# Patient Record
Sex: Male | Born: 1945
Health system: Southern US, Community
[De-identification: ages and names within clinical notes are randomized; demographics above are authoritative.]

## PROBLEM LIST (undated history)

## (undated) DIAGNOSIS — I10 Essential (primary) hypertension: Secondary | ICD-10-CM

## (undated) DIAGNOSIS — M1990 Unspecified osteoarthritis, unspecified site: Secondary | ICD-10-CM

## (undated) DIAGNOSIS — E119 Type 2 diabetes mellitus without complications: Secondary | ICD-10-CM

## (undated) DIAGNOSIS — Z87442 Personal history of urinary calculi: Secondary | ICD-10-CM

## (undated) DIAGNOSIS — R001 Bradycardia, unspecified: Secondary | ICD-10-CM

## (undated) DIAGNOSIS — C61 Malignant neoplasm of prostate: Secondary | ICD-10-CM

## (undated) HISTORY — PX: COLONOSCOPY: SHX174

## (undated) HISTORY — DX: Malignant neoplasm of prostate: C61

## (undated) HISTORY — DX: Essential (primary) hypertension: I10

## (undated) HISTORY — PX: KIDNEY STONE SURGERY: SHX686

---

## 2006-08-27 ENCOUNTER — Ambulatory Visit: Payer: Self-pay | Admitting: Cardiology

## 2006-08-27 ENCOUNTER — Ambulatory Visit: Payer: Self-pay | Admitting: Internal Medicine

## 2006-08-27 ENCOUNTER — Inpatient Hospital Stay (HOSPITAL_COMMUNITY): Admission: EM | Admit: 2006-08-27 | Discharge: 2006-08-31 | Payer: Self-pay | Admitting: Emergency Medicine

## 2006-08-29 ENCOUNTER — Ambulatory Visit: Payer: Self-pay | Admitting: Vascular Surgery

## 2006-10-03 ENCOUNTER — Ambulatory Visit: Payer: Self-pay | Admitting: Internal Medicine

## 2006-10-07 ENCOUNTER — Ambulatory Visit: Payer: Self-pay | Admitting: Physician Assistant

## 2007-05-14 ENCOUNTER — Ambulatory Visit: Payer: Self-pay | Admitting: Cardiology

## 2007-06-05 ENCOUNTER — Ambulatory Visit: Payer: Self-pay | Admitting: Cardiology

## 2008-11-13 DIAGNOSIS — R55 Syncope and collapse: Secondary | ICD-10-CM

## 2008-11-13 DIAGNOSIS — I10 Essential (primary) hypertension: Secondary | ICD-10-CM | POA: Insufficient documentation

## 2008-11-13 DIAGNOSIS — E785 Hyperlipidemia, unspecified: Secondary | ICD-10-CM | POA: Insufficient documentation

## 2008-11-14 ENCOUNTER — Encounter (INDEPENDENT_AMBULATORY_CARE_PROVIDER_SITE_OTHER): Payer: Self-pay | Admitting: *Deleted

## 2010-08-04 NOTE — Consult Note (Signed)
NAME:  MASSIMILIANO, ROHLEDER NO.:  [id number]   MEDICAL RECORD NO.:  [id number]          PATIENT TYPE:  INP   LOCATION:  4702                         FACILITY:  [facility]   PHYSICIAN:  Duke Salvia, MD, FACCDATE OF BIRTH:  [birth year]   DATE OF CONSULTATION:  08/29/2006  DATE OF DISCHARGE:                                 CONSULTATION   Thank you very much for asking [place] to see Earlene Plater Gabrielson in consultation  because of syncope.   Mr. Cowans is a 65 year old gentleman who when he awakened on Friday  night/Saturday morning rolled over in bed.  At this point, he became  severely vertiginous.  He sat up without resolution of symptoms.  He  awakened his wife.  He laid back down and then, according to his wife,  he passed out.  She described him as being pasty white and extremely  diaphoretic.  He awakened and lost consciousness on 3 subsequent  occasions, during the interlude of one of which EMS was called.  On EMS  arrival, his heart rate was about 35 beats per minute and sinus.  The  blood pressure was recorded as 96/60 and a pulse was recorded at 30,  although I do not have the strips for that.  He was nauseated.  He was  given Phenergan, and his symptoms gradually resolved.   The patient had a similar episode of vertigo about a year and a half  ago, again awakening him from sleep, rolling over with severe symptoms.  He laid his head over the side of the bed for about 10 of 15 minutes and  then his symptoms gradually abated.   He also has a prior history of syncope.  About 6 months ago, he fell  taking a step off of a ladder, tripped on a tire and landed on his back,  hitting this against a chain saw which was actually off, but in the  vicinity of prior kidney surgery.  He walked in the house and became  syncopal.  He was described as extremely pale and diaphoretic, he was  nauseated and actually had a seizure in the context of this episode.   The patient has no other  history of syncope.   He has no problems with exercise tolerance.  He denies prior coronary  artery disease, myocardial infarction.   PAST MEDICAL HISTORY:  Largely negative.  He has a history of gout.   PAST SURGICAL HISTORY:  1. Back surgery.  2. Kidney stone surgery.   MEDICATIONS:  He takes no regular medications.   ALLERGIES:  He is allergic to DILAUDID.  This allergy describes loss of  consciousness with intravenous administration.   SOCIAL HISTORY:  He is retired.  He lives with his wife.  He does not  use cigarettes or alcohol.   FAMILY HISTORY:  Notable for some with a pacemaker.   REVIEW OF SYSTEMS:  Noncontributory.   PHYSICAL EXAMINATION:  GENERAL:  He is an elderly Caucasian male  appearing his stated age of 65.  VITAL SIGNS:  His blood pressure is 140/91.  His pulse was 55.  He is  obese; weight is not, however, recorded.  HEENT:  No icterus, no xanthoma.  NECK:  The neck veins were flat.  The carotids were brisk and full  bilaterally without bruits.  BACK:  Without kyphosis or scoliosis.  LUNGS:  Clear.  HEART:  Heart sounds were regular without murmurs or gallops.  ABDOMEN:  Protuberant and soft.  Femoral pulses were not examined.  Distal pulses were intact with no clubbing, cyanosis or edema.  NEUROLOGICAL:  Exam was grossly normal, although he did have some  lateral gaze nystagmus.  SKIN:  Warm and dry.   ELECTROCARDIOGRAM:  Electrocardiogram dated August 28, 2006 demonstrated  sinus rhythm at 56 with intervals of 0.16, 0.09, 0.43.  The axis was 35  degrees.   The strips from the EMS are as recorded previously.   IMPRESSION:  1. Syncope associated with bradycardia, hypertension, nausea and      diaphoresis, likely neurally mediated.  2. Vertigo preceding #1.  3. Syncopal seizure episode in the fall in the setting of acute pain.  4. Nystagmus.   Mr. Clugston has vertigo followed by syncope.  The story sounds as if it  is positional vertigo with the  antecedent episode a year and a half ago.  I am not familiar with BPV to know whether this relative infrequency is  consistent with that diagnosis, although the history certainly suggests  this.  Further literature review this afternoon has failed to identify  papers which describe syncope as a concomitant occurrence with this type  of vertigo.  This leaves me a little bit uncertain as to the right  diagnosis, and I have asked neurology to come and assist and help [place]  sort out whether this is a peripheral or more central vertiginous  episode and whether that will help [place] understand the association of  these 2 occurrences.   For right now, we will (1) await neurological input, (2) the patient may  need an event recorder.   Thank you for the consultation.      Duke Salvia, MD, [facility]  Electronically Signed     SCK/MEDQ  D:  08/29/2006  T:  08/30/2006  Job:  [id number]   cc:   [facility] Internal Medicine  [facility]
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
## 2010-08-04 NOTE — Consult Note (Signed)
NAME:  Marcus Simpson, PASSAGE NO.:  [id number]   MEDICAL RECORD NO.:  [id number]          PATIENT TYPE:  INP   LOCATION:  4702                         FACILITY:  [facility]   PHYSICIAN:  Pramod P. Pearlean Brownie, MD    DATE OF BIRTH:  [birth year]   DATE OF CONSULTATION:  DATE OF DISCHARGE:                                 CONSULTATION   REASON FOR REFERRAL:  Vertigo and syncope.   HISTORY OF PRESENT ILLNESS:  Marcus Simpson is a 65-year Caucasian male who  had three brief episodes of loss of consciousness in the setting of  severe vertigo last Saturday.  The patient woke up from sleep and he was  lying on the left side and when he turned to the right he noticed severe  vertigo with nausea and he turned his head back to the left and passed  out briefly.  He was able to get up for a few seconds and vertigo was  gone.  He woke up his wife, but as he sat up and again turned his head  to the right, he again been brief loss of consciousness.  This happened  about three times and was related to head turning to the right on all  occasions.  This was associated with nausea.  He even threw up on the  way to the hospital.  His heart rate was found to be low in the 30s.  He  was treated with Phenergan and atropine, and the heart rate improved.  His nausea also improved.  He has remained stable since then and has had  no further episodes of vertigo, nausea or vomiting.  Denies any focal  extremity weakness, gait, balance problems, loss of vision or double  vision.  There is no prior known history of stroke, TIA or seizures.  He, however, does admit to a similar episode of positional vertigo about  the year ago which was not as severe.  He does have decreased hearing  bilaterally, but denies any tinnitus, recent ear infection, trauma or  injury or surgery.   PAST MEDICAL HISTORY:  1. Renal stones.  2. Back injury.   FAMILY HISTORY:  Positive for stroke in the father.  Several family  members have  history of cardiac arrhythmia and pacemaker.   ALLERGIES TO MEDICATIONS:  DILAUDID.   HOME MEDICATIONS:  None.   SOCIAL HISTORY:  He lives at home with his wife.  He used to smoke one  pack per day for 20 years and quit 25 years ago.   REVIEW OF SYSTEMS:  Not significant for recent fever, cough, chest pain,  shortness of breath, diarrhea or other illness.   PHYSICAL EXAMINATION:  GENERAL:  Reveals a pleasant middle-aged  Caucasian male who is at present not in distress.  VITAL SIGNS:  Afebrile, temperature 98, pulse rate 58 per minute,  regular sinus, respiratory 20, blood pressure 140/91.  Distal pulses  well felt.  HEENT:  Head is nontraumatic.  NECK:  Supple without bruit.  ENT :  Unremarkable.  CARDIAC:  No murmur or gallop.  LUNGS:  Clear to auscultation.  ABDOMEN:  Soft, nontender.  NEUROLOGIC:  The patient is awake, alert, oriented x3 with normal speech  and language function is no aphasia, plus or dysarthria.  Pupils equal  reactive.  Eye movements are full range without nystagmus.  Visual  fields adequate.  Face is symmetric.  Palatal movements normal.  Tongue  is midline.  Motor system exam reveals no upper extremity drift.  Deep  tendon reflexes are 1+ in upper extremities and depressed in lower  extremities.  Plantars are downgoing.  No sensory loss.  He has intact  finger-to-nose coordination.  He can walk with a steady gait including  tandem walking.   STUDIES:  On the head shaking test, he tested positive for dizziness,  but no objective nystagmus was seen.  Focal __________ stepping test was  positive with the patient clearly moving off base.  Hallpike maneuver  was not done.  Data reviewed.  CT scan of the head noncontrast study is  unremarkable without active abnormality.  Carotid ultrasound shows no  significant extracranial stenosis.  Lipid profile, hemoglobin A1c are  pending at this time.   IMPRESSION:  In 65 year old gentleman with recurrent episodes of  brief  loss of consciousness in the setting of severe positional vertigo.   DIFFERENTIAL DIAGNOSIS:  Vertebral basilar insufficiency versus severe  vertigo of labyrinth etiology.   PLAN:  I would recommend further evaluation with checking MRI scan of  the brain with MRA of the brain and neck.  Patient is extremely  claustrophobic and so we will sedate him prior to this.  Check  transcranial Doppler studies, fasting lipid profile, hemoglobin A1c and  homocysteine.  Vestibular stabilization exercises by physical therapy.  I will be happy to follow the patient in consult.  Kindly call for  questions.  I had a long discussion with the patient and his wife  regarding above evaluation, treatment and  answered questions.  Thank  you for this referral.           ______________________________  Sunny Schlein. Pearlean Brownie, MD     PPS/MEDQ  D:  08/29/2006  T:  08/30/2006  Job:  [id number]

## 2010-08-04 NOTE — Assessment & Plan Note (Signed)
[facility]                          [facility] OFFICE NOTE   Marcus Simpson, Marcus Simpson                    MRN:          [id number]  DATE:10/07/2006                            DOB:          01-Jan-1946    CARDIOLOGIST:  He will be new to Dr. Andee Lineman.   PRIMARY CARE PHYSICIAN:  Dr. Weyman Pedro.   HISTORY OF PRESENT ILLNESS:  Marcus Simpson is a very pleasant 65 year old  male patient who was seen recently by our team at [facility] in  [place] for syncope.  The patient had an extensive workup, including  evaluation by electrophysiology with Dr. Sherryl Manges as well as Dr.  Delia Heady for neurology.  He had an MRI/MRA of the head and neck.  He  also had carotid Dopplers.  He had an echocardiogram.  The  echocardiogram did reveal normal LV function with an EF of 60-65%.  His  MRI/MRA were both normal.  His carotid Dopplers revealed normal  vertebral flow as well as no ICA stenosis.   His EEG was also normal.   His blood work was normal.  He ruled out for myocardial infarction by  enzymes.  His lipid panel was optimal except for a low HDL of 27.  His  TSH was normal at 0.779.   He was set up for an event monitor as an outpatient.   According to the records I have received, it appears as though it was  hypothesized that Marcus Simpson had a severe episode of vertigo that  prompted him to pass out.  It seems as though this was a neurally  mediated event.  He was placed on Ramipril for high blood pressure,  which is a new diagnosis.  He was also placed on niacin for his low HDL.  He was apparently placed on several home exercises for habituation for  his vertigo.  He returns today for followup.  Upon review of his event  monitor, no significant brady arrhythmias have been noted.  No pauses  have been noted.  He has essentially been in sinus rhythm, sinus  tachycardia, and sinus bradycardia.  He denies any recurrent symptoms of  syncope.  He  denies any near syncope or dizziness.  He denies any chest  pain or shortness of breath.  He denies any palpitations.   MEDICATIONS:  1. Ramipril 2.5 mg daily.  2. Multivitamin daily.  3. Aspirin 81 mg daily.  4. Niacin.  5. Meclizine 25 mg p.r.n.  6. Fish oil.  7. B12.   PHYSICAL EXAMINATION:  He is a well-developed and well-nourished male in  no acute distress.  Blood pressure is 140/85 with a pulse of 57.  Weight 217 pounds.  HEENT:  Normal.  NECK:  Without JVD.  CARDIAC:  Normal S1 and S2.  Regular rate and rhythm.  LUNGS:  Clear to auscultation bilaterally.  ABDOMEN:  Soft and nontender.  EXTREMITIES:  Calves are soft and nontender.  NEUROLOGIC:  He is alert and oriented x3.  Cranial nerves II-XII are  grossly intact.   Electrocardiogram reveals a sinus bradycardia with a heart rate  of 54.  Normal axis.  No acute changes.   IMPRESSION:  1. A recent history of syncope.      a.     Most likely neurally mediated in the setting of severe       positional vertigo.  2. Sinus bradycardia.  3. Good left ventricular function.  4. Hypertension.  5. Family history of coronary artery disease.  6. Ex-smoker.  7. Dyslipidemia with low HDL.   PLAN:  Patient presents to the office today for followup on syncope.  All of his testing thus far has been fairly unremarkable.  It appears  that his most likely diagnosis is severe positional vertigo that  prompted a neurally mediated event.  His event monitor thus far has  shown no brady arrhythmias.  He will continue to finish out his event  monitor.  He does have some risk factors for coronary artery disease.  We will set him up for a routine exercise treadmill test to screen for  ischemic heart disease as well as to assess his chronotropic competence.  He will be seen back in this office in the next 4-6 weeks for routine  followup.  If he has any recurrent episodes of syncope or near syncope,  he is to notify us, and we will see him  back sooner.      Tereso Newcomer, PA-C  Electronically Signed      Learta Codding, MD,FACC  Electronically Signed   SW/MedQ  DD: 10/07/2006  DT: 10/07/2006  Job #: [id number]   cc:   Doreen Beam

## 2010-08-04 NOTE — Assessment & Plan Note (Signed)
[facility]                          [facility] NOTE   Marcus Simpson, Marcus Simpson                    MRN:          [id number]  DATE:06/05/2007                            DOB:          [birth year]    PRIMARY CARDIOLOGIST:  Dr. Lewayne Bunting.   REASON FOR VISIT:  Post hospital follow-up.   The patient returns to the clinic after recent brief hospitalization  here at [facility].  He was referred to [place] in consultation for evaluation  of near syncope.  This was in the setting of severe gastroenteritis,  with associated nausea, vomiting, diarrhea.  Of note, he also presented  with prior history of syncope, but this was felt to be secondary to  severe positional vertigo. Our records indicated that he had had a  negative extensive workup at [facility] in June 2008.   The patient also had known preserved left ventricular function by prior  echocardiography. Our recommendations, therefore, were to manage  conservatively, with admonition to not drive for 30 days, and to  consider further evaluation with an implantable loop recorder, in the  event that he were to have recurrent syncope.   The patient returns to clinic today reporting no recurrent symptoms of  dizziness.  Although he has meclizine to be used p.r.n. for vertigo, he  has not even had to use this.  He also seems to feel that this was all  secondary to his severe gastroenteritis.   The patient has no known history of coronary artery disease and denies  any exertional angina pectoris or dyspnea.  He does have history of  mixed dyslipidemia, with low HDL, and has not had a study done since  July 2008.  At that time, his HDL was 27 with a total cholesterol of  150, triglycerides of 120 and LDL of 99.   Patient's previous ischemic workup was a negative adequate GXT  Cardiolite in 2004.   CURRENT MEDICATIONS:  Aspirin 81 daily, Norvasc 5 daily, , fish oil 1000  daily.   PHYSICAL EXAM:  Blood  pressure 144/83, pulse 69, regular weight 220.  GENERAL:  65 year old male sitting upright in no distress.  HEENT:  Normocephalic, atraumatic.  NECK:  Palpable bilateral pulses, without bruits; no JVD.  LUNGS:  Clear to auscultation all fields.  HEART:  Regular rhythm (S1, S2), no significant murmurs  ABDOMEN:  Soft, nontender.  EXTREMITIES:  Palpable pulses without edema.  NEURO:  No focal deficit.   IMPRESSION:  1. Status post near syncope.      a.     In setting of severe gastroenteritis.  2. History of syncope      a.     Felt secondary to severe positional vertigo      b.     Negative extensive workup at [facility], June 2008.  3. Preserved left ventricular function.  4. Dyslipidemia/low HDL, on fish oil.  5. Hypertension.  6. Elevated liver enzymes.      a.     Most likely secondary to gastroenteritis.   PLAN:  1. Follow-up blood work with a surveillance fasting  lipid profile, as      well as a repeat CMET  for reassessment of elevated liver enzymes.  2. Continue current medication regimen for now.  I did, however,      consider the possibility of increasing Norvasc to 10 mg daily for      better blood pressure control.  For the time-being, however, we      will continue closely monitoring this and reconsider this when the      patient returns for scheduled follow-up in 3 months.      Gene Serpe, PA-C  Electronically Signed      Learta Codding, MD,FACC  Electronically Signed   GS/MedQ  DD: 06/05/2007  DT: 06/05/2007  Job #: [id number]   cc:   Doreen Beam, MD

## 2010-08-04 NOTE — Procedures (Signed)
EEG NUMBER:  [id number]   INDICATION FOR PROCEDURE:  This is an [facility] patient referred for  this EEG due to repeated episodes of extreme dizziness, weakness and  vomiting as well as passing out for about 30 seconds.  This was  described as being the 4th episode of syncope/possible seizure.   TECHNICAL PROTOCOL:  This is a routine study performed with a non-  portable unit; it includes a photic stimulation maneuver.  No  hyperventilation maneuver was performed.  The patient is described as  right-handed, awake and drowsy.   MEDICATIONS:  Lovenox, Protonix, Phenergan, Ambien, Reglan, meclizine  and Ventolin.   TECHNICAL COMPONENT:  A 16-channel EEG recording was performed with 1  channel representing heart rate and rhythm exclusively.  The EEG is  reviewed in different montages.   DESCRIPTION:  A 9-Hz posterior-dominant rhythm emits bilaterally  symmetrically from both posterior hemispheres and is promptly attenuated  with eye opening.  The EKG remains in normal sinus rhythm throughout  this recording.  There is little motion artifact noted.  Photic  stimulation as performed showed flash rates of 15 and 17 Hz did lead to  photic entrainment, again without epileptiform discharges.  Hyperventilation was deferred.  The patient, who was awake at the  beginning of the recording, became progressively drowsy as the recording  went on; this was indicated by central and temporal increased amplitude  and slowing.  The patient seemed to have reached non-REM sleep stage I  for a period of time.  No epileptiform activity was noticed, no focal  slowing was seen and photic stimulation did not lead to epileptiform  discharges.   CONCLUSION:  This was a normal EEG for the patient's conscious state and  age.      Melvyn Novas, M.D.  Electronically Signed     HQ:IONG  D:  08/30/2006 16:39:52  T:  08/31/2006 08:58:38  Job #:  [id number]   cc:   [facility]

## 2011-01-07 LAB — BASIC METABOLIC PANEL
BUN: 12
Calcium: 8.9
Creatinine, Ser: 1.08
GFR calc Af Amer: >60

## 2011-01-07 LAB — CBC
HCT: 38.8 — ABNORMAL LOW
Hemoglobin: 13.5
MCHC: 34.7
MCV: 90.9
Platelets: 232
RBC: 4.27
RDW: 12.7
WBC: 10.7 — ABNORMAL HIGH

## 2011-01-07 LAB — CARDIAC PANEL(CRET KIN+CKTOT+MB+TROPI)
CK, MB: 1.4
CK, MB: 1.8
Relative Index: 1.7
Relative Index: INVALID
Total CK: 109
Total CK: 89
Troponin I: 0.01
Troponin I: 0.02

## 2011-01-07 LAB — DIFFERENTIAL
Basophils Absolute: 0
Basophils Relative: 0
Eosinophils Absolute: 0.2
Eosinophils Relative: 2
Lymphocytes Relative: 14
Lymphs Abs: 1.4
Monocytes Absolute: 0.8 — ABNORMAL HIGH
Monocytes Relative: 8
Neutro Abs: 8.2 — ABNORMAL HIGH
Neutrophils Relative %: 77

## 2011-01-07 LAB — PROTIME-INR
INR: 1
Prothrombin Time: 13.8

## 2011-01-07 LAB — POCT CARDIAC MARKERS
CKMB, poc: 1.1
CKMB, poc: 1.2
CKMB, poc: 1.6
Myoglobin, poc: 73.1
Myoglobin, poc: 88.6
Myoglobin, poc: 94.3
Operator id: 284251
Operator id: 284251
Operator id: 284251
Troponin i, poc: <0.05
Troponin i, poc: <0.05
Troponin i, poc: <0.05

## 2011-01-07 LAB — BASIC METABOLIC PANEL WITH GFR
CO2: 25
Chloride: 107
GFR calc non Af Amer: >60
Glucose, Bld: 128 — ABNORMAL HIGH
Potassium: 3.6
Sodium: 139

## 2011-01-07 LAB — LIPID PANEL
Cholesterol: 150
HDL: 27 — ABNORMAL LOW
LDL Cholesterol: 99
Total CHOL/HDL Ratio: 5.6
Triglycerides: 120
VLDL: 24

## 2011-01-07 LAB — CK TOTAL AND CKMB (NOT AT ARMC)
CK, MB: 2.2
Relative Index: 1.7
Total CK: 127

## 2011-01-07 LAB — HEMOGLOBIN A1C: Hgb A1c MFr Bld: 6.1

## 2011-01-07 LAB — APTT: aPTT: 26

## 2011-01-07 LAB — TSH: TSH: 0.779

## 2011-01-07 LAB — TROPONIN I: Troponin I: 0.01

## 2014-12-10 ENCOUNTER — Ambulatory Visit (INDEPENDENT_AMBULATORY_CARE_PROVIDER_SITE_OTHER): Payer: Medicare Other | Admitting: Urology

## 2014-12-10 DIAGNOSIS — R972 Elevated prostate specific antigen [PSA]: Secondary | ICD-10-CM

## 2014-12-10 DIAGNOSIS — N200 Calculus of kidney: Secondary | ICD-10-CM

## 2014-12-17 ENCOUNTER — Other Ambulatory Visit: Payer: Self-pay | Admitting: Urology

## 2014-12-17 DIAGNOSIS — R972 Elevated prostate specific antigen [PSA]: Secondary | ICD-10-CM

## 2014-12-24 ENCOUNTER — Ambulatory Visit (HOSPITAL_COMMUNITY)
Admission: RE | Admit: 2014-12-24 | Discharge: 2014-12-24 | Disposition: A | Discharge status: Home / Self Care | Payer: Medicare Other | Source: Ambulatory Visit | Attending: Urology | Admitting: Urology

## 2014-12-24 DIAGNOSIS — Z8042 Family history of malignant neoplasm of prostate: Secondary | ICD-10-CM | POA: Diagnosis not present

## 2014-12-24 DIAGNOSIS — C61 Malignant neoplasm of prostate: Secondary | ICD-10-CM | POA: Diagnosis not present

## 2014-12-24 DIAGNOSIS — R972 Elevated prostate specific antigen [PSA]: Secondary | ICD-10-CM

## 2014-12-24 MED ORDER — GENTAMICIN SULFATE 40 MG/ML IJ SOLN
INTRAMUSCULAR | Status: AC
  Administered 2014-12-24: 160 mg via INTRAMUSCULAR
  Filled 2014-12-24: qty 4

## 2014-12-24 MED ORDER — LIDOCAINE HCL (PF) 2 % IJ SOLN
INTRAMUSCULAR | Status: AC
  Administered 2014-12-24: 10 mL
  Filled 2014-12-24: qty 10

## 2014-12-24 MED ORDER — GENTAMICIN SULFATE 40 MG/ML IJ SOLN
160.0000 mg | Freq: Once | INTRAMUSCULAR | Status: AC
  Administered 2014-12-24: 160 mg via INTRAMUSCULAR

## 2014-12-24 MED ORDER — LIDOCAINE HCL (PF) 2 % IJ SOLN
10.0000 mL | Freq: Once | INTRAMUSCULAR | Status: AC
  Administered 2014-12-24: 10 mL

## 2014-12-24 NOTE — Discharge Instructions (Signed)
Transrectal Ultrasound-Guided Biopsy °A transrectal ultrasound-guided biopsy is a procedure to remove samples of tissue from your prostate using ultrasound images to guide the procedure. The procedure is usually done to evaluate the prostate gland of men who have an elevated prostate-specific antigen (PSA). PSA is a blood test to screen for prostate cancer. The biopsy samples are taken to check for prostate cancer.  °LET YOUR HEALTH CARE PROVIDER KNOW ABOUT: °· Any allergies you have. °· All medicines you are taking, including vitamins, herbs, eye drops, creams, and over-the-counter medicines. °· Previous problems you or members of your family have had with the use of anesthetics. °· Any blood disorders you have. °· Previous surgeries you have had. °· Medical conditions you have. °RISKS AND COMPLICATIONS °Generally, this is a safe procedure. However, as with any procedure, problems can occur. Possible problems include: °· Infection of your prostate. °· Bleeding from your rectum or blood in your urine. °· Difficulty urinating. °· Nerve damage (this is usually temporary). °· Damage to surrounding structures such as blood vessels, organs, and muscles, which would require other procedures. °BEFORE THE PROCEDURE °· Do not eat or drink anything after midnight on the night before the procedure or as directed by your health care provider. °· Take medicines only as directed by your health care provider. °· Your health care provider may have you stop taking certain medicines 5-7 days before the procedure. °· You will be given an enema before the procedure. During an enema, a liquid is injected into your rectum to clear out waste. °· You may have lab tests the day of your procedure.   °· Plan to have someone take you home after the procedure. °PROCEDURE  °· You will be given medicine to help you relax (sedative) before the procedure. An IV tube will be inserted into one of your veins and used to give fluids and  medicine. °· You will be given antibiotic medicine to reduce the risk of an infection. °· You will be placed on your side for the procedure. °· A probe with lubricated gel will be placed into your rectum, and images will be taken of your prostate and surrounding structures. °· Numbing medicine will be injected into the prostate before the biopsy samples are taken. °· A biopsy needle will then be inserted and guided to your prostate with the use of the ultrasound images. °· Samples of prostate tissue will be taken, and the needle will then be removed. °· The biopsy samples will be sent to a lab to be analyzed. Results are usually back in 2-3 days. °AFTER THE PROCEDURE °· You will be taken to a recovery area where you will be monitored. °· You may have some discomfort in the rectal area. You will be given pain medicines to control this. °· You may be allowed to go home the same day, or you may need to stay in the hospital overnight. °Document Released: 07/23/2013 Document Reviewed: 10/25/2012 °Exit[employer]® Patient Information ©2015 [employer]. This information is not intended to replace advice given to you by your health care provider. Make sure you discuss any questions you have with your health care provider. ° °

## 2015-02-26 ENCOUNTER — Encounter (INDEPENDENT_AMBULATORY_CARE_PROVIDER_SITE_OTHER): Payer: Self-pay | Admitting: *Deleted

## 2015-03-10 ENCOUNTER — Other Ambulatory Visit (INDEPENDENT_AMBULATORY_CARE_PROVIDER_SITE_OTHER): Payer: Self-pay | Admitting: Internal Medicine

## 2015-03-10 ENCOUNTER — Encounter (INDEPENDENT_AMBULATORY_CARE_PROVIDER_SITE_OTHER): Payer: Self-pay

## 2015-03-10 ENCOUNTER — Ambulatory Visit (INDEPENDENT_AMBULATORY_CARE_PROVIDER_SITE_OTHER): Payer: Medicare Other | Admitting: Internal Medicine

## 2015-03-10 ENCOUNTER — Encounter (INDEPENDENT_AMBULATORY_CARE_PROVIDER_SITE_OTHER): Payer: Self-pay | Admitting: Internal Medicine

## 2015-03-10 ENCOUNTER — Encounter (INDEPENDENT_AMBULATORY_CARE_PROVIDER_SITE_OTHER): Payer: Self-pay | Admitting: *Deleted

## 2015-03-10 VITALS — BP 112/60 | HR 64 | Temp 98.7°F | Ht 71.0 in | Wt 206.1 lb

## 2015-03-10 DIAGNOSIS — C61 Malignant neoplasm of prostate: Secondary | ICD-10-CM | POA: Insufficient documentation

## 2015-03-10 DIAGNOSIS — R197 Diarrhea, unspecified: Secondary | ICD-10-CM | POA: Diagnosis not present

## 2015-03-10 NOTE — Patient Instructions (Signed)
Imodium one in am and one in the pm. May take up to three times a day. May need a sigmoidoscopy if stool studies are negative.

## 2015-03-10 NOTE — Progress Notes (Signed)
   Subjective:    Patient ID: Marcus Simpson, male    DOB: Oct 10, 1945, 69 y.o.   MRN: [id number]  HPIReferred to our office by Dr. Quintin Alto for chronic diarrhea. He  Apparently has had diarrhea x 4 months. He is having 7-8 stools a day. He says as soon as he eats, he has to go to the [place]. There has been no rectal bleeding. Stools are always loose. Up until 4 months ago, his stools were normal. He has lost about 15 pounds over a 4 month period. Recently had a colonoscopy with Dr. Ladona Horns (general surgery). Appetite has been okay. There is no abdominal pain.  No rectal pain.  He tells me stools studies were negative. (Stool studies received and are negative 11/26/2014)  02/06/2015 Dr. Ladona Horns. Chronic diarrhea: colonoscopy: Two polyps were found in the rectum. Two polyps were found in the rt colon.  Colon biopsy 120 cm: localized adenomatous change in colonic epithelium. Colon 130 cm: tubular adenoma. Colon rectum biopsy: hyperplastic polyp.   New diagnosis of prostate cancer in October. He has not had radiation or hormonal therapy  01/17/2015 WBC 7.8, H and H 13.7 and 40.8, MCV 91, glucose 113, BUn 10, Creatinine 1.01, total  bili 1.4, LP 94, AST 22, ALT 15.  11/24/2014 CT abdomen/pelvis with CM: abdominal pain and diarrhea: Stable stranding and small lymph nodes at the base of the small bowel mesenteries. Similar to prior study. No focal small bowel lesion or obstruction. Multiple bilateral rena calculi are again noted without obstruction. Stable appearance of bilateral renal cysts. Advanced degenerative changes in the lumbar spine are stable.    Review of Systems Past Medical History  Diagnosis Date  . Hypertension   . Prostate cancer [facility])     Past Surgical History  Procedure Laterality Date  . Kidney stone surgery      Allergies  Allergen Reactions  . Dilaudid [Hydromorphone Hcl] Anaphylaxis    No current outpatient prescriptions on file prior to visit.   No current  facility-administered medications on file prior to visit.   Current Outpatient Prescriptions  Medication Sig Dispense Refill  . amLODipine (NORVASC) 5 MG tablet Take 5 mg by mouth daily.    Marland Kitchen aspirin 81 MG tablet Take 81 mg by mouth daily.    . Multiple Vitamin (MULTIVITAMIN) tablet Take 1 tablet by mouth daily.    . vitamin B-12 (CYANOCOBALAMIN) 1000 MCG tablet Take 500 mcg by mouth daily.     No current facility-administered medications for this visit.        Objective:   Physical Exam Blood pressure 112/60, pulse 64, temperature 98.7 F (37.1 C), height 5\' 11"  (1.803 m), weight 206 lb 1.6 oz (93.486 kg). Alert and oriented. Skin warm and dry. Oral mucosa is moist.   . Sclera anicteric, conjunctivae is pink. Thyroid not enlarged. No cervical lymphadenopathy. Lungs clear. Heart regular rate and rhythm.  Abdomen is soft. Bowel sounds are positive. No hepatomegaly. No abdominal masses felt. No tenderness.  No edema to lower extremities.        Assessment & Plan:  Chronic diarrhea.  Am going to get stool studies from Dr. Quintin Alto. ( I discussed with Dr. Laural Golden).  Imodium twice a day. May take up to three a day. Sigmoidoscopy.  Stool studies were negative.

## 2015-03-31 DIAGNOSIS — J019 Acute sinusitis, unspecified: Secondary | ICD-10-CM | POA: Diagnosis not present

## 2015-04-01 ENCOUNTER — Other Ambulatory Visit (INDEPENDENT_AMBULATORY_CARE_PROVIDER_SITE_OTHER): Payer: Self-pay | Admitting: Internal Medicine

## 2015-04-01 DIAGNOSIS — R197 Diarrhea, unspecified: Secondary | ICD-10-CM

## 2015-04-02 ENCOUNTER — Encounter (HOSPITAL_COMMUNITY): Payer: Self-pay | Admitting: *Deleted

## 2015-04-02 ENCOUNTER — Encounter (HOSPITAL_COMMUNITY)
Admission: RE | Disposition: A | Discharge status: Home Health | Payer: Self-pay | Source: Ambulatory Visit | Attending: Internal Medicine

## 2015-04-02 ENCOUNTER — Ambulatory Visit (HOSPITAL_COMMUNITY)
Admission: RE | Admit: 2015-04-02 | Discharge: 2015-04-02 | Disposition: A | Discharge status: Home Health | Payer: Medicare Other | Source: Ambulatory Visit | Attending: Internal Medicine | Admitting: Internal Medicine

## 2015-04-02 DIAGNOSIS — Z86010 Personal history of colonic polyps: Secondary | ICD-10-CM | POA: Insufficient documentation

## 2015-04-02 DIAGNOSIS — Z79899 Other long term (current) drug therapy: Secondary | ICD-10-CM | POA: Diagnosis not present

## 2015-04-02 DIAGNOSIS — Z7982 Long term (current) use of aspirin: Secondary | ICD-10-CM | POA: Insufficient documentation

## 2015-04-02 DIAGNOSIS — I10 Essential (primary) hypertension: Secondary | ICD-10-CM | POA: Insufficient documentation

## 2015-04-02 DIAGNOSIS — R197 Diarrhea, unspecified: Secondary | ICD-10-CM | POA: Diagnosis not present

## 2015-04-02 DIAGNOSIS — K644 Residual hemorrhoidal skin tags: Secondary | ICD-10-CM | POA: Diagnosis not present

## 2015-04-02 DIAGNOSIS — K6389 Other specified diseases of intestine: Secondary | ICD-10-CM | POA: Diagnosis not present

## 2015-04-02 DIAGNOSIS — K649 Unspecified hemorrhoids: Secondary | ICD-10-CM | POA: Diagnosis not present

## 2015-04-02 DIAGNOSIS — C61 Malignant neoplasm of prostate: Secondary | ICD-10-CM | POA: Insufficient documentation

## 2015-04-02 HISTORY — PX: FLEXIBLE SIGMOIDOSCOPY: SHX5431

## 2015-04-02 SURGERY — SIGMOIDOSCOPY, FLEXIBLE
Anesthesia: Moderate Sedation

## 2015-04-02 MED ORDER — MEPERIDINE HCL 25 MG/ML IJ SOLN
INTRAMUSCULAR | Status: DC | PRN
  Administered 2015-04-02 (×2): 25 mg via INTRAVENOUS

## 2015-04-02 MED ORDER — STERILE WATER FOR IRRIGATION IR SOLN
Status: DC | PRN
  Administered 2015-04-02: 09:00:00

## 2015-04-02 MED ORDER — MIDAZOLAM HCL 5 MG/5ML IJ SOLN
INTRAMUSCULAR | Status: DC | PRN
  Administered 2015-04-02 (×2): 2 mg via INTRAVENOUS

## 2015-04-02 MED ORDER — MIDAZOLAM HCL 5 MG/5ML IJ SOLN
INTRAMUSCULAR | Status: AC
  Filled 2015-04-02: qty 10

## 2015-04-02 MED ORDER — MEPERIDINE HCL 50 MG/ML IJ SOLN
INTRAMUSCULAR | Status: AC
  Filled 2015-04-02: qty 1

## 2015-04-02 MED ORDER — LOPERAMIDE HCL 2 MG PO CAPS: 2.0000 mg | ORAL_CAPSULE | Freq: Two times a day (BID) | ORAL | Status: DC

## 2015-04-02 MED ORDER — SODIUM CHLORIDE 0.9 % IV SOLN
INTRAVENOUS | Status: DC
  Administered 2015-04-02: 1000 mL via INTRAVENOUS

## 2015-04-02 NOTE — H&P (Signed)
Marcus Simpson is an 70 y.o. male.   Chief Complaint: Patient is here for flexible sigmoidoscopy. HPI: Patient is 70 year old Caucasian male who presents with 5 month history of diarrhea. He is having as many as 7 to 8 stools per day. All of his stools are loose. They're moderate in volume. He denies cramping melena or rectal bleeding. He has had multiple accidents. He is also having nocturnal bowel movements. He has lost 25 pounds since diarrhea started. [facility] and these were [facility]. He had colonoscopy on 02/06/2015 by Dr. Ladona Horns and 2 small polyps removed. One polyp was tubular adenoma in the rectal polyp was hyperplastic. There is no history of recent travel abroad or antibiotic use. He was recently diagnosed with prostate CA and he is inclined to undergo surgery.  Review of records reveal that he had EGD and colonoscopy in June 2012 by Dr. Paulita Fujita in [place]. Sigmoid colon biopsy was negative for microscopic colitis. Patient recalls that he had diarrhea which resolved. In the meantime he is also had intermittent constipation but not in the last 5 months.  Past Medical History  Diagnosis Date  . Hypertension   . Prostate cancer [facility])     Past Surgical History  Procedure Laterality Date  . Kidney stone surgery    . Colonoscopy      History reviewed. No pertinent family history. Social History:  reports that he has quit smoking. He does not have any smokeless tobacco history on file. He reports that he does not drink alcohol or use illicit drugs.  Allergies:  Allergies  Allergen Reactions  . Dilaudid [Hydromorphone Hcl] Anaphylaxis    Medications Prior to Admission  Medication Sig Dispense Refill  . azithromycin (ZITHROMAX) 250 MG tablet Take 250 mg by mouth daily.    Marland Kitchen amLODipine (NORVASC) 5 MG tablet Take 5 mg by mouth daily.    Marland Kitchen aspirin 81 MG tablet Take 81 mg by mouth daily.    . Multiple Vitamin (MULTIVITAMIN) tablet Take 1 tablet by mouth  daily.    . vitamin B-12 (CYANOCOBALAMIN) 1000 MCG tablet Take 500 mcg by mouth daily.      No results found for this or any previous visit (from the past 48 hour(s)). No results found.  ROS  Blood pressure 165/82, pulse 61, temperature 98.4 F (36.9 C), temperature source Oral, resp. rate 13, height 5\' 11"  (1.803 m), weight 205 lb (92.987 kg), SpO2 97 %. Physical Exam  Constitutional: He appears well-developed and well-nourished.  HENT:  Mouth/Throat: Oropharynx is clear and moist.  Eyes: Conjunctivae are normal. No scleral icterus.  Neck: No thyromegaly present.  Cardiovascular: Normal rate, regular rhythm and normal heart sounds.   No murmur heard. Respiratory: Effort normal and breath sounds normal.  GI: Soft. He exhibits no distension and no mass. There is no tenderness.  Musculoskeletal: He exhibits no edema.  Lymphadenopathy:    He has no cervical adenopathy.  Neurological: He is alert.  Skin: Skin is warm and dry.     Assessment/Plan Chronic diarrhea and negative stool studies. Diagnostic flexible sigmoidoscopy with IV sedation.  Jaylee Freeze U 04/02/2015, 8:27 AM

## 2015-04-02 NOTE — Discharge Instructions (Signed)
Resume usual medications and diet. Imodium OTC 2 mg by mouth before breakfast and lunch daily. No driving for 24 hours. Physician will call with biopsy results and further recommendations.  Colonoscopy, Care After Refer to this sheet in the next few weeks. These instructions provide you with information on caring for yourself after your procedure. Your health care provider may also give you more specific instructions. Your treatment has been planned according to current medical practices, but problems sometimes occur. Call your health care provider if you have any problems or questions after your procedure. WHAT TO EXPECT AFTER THE PROCEDURE  After your procedure, it is typical to have the following:  A small amount of blood in your stool.  Moderate amounts of gas and mild abdominal cramping or bloating. HOME CARE INSTRUCTIONS  Do not drive, operate machinery, or sign important documents for 24 hours.  You may shower and resume your regular physical activities, but move at a slower pace for the first 24 hours.  Take frequent rest periods for the first 24 hours.  Walk around or put a warm pack on your abdomen to help reduce abdominal cramping and bloating.  Drink enough fluids to keep your urine clear or pale yellow.  You may resume your normal diet as instructed by your health care provider. Avoid heavy or fried foods that are hard to digest.  Avoid drinking alcohol for 24 hours or as instructed by your health care provider.  Only take over-the-counter or prescription medicines as directed by your health care provider.  If a tissue sample (biopsy) was taken during your procedure:  Do not take aspirin or blood thinners for 7 days, or as instructed by your health care provider.  Do not drink alcohol for 7 days, or as instructed by your health care provider.  Eat soft foods for the first 24 hours. SEEK MEDICAL CARE IF: You have persistent spotting of blood in your stool 2-3 days  after the procedure. SEEK IMMEDIATE MEDICAL CARE IF:  You have more than a small spotting of blood in your stool.  You pass large blood clots in your stool.  Your abdomen is swollen (distended).  You have nausea or vomiting.  You have a fever.  You have increasing abdominal pain that is not relieved with medicine.   This information is not intended to replace advice given to you by your health care provider. Make sure you discuss any questions you have with your health care provider.

## 2015-04-02 NOTE — Op Note (Addendum)
FLEXIBLE SIGMOIDOSCOPY  PROCEDURE REPORT  PATIENT:  Marcus Simpson  MR#:  [id number] Birthdate:  [birth year], 70 y.o., male Endoscopist:  Dr. Rogene Houston, MD Referred By:  Dr. Manon Hilding, MD Procedure Date: 04/02/2015  Procedure:  Flexible sigmoidoscopy  Indications:  Patient is 70 year old Caucasian male with 5 month history of diarrhea and negative stool studies. He had colonoscopy on 02/05/2015 with removal of 2 small polyps and one was tubular adenoma. Colonoscopy was performed at [facility] in [place], [place]. He is undergoing diagnostic flexible sigmoidoscopy.  Informed Consent:  The procedure and risks were reviewed with the patient and informed consent was obtained.  Medications:  Demerol 50 mg IV Versed 4 mg IV Moderate sedation started @8 :33 AM procedure ended-scope out @8 :50 AM.   Description of procedure:  After a digital rectal exam was performed, that colonoscope was advanced from the anus through the rectum and colon to the area descending colon.. From this level scope was slowly and cautiously withdrawn. The mucosal surfaces were carefully surveyed utilizing scope tip to flexion to facilitate fold flattening as needed. The scope was pulled down into the rectum where a thorough exam including retroflexion was performed.  Findings:   Prep excellent. Mucosa of descending colon was normal. Random biopsies taken. Multiple small slightly pale areas noted with erythematous rim at sigmoid and descending colon. Multiple biopsies taken from mucosa of sigmoid colon. Rectal mucosa revealed multiple 2-3 mm areas with erythematous ring. Small hemorrhoids noted below the dentate line.   Therapeutic/Diagnostic Maneuvers Performed:  See above  Complications:  None  EBL: Minimal  Impression:  Normal mucosa of descending colon. Mucosal changes involving sigmoid colon and rectum possibly secondary to lymphoid hyperplasia. Biopsies taken from descending and sigmoid colon and submitted  separately. Small external hemorrhoids.  Recommendations:  Standard instructions given. Imodium OTC 2 mg by mouth twice a day. I will contact patient with biopsy results and further recommendations.  REHMAN,NAJEEB U  04/02/2015 8:58 AM  CC: Dr. Cleaster [facility] & Dr. No ref. provider found

## 2015-04-04 ENCOUNTER — Encounter (HOSPITAL_COMMUNITY): Payer: Self-pay | Admitting: Internal Medicine

## 2015-04-07 ENCOUNTER — Other Ambulatory Visit (INDEPENDENT_AMBULATORY_CARE_PROVIDER_SITE_OTHER): Payer: Self-pay | Admitting: *Deleted

## 2015-04-07 DIAGNOSIS — R197 Diarrhea, unspecified: Secondary | ICD-10-CM

## 2015-04-10 ENCOUNTER — Ambulatory Visit (HOSPITAL_COMMUNITY)
Admission: RE | Admit: 2015-04-10 | Discharge: 2015-04-10 | Disposition: A | Discharge status: Home / Self Care | Payer: Medicare Other | Source: Ambulatory Visit | Attending: Internal Medicine | Admitting: Internal Medicine

## 2015-04-10 DIAGNOSIS — N200 Calculus of kidney: Secondary | ICD-10-CM | POA: Diagnosis not present

## 2015-04-10 DIAGNOSIS — R197 Diarrhea, unspecified: Secondary | ICD-10-CM | POA: Diagnosis not present

## 2015-04-16 ENCOUNTER — Telehealth (INDEPENDENT_AMBULATORY_CARE_PROVIDER_SITE_OTHER): Payer: Self-pay | Admitting: *Deleted

## 2015-04-16 DIAGNOSIS — R197 Diarrhea, unspecified: Secondary | ICD-10-CM | POA: Diagnosis not present

## 2015-04-16 DIAGNOSIS — K529 Noninfective gastroenteritis and colitis, unspecified: Secondary | ICD-10-CM | POA: Diagnosis not present

## 2015-04-16 DIAGNOSIS — C61 Malignant neoplasm of prostate: Secondary | ICD-10-CM | POA: Diagnosis not present

## 2015-04-16 NOTE — Telephone Encounter (Signed)
Per Dr.Rehman the patient will need to have labs drawn. 

## 2015-04-17 LAB — VITAMIN B12: VITAMIN B 12: 1832 pg/mL — AB (ref 211–911)

## 2015-04-17 LAB — FOLATE

## 2015-04-18 LAB — HIGH SENSITIVITY CRP: CRP, High Sensitivity: 2.3 mg/L

## 2015-05-16 DIAGNOSIS — R197 Diarrhea, unspecified: Secondary | ICD-10-CM | POA: Diagnosis not present

## 2015-05-16 DIAGNOSIS — E7800 Pure hypercholesterolemia, unspecified: Secondary | ICD-10-CM | POA: Diagnosis not present

## 2015-05-16 DIAGNOSIS — I10 Essential (primary) hypertension: Secondary | ICD-10-CM | POA: Diagnosis not present

## 2015-05-16 DIAGNOSIS — R7302 Impaired glucose tolerance (oral): Secondary | ICD-10-CM | POA: Diagnosis not present

## 2015-05-16 DIAGNOSIS — M1990 Unspecified osteoarthritis, unspecified site: Secondary | ICD-10-CM | POA: Diagnosis not present

## 2015-08-20 DIAGNOSIS — M1990 Unspecified osteoarthritis, unspecified site: Secondary | ICD-10-CM | POA: Diagnosis not present

## 2015-08-20 DIAGNOSIS — I10 Essential (primary) hypertension: Secondary | ICD-10-CM | POA: Diagnosis not present

## 2015-08-20 DIAGNOSIS — M25562 Pain in left knee: Secondary | ICD-10-CM | POA: Diagnosis not present

## 2015-08-20 DIAGNOSIS — C61 Malignant neoplasm of prostate: Secondary | ICD-10-CM | POA: Diagnosis not present

## 2015-08-20 DIAGNOSIS — R7302 Impaired glucose tolerance (oral): Secondary | ICD-10-CM | POA: Diagnosis not present

## 2015-08-20 DIAGNOSIS — R197 Diarrhea, unspecified: Secondary | ICD-10-CM | POA: Diagnosis not present

## 2015-08-20 DIAGNOSIS — R202 Paresthesia of skin: Secondary | ICD-10-CM | POA: Diagnosis not present

## 2015-08-25 DIAGNOSIS — H2513 Age-related nuclear cataract, bilateral: Secondary | ICD-10-CM | POA: Diagnosis not present

## 2015-08-25 DIAGNOSIS — H40033 Anatomical narrow angle, bilateral: Secondary | ICD-10-CM | POA: Diagnosis not present

## 2015-09-08 DIAGNOSIS — J069 Acute upper respiratory infection, unspecified: Secondary | ICD-10-CM | POA: Diagnosis not present

## 2015-09-17 DIAGNOSIS — R509 Fever, unspecified: Secondary | ICD-10-CM | POA: Diagnosis not present

## 2015-09-17 DIAGNOSIS — S30861A Insect bite (nonvenomous) of abdominal wall, initial encounter: Secondary | ICD-10-CM | POA: Diagnosis not present

## 2015-11-28 ENCOUNTER — Encounter (INDEPENDENT_AMBULATORY_CARE_PROVIDER_SITE_OTHER): Payer: Self-pay

## 2016-04-20 ENCOUNTER — Ambulatory Visit (INDEPENDENT_AMBULATORY_CARE_PROVIDER_SITE_OTHER): Payer: Medicare Other | Admitting: Urology

## 2016-04-20 DIAGNOSIS — C61 Malignant neoplasm of prostate: Secondary | ICD-10-CM | POA: Diagnosis not present

## 2016-11-24 ENCOUNTER — Other Ambulatory Visit: Payer: Self-pay | Admitting: Urology

## 2016-11-24 DIAGNOSIS — C61 Malignant neoplasm of prostate: Secondary | ICD-10-CM

## 2016-12-14 ENCOUNTER — Ambulatory Visit (HOSPITAL_COMMUNITY)
Admission: RE | Admit: 2016-12-14 | Discharge: 2016-12-14 | Disposition: A | Discharge status: Home / Self Care | Payer: Medicare Other | Source: Ambulatory Visit | Attending: Urology | Admitting: Urology

## 2016-12-14 DIAGNOSIS — C61 Malignant neoplasm of prostate: Secondary | ICD-10-CM | POA: Diagnosis present

## 2016-12-14 LAB — POCT I-STAT CREATININE: CREATININE: 0.9 mg/dL (ref 0.61–1.24)

## 2016-12-14 MED ORDER — GADOBENATE DIMEGLUMINE 529 MG/ML IV SOLN
20.0000 mL | Freq: Once | INTRAVENOUS | Status: AC | PRN
  Administered 2016-12-14: 19 mL via INTRAVENOUS

## 2017-03-01 ENCOUNTER — Ambulatory Visit: Payer: Self-pay | Admitting: Orthopedic Surgery

## 2017-04-13 IMAGING — CR DG SMALL BOWEL
2 series · 2 of 2 positions shown · non-contrast
Comparison: None; correlation CT abdomen and pelvis 11/22/2014

CLINICAL DATA: Prolonged diarrhea

EXAM:
SMALL BOWEL SERIES
TECHNIQUE: Following ingestion of thin barium, serial small bowel images were
obtained including spot views of the terminal ileum.
FLUOROSCOPY TIME:  Radiation Exposure Index (as provided by the
fluoroscopic device): Not provided
If the device does not provide the exposure index:
Fluoroscopy Time (in minutes and seconds):  1 minutes 48 seconds
Number of Acquired Images: 6 plus multiple screen captures during
fluoroscopy

[view not recorded (1 of 2)]
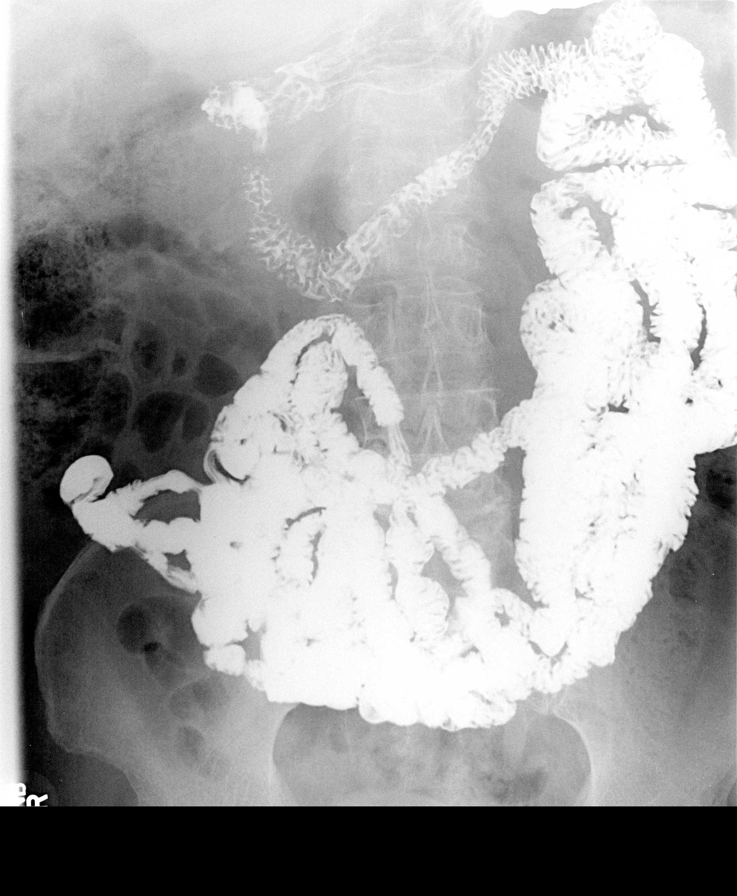

[view not recorded (2 of 2)]
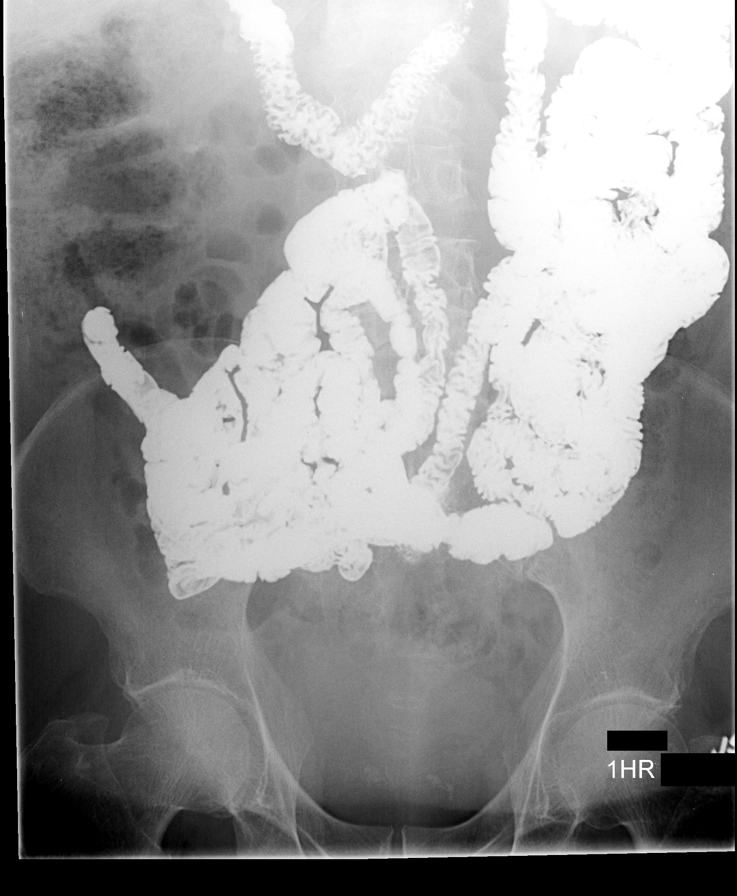

[2 of 2 positions shown; findings below may reference images not displayed]

FINDINGS: Normal bowel gas pattern on scout image was stool throughout colon,
greater in ascending colon.

Multiple small BILATERAL nonobstructing renal calculi.

Few prostatic calcifications.

Bones demineralized with degenerative changes lower lumbar spine.

No gastric outlet obstruction.

Duodenal bulb and sweep normal morphology.

Normal mucosal fold pattern on duodenum, jejunum and ileum.

Gradual passage of contrast through small bowel to colon by 3 hours.

No bowel wall or fold thickening.

No dilatation, stricture or obstruction.

Focal compression views of the terminal ileum are unremarkable.
IMPRESSION: Normal small bowel follow-through.

Multiple BILATERAL small nonobstructing renal calculi.

## 2017-04-21 ENCOUNTER — Encounter (HOSPITAL_COMMUNITY): Payer: Self-pay

## 2017-04-26 ENCOUNTER — Other Ambulatory Visit (HOSPITAL_COMMUNITY): Payer: Self-pay | Admitting: Emergency Medicine

## 2017-04-26 NOTE — Patient Instructions (Signed)
Marcus Simpson  04/26/2017   Your procedure is scheduled on: 05-02-17  Report to [facility] Main  Entrance    Report to admitting at 11:15AM   Call this number if you have problems the morning of surgery [phone]     Remember: Do not eat food After Midnight. YOU MAY HAVE CLEAR LIQUIDS FROM MIDNIGHT UNTIL 7:45AM DAY OF SURGERY. NOTHING BY MOUTH AFTER 7:45AM!     Take these medicines the morning of surgery with A SIP OF WATER: AMLODIPINE,                                 You may not have any metal on your body including hair pins and              piercings  Do not wear jewelry, make-up, lotions, powders or perfumes, deodorant              Men may shave face and neck.   Do not bring valuables to the hospital. [place].  Contacts, dentures or bridgework may not be worn into surgery.  Leave suitcase in the car. After surgery it may be brought to your room.                Please read over the following fact sheets you were given: _____________________________________________________________________     CLEAR LIQUID DIET   Foods Allowed                                                                     Foods Excluded  Coffee and tea, regular and decaf                             liquids that you cannot  Plain Jell-O in any flavor                                             see through such as: Fruit ices (not with fruit pulp)                                     milk, soups, orange juice  Iced Popsicles                                    All solid food Carbonated beverages, regular and diet                                    Cranberry, grape and apple juices Sports drinks like Gatorade Lightly seasoned clear broth or consume(fat free) Sugar, honey syrup  Sample Menu Breakfast  Lunch                                     Supper Cranberry juice                    Beef  broth                            Chicken broth Jell-O                                     Grape juice                           Apple juice Coffee or tea                        Jell-O                                      Popsicle                                                Coffee or tea                        Coffee or tea  _____________________________________________________________________  [facility] - Preparing for Surgery Before surgery, you can play an important role.  Because skin is not sterile, your skin needs to be as free of germs as possible.  You can reduce the number of germs on your skin by washing with CHG (chlorahexidine gluconate) soap before surgery.  CHG is an antiseptic cleaner which kills germs and bonds with the skin to continue killing germs even after washing. Please DO NOT use if you have an allergy to CHG or antibacterial soaps.  If your skin becomes reddened/irritated stop using the CHG and inform your nurse when you arrive at Short Stay. Do not shave (including legs and underarms) for at least 48 hours prior to the first CHG shower.  You may shave your face/neck. Please follow these instructions carefully:  1.  Shower with CHG Soap the night before surgery and the  morning of Surgery.  2.  If you choose to wash your hair, wash your hair first as usual with your  normal  shampoo.  3.  After you shampoo, rinse your hair and body thoroughly to remove the  shampoo.                           4.  Use CHG as you would any other liquid soap.  You can apply chg directly  to the skin and wash                       Gently with a scrungie or clean washcloth.  5.  Apply the CHG Soap to your body ONLY FROM THE NECK DOWN.   Do not use on face/ open  Wound or open sores. Avoid contact with eyes, ears mouth and genitals (private parts).                       Wash face,  Genitals (private parts) with your normal soap.             6.  Wash thoroughly, paying  special attention to the area where your surgery  will be performed.  7.  Thoroughly rinse your body with warm water from the neck down.  8.  DO NOT shower/wash with your normal soap after using and rinsing off  the CHG Soap.                9.  Pat yourself dry with a clean towel.            10.  Wear clean pajamas.            11.  Place clean sheets on your bed the night of your first shower and do not  sleep with pets. Day of Surgery : Do not apply any lotions/deodorants the morning of surgery.  Please wear clean clothes to the hospital/surgery center.  FAILURE TO FOLLOW THESE INSTRUCTIONS MAY RESULT IN THE CANCELLATION OF YOUR SURGERY PATIENT SIGNATURE_________________________________  NURSE SIGNATURE__________________________________  ________________________________________________________________________   Adam Phenix  An incentive spirometer is a tool that can help keep your lungs clear and active. This tool measures how well you are filling your lungs with each breath. Taking long deep breaths may help reverse or decrease the chance of developing breathing (pulmonary) problems (especially infection) following:  A long period of time when you are unable to move or be active. BEFORE THE PROCEDURE   If the spirometer includes an indicator to show your best effort, your nurse or respiratory therapist will set it to a desired goal.  If possible, sit up straight or lean slightly forward. Try not to slouch.  Hold the incentive spirometer in an upright position. INSTRUCTIONS FOR USE  1. Sit on the edge of your bed if possible, or sit up as far as you can in bed or on a chair. 2. Hold the incentive spirometer in an upright position. 3. Breathe out normally. 4. Place the mouthpiece in your mouth and seal your lips tightly around it. 5. Breathe in slowly and as deeply as possible, raising the piston or the ball toward the top of the column. 6. Hold your breath for 3-5 seconds  or for as long as possible. Allow the piston or ball to fall to the bottom of the column. 7. Remove the mouthpiece from your mouth and breathe out normally. 8. Rest for a few seconds and repeat Steps 1 through 7 at least 10 times every 1-2 hours when you are awake. Take your time and take a few normal breaths between deep breaths. 9. The spirometer may include an indicator to show your best effort. Use the indicator as a goal to work toward during each repetition. 10. After each set of 10 deep breaths, practice coughing to be sure your lungs are clear. If you have an incision (the cut made at the time of surgery), support your incision when coughing by placing a pillow or rolled up towels firmly against it. Once you are able to get out of bed, walk around indoors and cough well. You may stop using the incentive spirometer when instructed by your caregiver.  RISKS AND COMPLICATIONS  Take your time so you do not  get dizzy or light-headed.  If you are in pain, you may need to take or ask for pain medication before doing incentive spirometry. It is harder to take a deep breath if you are having pain. AFTER USE  Rest and breathe slowly and easily.  It can be helpful to keep track of a log of your progress. Your caregiver can provide you with a simple table to help with this. If you are using the spirometer at home, follow these instructions: [place] IF:   You are having difficultly using the spirometer.  You have trouble using the spirometer as often as instructed.  Your pain medication is not giving enough relief while using the spirometer.  You develop fever of 100.5 F (38.1 C) or higher. SEEK IMMEDIATE MEDICAL CARE IF:   You cough up bloody sputum that had not been present before.  You develop fever of 102 F (38.9 C) or greater.  You develop worsening pain at or near the incision site. MAKE SURE YOU:   Understand these instructions.  Will watch your condition.  Will  get help right away if you are not doing well or get worse. Document Released: 07/19/2006 Document Revised: 05/31/2011 Document Reviewed: 09/19/2006 ExitCare Patient Information 2014 ExitCare, [place].   ________________________________________________________________________  WHAT IS A BLOOD TRANSFUSION? Blood Transfusion Information  A transfusion is the replacement of blood or some of its parts. Blood is made up of multiple cells which provide different functions.  Red blood cells carry oxygen and are used for blood loss replacement.  White blood cells fight against infection.  Platelets control bleeding.  Plasma helps clot blood.  Other blood products are available for specialized needs, such as hemophilia or other clotting disorders. BEFORE THE TRANSFUSION  Who gives blood for transfusions?   Healthy volunteers who are fully evaluated to make sure their blood is safe. This is blood bank blood. Transfusion therapy is the safest it has ever been in the practice of medicine. Before blood is taken from a donor, a complete history is taken to make sure that person has no history of diseases nor engages in risky social behavior (examples are intravenous drug use or sexual activity with multiple partners). The donor's travel history is screened to minimize risk of transmitting infections, such as malaria. The donated blood is tested for signs of infectious diseases, such as HIV and hepatitis. The blood is then tested to be sure it is compatible with you in order to minimize the chance of a transfusion reaction. If you or a relative donates blood, this is often done in anticipation of surgery and is not appropriate for emergency situations. It takes many days to process the donated blood. RISKS AND COMPLICATIONS Although transfusion therapy is very safe and saves many lives, the main dangers of transfusion include:   Getting an infectious disease.  Developing a transfusion reaction. This is  an allergic reaction to something in the blood you were given. Every precaution is taken to prevent this. The decision to have a blood transfusion has been considered carefully by your caregiver before blood is given. Blood is not given unless the benefits outweigh the risks. AFTER THE TRANSFUSION  Right after receiving a blood transfusion, you will usually feel much better and more energetic. This is especially true if your red blood cells have gotten low (anemic). The transfusion raises the level of the red blood cells which carry oxygen, and this usually causes an energy increase.  The nurse administering the transfusion  will monitor you carefully for complications. HOME CARE INSTRUCTIONS  No special instructions are needed after a transfusion. You may find your energy is better. Speak with your caregiver about any limitations on activity for underlying diseases you may have. SEEK MEDICAL CARE IF:   Your condition is not improving after your transfusion.  You develop redness or irritation at the intravenous (IV) site. SEEK IMMEDIATE MEDICAL CARE IF:  Any of the following symptoms occur over the next 12 hours:  Shaking chills.  You have a temperature by mouth above 102 F (38.9 C), not controlled by medicine.  Chest, back, or muscle pain.  People around you feel you are not acting correctly or are confused.  Shortness of breath or difficulty breathing.  Dizziness and fainting.  You get a rash or develop hives.  You have a decrease in urine output.  Your urine turns a dark color or changes to pink, red, or brown. Any of the following symptoms occur over the next 10 days:  You have a temperature by mouth above 102 F (38.9 C), not controlled by medicine.  Shortness of breath.  Weakness after normal activity.  The white part of the eye turns yellow (jaundice).  You have a decrease in the amount of urine or are urinating less often.  Your urine turns a dark color or  changes to pink, red, or brown. Document Released: 03/05/2000 Document Revised: 05/31/2011 Document Reviewed: 10/23/2007 [facility] Patient Information 2014 [place], [place].  _______________________________________________________________________

## 2017-04-27 ENCOUNTER — Other Ambulatory Visit: Payer: Self-pay

## 2017-04-27 ENCOUNTER — Encounter (HOSPITAL_COMMUNITY): Payer: Self-pay

## 2017-04-27 ENCOUNTER — Encounter (HOSPITAL_COMMUNITY)
Admission: RE | Admit: 2017-04-27 | Discharge: 2017-04-27 | Disposition: A | Discharge status: Home / Self Care | Payer: Medicare Other | Source: Ambulatory Visit | Attending: Orthopedic Surgery | Admitting: Orthopedic Surgery

## 2017-04-27 ENCOUNTER — Encounter (INDEPENDENT_AMBULATORY_CARE_PROVIDER_SITE_OTHER): Payer: Self-pay

## 2017-04-27 DIAGNOSIS — Z01810 Encounter for preprocedural cardiovascular examination: Secondary | ICD-10-CM | POA: Diagnosis not present

## 2017-04-27 DIAGNOSIS — R001 Bradycardia, unspecified: Secondary | ICD-10-CM | POA: Insufficient documentation

## 2017-04-27 DIAGNOSIS — M1712 Unilateral primary osteoarthritis, left knee: Secondary | ICD-10-CM | POA: Insufficient documentation

## 2017-04-27 DIAGNOSIS — Z01812 Encounter for preprocedural laboratory examination: Secondary | ICD-10-CM | POA: Diagnosis not present

## 2017-04-27 DIAGNOSIS — I10 Essential (primary) hypertension: Secondary | ICD-10-CM | POA: Insufficient documentation

## 2017-04-27 HISTORY — DX: Personal history of urinary calculi: Z87.442

## 2017-04-27 HISTORY — DX: Bradycardia, unspecified: R00.1

## 2017-04-27 HISTORY — DX: Unspecified osteoarthritis, unspecified site: M19.90

## 2017-04-27 LAB — COMPREHENSIVE METABOLIC PANEL
ALBUMIN: 4.3 g/dL (ref 3.5–5.0)
ALK PHOS: 87 U/L (ref 38–126)
ALT: 17 U/L (ref 17–63)
ANION GAP: 7 (ref 5–15)
AST: 22 U/L (ref 15–41)
BILIRUBIN TOTAL: 1.2 mg/dL (ref 0.3–1.2)
BUN: 15 mg/dL (ref 6–20)
CALCIUM: 8.8 mg/dL — AB (ref 8.9–10.3)
CO2: 26 mmol/L (ref 22–32)
Chloride: 107 mmol/L (ref 101–111)
Creatinine, Ser: 0.93 mg/dL (ref 0.61–1.24)
GFR calc Af Amer: >60 mL/min (ref >60)
GFR calc non Af Amer: >60 mL/min (ref >60)
GLUCOSE: 113 mg/dL — AB (ref 65–99)
Potassium: 4.1 mmol/L (ref 3.5–5.1)
SODIUM: 140 mmol/L (ref 135–145)
TOTAL PROTEIN: 7.1 g/dL (ref 6.5–8.1)

## 2017-04-27 LAB — SURGICAL PCR SCREEN
MRSA, PCR: NEGATIVE
STAPHYLOCOCCUS AUREUS: NEGATIVE

## 2017-04-27 LAB — CBC
HEMATOCRIT: 39.3 % (ref 39.0–52.0)
HEMOGLOBIN: 13.4 g/dL (ref 13.0–17.0)
MCH: 31.1 pg (ref 26.0–34.0)
MCHC: 34.1 g/dL (ref 30.0–36.0)
MCV: 91.2 fL (ref 78.0–100.0)
Platelets: 215 10*3/uL (ref 150–400)
RBC: 4.31 MIL/uL (ref 4.22–5.81)
RDW: 12.9 % (ref 11.5–15.5)
WBC: 7.2 10*3/uL (ref 4.0–10.5)

## 2017-04-27 LAB — PROTIME-INR
INR: 1.02
Prothrombin Time: 13.3 seconds (ref 11.4–15.2)

## 2017-04-27 LAB — APTT: APTT: 30 s (ref 24–36)

## 2017-04-27 LAB — ABO/RH: ABO/RH(D): O POS

## 2017-04-28 ENCOUNTER — Ambulatory Visit: Payer: Self-pay | Admitting: Orthopedic Surgery

## 2017-05-01 ENCOUNTER — Ambulatory Visit: Payer: Self-pay | Admitting: Orthopedic Surgery

## 2017-05-01 MED ORDER — BUPIVACAINE LIPOSOME 1.3 % IJ SUSP
20.0000 mL | INTRAMUSCULAR | Status: DC
  Filled 2017-05-01: qty 20

## 2017-05-01 NOTE — H&P (View-Only) (Signed)
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    Medications Reviewed Medications amLODIPine 5 mg tablet 01/17/17   filled PRESCRIPTION SOLUTIONS Aspir-81 04/19/17   entered ALEXZANDREW PERKINS, PA-C multivitamin 04/19/17   entered ALEXZANDREW PERKINS, PA-C   Problems Reviewed Problems Osteoarthritis of knee - Onset: 04/27/2017   Family History Reviewed Family History Father - Father deceased   - Cerebrovascular accident Mother - Mother deceased   - Pulmonary emphysema Unspecified Relation - Diabetes mellitus   - Heart disease   - Total knee replacement - Multiple Siblings   Social History Reviewed Social History Smoking Status: Never smoker Chewing tobacco: none Alcohol intake: None Hand Dominance: Right Work related injury?: N Advance directive: Y Medical Power of Attorney: N   Surgical History Reviewed Surgical History Prostate biopsy, any mthd - November 2015   Past Medical History Reviewed Past Medical History Cancer: Y - Prostate Cancer GERD/Reflux: Y Hypertension: Y Joint Pain: Y Swelling of Legs/Feet/Hands: Y Notes: Vertigo,  Shingles,  Kidney Stones    HPI He comes in today for an H&P for his left TKA scheduled on 05/02/2017 at [facility] Notes: The patient is being followed for their left knee pain and osteoarthritis. They are now month(s) out from Euflexxa series. Symptoms reported include: pain, pain after sitting, aching, giving way, pain with weightbearing and difficulty ambulating. The patient feels  that they are doing poorly and report their pain level to be moderate to severe. The patient has not gotten any relief of their symptoms with Cortisone injections or viscosupplementation. Mr. Takahiro states that the last round of Visco supplementation did not help and he is ready to go ahead and get his knee replaced at this time. It bothers him with weightbearing activities and aches with weather change. He is ready to proceed with surgery at this time.    ROS Constitutional: Constitutional: no fever, chills, night sweats, or significant weight loss.  Cardiovascular: Cardiovascular: no palpitations or chest pain.  Respiratory: Respiratory: no cough or shortness of breath and No COPD.  Gastrointestinal: Gastrointestinal: no vomiting or nausea.  Musculoskeletal: Musculoskeletal: no swelling in Joints and Joint Pain.  Neurologic: Neurologic: no numbness, tingling, or difficulty with balance.    Physical Exam Patient is a 72 year old male.  General Mental Status - Alert, cooperative and good historian. General Appearance - pleasant, Not in acute distress. Orientation - Oriented X3. Build & Nutrition - Well nourished and Well developed.  Head and Neck Head - normocephalic, atraumatic . Neck Global Assessment - supple, no bruit auscultated on the right, no bruit auscultated on the left.  Eye Pupil - Bilateral - PERR Motion - Bilateral - EOMI.  Chest and Lung Exam Auscultation Breath sounds - clear at anterior chest wall and clear at posterior chest wall. Adventitious sounds - No Adventitious sounds.  Cardiovascular Auscultation Rhythm - Regular rate and rhythm. Heart Sounds - S1 WNL and S2 WNL. Murmurs & Other Heart Sounds - Auscultation of the heart reveals - No Murmurs.  Abdomen Palpation/Percussion Tenderness - Abdomen is  non-tender to palpation. Abdomen is soft. Auscultation Auscultation of the abdomen reveals - Bowel sounds normal.  Male Genitourinary Note: Not  done, not pertinent to present illness  Musculoskeletal His knee shows a significant varus deformity. Tenderness in the medial joint line. Pain on medial McMurray's. Significant patellofemoral crepitation throughout range of motion. No effusion.  X-rays show bone-on-bone medial compartment osteoarthritic change with cystic degenerative changes in the medial femoral condyle and severe patellofemoral degenerative change.    Assessment / Plan 1. Osteoarthritis of knee M17.12: Unilateral primary osteoarthritis, left knee  Patient Instructions Surgical Plans: Left Total Knee Replacement Disposition: Home with Son and Daughter PCP: Dr. Woody Seller IV TXA Anesthesia Issues: None   Patient was instructed on what medications to stop prior to surgery.  - Follow up visit in 2 weeks with Dr. Wynelle Link - Begin physical therapy following surgery - Pre-operative lab work as pre Pre-Surgical Testing - Prescriptions will be provided in hospital at time of discharge  Return to [place], MD for [place] at [facility] on 05/17/2017 at 03:00 PM  Encounter signed-off by Mickel Crow, PA-C

## 2017-05-01 NOTE — H&P (Signed)
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    Medications Reviewed Medications amLODIPine 5 mg tablet 01/17/17   filled PRESCRIPTION SOLUTIONS Aspir-81 04/19/17   entered Meilah Delrosario, PA-C multivitamin 04/19/17   entered Dallon Dacosta, PA-C   Problems Reviewed Problems Osteoarthritis of knee - Onset: 04/27/2017   Family History Reviewed Family History Father - Father deceased   - Cerebrovascular accident Mother - Mother deceased   - Pulmonary emphysema Unspecified Relation - Diabetes mellitus   - Heart disease   - Total knee replacement - Multiple Siblings   Social History Reviewed Social History Smoking Status: Never smoker Chewing tobacco: none Alcohol intake: None Hand Dominance: Right Work related injury?: N Advance directive: Y Medical Power of Attorney: N   Surgical History Reviewed Surgical History Prostate biopsy, any mthd - November 2015   Past Medical History Reviewed Past Medical History Cancer: Y - Prostate Cancer GERD/Reflux: Y Hypertension: Y Joint Pain: Y Swelling of Legs/Feet/Hands: Y Notes: Vertigo,  Shingles,  Kidney Stones    HPI He comes in today for an H&P for his left TKA scheduled on 05/02/2017 at [facility] Notes: The patient is being followed for their left knee pain and osteoarthritis. They are now month(s) out from Euflexxa series. Symptoms reported include: pain, pain after sitting, aching, giving way, pain with weightbearing and difficulty ambulating. The patient feels  that they are doing poorly and report their pain level to be moderate to severe. The patient has not gotten any relief of their symptoms with Cortisone injections or viscosupplementation. Mr. [name] states that the last round of Visco supplementation did not help and he is ready to go ahead and get his knee replaced at this time. It bothers him with weightbearing activities and aches with weather change. He is ready to proceed with surgery at this time.    ROS Constitutional: Constitutional: no fever, chills, night sweats, or significant weight loss.  Cardiovascular: Cardiovascular: no palpitations or chest pain.  Respiratory: Respiratory: no cough or shortness of breath and No COPD.  Gastrointestinal: Gastrointestinal: no vomiting or nausea.  Musculoskeletal: Musculoskeletal: no swelling in Joints and Joint Pain.  Neurologic: Neurologic: no numbness, tingling, or difficulty with balance.    Physical Exam Patient is a 72 year old male.  General Mental Status - Alert, cooperative and good historian. General Appearance - pleasant, Not in acute distress. Orientation - Oriented X3. Build & Nutrition - Well nourished and Well developed.  Head and Neck Head - normocephalic, atraumatic . Neck Global Assessment - supple, no bruit auscultated on the right, no bruit auscultated on the left.  Eye Pupil - Bilateral - PERR Motion - Bilateral - EOMI.  Chest and Lung Exam Auscultation Breath sounds - clear at anterior chest wall and clear at posterior chest wall. Adventitious sounds - No Adventitious sounds.  Cardiovascular Auscultation Rhythm - Regular rate and rhythm. Heart Sounds - S1 WNL and S2 WNL. Murmurs & Other Heart Sounds - Auscultation of the heart reveals - No Murmurs.  Abdomen Palpation/Percussion Tenderness - Abdomen is  non-tender to palpation. Abdomen is soft. Auscultation Auscultation of the abdomen reveals - Bowel sounds normal.  Male Genitourinary Note: Not  done, not pertinent to present illness  Musculoskeletal His knee shows a significant varus deformity. Tenderness in the medial joint line. Pain on medial McMurray's. Significant patellofemoral crepitation throughout range of motion. No effusion.  X-rays show bone-on-bone medial compartment osteoarthritic change with cystic degenerative changes in the medial femoral condyle and severe patellofemoral degenerative change.    Assessment / Plan 1. Osteoarthritis of knee M17.12: Unilateral primary osteoarthritis, left knee  Patient Instructions Surgical Plans: Left Total Knee Replacement Disposition: Home with Son and Daughter PCP: Dr. Woody Seller IV TXA Anesthesia Issues: None   Patient was instructed on what medications to stop prior to surgery.  - Follow up visit in 2 weeks with Dr. Wynelle Link - Begin physical therapy following surgery - Pre-operative lab work as pre Pre-Surgical Testing - Prescriptions will be provided in hospital at time of discharge  Return to [place], MD for [place] at [facility] on 05/17/2017 at 03:00 PM  Encounter signed-off by Mickel Crow, PA-C

## 2017-05-02 ENCOUNTER — Inpatient Hospital Stay (HOSPITAL_COMMUNITY): Payer: Medicare Other | Admitting: Anesthesiology

## 2017-05-02 ENCOUNTER — Encounter (HOSPITAL_COMMUNITY): Payer: Self-pay | Admitting: *Deleted

## 2017-05-02 ENCOUNTER — Other Ambulatory Visit: Payer: Self-pay

## 2017-05-02 ENCOUNTER — Inpatient Hospital Stay (HOSPITAL_COMMUNITY)
Admission: RE | Admit: 2017-05-02 | Discharge: 2017-05-04 | DRG: 470 | Disposition: A | Discharge status: Home / Self Care | Payer: Medicare Other | Source: Ambulatory Visit | Attending: Orthopedic Surgery | Admitting: Orthopedic Surgery

## 2017-05-02 ENCOUNTER — Encounter (HOSPITAL_COMMUNITY)
Admission: RE | Disposition: A | Discharge status: Home / Self Care | Payer: Self-pay | Source: Ambulatory Visit | Attending: Orthopedic Surgery

## 2017-05-02 DIAGNOSIS — M1710 Unilateral primary osteoarthritis, unspecified knee: Secondary | ICD-10-CM

## 2017-05-02 DIAGNOSIS — Z8546 Personal history of malignant neoplasm of prostate: Secondary | ICD-10-CM | POA: Diagnosis not present

## 2017-05-02 DIAGNOSIS — M1712 Unilateral primary osteoarthritis, left knee: Principal | ICD-10-CM | POA: Diagnosis present

## 2017-05-02 DIAGNOSIS — M25762 Osteophyte, left knee: Secondary | ICD-10-CM | POA: Diagnosis present

## 2017-05-02 DIAGNOSIS — Z79899 Other long term (current) drug therapy: Secondary | ICD-10-CM

## 2017-05-02 DIAGNOSIS — Z885 Allergy status to narcotic agent status: Secondary | ICD-10-CM | POA: Diagnosis not present

## 2017-05-02 DIAGNOSIS — K219 Gastro-esophageal reflux disease without esophagitis: Secondary | ICD-10-CM | POA: Diagnosis present

## 2017-05-02 DIAGNOSIS — I10 Essential (primary) hypertension: Secondary | ICD-10-CM | POA: Diagnosis present

## 2017-05-02 DIAGNOSIS — Z87891 Personal history of nicotine dependence: Secondary | ICD-10-CM

## 2017-05-02 DIAGNOSIS — M179 Osteoarthritis of knee, unspecified: Secondary | ICD-10-CM | POA: Diagnosis present

## 2017-05-02 HISTORY — PX: TOTAL KNEE ARTHROPLASTY: SHX125

## 2017-05-02 LAB — TYPE AND SCREEN
ABO/RH(D): O POS
ANTIBODY SCREEN: NEGATIVE

## 2017-05-02 SURGERY — ARTHROPLASTY, KNEE, TOTAL
Anesthesia: Spinal | Site: Knee | Laterality: Left

## 2017-05-02 MED ORDER — FENTANYL CITRATE (PF) 100 MCG/2ML IJ SOLN
INTRAMUSCULAR | Status: AC
  Administered 2017-05-02: 50 ug via INTRAVENOUS
  Filled 2017-05-02: qty 2

## 2017-05-02 MED ORDER — ACETAMINOPHEN 500 MG PO TABS
1000.0000 mg | ORAL_TABLET | Freq: Four times a day (QID) | ORAL | Status: AC
  Administered 2017-05-02 – 2017-05-03 (×3): 1000 mg via ORAL
  Filled 2017-05-02 (×3): qty 2

## 2017-05-02 MED ORDER — PROPOFOL 10 MG/ML IV BOLUS
INTRAVENOUS | Status: AC
  Filled 2017-05-02: qty 40

## 2017-05-02 MED ORDER — ONDANSETRON HCL 4 MG/2ML IJ SOLN
INTRAMUSCULAR | Status: DC | PRN
  Administered 2017-05-02: 4 mg via INTRAVENOUS

## 2017-05-02 MED ORDER — FENTANYL CITRATE (PF) 100 MCG/2ML IJ SOLN
100.0000 ug | Freq: Once | INTRAMUSCULAR | Status: AC
  Administered 2017-05-02: 50 ug via INTRAVENOUS

## 2017-05-02 MED ORDER — ACETAMINOPHEN 10 MG/ML IV SOLN
1000.0000 mg | Freq: Once | INTRAVENOUS | Status: AC
  Administered 2017-05-02: 1000 mg via INTRAVENOUS
  Filled 2017-05-02: qty 100

## 2017-05-02 MED ORDER — BUPIVACAINE IN DEXTROSE 0.75-8.25 % IT SOLN
INTRATHECAL | Status: DC | PRN
Start: 2017-05-02 — End: 2017-05-02
  Administered 2017-05-02: 1.8 mL via INTRATHECAL

## 2017-05-02 MED ORDER — POLYETHYLENE GLYCOL 3350 17 G PO PACK: 17.0000 g | PACK | Freq: Every day | ORAL | Status: DC | PRN

## 2017-05-02 MED ORDER — METHOCARBAMOL 500 MG PO TABS
500.0000 mg | ORAL_TABLET | Freq: Four times a day (QID) | ORAL | Status: DC | PRN
  Administered 2017-05-03 – 2017-05-04 (×2): 500 mg via ORAL
  Filled 2017-05-02 (×2): qty 1

## 2017-05-02 MED ORDER — OXYCODONE HCL 5 MG PO TABS: 5.0000 mg | ORAL_TABLET | Freq: Once | ORAL | Status: DC | PRN

## 2017-05-02 MED ORDER — FENTANYL CITRATE (PF) 100 MCG/2ML IJ SOLN: 25.0000 ug | INTRAMUSCULAR | Status: DC | PRN

## 2017-05-02 MED ORDER — FLEET ENEMA 7-19 GM/118ML RE ENEM: 1.0000 | ENEMA | Freq: Once | RECTAL | Status: DC | PRN

## 2017-05-02 MED ORDER — AMLODIPINE BESYLATE 5 MG PO TABS
5.0000 mg | ORAL_TABLET | Freq: Every day | ORAL | Status: DC
  Administered 2017-05-02 – 2017-05-04 (×3): 5 mg via ORAL
  Filled 2017-05-02 (×3): qty 1

## 2017-05-02 MED ORDER — SODIUM CHLORIDE 0.9 % IR SOLN
Status: DC | PRN
  Administered 2017-05-02: 1000 mL

## 2017-05-02 MED ORDER — SODIUM CHLORIDE 0.9 % IJ SOLN
INTRAMUSCULAR | Status: AC
  Filled 2017-05-02: qty 50

## 2017-05-02 MED ORDER — SODIUM CHLORIDE 0.9 % IJ SOLN
INTRAMUSCULAR | Status: AC
  Filled 2017-05-02: qty 10

## 2017-05-02 MED ORDER — OXYCODONE HCL 5 MG/5ML PO SOLN
5.0000 mg | Freq: Once | ORAL | Status: DC | PRN
  Filled 2017-05-02: qty 5

## 2017-05-02 MED ORDER — RIVAROXABAN 10 MG PO TABS
10.0000 mg | ORAL_TABLET | Freq: Every day | ORAL | Status: DC
  Administered 2017-05-03 – 2017-05-04 (×2): 10 mg via ORAL
  Filled 2017-05-02 (×2): qty 1

## 2017-05-02 MED ORDER — MORPHINE SULFATE (PF) 2 MG/ML IV SOLN: 1.0000 mg | INTRAVENOUS | Status: DC | PRN

## 2017-05-02 MED ORDER — MIDAZOLAM HCL 2 MG/2ML IJ SOLN
2.0000 mg | Freq: Once | INTRAMUSCULAR | Status: AC
  Administered 2017-05-02: 1 mg via INTRAVENOUS

## 2017-05-02 MED ORDER — STERILE WATER FOR IRRIGATION IR SOLN
Status: DC | PRN
  Administered 2017-05-02: 2000 mL

## 2017-05-02 MED ORDER — DEXAMETHASONE SODIUM PHOSPHATE 10 MG/ML IJ SOLN
10.0000 mg | Freq: Once | INTRAMUSCULAR | Status: AC
  Administered 2017-05-03: 10 mg via INTRAVENOUS
  Filled 2017-05-02: qty 1

## 2017-05-02 MED ORDER — PROPOFOL 500 MG/50ML IV EMUL
INTRAVENOUS | Status: DC | PRN
  Administered 2017-05-02 (×2): 20 mg via INTRAVENOUS

## 2017-05-02 MED ORDER — OXYCODONE HCL 5 MG PO TABS: 10.0000 mg | ORAL_TABLET | ORAL | Status: DC | PRN

## 2017-05-02 MED ORDER — BISACODYL 10 MG RE SUPP: 10.0000 mg | Freq: Every day | RECTAL | Status: DC | PRN

## 2017-05-02 MED ORDER — CHLORHEXIDINE GLUCONATE 4 % EX LIQD: 60.0000 mL | Freq: Once | CUTANEOUS | Status: DC

## 2017-05-02 MED ORDER — ONDANSETRON HCL 4 MG/2ML IJ SOLN: 4.0000 mg | Freq: Once | INTRAMUSCULAR | Status: DC | PRN

## 2017-05-02 MED ORDER — OXYCODONE HCL 5 MG PO TABS
5.0000 mg | ORAL_TABLET | ORAL | Status: DC | PRN
  Administered 2017-05-03 (×3): 5 mg via ORAL
  Filled 2017-05-02 (×3): qty 1

## 2017-05-02 MED ORDER — BUPIVACAINE LIPOSOME 1.3 % IJ SUSP
INTRAMUSCULAR | Status: DC | PRN
  Administered 2017-05-02: 20 mL

## 2017-05-02 MED ORDER — ONDANSETRON HCL 4 MG/2ML IJ SOLN
INTRAMUSCULAR | Status: AC
  Filled 2017-05-02: qty 2

## 2017-05-02 MED ORDER — SODIUM CHLORIDE 0.9 % IV SOLN
INTRAVENOUS | Status: DC
  Administered 2017-05-02: 18:00:00 via INTRAVENOUS

## 2017-05-02 MED ORDER — METHOCARBAMOL 1000 MG/10ML IJ SOLN
500.0000 mg | Freq: Four times a day (QID) | INTRAVENOUS | Status: DC | PRN
  Administered 2017-05-02: 500 mg via INTRAVENOUS
  Filled 2017-05-02: qty 550

## 2017-05-02 MED ORDER — PROPOFOL 10 MG/ML IV BOLUS
INTRAVENOUS | Status: AC
  Filled 2017-05-02: qty 20

## 2017-05-02 MED ORDER — TRANEXAMIC ACID 1000 MG/10ML IV SOLN
1000.0000 mg | INTRAVENOUS | Status: AC
  Administered 2017-05-02: 1000 mg via INTRAVENOUS
  Filled 2017-05-02: qty 1100

## 2017-05-02 MED ORDER — ACETAMINOPHEN 325 MG PO TABS: 650.0000 mg | ORAL_TABLET | ORAL | Status: DC | PRN

## 2017-05-02 MED ORDER — CEFAZOLIN SODIUM-DEXTROSE 2-4 GM/100ML-% IV SOLN
2.0000 g | Freq: Four times a day (QID) | INTRAVENOUS | Status: AC
  Administered 2017-05-02 – 2017-05-03 (×2): 2 g via INTRAVENOUS
  Filled 2017-05-02 (×2): qty 100

## 2017-05-02 MED ORDER — DEXAMETHASONE SODIUM PHOSPHATE 10 MG/ML IJ SOLN
10.0000 mg | Freq: Once | INTRAMUSCULAR | Status: AC
  Administered 2017-05-02: 10 mg via INTRAVENOUS

## 2017-05-02 MED ORDER — SODIUM CHLORIDE 0.9 % IJ SOLN
INTRAMUSCULAR | Status: DC | PRN
  Administered 2017-05-02: 60 mL

## 2017-05-02 MED ORDER — METOCLOPRAMIDE HCL 5 MG/ML IJ SOLN: 5.0000 mg | Freq: Three times a day (TID) | INTRAMUSCULAR | Status: DC | PRN

## 2017-05-02 MED ORDER — MENTHOL 3 MG MT LOZG: 1.0000 | LOZENGE | OROMUCOSAL | Status: DC | PRN

## 2017-05-02 MED ORDER — ACETAMINOPHEN 650 MG RE SUPP: 650.0000 mg | RECTAL | Status: DC | PRN

## 2017-05-02 MED ORDER — PROPOFOL 500 MG/50ML IV EMUL
INTRAVENOUS | Status: DC | PRN
  Administered 2017-05-02: 50 ug/kg/min via INTRAVENOUS

## 2017-05-02 MED ORDER — BUPIVACAINE-EPINEPHRINE (PF) 0.5% -1:200000 IJ SOLN
INTRAMUSCULAR | Status: DC | PRN
  Administered 2017-05-02: 30 mL via PERINEURAL

## 2017-05-02 MED ORDER — TRANEXAMIC ACID 1000 MG/10ML IV SOLN
1000.0000 mg | Freq: Once | INTRAVENOUS | Status: AC
  Administered 2017-05-02: 1000 mg via INTRAVENOUS
  Filled 2017-05-02: qty 1100

## 2017-05-02 MED ORDER — CEFAZOLIN SODIUM-DEXTROSE 2-4 GM/100ML-% IV SOLN
2.0000 g | INTRAVENOUS | Status: AC
  Administered 2017-05-02: 2 g via INTRAVENOUS
  Filled 2017-05-02: qty 100

## 2017-05-02 MED ORDER — DEXAMETHASONE SODIUM PHOSPHATE 10 MG/ML IJ SOLN
INTRAMUSCULAR | Status: AC
  Filled 2017-05-02: qty 1

## 2017-05-02 MED ORDER — ONDANSETRON HCL 4 MG PO TABS: 4.0000 mg | ORAL_TABLET | Freq: Four times a day (QID) | ORAL | Status: DC | PRN

## 2017-05-02 MED ORDER — GABAPENTIN 300 MG PO CAPS
300.0000 mg | ORAL_CAPSULE | Freq: Once | ORAL | Status: AC
  Administered 2017-05-02: 300 mg via ORAL
  Filled 2017-05-02: qty 1

## 2017-05-02 MED ORDER — METOCLOPRAMIDE HCL 5 MG PO TABS: 5.0000 mg | ORAL_TABLET | Freq: Three times a day (TID) | ORAL | Status: DC | PRN

## 2017-05-02 MED ORDER — SODIUM CHLORIDE 0.9 % IR SOLN
Status: DC | PRN
Start: 2017-05-02 — End: 2017-05-02
  Administered 2017-05-02: 1000 mL

## 2017-05-02 MED ORDER — MIDAZOLAM HCL 2 MG/2ML IJ SOLN
INTRAMUSCULAR | Status: AC
Start: 2017-05-02 — End: 2017-05-02
  Administered 2017-05-02: 1 mg via INTRAVENOUS
  Filled 2017-05-02: qty 2

## 2017-05-02 MED ORDER — PHENOL 1.4 % MT LIQD
1.0000 | OROMUCOSAL | Status: DC | PRN
  Filled 2017-05-02: qty 177

## 2017-05-02 MED ORDER — DOCUSATE SODIUM 100 MG PO CAPS
100.0000 mg | ORAL_CAPSULE | Freq: Two times a day (BID) | ORAL | Status: DC
  Administered 2017-05-02 – 2017-05-04 (×4): 100 mg via ORAL
  Filled 2017-05-02 (×4): qty 1

## 2017-05-02 MED ORDER — ONDANSETRON HCL 4 MG/2ML IJ SOLN: 4.0000 mg | Freq: Four times a day (QID) | INTRAMUSCULAR | Status: DC | PRN

## 2017-05-02 MED ORDER — LACTATED RINGERS IV SOLN
INTRAVENOUS | Status: DC
  Administered 2017-05-02 (×2): via INTRAVENOUS

## 2017-05-02 MED ORDER — DIPHENHYDRAMINE HCL 12.5 MG/5ML PO ELIX: 12.5000 mg | ORAL_SOLUTION | ORAL | Status: DC | PRN

## 2017-05-02 SURGICAL SUPPLY — 50 items
BAG DECANTER FOR FLEXI CONT (MISCELLANEOUS) ×3 IMPLANT
BAG SPEC THK2 15X12 ZIP CLS (MISCELLANEOUS) ×1
BAG ZIPLOCK 12X15 (MISCELLANEOUS) ×3 IMPLANT
BANDAGE ACE 6X5 VEL STRL LF (GAUZE/BANDAGES/DRESSINGS) ×3 IMPLANT
BLADE SAG 18X100X1.27 (BLADE) ×3 IMPLANT
BLADE SAW SGTL 11.0X1.19X90.0M (BLADE) ×3 IMPLANT
BOWL SMART MIX CTS (DISPOSABLE) ×3 IMPLANT
CAPT KNEE TOTAL 3 ATTUNE ×2 IMPLANT
CEMENT HV SMART SET (Cement) ×6 IMPLANT
CLOSURE WOUND 1/2 X4 (GAUZE/BANDAGES/DRESSINGS) ×1
COVER SURGICAL LIGHT HANDLE (MISCELLANEOUS) ×3 IMPLANT
CUFF TOURN SGL QUICK 34 (TOURNIQUET CUFF) ×3
CUFF TRNQT CYL 34X4X40X1 (TOURNIQUET CUFF) ×1 IMPLANT
DECANTER SPIKE VIAL GLASS SM (MISCELLANEOUS) ×3 IMPLANT
DRAPE U-SHAPE 47X51 STRL (DRAPES) ×3 IMPLANT
DRSG ADAPTIC 3X8 NADH LF (GAUZE/BANDAGES/DRESSINGS) ×3 IMPLANT
DRSG PAD ABDOMINAL 8X10 ST (GAUZE/BANDAGES/DRESSINGS) ×3 IMPLANT
DURAPREP 26ML APPLICATOR (WOUND CARE) ×3 IMPLANT
ELECT REM PT RETURN 15FT ADLT (MISCELLANEOUS) ×3 IMPLANT
EVACUATOR 1/8 PVC DRAIN (DRAIN) ×3 IMPLANT
GAUZE SPONGE 4X4 12PLY STRL (GAUZE/BANDAGES/DRESSINGS) ×3 IMPLANT
GLOVE BIO SURGEON STRL SZ7.5 (GLOVE) ×2 IMPLANT
GLOVE BIO SURGEON STRL SZ8 (GLOVE) ×3 IMPLANT
GLOVE BIOGEL PI IND STRL 6.5 (GLOVE) IMPLANT
GLOVE BIOGEL PI IND STRL 8 (GLOVE) ×1 IMPLANT
GLOVE BIOGEL PI INDICATOR 6.5 (GLOVE) ×12
GLOVE BIOGEL PI INDICATOR 8 (GLOVE) ×2
GLOVE SURG SS PI 6.5 STRL IVOR (GLOVE) IMPLANT
GOWN STRL REUS W/TWL LRG LVL3 (GOWN DISPOSABLE) ×7 IMPLANT
GOWN STRL REUS W/TWL XL LVL3 (GOWN DISPOSABLE) ×4 IMPLANT
HANDPIECE INTERPULSE COAX TIP (DISPOSABLE) ×3
IMMOBILIZER KNEE 20 (SOFTGOODS) ×3
IMMOBILIZER KNEE 20 THIGH 36 (SOFTGOODS) ×1 IMPLANT
MANIFOLD NEPTUNE II (INSTRUMENTS) ×3 IMPLANT
NS IRRIG 1000ML POUR BTL (IV SOLUTION) ×3 IMPLANT
PACK TOTAL KNEE CUSTOM (KITS) ×3 IMPLANT
PADDING CAST COTTON 6X4 STRL (CAST SUPPLIES) ×5 IMPLANT
POSITIONER SURGICAL ARM (MISCELLANEOUS) ×3 IMPLANT
SET HNDPC FAN SPRY TIP SCT (DISPOSABLE) ×1 IMPLANT
STRIP CLOSURE SKIN 1/2X4 (GAUZE/BANDAGES/DRESSINGS) ×3 IMPLANT
SUT MNCRL AB 4-0 PS2 18 (SUTURE) ×3 IMPLANT
SUT STRATAFIX 0 PDS 27 VIOLET (SUTURE) ×3
SUT VIC AB 2-0 CT1 27 (SUTURE) ×9
SUT VIC AB 2-0 CT1 TAPERPNT 27 (SUTURE) ×3 IMPLANT
SUTURE STRATFX 0 PDS 27 VIOLET (SUTURE) ×1 IMPLANT
SYR 30ML LL (SYRINGE) ×6 IMPLANT
TRAY FOLEY W/METER SILVER 16FR (SET/KITS/TRAYS/PACK) ×3 IMPLANT
WATER STERILE IRR 1000ML POUR (IV SOLUTION) ×6 IMPLANT
WRAP KNEE MAXI GEL POST OP (GAUZE/BANDAGES/DRESSINGS) ×3 IMPLANT
YANKAUER SUCT BULB TIP 10FT TU (MISCELLANEOUS) ×3 IMPLANT

## 2017-05-02 NOTE — Anesthesia Postprocedure Evaluation (Signed)
Anesthesia Post Note  Patient: Marcus Simpson  Procedure(s) Performed: LEFT TOTAL KNEE ARTHROPLASTY (Left Knee)     Patient location during evaluation: PACU Anesthesia Type: Spinal Level of consciousness: awake and alert Pain management: pain level controlled Vital Signs Assessment: post-procedure vital signs reviewed and stable Respiratory status: spontaneous breathing and respiratory function stable Cardiovascular status: blood pressure returned to baseline and stable Postop Assessment: spinal receding and no apparent nausea or vomiting Anesthetic complications: no    Last Vitals:  Vitals:   05/02/17 1545 05/02/17 1600  BP: 136/80 (!) 148/68  Pulse: (!) 50 (!) 49  Resp: 15 15  Temp: (!) 36.3 C   SpO2: 99% 99%    Last Pain:  Vitals:   05/02/17 1600  TempSrc:   PainSc: 0-No pain    LLE Motor Response: Purposeful movement (05/02/17 1600) LLE Sensation: Decreased;Numbness (05/02/17 1600) RLE Motor Response: Purposeful movement(bbends knee off bed) (05/02/17 1600) RLE Sensation: Decreased;Numbness (05/02/17 1600) L Sensory Level: S1-Sole of foot, small toes (05/02/17 1600) R Sensory Level: S1-Sole of foot, small toes (05/02/17 1600)  Audry Pili

## 2017-05-02 NOTE — Anesthesia Procedure Notes (Signed)
Spinal  Patient location during procedure: OR Start time: 05/02/2017 1:29 PM End time: 05/02/2017 1:35 PM Staffing Anesthesiologist: Audry Pili, MD Performed: anesthesiologist  Preanesthetic Checklist Completed: patient identified, surgical consent, pre-op evaluation, timeout performed, IV checked, risks and benefits discussed and monitors and equipment checked Spinal Block Patient position: sitting Prep: DuraPrep Patient monitoring: heart rate, cardiac monitor, continuous pulse ox and blood pressure Approach: midline Location: L2-3 Injection technique: single-shot Needle Needle type: Pencan  Needle gauge: 24 G Additional Notes Functioning IV was confirmed and monitors were applied. Sterile prep and drape, including hand hygiene, mask, and sterile gloves were used. The patient was positioned and the spine was prepped. The skin was anesthetized with lidocaine. First attempt by CRNA with 22ga Quincke unsuccessful. 2nd attempt by Dr. Fransisco Beau with 24ga pencan, success. Free flow of clear CSF was obtained prior to injecting local anesthetic into the CSF. The spinal needle aspirated freely following injection. The needle was carefully withdrawn. The patient tolerated the procedure well. Consent was obtained prior to the procedure with all questions answered and concerns addressed. Risks including, but not limited to, bleeding, infection, nerve damage, paralysis, failed block, inadequate analgesia, allergic reaction, high spinal, itching, and headache were discussed and the patient wished to proceed.  Renold Don, MD

## 2017-05-02 NOTE — Anesthesia Procedure Notes (Signed)
Date/Time: 05/02/2017 1:21 PM Performed by: Glory Buff, CRNA Oxygen Delivery Method: Nasal cannula

## 2017-05-02 NOTE — Plan of Care (Signed)
Plan of care discussed.   

## 2017-05-02 NOTE — Anesthesia Preprocedure Evaluation (Addendum)
Anesthesia Evaluation  Patient identified by MRN, date of birth, ID band Patient awake    Reviewed: Allergy & Precautions, NPO status , Patient's Chart, lab work & pertinent test results  Airway Mallampati: III  TM Distance: >3 FB Neck ROM: Full    Dental  (+) Edentulous Upper, Edentulous Lower   Pulmonary former smoker,    Pulmonary exam normal breath sounds clear to auscultation       Cardiovascular hypertension, Pt. on medications Normal cardiovascular exam Rhythm:Regular Rate:Normal     Neuro/Psych negative neurological ROS  negative psych ROS   GI/Hepatic negative GI ROS, Neg liver ROS,   Endo/Other  Obesity  Renal/GU negative Renal ROS   Prostate cancer    Musculoskeletal  (+) Arthritis ,   Abdominal   Peds  Hematology negative hematology ROS (+)   Anesthesia Other Findings   Reproductive/Obstetrics                            Anesthesia Physical Anesthesia Plan  ASA: II  Anesthesia Plan: Spinal   Post-op Pain Management:  Regional for Post-op pain   Induction:   PONV Risk Score and Plan: 1 and Treatment may vary due to age or medical condition and Propofol infusion  Airway Management Planned: Natural Airway and Nasal Cannula  Additional Equipment: None  Intra-op Plan:   Post-operative Plan:   Informed Consent: I have reviewed the patients History and Physical, chart, labs and discussed the procedure including the risks, benefits and alternatives for the proposed anesthesia with the patient or authorized representative who has indicated his/her understanding and acceptance.   Dental advisory given  Plan Discussed with: CRNA  Anesthesia Plan Comments:         Anesthesia Quick Evaluation

## 2017-05-02 NOTE — Anesthesia Procedure Notes (Signed)
Anesthesia Regional Block: Adductor canal block   Pre-Anesthetic Checklist: ,, timeout performed, Correct Patient, Correct Site, Correct Laterality, Correct Procedure, Correct Position, site marked, Risks and benefits discussed,  Surgical consent,  Pre-op evaluation,  At surgeon's request and post-op pain management  Laterality: Left  Prep: chloraprep       Needles:  Injection technique: Single-shot  Needle Type: Echogenic Needle     Needle Length: 9cm  Needle Gauge: 21     Additional Needles:   Narrative:  Start time: 05/02/2017 12:51 PM End time: 05/02/2017 12:54 PM Injection made incrementally with aspirations every 5 mL.  Performed by: Personally  Anesthesiologist: Audry Pili, MD  Additional Notes: No pain on injection. No increased resistance to injection. Injection made in 5cc increments. Good needle visualization. Patient tolerated the procedure well.

## 2017-05-02 NOTE — Interval H&P Note (Signed)
History and Physical Interval Note:  05/02/2017 12:39 PM  Marcus Simpson  has presented today for surgery, with the diagnosis of Osteoarthritis Left Knee  The various methods of treatment have been discussed with the patient and family. After consideration of risks, benefits and other options for treatment, the patient has consented to  Procedure(s): LEFT TOTAL KNEE ARTHROPLASTY (Left) as a surgical intervention .  The patient's history has been reviewed, patient examined, no change in status, stable for surgery.  I have reviewed the patient's chart and labs.  Questions were answered to the patient's satisfaction.     Pilar Plate Randale Carvalho

## 2017-05-02 NOTE — Op Note (Signed)
OPERATIVE REPORT-TOTAL KNEE ARTHROPLASTY   Pre-operative diagnosis- Osteoarthritis  Left knee(s)  Post-operative diagnosis- Osteoarthritis Left knee(s)  Procedure-  Left  Total Knee Arthroplasty  Surgeon- Dione Plover. Emmajo Bennette, MD  Assistant- Arlee Muslim, PA-C   Anesthesia-  Adductor canal block and spinal  EBL-25 mL   Drains Hemovac  Tourniquet time-  Total Tourniquet Time Documented: Thigh (Left) - 39 minutes Total: Thigh (Left) - 39 minutes     Complications- None  Condition-PACU - hemodynamically stable.   Brief Clinical Note   Marcus Simpson is a 72 y.o. year old male with end stage OA of his left knee with progressively worsening pain and dysfunction. He has constant pain, with activity and at rest and significant functional deficits with difficulties even with ADLs. He has had extensive non-op management including analgesics, injections of cortisone and viscosupplements, and home exercise program, but remains in significant pain with significant dysfunction. Radiographs show bone on bone arthritis medial and patellofemoral. He presents now for left Total Knee Arthroplasty.     Procedure in detail---   The patient is brought into the operating room and positioned supine on the operating table. After successful administration of  Adductor canal block and spinal,   a tourniquet is placed high on the  Left thigh(s) and the lower extremity is prepped and draped in the usual sterile fashion. Time out is performed by the operating team and then the  Left lower extremity is wrapped in Esmarch, knee flexed and the tourniquet inflated to 300 mmHg.       A midline incision is made with a ten blade through the subcutaneous tissue to the level of the extensor mechanism. A fresh blade is used to make a medial parapatellar arthrotomy. Soft tissue over the proximal medial tibia is subperiosteally elevated to the joint line with a knife and into the semimembranosus bursa with a Cobb  elevator. Soft tissue over the proximal lateral tibia is elevated with attention being paid to avoiding the patellar tendon on the tibial tubercle. The patella is everted, knee flexed 90 degrees and the ACL and PCL are removed. Findings are bone on bone medial and patellofemoral with large global osteophytes.        The drill is used to create a starting hole in the distal femur and the canal is thoroughly irrigated with sterile saline to remove the fatty contents. The 5 degree Left  valgus alignment guide is placed into the femoral canal and the distal femoral cutting block is pinned to remove 9 mm off the distal femur. Resection is made with an oscillating saw.      The tibia is subluxed forward and the menisci are removed. The extramedullary alignment guide is placed referencing proximally at the medial aspect of the tibial tubercle and distally along the second metatarsal axis and tibial crest. The block is pinned to remove 69mm off the more deficient medial  side. Resection is made with an oscillating saw. Size 7 is the most appropriate size for the tibia and the proximal tibia is prepared with the modular drill and keel punch for that size.      The femoral sizing guide is placed and size 7 is most appropriate. Rotation is marked off the epicondylar axis and confirmed by creating a rectangular flexion gap at 90 degrees. The size 7 cutting block is pinned in this rotation and the anterior, posterior and chamfer cuts are made with the oscillating saw. The intercondylar block is then placed and that cut  is made.      Trial size 7 tibial component, trial size 7 posterior stabilized femur and a 6  mm posterior stabilized rotating platform insert trial is placed. Full extension is achieved with excellent varus/valgus and anterior/posterior balance throughout full range of motion. The patella is everted and thickness measured to be 25  mm. Free hand resection is taken to 15 mm, a 38 template is placed, lug holes  are drilled, trial patella is placed, and it tracks normally. Osteophytes are removed off the posterior femur with the trial in place. All trials are removed and the cut bone surfaces prepared with pulsatile lavage. Cement is mixed and once ready for implantation, the size 7 tibial implant, size  7 posterior stabilized femoral component, and the size 38 patella are cemented in place and the patella is held with the clamp. The trial insert is placed and the knee held in full extension. The Exparel (20 ml mixed with 60 ml saline) is injected into the extensor mechanism, posterior capsule, medial and lateral gutters and subcutaneous tissues.  All extruded cement is removed and once the cement is hard the permanent 6 mm posterior stabilized rotating platform insert is placed into the tibial tray.      The wound is copiously irrigated with saline solution and the extensor mechanism closed over a hemovac drain with #1 V-loc suture. The tourniquet is released for a total tourniquet time of 39  minutes. Flexion against gravity is 140 degrees and the patella tracks normally. Subcutaneous tissue is closed with 2.0 vicryl and subcuticular with running 4.0 Monocryl. The incision is cleaned and dried and steri-strips and a bulky sterile dressing are applied. The limb is placed into a knee immobilizer and the patient is awakened and transported to recovery in stable condition.      Please note that a surgical assistant was a medical necessity for this procedure in order to perform it in a safe and expeditious manner. Surgical assistant was necessary to retract the ligaments and vital neurovascular structures to prevent injury to them and also necessary for proper positioning of the limb to allow for anatomic placement of the prosthesis.   Dione Plover Marcus Minniefield, MD    05/02/2017, 2:37 PM

## 2017-05-02 NOTE — Transfer of Care (Signed)
Immediate Anesthesia Transfer of Care Note  Patient: Travoris Bushey Remache  Procedure(s) Performed: LEFT TOTAL KNEE ARTHROPLASTY (Left Knee)  Patient Location: PACU  Anesthesia Type:MAC and Spinal  Level of Consciousness: awake, alert  and oriented  Airway & Oxygen Therapy: Patient Spontanous Breathing and Patient connected to nasal cannula oxygen  Post-op Assessment: Report given to RN and Post -op Vital signs reviewed and stable  Post vital signs: Reviewed and stable  Last Vitals:  Vitals:   05/02/17 1257 05/02/17 1258  BP:    Pulse: (!) 57 (!) 55  Resp: 12 13  Temp:    SpO2: 98% 96%    Last Pain:  Vitals:   05/02/17 1125  TempSrc: Oral         Complications: No apparent anesthesia complications

## 2017-05-02 NOTE — Progress Notes (Signed)
AssistedDr. Brock with left, ultrasound guided, adductor canal block. Side rails up, monitors on throughout procedure. See vital signs in flow sheet. Tolerated Procedure well.  

## 2017-05-03 LAB — CBC
HEMATOCRIT: 34.6 % — AB (ref 39.0–52.0)
Hemoglobin: 12 g/dL — ABNORMAL LOW (ref 13.0–17.0)
MCH: 31.2 pg (ref 26.0–34.0)
MCHC: 34.7 g/dL (ref 30.0–36.0)
MCV: 89.9 fL (ref 78.0–100.0)
Platelets: 208 10*3/uL (ref 150–400)
RBC: 3.85 MIL/uL — ABNORMAL LOW (ref 4.22–5.81)
RDW: 12.9 % (ref 11.5–15.5)
WBC: 14.6 10*3/uL — ABNORMAL HIGH (ref 4.0–10.5)

## 2017-05-03 LAB — BASIC METABOLIC PANEL
Anion gap: 12 (ref 5–15)
BUN: 14 mg/dL (ref 6–20)
CALCIUM: 8.8 mg/dL — AB (ref 8.9–10.3)
CO2: 22 mmol/L (ref 22–32)
CREATININE: 0.97 mg/dL (ref 0.61–1.24)
Chloride: 104 mmol/L (ref 101–111)
GFR calc Af Amer: >60 mL/min (ref >60)
GFR calc non Af Amer: >60 mL/min (ref >60)
GLUCOSE: 144 mg/dL — AB (ref 65–99)
Potassium: 4.2 mmol/L (ref 3.5–5.1)
Sodium: 138 mmol/L (ref 135–145)

## 2017-05-03 MED ORDER — OXYCODONE HCL 5 MG PO TABS: 5.0000 mg | ORAL_TABLET | ORAL | 0 refills | Status: DC | PRN

## 2017-05-03 MED ORDER — METHOCARBAMOL 500 MG PO TABS: 500.0000 mg | ORAL_TABLET | Freq: Four times a day (QID) | ORAL | 0 refills | Status: DC | PRN

## 2017-05-03 MED ORDER — RIVAROXABAN 10 MG PO TABS: 10.0000 mg | ORAL_TABLET | Freq: Every day | ORAL | 0 refills | Status: DC

## 2017-05-03 NOTE — Evaluation (Signed)
Physical Therapy Evaluation Patient Details Name: Marcus Simpson MRN: [id number] DOB: 10/08/1945 Today's Date: 05/03/2017   History of Present Illness  Pt is a 72 y.o. male s/p L TKA.   Clinical Impression  Pt is s/p L TKA resulting in the deficits listed below (See PT problem list). PT will benefit from skilled PT to increase their independence and safety with mobility to allow discharge. Pt able to ambulate in hallway and reported increased pain. Will educate pt on performing exercises during afternoon session. Pt will d/c home family assist.      Follow Up Recommendations Outpatient PT;DC plan and follow up therapy as arranged by surgeon    Equipment Recommendations  None recommended by PT    Recommendations for Other Services       Precautions / Restrictions Precautions Precautions: Fall;Knee Required Braces or Orthoses: Knee Immobilizer - Left Restrictions Weight Bearing Restrictions: Yes LLE Weight Bearing: Weight bearing as tolerated      Mobility  Bed Mobility Overal bed mobility: Needs Assistance Bed Mobility: Supine to Sit     Supine to sit: Supervision     General bed mobility comments: Pt able to move LLE without assistance  Transfers Overall transfer level: Needs assistance Equipment used: Rolling walker (2 wheeled) Transfers: Sit to/from Stand Sit to Stand: Supervision Stand pivot transfers: Min guard       General transfer comment: verbal cues for UE placement, LLE positioning, weight bearing  Ambulation/Gait Ambulation/Gait assistance: Min guard Ambulation Distance (Feet): 140 Feet Assistive device: Rolling walker (2 wheeled) Gait Pattern/deviations: Step-to pattern;Decreased stance time - left;Antalgic     General Gait Details: verbal cues for posture, safe speed, weight bearing  Stairs            Wheelchair Mobility    Modified Rankin (Stroke Patients Only)       Balance Overall balance assessment: No apparent balance  deficits (not formally assessed)                                           Pertinent Vitals/Pain Pain Assessment: 0-10 Pain Score: 7  Pain Location: L knee Pain Descriptors / Indicators: Sore;Aching Pain Intervention(s): Limited activity within patient's tolerance;Repositioned;Ice applied;Monitored during session;Premedicated before session    Home Living Family/patient expects to be discharged to:: Private residence Living Arrangements: Children;Spouse/significant other Available Help at Discharge: Family;Friend(s);Available PRN/intermittently Type of Home: House Home Access: Ramped entrance     Home Layout: One level Home Equipment: Walker - 2 wheels;Walker - 4 wheels;Cane - single point;Crutches      Prior Function Level of Independence: Independent         Comments: Was ambulating w/o AD prior to this surgery. I all ADL's      Hand Dominance   Dominant Hand: Right    Extremity/Trunk Assessment   Upper Extremity Assessment Upper Extremity Assessment: Overall WFL for tasks assessed    Lower Extremity Assessment Lower Extremity Assessment: LLE deficits/detail LLE Deficits / Details: pt able to perform ankle pumps, able to perform SLR, ROM TBA    Cervical / Trunk Assessment Cervical / Trunk Assessment: Normal  Communication   Communication: No difficulties  Cognition Arousal/Alertness: Awake/alert Behavior During Therapy: WFL for tasks assessed/performed Overall Cognitive Status: Within Functional Limits for tasks assessed  General Comments      Exercises Total Joint Exercises Ankle Circles/Pumps: AROM;20 reps;Both   Assessment/Plan    PT Assessment Patient needs continued PT services  PT Problem List Decreased strength;Decreased mobility;Decreased range of motion;Decreased activity tolerance;Decreased coordination;Decreased knowledge of precautions;Decreased knowledge of use of  DME;Decreased balance;Pain       PT Treatment Interventions DME instruction;Therapeutic activities;Gait training;Therapeutic exercise;Patient/family education;Stair training;Functional mobility training    PT Goals (Current goals can be found in the Care Plan section)  Acute Rehab PT Goals Patient Stated Goal: To return home and regain independence PT Goal Formulation: With patient Time For Goal Achievement: 05/10/17 Potential to Achieve Goals: Good    Frequency 7X/week   Barriers to discharge        Co-evaluation               AM-PAC PT "6 Clicks" Daily Activity  Outcome Measure Difficulty turning over in bed (including adjusting bedclothes, sheets and blankets)?: None Difficulty moving from lying on back to sitting on the side of the bed? : None Difficulty sitting down on and standing up from a chair with arms (e.g., wheelchair, bedside commode, etc,.)?: A Little Help needed moving to and from a bed to chair (including a wheelchair)?: A Little Help needed walking in hospital room?: A Little Help needed climbing 3-5 steps with a railing? : A Lot 6 Click Score: 19    End of Session Equipment Utilized During Treatment: Gait belt Activity Tolerance: Patient tolerated treatment well Patient left: in chair;with call bell/phone within reach Nurse Communication: Mobility status PT Visit Diagnosis: Other abnormalities of gait and mobility (R26.89);Pain Pain - Right/Left: Left Pain - part of body: Knee    Time: 0911-0926 PT Time Calculation (min) (ACUTE ONLY): 15 min   Charges:   PT Evaluation $PT Eval Low Complexity: 1 Low     PT G Codes:       [place] Nickole Adamek, SPT  [place] Ashaunte Standley 05/03/2017, 1:01 PM

## 2017-05-03 NOTE — Evaluation (Signed)
Occupational Therapy Evaluation Patient Details Name: Marcus Simpson MRN: [id number] DOB: [birth year] Today's Date: 05/03/2017    History of Present Illness L TKA (Please see EPIC for complete PMH).   Clinical Impression   Pt admitted as above currently overall supervision-Min guard assist for lower body ADL's and functional transfers (Shower and toilet transfers performed) using RW. He has DME if needed and plans to d/c home with PRN family assist . No further acute OT needs identified at this time, will sign off.    Follow Up Recommendations  No OT follow up;Supervision - Intermittent    Equipment Recommendations  None recommended by OT(Pt has all DME at home. No needs identified)    Recommendations for Other Services       Precautions / Restrictions Precautions Precautions: Fall;Knee Required Braces or Orthoses: Knee Immobilizer - Left Restrictions Weight Bearing Restrictions: Yes LLE Weight Bearing: Weight bearing as tolerated      Mobility Bed Mobility Overal bed mobility: (Pt sitting up in chair upon OT arrival today)                Transfers Overall transfer level: Needs assistance Equipment used: Rolling walker (2 wheeled) Transfers: Sit to/from [facility] Sit to Stand: Supervision Stand pivot transfers: Min guard       General transfer comment: Min VC's for safety/sequencing with RW during transfers - pt able to demonstrate after instruction    Balance Overall balance assessment: No apparent balance deficits (not formally assessed)                                         ADL either performed or assessed with clinical judgement   ADL Overall ADL's : Needs assistance/impaired Eating/Feeding: Independent;Sitting   Grooming: Wash/dry hands;Wash/dry face;Standing;Supervision/safety   Upper Body Bathing: Set up;Sitting   Lower Body Bathing: Min guard;Sit to/from stand   Upper Body Dressing : Set up;Sitting    Lower Body Dressing: Supervision/safety;Sit to/from stand Lower Body Dressing Details (indicate cue type and reason): Pt was mod I don/doffing socks in sitting. Supervision/safety for sit to stand to pull up underwear/pants etc Toilet Transfer: Engineer, structural Details (indicate cue type and reason): 3:1 over toilet in bathroom Toileting- Clothing Manipulation and Hygiene: Supervision/safety;Sit to/from stand;Sitting/lateral lean   Tub/ Shower Transfer: Walk-in shower;Ambulation;Rolling walker;Cueing for sequencing;Min guard Tub/Shower Transfer Details (indicate cue type and reason): Pt has grab bars in and outside of shower at home & built in shower seat, used only RW during acute OT session today. Functional mobility during ADLs: Supervision/safety;Cueing for sequencing;Rolling walker General ADL Comments: Pt was assessed for acute OT followed by AD Lretraining session to include shower transfers using RW - Min guard assist & cues for safety; toilet transfer, reviewed set-up at home and recommendations for safety. Pt also sat in chair to don/doff socks while seated. He may need assistance for shoes and states that family can assist PRN. He has DME & denies any further acute OT needs at this time.     Vision Baseline Vision/History: Wears glasses Wears Glasses: At all times Patient Visual Report: No change from baseline       Perception     Praxis      Pertinent Vitals/Pain Pain Assessment: 0-10 Pain Score: 5  Pain Descriptors / Indicators: Sore;Aching Pain Intervention(s): Limited activity within patient's tolerance;Monitored during session;Premedicated before session;Repositioned     Hand Dominance  Right   Extremity/Trunk Assessment Upper Extremity Assessment Upper Extremity Assessment: Overall WFL for tasks assessed   Lower Extremity Assessment Lower Extremity Assessment: Defer to PT evaluation   Cervical / Trunk Assessment Cervical /  Trunk Assessment: Normal   Communication Communication Communication: No difficulties   Cognition Arousal/Alertness: Awake/alert Behavior During Therapy: WFL for tasks assessed/performed Overall Cognitive Status: Within Functional Limits for tasks assessed                                     General Comments       Exercises     Shoulder Instructions      Home Living Family/patient expects to be discharged to:: Private residence Living Arrangements: Children;Spouse/significant other Available Help at Discharge: Family;Friend(s);Available PRN/intermittently Type of Home: House Home Access: Ramped entrance     Home Layout: One level     Bathroom Shower/Tub: Tub/shower unit;Walk-in shower   Bathroom Toilet: Handicapped height Bathroom Accessibility: Yes   Home Equipment: Walker - 2 wheels;Walker - 4 wheels;Cane - single point;Crutches;Shower seat - built in;Grab bars - tub/shower;Hand held Hotel manager          Prior Functioning/Environment Level of Independence: Independent(Pt has DME from spouse surgeries)        Comments: Was ambulating w/o AD prior to this durgery. I all ADL's         OT Problem List: Pain      OT Treatment/Interventions:      OT Goals(Current goals can be found in the care plan section) Acute Rehab OT Goals Patient Stated Goal: Go home tomorrow with family PRN assistance OT Goal Formulation: All assessment and education complete, DC therapy  OT Frequency:     Barriers to D/C:            Co-evaluation              AM-PAC PT "6 Clicks" Daily Activity     Outcome Measure Help from another person eating meals?: None Help from another person taking care of personal grooming?: None Help from another person toileting, which includes using toliet, bedpan, or urinal?: A Little Help from another person bathing (including washing, rinsing, drying)?: A Little Help from another person to put on and taking  off regular upper body clothing?: None Help from another person to put on and taking off regular lower body clothing?: A Little 6 Click Score: 21   End of Session Equipment Utilized During Treatment: Rolling walker;Left knee immobilizer  Activity Tolerance: Patient tolerated treatment well;No increased pain Patient left: in chair;with call bell/phone within reach;with nursing/sitter in room  OT Visit Diagnosis: Pain Pain - Right/Left: Left Pain - part of body: Knee                Time: 4098-1191 OT Time Calculation (min): 22 min Charges:  OT General Charges $OT Visit: 1 Visit OT Evaluation $OT Eval Low Complexity: 1 Low(10 min) OT Treatments $Self Care/Home Management : 8-22 mins(12 min) G-Codes:       Josephine Igo Dixon, OTR/L 05/03/2017, 11:08 AM

## 2017-05-03 NOTE — Discharge Summary (Signed)
Physician Discharge Summary   Patient ID: Marcus Simpson MRN: [id number] DOB/AGE: 74/07/1945 74 y.o.  Admit date: 05/02/2017 Discharge date: 05/04/2017  Primary Diagnosis:  Osteoarthritis Left knee(s)   Admission Diagnoses:  Past Medical History:  Diagnosis Date  . Arthritis   . Bradycardia    asymptomatic   . History of kidney stones   . Hypertension   . Prostate cancer [facility])    Discharge Diagnoses:   Principal Problem:   OA (osteoarthritis) of knee  Estimated body mass index is 30.72 kg/m as calculated from the following:   Height as of this encounter: _0  (1.753 m).   Weight as of this encounter: 94.3 kg (208 lb).  Procedure:  Procedure(s) (LRB): LEFT TOTAL KNEE ARTHROPLASTY (Left)   Consults: None  HPI: Marcus Simpson is a 72 y.o. year old male with end stage OA of his left knee with progressively worsening pain and dysfunction. He has constant pain, with activity and at rest and significant functional deficits with difficulties even with ADLs. He has had extensive non-op management including analgesics, injections of cortisone and viscosupplements, and home exercise program, but remains in significant pain with significant dysfunction. Radiographs show bone on bone arthritis medial and patellofemoral. He presents now for left Total Knee Arthroplasty.    Laboratory Data: Admission on 05/02/2017  Component Date Value Ref Range Status  . WBC 05/03/2017 14.6* 4.0 - 10.5 K/uL Final  . RBC 05/03/2017 3.85* 4.22 - 5.81 MIL/uL Final  . Hemoglobin 05/03/2017 12.0* 13.0 - 17.0 g/dL Final  . HCT 05/03/2017 34.6* 39.0 - 52.0 % Final  . MCV 05/03/2017 89.9  78.0 - 100.0 fL Final  . MCH 05/03/2017 31.2  26.0 - 34.0 pg Final  . MCHC 05/03/2017 34.7  30.0 - 36.0 g/dL Final  . RDW 05/03/2017 12.9  11.5 - 15.5 % Final  . Platelets 05/03/2017 208  150 - 400 K/uL Final   Performed at [facility], [place] [address], [place], [place] [address]  . Sodium  05/03/2017 138  135 - 145 mmol/L Final  . Potassium 05/03/2017 4.2  3.5 - 5.1 mmol/L Final  . Chloride 05/03/2017 104  101 - 111 mmol/L Final  . CO2 05/03/2017 22  22 - 32 mmol/L Final  . Glucose, Bld 05/03/2017 144* 65 - 99 mg/dL Final  . BUN 05/03/2017 14  6 - 20 mg/dL Final  . Creatinine, Ser 05/03/2017 0.97  0.61 - 1.24 mg/dL Final  . Calcium 05/03/2017 8.8* 8.9 - 10.3 mg/dL Final  . GFR calc non Af Amer 05/03/2017 >60  >60 mL/min Final  . GFR calc Af Amer 05/03/2017 >60  >60 mL/min Final   Comment: (NOTE) The eGFR has been calculated using the CKD EPI equation. This calculation has not been validated in all clinical situations. eGFR's persistently <60 mL/min signify possible Chronic Kidney Disease.   Georgiann Hahn gap 05/03/2017 12  5 - 15 Final   Performed at [facility], [place] [address], [place], [place] [address]  Hospital Outpatient Visit on 04/27/2017  Component Date Value Ref Range Status  . MRSA, PCR 04/27/2017 NEGATIVE  NEGATIVE Final  . Staphylococcus aureus 04/27/2017 NEGATIVE  NEGATIVE Final   Comment: (NOTE) The Xpert SA Assay (FDA approved for NASAL specimens in patients 71 years of age and older), is one component of a comprehensive surveillance program. It is not intended to diagnose infection nor to guide or monitor treatment. Performed at [facility], [place]  [address] [place], [place] [address]   . aPTT 04/27/2017 30  24 - 36 seconds Final   Performed at [facility], [place] [address] [place], [place] [address]  . WBC 04/27/2017 7.2  4.0 - 10.5 K/uL Final  . RBC 04/27/2017 4.31  4.22 - 5.81 MIL/uL Final  . Hemoglobin 04/27/2017 13.4  13.0 - 17.0 g/dL Final  . HCT 04/27/2017 39.3  39.0 - 52.0 % Final  . MCV 04/27/2017 91.2  78.0 - 100.0 fL Final  . MCH 04/27/2017 31.1  26.0 - 34.0 pg Final  . MCHC 04/27/2017 34.1  30.0 - 36.0 g/dL Final  . RDW 04/27/2017 12.9  11.5 - 15.5 % Final  . Platelets 04/27/2017 215   150 - 400 K/uL Final   Performed at [facility], [place] [address], [place], [place] [address]  . Sodium 04/27/2017 140  135 - 145 mmol/L Final  . Potassium 04/27/2017 4.1  3.5 - 5.1 mmol/L Final  . Chloride 04/27/2017 107  101 - 111 mmol/L Final  . CO2 04/27/2017 26  22 - 32 mmol/L Final  . Glucose, Bld 04/27/2017 113* 65 - 99 mg/dL Final  . BUN 04/27/2017 15  6 - 20 mg/dL Final  . Creatinine, Ser 04/27/2017 0.93  0.61 - 1.24 mg/dL Final  . Calcium 04/27/2017 8.8* 8.9 - 10.3 mg/dL Final  . Total Protein 04/27/2017 7.1  6.5 - 8.1 g/dL Final  . Albumin 04/27/2017 4.3  3.5 - 5.0 g/dL Final  . AST 04/27/2017 22  15 - 41 U/L Final  . ALT 04/27/2017 17  17 - 63 U/L Final  . Alkaline Phosphatase 04/27/2017 87  38 - 126 U/L Final  . Total Bilirubin 04/27/2017 1.2  0.3 - 1.2 mg/dL Final  . GFR calc non Af Amer 04/27/2017 >60  >60 mL/min Final  . GFR calc Af Amer 04/27/2017 >60  >60 mL/min Final   Comment: (NOTE) The eGFR has been calculated using the CKD EPI equation. This calculation has not been validated in all clinical situations. eGFR's persistently <60 mL/min signify possible Chronic Kidney Disease.   Georgiann Hahn gap 04/27/2017 7  5 - 15 Final   Performed at [facility], [place] [address] [place], [place] [address]  . Prothrombin Time 04/27/2017 13.3  11.4 - 15.2 seconds Final  . INR 04/27/2017 1.02   Final   Performed at [facility], [place] [address], [place], [place] [address]  . ABO/RH(D) 04/27/2017 O POS   Final  . Antibody Screen 04/27/2017 NEG   Final  . Sample Expiration 04/27/2017 05/05/2017   Final  . Extend sample reason 04/27/2017    Final                   Value:NO TRANSFUSIONS OR PREGNANCY IN THE PAST 3 MONTHS Performed at [facility], [place] [address] [place], [place] [address]   . ABO/RH(D) 04/27/2017    Final                   Value:O POS Performed at [facility], [place]  [address] [place], [place] [address]      X-Rays:No results found.  EKG: Orders placed or performed during the hospital encounter of 04/27/17  . EKG 12 lead  . EKG 12 lead     Hospital Course: HOLMES HAYS is a 72 y.o. who was admitted to [facility]. They were brought to the operating room  on 05/02/2017 and underwent Procedure(s): LEFT TOTAL KNEE ARTHROPLASTY.  Patient tolerated the procedure well and was later transferred to the recovery room and then to the orthopaedic floor for postoperative care.  They were given PO and IV analgesics for pain control following their surgery.  They were given 24 hours of postoperative antibiotics of  Anti-infectives (From admission, onward)   Start     Dose/Rate Route Frequency Ordered Stop   05/02/17 2000  ceFAZolin (ANCEF) IVPB 2g/100 mL premix     2 g 200 mL/hr over 30 Minutes Intravenous Every 6 hours 05/02/17 1709 05/03/17 0213   05/02/17 1129  ceFAZolin (ANCEF) IVPB 2g/100 mL premix     2 g 200 mL/hr over 30 Minutes Intravenous On call to O.R. 05/02/17 1129 05/02/17 1406     and started on DVT prophylaxis in the form of Xarelto.   PT and OT were ordered for total joint protocol.  Discharge planning consulted to help with postop disposition and equipment needs.  Patient had a decent night on the evening of surgery.  They started to get up OOB with therapy on day one. Hemovac drain was pulled without difficulty.  Continued to work with therapy into day two.  Dressing was changed on day two and the incision was healing well.  Patient was seen in rounds and was ready to go home on POD 2.   Diet - Cardiac diet Follow up - in 2 weeks Activity - WBAT Disposition - Home Condition Upon Discharge - Stable D/C Meds - See DC Summary DVT Prophylaxis - Xarelto     Allergies as of 05/03/2017      Reactions   Dilaudid [hydromorphone Hcl] Anaphylaxis      Medication List    STOP taking these medications   aspirin 81 MG tablet     ibuprofen 200 MG tablet Commonly known as:  ADVIL,MOTRIN   vitamin B-12 1000 MCG tablet Commonly known as:  CYANOCOBALAMIN     TAKE these medications   amLODipine 5 MG tablet Commonly known as:  NORVASC Take 5 mg by mouth daily.   loperamide 2 MG capsule Commonly known as:  IMODIUM Take 1 capsule (2 mg total) by mouth 2 (two) times daily.   methocarbamol 500 MG tablet Commonly known as:  ROBAXIN Take 1 tablet (500 mg total) by mouth every 6 (six) hours as needed for muscle spasms.   oxyCODONE 5 MG immediate release tablet Commonly known as:  Oxy IR/ROXICODONE Take 1-2 tablets (5-10 mg total) by mouth every 4 (four) hours as needed for moderate pain or severe pain.   rivaroxaban 10 MG Tabs tablet Commonly known as:  XARELTO Take 1 tablet (10 mg total) by mouth daily with breakfast. Take Xarelto for two and a half more weeks following discharge from the hospital, then discontinue Xarelto. Once the patient has completed the Xarelto, they may resume the 81 mg Aspirin. Start taking on:  05/04/2017      Follow-up Information    Gaynelle Arabian, MD. Schedule an appointment as soon as possible for a visit on 05/17/2017.   Specialty:  Orthopedic Surgery Contact information: [address] [place] [address] [phone]           Signed: Arlee Muslim, PA-C Orthopaedic Surgery 05/03/2017, 10:12 PM

## 2017-05-03 NOTE — Plan of Care (Signed)
Plan of care reviewed and discussed with patient.   

## 2017-05-03 NOTE — Progress Notes (Signed)
Physical Therapy Treatment Patient Details Name: Marcus Simpson MRN: [id number] DOB: April 30, 1945 Today's Date: 05/03/2017    History of Present Illness Pt is a 72 y.o. male s/p L TKA.    PT Comments    Pt was in recliner on arrival. Pt was very motivated to walk and reported decreased pain as he ambulated. Pt was educated on and performed exercises, will give handout tomorrow before d/c. Pt returned to bed for ortho tech to place in CPM. Pt will d/c home with family assist, scheduled OPPT later this week.   Follow Up Recommendations  Outpatient PT;DC plan and follow up therapy as arranged by surgeon     Equipment Recommendations  None recommended by PT    Recommendations for Other Services       Precautions / Restrictions Precautions Precautions: Fall;Knee Required Braces or Orthoses: Knee Immobilizer - Left Restrictions LLE Weight Bearing: Weight bearing as tolerated    Mobility  Bed Mobility Overal bed mobility: Needs Assistance Bed Mobility: Sit to Supine     Supine to sit: Supervision Sit to supine: Min guard   General bed mobility comments: OOB in recliner  Transfers Overall transfer level: Needs assistance Equipment used: Rolling walker (2 wheeled) Transfers: Sit to/from Stand Sit to Stand: Supervision Stand pivot transfers: Supervision       General transfer comment: verbal cues for safety, UE placement, RW positioning  Ambulation/Gait Ambulation/Gait assistance: Min guard Ambulation Distance (Feet): 300 Feet Assistive device: Rolling walker (2 wheeled) Gait Pattern/deviations: Step-to pattern;Decreased stance time - left;Antalgic     General Gait Details: verbal cues for posture, gait pattern, weight shifting; discussed speed for safety   Stairs            Wheelchair Mobility    Modified Rankin (Stroke Patients Only)       Balance                                            Cognition Arousal/Alertness:  Awake/alert Behavior During Therapy: WFL for tasks assessed/performed Overall Cognitive Status: Within Functional Limits for tasks assessed                                        Exercises Total Joint Exercises Ankle Circles/Pumps: AROM;20 reps;Both Quad Sets: AROM;10 reps;Left Short Arc Quad: AROM;10 reps;Left Heel Slides: AROM;10 reps;Left Hip ABduction/ADduction: AROM;10 reps;Left Straight Leg Raises: AROM;10 reps;Left Goniometric ROM: measured 88 degrees of AAROM L knee flexion supine    General Comments        Pertinent Vitals/Pain Pain Assessment: 0-10 Pain Score: 7  Pain Location: L knee, decreased with ambulation Pain Descriptors / Indicators: Sore;Aching Pain Intervention(s): Limited activity within patient's tolerance;Repositioned;Ice applied;Monitored during session    [place] expects to be discharged to:: Private residence Living Arrangements: Children;Spouse/significant other Available Help at Discharge: Family;Friend(s);Available PRN/intermittently Type of Home: House Home Access: Ramped entrance   Home Layout: One level Home Equipment: Walker - 2 wheels;Walker - 4 wheels;Cane - single point;Crutches      Prior Function Level of Independence: Independent      Comments: Was ambulating w/o AD prior to this surgery. I all ADL's    PT Goals (current goals can now be found in the care plan section) Acute Rehab PT Goals Patient Stated Goal:  To return home and regain independence PT Goal Formulation: With patient Time For Goal Achievement: 05/10/17 Potential to Achieve Goals: Good Progress towards PT goals: Progressing toward goals    Frequency    7X/week      PT Plan Current plan remains appropriate    Co-evaluation              AM-PAC PT "6 Clicks" Daily Activity  Outcome Measure  Difficulty turning over in bed (including adjusting bedclothes, sheets and blankets)?: None Difficulty moving from lying on  back to sitting on the side of the bed? : None Difficulty sitting down on and standing up from a chair with arms (e.g., wheelchair, bedside commode, etc,.)?: A Little Help needed moving to and from a bed to chair (including a wheelchair)?: A Little Help needed walking in hospital room?: A Little Help needed climbing 3-5 steps with a railing? : A Little 6 Click Score: 20    End of Session Equipment Utilized During Treatment: Gait belt Activity Tolerance: Patient tolerated treatment well Patient left: in bed;with call bell/phone within reach Nurse Communication: Mobility status PT Visit Diagnosis: Other abnormalities of gait and mobility (R26.89);Pain Pain - Right/Left: Left Pain - part of body: Knee     Time: 1347-1406 PT Time Calculation (min) (ACUTE ONLY): 19 min  Charges:  $Therapeutic Exercise: 8-22 mins                    G Codes:      [place] Verniece Encarnacion, SPT   [place] Joshawn Crissman 05/03/2017, 3:24 PM

## 2017-05-03 NOTE — Progress Notes (Signed)
   Subjective: 1 Day Post-Op Procedure(s) (LRB): LEFT TOTAL KNEE ARTHROPLASTY (Left) Patient reports pain as mild.   Patient seen in rounds for Dr. Wynelle Link. Patient is well, but has had some minor complaints of pain in the knee, requiring pain medications We resumed  therapy today. He walked twice with PT. Plan is to go Home after hospital stay.  Objective: Vital signs in last 24 hours: Temp:  [98.1 F (36.7 C)-98.6 F (37 C)] 98.3 F (36.8 C) (02/12 1438) Pulse Rate:  [56-58] 56 (02/12 1438) Resp:  [16-18] 16 (02/12 1438) BP: (132-135)/(62-64) 134/64 (02/12 1438) SpO2:  [94 %-98 %] 95 % (02/12 1438)  Intake/Output from previous day:  Intake/Output Summary (Last 24 hours) at 05/03/2017 2207 Last data filed at 05/03/2017 1827 Gross per 24 hour  Intake 1950 ml  Output 1705 ml  Net 245 ml    Intake/Output this shift: No intake/output data recorded.  Labs: Recent Labs    05/03/17 0536  HGB 12.0*   Recent Labs    05/03/17 0536  WBC 14.6*  RBC 3.85*  HCT 34.6*  PLT 208   Recent Labs    05/03/17 0536  NA 138  K 4.2  CL 104  CO2 22  BUN 14  CREATININE 0.97  GLUCOSE 144*  CALCIUM 8.8*   No results for input(s): LABPT, INR in the last 72 hours.  EXAM General - Patient is Alert, Appropriate and Oriented Extremity - Neurovascular intact Sensation intact distally Intact pulses distally Dorsiflexion/Plantar flexion intact Dressing - dressing C/D/I Motor Function - intact, moving foot and toes well on exam.  Hemovac pulled without difficulty.  Past Medical History:  Diagnosis Date  . Arthritis   . Bradycardia    asymptomatic   . History of kidney stones   . Hypertension   . Prostate cancer ([place])     Assessment/Plan: 1 Day Post-Op Procedure(s) (LRB): LEFT TOTAL KNEE ARTHROPLASTY (Left) Principal Problem:   OA (osteoarthritis) of knee  Estimated body mass index is 30.72 kg/m as calculated from the following:   Height as of this encounter: 5\' 9"   (1.753 m).   Weight as of this encounter: 94.3 kg (208 lb). Advance diet Up with therapy Plan for discharge tomorrow  DVT Prophylaxis - Xarelto Weight-Bearing as tolerated to left leg D/C O2 and Pulse OX and try on Room Air  Arlee Muslim, PA-C Orthopaedic Surgery 05/03/2017, 10:07 PM

## 2017-05-03 NOTE — Progress Notes (Signed)
Spoke with patient at bedside. Confirmed plan for OP PT, already arranged. Has RW and 3n1. [phone] 

## 2017-05-03 NOTE — Discharge Instructions (Signed)
° °Dr. Frank Aluisio °Total Joint Specialist °[facility] °[address] °[place], [place] [address] °[phone] ° °TOTAL KNEE REPLACEMENT POSTOPERATIVE DIRECTIONS ° °Knee Rehabilitation, Guidelines Following Surgery  °Results after knee surgery are often greatly improved when you follow the exercise, range of motion and muscle strengthening exercises prescribed by your doctor. Safety measures are also important to protect the knee from further injury. Any time any of these exercises cause you to have increased pain or swelling in your knee joint, decrease the amount until you are comfortable again and slowly increase them. If you have problems or questions, call your caregiver or physical therapist for advice.  ° °HOME CARE INSTRUCTIONS  °Remove items at home which could result in a fall. This includes throw rugs or furniture in walking pathways.  °· ICE to the affected knee every three hours for 30 minutes at a time and then as needed for pain and swelling.  Continue to use ice on the knee for pain and swelling from surgery. You may notice swelling that will progress down to the foot and ankle.  This is normal after surgery.  Elevate the leg when you are not up walking on it.   °· Continue to use the breathing machine which will help keep your temperature down.  It is common for your temperature to cycle up and down following surgery, especially at night when you are not up moving around and exerting yourself.  The breathing machine keeps your lungs expanded and your temperature down. °· Do not place pillow under knee, focus on keeping the knee straight while resting ° °DIET °You may resume your previous home diet once your are discharged from the hospital. ° °DRESSING / WOUND CARE / SHOWERING °You may shower 3 days after surgery, but keep the wounds dry during showering.  You may use an occlusive plastic wrap (Press'n Seal for example), NO SOAKING/SUBMERGING IN THE BATHTUB.  If the bandage gets  wet, change with a clean dry gauze.  If the incision gets wet, pat the wound dry with a clean towel. °You may start showering once you are discharged home but do not submerge the incision under water. Just pat the incision dry and apply a dry gauze dressing on daily. °Change the surgical dressing daily and reapply a dry dressing each time. ° °ACTIVITY °Walk with your walker as instructed. °Use walker as long as suggested by your caregivers. °Avoid periods of inactivity such as sitting longer than an hour when not asleep. This helps prevent blood clots.  °You may resume a sexual relationship in one month or when given the OK by your doctor.  °You may return to work once you are cleared by your doctor.  °Do not drive a car for 6 weeks or until released by you surgeon.  °Do not drive while taking narcotics. ° °WEIGHT BEARING °Weight bearing as tolerated with assist device (walker, cane, etc) as directed, use it as long as suggested by your surgeon or therapist, typically at least 4-6 weeks. ° °POSTOPERATIVE CONSTIPATION PROTOCOL °Constipation - defined medically as fewer than three stools per week and severe constipation as less than one stool per week. ° °One of the most common issues patients have following surgery is constipation.  Even if you have a regular bowel pattern at home, your normal regimen is likely to be disrupted due to multiple reasons following surgery.  Combination of anesthesia, postoperative narcotics, change in appetite and fluid intake all can affect your bowels.    In order to avoid complications following surgery, here are some recommendations in order to help you during your recovery period. ° °Colace (docusate) - Pick up an over-the-counter form of Colace or another stool softener and take twice a day as long as you are requiring postoperative pain medications.  Take with a full glass of water daily.  If you experience loose stools or diarrhea, hold the colace until you stool forms back up.  If  your symptoms do not get better within 1 week or if they get worse, check with your doctor. ° °Dulcolax (bisacodyl) - Pick up over-the-counter and take as directed by the product packaging as needed to assist with the movement of your bowels.  Take with a full glass of water.  Use this product as needed if not relieved by Colace only.  ° °MiraLax (polyethylene glycol) - Pick up over-the-counter to have on hand.  MiraLax is a solution that will increase the amount of water in your bowels to assist with bowel movements.  Take as directed and can mix with a glass of water, juice, soda, coffee, or tea.  Take if you go more than two days without a movement. °Do not use MiraLax more than once per day. Call your doctor if you are still constipated or irregular after using this medication for 7 days in a row. ° °If you continue to have problems with postoperative constipation, please contact the office for further assistance and recommendations.  If you experience "the worst abdominal pain ever" or develop nausea or vomiting, please contact the office immediatly for further recommendations for treatment. ° °ITCHING ° If you experience itching with your medications, try taking only a single pain pill, or even half a pain pill at a time.  You can also use Benadryl over the counter for itching or also to help with sleep.  ° °TED HOSE STOCKINGS °Wear the elastic stockings on both legs for three weeks following surgery during the day but you may remove then at night for sleeping. ° °MEDICATIONS °See your medication summary on the “After Visit Summary” that the nursing staff will review with you prior to discharge.  You may have some home medications which will be placed on hold until you complete the course of blood thinner medication.  It is important for you to complete the blood thinner medication as prescribed by your surgeon.  Continue your approved medications as instructed at time of discharge. ° °PRECAUTIONS °If you  experience chest pain or shortness of breath - call 911 immediately for transfer to the hospital emergency department.  °If you develop a fever greater that 101 F, purulent drainage from wound, increased redness or drainage from wound, foul odor from the wound/dressing, or calf pain - CONTACT YOUR SURGEON.   °                                                °FOLLOW-UP APPOINTMENTS °Make sure you keep all of your appointments after your operation with your surgeon and caregivers. You should call the office at the above phone number and make an appointment for approximately two weeks after the date of your surgery or on the date instructed by your surgeon outlined in the "After Visit Summary". ° ° °RANGE OF MOTION AND STRENGTHENING EXERCISES  °Rehabilitation of the knee is important following a knee injury or   an operation. After just a few days of immobilization, the muscles of the thigh which control the knee become weakened and shrink (atrophy). Knee exercises are designed to build up the tone and strength of the thigh muscles and to improve knee motion. Often times heat used for twenty to thirty minutes before working out will loosen up your tissues and help with improving the range of motion but do not use heat for the first two weeks following surgery. These exercises can be done on a training (exercise) mat, on the floor, on a table or on a bed. Use what ever works the best and is most comfortable for you Knee exercises include:  °Leg Lifts - While your knee is still immobilized in a splint or cast, you can do straight leg raises. Lift the leg to 60 degrees, hold for 3 sec, and slowly lower the leg. Repeat 10-20 times 2-3 times daily. Perform this exercise against resistance later as your knee gets better.  °Quad and Hamstring Sets - Tighten up the muscle on the front of the thigh (Quad) and hold for 5-10 sec. Repeat this 10-20 times hourly. Hamstring sets are done by pushing the foot backward against an object  and holding for 5-10 sec. Repeat as with quad sets.  °· Leg Slides: Lying on your back, slowly slide your foot toward your buttocks, bending your knee up off the floor (only go as far as is comfortable). Then slowly slide your foot back down until your leg is flat on the floor again. °· Angel Wings: Lying on your back spread your legs to the side as far apart as you can without causing discomfort.  °A rehabilitation program following serious knee injuries can speed recovery and prevent re-injury in the future due to weakened muscles. Contact your doctor or a physical therapist for more information on knee rehabilitation.  ° °IF YOU ARE TRANSFERRED TO A SKILLED REHAB FACILITY °If the patient is transferred to a skilled rehab facility following release from the hospital, a list of the current medications will be sent to the facility for the patient to continue.  When discharged from the skilled rehab facility, please have the facility set up the patient's Home Health Physical Therapy prior to being released. Also, the skilled facility will be responsible for providing the patient with their medications at time of release from the facility to include their pain medication, the muscle relaxants, and their blood thinner medication. If the patient is still at the rehab facility at time of the two week follow up appointment, the skilled rehab facility will also need to assist the patient in arranging follow up appointment in our office and any transportation needs. ° °MAKE SURE YOU:  °Understand these instructions.  °Get help right away if you are not doing well or get worse.  ° ° °Pick up stool softner and laxative for home use following surgery while on pain medications. °Do not submerge incision under water. °Please use good hand washing techniques while changing dressing each day. °May shower starting three days after surgery. °Please use a clean towel to pat the incision dry following showers. °Continue to use ice for  pain and swelling after surgery. °Do not use any lotions or creams on the incision until instructed by your surgeon. ° °Take Xarelto for two and a half more weeks following discharge from the hospital, then discontinue Xarelto. °Once the patient has completed the Xarelto, they may resume the 81 mg Aspirin. ° ° ° °Information on   my medicine - XARELTO® (Rivaroxaban) ° °Why was Xarelto® prescribed for you? °Xarelto® was prescribed for you to reduce the risk of blood clots forming after orthopedic surgery. The medical term for these abnormal blood clots is venous thromboembolism (VTE). ° °What do you need to know about xarelto® ? °Take your Xarelto® ONCE DAILY at the same time every day. °You may take it either with or without food. ° °If you have difficulty swallowing the tablet whole, you may crush it and mix in applesauce just prior to taking your dose. ° °Take Xarelto® exactly as prescribed by your doctor and DO NOT stop taking Xarelto® without talking to the doctor who prescribed the medication.  Stopping without other VTE prevention medication to take the place of Xarelto® may increase your risk of developing a clot. ° °After discharge, you should have regular check-up appointments with your healthcare provider that is prescribing your Xarelto®.   ° °What do you do if you miss a dose? °If you miss a dose, take it as soon as you remember on the same day then continue your regularly scheduled once daily regimen the next day. Do not take two doses of Xarelto® on the same day.  ° °Important Safety Information °A possible side effect of Xarelto® is bleeding. You should call your healthcare provider right away if you experience any of the following: °? Bleeding from an injury or your nose that does not stop. °? Unusual colored urine (red or dark brown) or unusual colored stools (red or black). °? Unusual bruising for unknown reasons. °? A serious fall or if you hit your head (even if there is no bleeding). ° °Some  medicines may interact with Xarelto® and might increase your risk of bleeding while on Xarelto®. To help avoid this, consult your healthcare provider or pharmacist prior to using any new prescription or non-prescription medications, including herbals, vitamins, non-steroidal anti-inflammatory drugs (NSAIDs) and supplements. ° °This website has more information on Xarelto®: www.xarelto.com. ° ° °

## 2017-05-04 LAB — CBC
HEMATOCRIT: 33.7 % — AB (ref 39.0–52.0)
Hemoglobin: 11.8 g/dL — ABNORMAL LOW (ref 13.0–17.0)
MCH: 31.8 pg (ref 26.0–34.0)
MCHC: 35 g/dL (ref 30.0–36.0)
MCV: 90.8 fL (ref 78.0–100.0)
Platelets: 219 10*3/uL (ref 150–400)
RBC: 3.71 MIL/uL — AB (ref 4.22–5.81)
RDW: 13.3 % (ref 11.5–15.5)
WBC: 19.4 10*3/uL — AB (ref 4.0–10.5)

## 2017-05-04 LAB — BASIC METABOLIC PANEL
ANION GAP: 10 (ref 5–15)
BUN: 18 mg/dL (ref 6–20)
CO2: 24 mmol/L (ref 22–32)
Calcium: 9 mg/dL (ref 8.9–10.3)
Chloride: 102 mmol/L (ref 101–111)
Creatinine, Ser: 0.91 mg/dL (ref 0.61–1.24)
GFR calc Af Amer: >60 mL/min (ref >60)
GFR calc non Af Amer: >60 mL/min (ref >60)
Glucose, Bld: 141 mg/dL — ABNORMAL HIGH (ref 65–99)
POTASSIUM: 4.4 mmol/L (ref 3.5–5.1)
Sodium: 136 mmol/L (ref 135–145)

## 2017-05-04 NOTE — Progress Notes (Signed)
Physical Therapy Treatment Patient Details Name: Marcus Simpson MRN: [id number] DOB: 14-Mar-1946 Today's Date: 05/04/2017    History of Present Illness Pt is a 72 y.o. male s/p L TKA.     PT Comments    Pt performed exercises in bed prior to ambulating, given handout. Pt ambulated in hall again. Pt had no further questions for PT. Pt will d/c to home with family assist today, with OPPT scheduled later this week.    Follow Up Recommendations  Follow surgeon's recommendation for DC plan and follow-up therapies;Outpatient PT     Equipment Recommendations  None recommended by PT    Recommendations for Other Services       Precautions / Restrictions Precautions Precautions: Fall;Knee Precaution Comments: pt able to actively SLR without lag, ambulated without KI today Restrictions LLE Weight Bearing: Weight bearing as tolerated    Mobility  Bed Mobility Overal bed mobility: Needs Assistance Bed Mobility: Sit to Supine     Supine to sit: Supervision     General bed mobility comments: Pt able to move LE to EOB without assist  Transfers   Equipment used: Rolling walker (2 wheeled) Transfers: Sit to/from Stand Sit to Stand: Supervision         General transfer comment: verbal cues for safety, UE placement  Ambulation/Gait Ambulation/Gait assistance: Min guard Ambulation Distance (Feet): 300 Feet Assistive device: Rolling walker (2 wheeled) Gait Pattern/deviations: Step-to pattern;Decreased stance time - left;Antalgic     General Gait Details: verbal cues for posture, gait pattern, discussed speed for safety   Stairs            Wheelchair Mobility    Modified Rankin (Stroke Patients Only)       Balance                                            Cognition Arousal/Alertness: Awake/alert Behavior During Therapy: WFL for tasks assessed/performed Overall Cognitive Status: Within Functional Limits for tasks assessed                                         Exercises Total Joint Exercises Ankle Circles/Pumps: AROM;20 reps;Both Quad Sets: AROM;10 reps;Left Short Arc Quad: AROM;10 reps;Left Heel Slides: AROM;10 reps;Left Hip ABduction/ADduction: AROM;10 reps;Left Straight Leg Raises: AROM;10 reps;Left    General Comments        Pertinent Vitals/Pain Pain Assessment: 0-10 Pain Score: 7  Pain Location: L knee Pain Descriptors / Indicators: Sore;Aching Pain Intervention(s): Limited activity within patient's tolerance;Repositioned;Ice applied;Monitored during session;Premedicated before session    Home Living                      Prior Function            PT Goals (current goals can now be found in the care plan section) Progress towards PT goals: Progressing toward goals    Frequency    7X/week      PT Plan Current plan remains appropriate    Co-evaluation              AM-PAC PT "6 Clicks" Daily Activity  Outcome Measure  Difficulty turning over in bed (including adjusting bedclothes, sheets and blankets)?: None Difficulty moving from lying on back to sitting on the side of the  bed? : None Difficulty sitting down on and standing up from a chair with arms (e.g., wheelchair, bedside commode, etc,.)?: A Little Help needed moving to and from a bed to chair (including a wheelchair)?: A Little Help needed walking in hospital room?: A Little Help needed climbing 3-5 steps with a railing? : A Little 6 Click Score: 20    End of Session   Activity Tolerance: Patient tolerated treatment well Patient left: in chair;with call bell/phone within reach Nurse Communication: Mobility status PT Visit Diagnosis: Other abnormalities of gait and mobility (R26.89);Pain Pain - Right/Left: Left Pain - part of body: Knee     Time: 0857-0920 PT Time Calculation (min) (ACUTE ONLY): 23 min  Charges:  $Gait Training: 8-22 mins $Therapeutic Exercise: 8-22 mins                    G  Codes:      [place] Rocky Gladden, SPT  [place] Ronda Rajkumar 05/04/2017, 1:27 PM

## 2017-05-04 NOTE — Plan of Care (Signed)
Adequately meeting goals of care.

## 2017-05-04 NOTE — Progress Notes (Signed)
   Subjective: 2 Days Post-Op Procedure(s) (LRB): LEFT TOTAL KNEE ARTHROPLASTY (Left) Patient reports pain as mild.   Patient seen in rounds for Dr. Wynelle Link. Doing well. Patient is well, and has had no acute complaints or problems Patient is ready to go home  Objective: Vital signs in last 24 hours: Temp:  [97.9 F (36.6 C)-98.4 F (36.9 C)] 98.4 F (36.9 C) (02/13 0602) Pulse Rate:  [56-61] 56 (02/13 0602) Resp:  [16-18] 16 (02/13 0602) BP: (129-139)/(62-72) 129/69 (02/13 0602) SpO2:  [94 %-95 %] 95 % (02/13 0602)  Intake/Output from previous day:  Intake/Output Summary (Last 24 hours) at 05/04/2017 0702 Last data filed at 05/04/2017 0603 Gross per 24 hour  Intake 850 ml  Output 1150 ml  Net -300 ml    Intake/Output this shift: No intake/output data recorded.  Labs: Recent Labs    05/03/17 0536 05/04/17 0525  HGB 12.0* 11.8*   Recent Labs    05/03/17 0536 05/04/17 0525  WBC 14.6* 19.4*  RBC 3.85* 3.71*  HCT 34.6* 33.7*  PLT 208 219   Recent Labs    05/03/17 0536 05/04/17 0525  NA 138 136  K 4.2 4.4  CL 104 102  CO2 22 24  BUN 14 18  CREATININE 0.97 0.91  GLUCOSE 144* 141*  CALCIUM 8.8* 9.0   No results for input(s): LABPT, INR in the last 72 hours.  EXAM: General - Patient is Alert and Appropriate Extremity - Neurovascular intact Sensation intact distally Intact pulses distally Incision - clean, dry Motor Function - intact, moving foot and toes well on exam.   Assessment/Plan: 2 Days Post-Op Procedure(s) (LRB): LEFT TOTAL KNEE ARTHROPLASTY (Left) Procedure(s) (LRB): LEFT TOTAL KNEE ARTHROPLASTY (Left) Past Medical History:  Diagnosis Date  . Arthritis   . Bradycardia    asymptomatic   . History of kidney stones   . Hypertension   . Prostate cancer ([place])    Principal Problem:   OA (osteoarthritis) of knee  Estimated body mass index is 30.72 kg/m as calculated from the following:   Height as of this encounter: 5\' 9"  (1.753 m).  Weight as of this encounter: 94.3 kg (208 lb). Up with therapy Diet - Cardiac diet Follow up - in 2 weeks Activity - WBAT Disposition - Home Condition Upon Discharge - Stable D/C Meds - See DC Summary DVT Prophylaxis - Xarelto  Arlee Muslim, PA-C Orthopaedic Surgery 05/04/2017, 7:02 AM

## 2017-05-04 NOTE — Progress Notes (Signed)
Patient discharged to home w/ friend. Given all belongings, instructions, prescriptions. All questions answered. Escorted to pov via w/c.

## 2018-02-14 ENCOUNTER — Ambulatory Visit: Payer: Medicare Other | Admitting: Urology

## 2018-02-14 ENCOUNTER — Other Ambulatory Visit (HOSPITAL_COMMUNITY)
Admission: RE | Admit: 2018-02-14 | Discharge: 2018-02-14 | Disposition: A | Discharge status: Home / Self Care | Payer: Medicare Other | Source: Ambulatory Visit | Attending: Urology | Admitting: Urology

## 2018-02-14 ENCOUNTER — Ambulatory Visit (HOSPITAL_COMMUNITY)
Admission: RE | Admit: 2018-02-14 | Discharge: 2018-02-14 | Disposition: A | Discharge status: Home / Self Care | Payer: Medicare Other | Source: Ambulatory Visit | Attending: Urology | Admitting: Urology

## 2018-02-14 ENCOUNTER — Other Ambulatory Visit: Payer: Self-pay | Admitting: Urology

## 2018-02-14 DIAGNOSIS — N5201 Erectile dysfunction due to arterial insufficiency: Secondary | ICD-10-CM

## 2018-02-14 DIAGNOSIS — C61 Malignant neoplasm of prostate: Secondary | ICD-10-CM

## 2018-02-14 DIAGNOSIS — N200 Calculus of kidney: Secondary | ICD-10-CM | POA: Insufficient documentation

## 2018-02-16 LAB — URINE CULTURE

## 2018-09-26 ENCOUNTER — Ambulatory Visit (INDEPENDENT_AMBULATORY_CARE_PROVIDER_SITE_OTHER): Payer: Medicare Other | Admitting: Urology

## 2018-09-26 DIAGNOSIS — C61 Malignant neoplasm of prostate: Secondary | ICD-10-CM | POA: Diagnosis not present

## 2019-03-13 ENCOUNTER — Encounter: Payer: Self-pay | Admitting: Urology

## 2019-04-18 DIAGNOSIS — I10 Essential (primary) hypertension: Secondary | ICD-10-CM | POA: Diagnosis not present

## 2019-04-25 DIAGNOSIS — R42 Dizziness and giddiness: Secondary | ICD-10-CM | POA: Diagnosis not present

## 2019-04-25 DIAGNOSIS — Z299 Encounter for prophylactic measures, unspecified: Secondary | ICD-10-CM | POA: Diagnosis not present

## 2019-04-25 DIAGNOSIS — I10 Essential (primary) hypertension: Secondary | ICD-10-CM | POA: Diagnosis not present

## 2019-04-25 DIAGNOSIS — I779 Disorder of arteries and arterioles, unspecified: Secondary | ICD-10-CM | POA: Diagnosis not present

## 2019-04-30 DIAGNOSIS — R42 Dizziness and giddiness: Secondary | ICD-10-CM | POA: Diagnosis not present

## 2019-04-30 DIAGNOSIS — I6523 Occlusion and stenosis of bilateral carotid arteries: Secondary | ICD-10-CM | POA: Diagnosis not present

## 2019-05-20 DIAGNOSIS — I10 Essential (primary) hypertension: Secondary | ICD-10-CM | POA: Diagnosis not present

## 2019-06-19 DIAGNOSIS — I10 Essential (primary) hypertension: Secondary | ICD-10-CM | POA: Diagnosis not present

## 2019-07-20 DIAGNOSIS — I10 Essential (primary) hypertension: Secondary | ICD-10-CM | POA: Diagnosis not present

## 2019-07-27 DIAGNOSIS — Z1211 Encounter for screening for malignant neoplasm of colon: Secondary | ICD-10-CM | POA: Diagnosis not present

## 2019-07-27 DIAGNOSIS — I10 Essential (primary) hypertension: Secondary | ICD-10-CM | POA: Diagnosis not present

## 2019-07-27 DIAGNOSIS — R5383 Other fatigue: Secondary | ICD-10-CM | POA: Diagnosis not present

## 2019-07-27 DIAGNOSIS — Z0000 Encounter for general adult medical examination without abnormal findings: Secondary | ICD-10-CM | POA: Diagnosis not present

## 2019-07-27 DIAGNOSIS — Z79899 Other long term (current) drug therapy: Secondary | ICD-10-CM | POA: Diagnosis not present

## 2019-07-27 DIAGNOSIS — Z299 Encounter for prophylactic measures, unspecified: Secondary | ICD-10-CM | POA: Diagnosis not present

## 2019-07-27 DIAGNOSIS — E7800 Pure hypercholesterolemia, unspecified: Secondary | ICD-10-CM | POA: Diagnosis not present

## 2019-07-27 DIAGNOSIS — Z7189 Other specified counseling: Secondary | ICD-10-CM | POA: Diagnosis not present

## 2019-07-31 DIAGNOSIS — Z79899 Other long term (current) drug therapy: Secondary | ICD-10-CM | POA: Diagnosis not present

## 2019-07-31 DIAGNOSIS — E7800 Pure hypercholesterolemia, unspecified: Secondary | ICD-10-CM | POA: Diagnosis not present

## 2019-07-31 DIAGNOSIS — R5383 Other fatigue: Secondary | ICD-10-CM | POA: Diagnosis not present

## 2019-11-20 DIAGNOSIS — Z299 Encounter for prophylactic measures, unspecified: Secondary | ICD-10-CM | POA: Diagnosis not present

## 2019-11-20 DIAGNOSIS — Z713 Dietary counseling and surveillance: Secondary | ICD-10-CM | POA: Diagnosis not present

## 2019-11-20 DIAGNOSIS — I779 Disorder of arteries and arterioles, unspecified: Secondary | ICD-10-CM | POA: Diagnosis not present

## 2019-11-20 DIAGNOSIS — I10 Essential (primary) hypertension: Secondary | ICD-10-CM | POA: Diagnosis not present

## 2020-01-14 DIAGNOSIS — Z87891 Personal history of nicotine dependence: Secondary | ICD-10-CM | POA: Diagnosis not present

## 2020-01-14 DIAGNOSIS — S90229A Contusion of unspecified lesser toe(s) with damage to nail, initial encounter: Secondary | ICD-10-CM | POA: Diagnosis not present

## 2020-01-14 DIAGNOSIS — Z23 Encounter for immunization: Secondary | ICD-10-CM | POA: Diagnosis not present

## 2020-01-14 DIAGNOSIS — I779 Disorder of arteries and arterioles, unspecified: Secondary | ICD-10-CM | POA: Diagnosis not present

## 2020-01-14 DIAGNOSIS — Z299 Encounter for prophylactic measures, unspecified: Secondary | ICD-10-CM | POA: Diagnosis not present

## 2020-01-14 DIAGNOSIS — I10 Essential (primary) hypertension: Secondary | ICD-10-CM | POA: Diagnosis not present

## 2020-01-16 DIAGNOSIS — I10 Essential (primary) hypertension: Secondary | ICD-10-CM | POA: Diagnosis not present

## 2020-01-16 DIAGNOSIS — S90229A Contusion of unspecified lesser toe(s) with damage to nail, initial encounter: Secondary | ICD-10-CM | POA: Diagnosis not present

## 2020-01-16 DIAGNOSIS — Z299 Encounter for prophylactic measures, unspecified: Secondary | ICD-10-CM | POA: Diagnosis not present

## 2020-01-16 DIAGNOSIS — I739 Peripheral vascular disease, unspecified: Secondary | ICD-10-CM | POA: Diagnosis not present

## 2020-01-21 DIAGNOSIS — M79604 Pain in right leg: Secondary | ICD-10-CM | POA: Diagnosis not present

## 2020-01-21 DIAGNOSIS — I779 Disorder of arteries and arterioles, unspecified: Secondary | ICD-10-CM | POA: Diagnosis not present

## 2020-01-21 DIAGNOSIS — I10 Essential (primary) hypertension: Secondary | ICD-10-CM | POA: Diagnosis not present

## 2020-01-21 DIAGNOSIS — M79661 Pain in right lower leg: Secondary | ICD-10-CM | POA: Diagnosis not present

## 2020-01-21 DIAGNOSIS — S99921A Unspecified injury of right foot, initial encounter: Secondary | ICD-10-CM | POA: Diagnosis not present

## 2020-01-21 DIAGNOSIS — Z299 Encounter for prophylactic measures, unspecified: Secondary | ICD-10-CM | POA: Diagnosis not present

## 2020-03-03 DIAGNOSIS — I779 Disorder of arteries and arterioles, unspecified: Secondary | ICD-10-CM | POA: Diagnosis not present

## 2020-03-03 DIAGNOSIS — Z299 Encounter for prophylactic measures, unspecified: Secondary | ICD-10-CM | POA: Diagnosis not present

## 2020-03-03 DIAGNOSIS — I10 Essential (primary) hypertension: Secondary | ICD-10-CM | POA: Diagnosis not present

## 2020-03-31 DIAGNOSIS — I739 Peripheral vascular disease, unspecified: Secondary | ICD-10-CM | POA: Diagnosis not present

## 2020-03-31 DIAGNOSIS — I779 Disorder of arteries and arterioles, unspecified: Secondary | ICD-10-CM | POA: Diagnosis not present

## 2020-03-31 DIAGNOSIS — Z87891 Personal history of nicotine dependence: Secondary | ICD-10-CM | POA: Diagnosis not present

## 2020-03-31 DIAGNOSIS — J069 Acute upper respiratory infection, unspecified: Secondary | ICD-10-CM | POA: Diagnosis not present

## 2020-03-31 DIAGNOSIS — Z299 Encounter for prophylactic measures, unspecified: Secondary | ICD-10-CM | POA: Diagnosis not present

## 2020-04-08 DIAGNOSIS — U071 COVID-19: Secondary | ICD-10-CM | POA: Diagnosis not present

## 2020-06-05 DIAGNOSIS — Z299 Encounter for prophylactic measures, unspecified: Secondary | ICD-10-CM | POA: Diagnosis not present

## 2020-06-05 DIAGNOSIS — I10 Essential (primary) hypertension: Secondary | ICD-10-CM | POA: Diagnosis not present

## 2020-06-05 DIAGNOSIS — I739 Peripheral vascular disease, unspecified: Secondary | ICD-10-CM | POA: Diagnosis not present

## 2020-06-05 DIAGNOSIS — I779 Disorder of arteries and arterioles, unspecified: Secondary | ICD-10-CM | POA: Diagnosis not present

## 2020-07-07 DIAGNOSIS — Z299 Encounter for prophylactic measures, unspecified: Secondary | ICD-10-CM | POA: Diagnosis not present

## 2020-07-07 DIAGNOSIS — W57XXXA Bitten or stung by nonvenomous insect and other nonvenomous arthropods, initial encounter: Secondary | ICD-10-CM | POA: Diagnosis not present

## 2020-07-07 DIAGNOSIS — I10 Essential (primary) hypertension: Secondary | ICD-10-CM | POA: Diagnosis not present

## 2020-07-18 DIAGNOSIS — Z299 Encounter for prophylactic measures, unspecified: Secondary | ICD-10-CM | POA: Diagnosis not present

## 2020-07-18 DIAGNOSIS — I10 Essential (primary) hypertension: Secondary | ICD-10-CM | POA: Diagnosis not present

## 2020-07-18 DIAGNOSIS — I779 Disorder of arteries and arterioles, unspecified: Secondary | ICD-10-CM | POA: Diagnosis not present

## 2020-07-18 DIAGNOSIS — I739 Peripheral vascular disease, unspecified: Secondary | ICD-10-CM | POA: Diagnosis not present

## 2020-07-18 DIAGNOSIS — L550 Sunburn of first degree: Secondary | ICD-10-CM | POA: Diagnosis not present

## 2020-07-19 DIAGNOSIS — M159 Polyosteoarthritis, unspecified: Secondary | ICD-10-CM | POA: Diagnosis not present

## 2020-07-19 DIAGNOSIS — E7800 Pure hypercholesterolemia, unspecified: Secondary | ICD-10-CM | POA: Diagnosis not present

## 2020-07-19 DIAGNOSIS — I10 Essential (primary) hypertension: Secondary | ICD-10-CM | POA: Diagnosis not present

## 2020-07-19 DIAGNOSIS — M109 Gout, unspecified: Secondary | ICD-10-CM | POA: Diagnosis not present

## 2020-08-04 DIAGNOSIS — E7800 Pure hypercholesterolemia, unspecified: Secondary | ICD-10-CM | POA: Diagnosis not present

## 2020-08-04 DIAGNOSIS — Z79899 Other long term (current) drug therapy: Secondary | ICD-10-CM | POA: Diagnosis not present

## 2020-08-04 DIAGNOSIS — Z0000 Encounter for general adult medical examination without abnormal findings: Secondary | ICD-10-CM | POA: Diagnosis not present

## 2020-08-04 DIAGNOSIS — I10 Essential (primary) hypertension: Secondary | ICD-10-CM | POA: Diagnosis not present

## 2020-08-04 DIAGNOSIS — Z7189 Other specified counseling: Secondary | ICD-10-CM | POA: Diagnosis not present

## 2020-08-04 DIAGNOSIS — R5383 Other fatigue: Secondary | ICD-10-CM | POA: Diagnosis not present

## 2020-08-04 DIAGNOSIS — Z299 Encounter for prophylactic measures, unspecified: Secondary | ICD-10-CM | POA: Diagnosis not present

## 2020-09-23 ENCOUNTER — Ambulatory Visit: Payer: Medicare Other | Admitting: Urology

## 2020-09-23 ENCOUNTER — Encounter: Payer: Self-pay | Admitting: Urology

## 2020-09-23 ENCOUNTER — Other Ambulatory Visit: Payer: Self-pay

## 2020-09-23 VITALS — BP 127/73 | HR 67

## 2020-09-23 DIAGNOSIS — R310 Gross hematuria: Secondary | ICD-10-CM | POA: Diagnosis not present

## 2020-09-23 DIAGNOSIS — C61 Malignant neoplasm of prostate: Secondary | ICD-10-CM | POA: Diagnosis not present

## 2020-09-23 LAB — MICROSCOPIC EXAMINATION
Bacteria, UA: NONE SEEN
RBC: NONE SEEN /hpf (ref 0–2)
Renal Epithel, UA: NONE SEEN /hpf

## 2020-09-23 LAB — URINALYSIS, ROUTINE W REFLEX MICROSCOPIC
Bilirubin, UA: NEGATIVE
Glucose, UA: NEGATIVE
Ketones, UA: NEGATIVE
Nitrite, UA: NEGATIVE
Protein,UA: NEGATIVE
RBC, UA: NEGATIVE
Specific Gravity, UA: 1.015 (ref 1.005–1.030)
Urobilinogen, Ur: 0.2 mg/dL (ref 0.2–1.0)
pH, UA: 5.5 (ref 5.0–7.5)

## 2020-09-23 NOTE — Progress Notes (Signed)
Urological Symptom Review  Patient is experiencing the following symptoms: Frequent urination Hard to postpone urination Burning/pain with urination Get up at night to urinate Leakage of urine Stream starts and stops Trouble starting stream Blood in urine Weak stream Kidney stones   Review of Systems  Gastrointestinal (upper)  : Negative for upper GI symptoms  Gastrointestinal (lower) : Negative for lower GI symptoms  Constitutional : Negative for symptoms  Skin: Itching  Eyes: Negative for eye symptoms  Ear/Nose/Throat : Negative for Ear/Nose/Throat symptoms  Hematologic/Lymphatic: Negative for Hematologic/Lymphatic symptoms  Cardiovascular : Negative for cardiovascular symptoms  Respiratory : Negative for respiratory symptoms  Endocrine: Negative for endocrine symptoms  Musculoskeletal: Back pain  Neurological: Headaches Dizziness  Psychologic: Negative for psychiatric symptoms

## 2020-09-23 NOTE — Progress Notes (Signed)
History of Present Illness:   Here for followup of PCa.  10.14.2016: Ultrasound and biopsy of prostate.  PSA 5.9, prostate volume 41 mL, PSA density 0.14.  4/12 cores positive, all in the right apical and mid prostate, all with GG 1 pathology. IPSS 13, bother score 3.  Strong family history of prostate cancer.  Active surveillance chosen.  9.25.2018: Prostate MRI-2 cm PI-RADS 5 lesion in left transition zone. 11.16.2018: Fusion biopsy.  Volume 42 mL.  4 cores were taken from the region of interest.  3 of these revealed GG 1 pattern, 1 was benign.  2/12 systematic cores-from the left mid medial and left apical medial also revealed GG 1 pattern.  7.7.2020: PSA 6.1.  He was to have been scheduled for repeat prostate MRI with possible fusion biopsy in 6 months.  Neither of these was performed.  7.5.2022: He apparently had a PSA drawn within the past month with Dr. Woody Seller.  He states that it was 7.0.  He has had no bony pain.  He has no significant lower urinary tract symptoms that are overall stable.  He has had recent intermittent gross painless hematuria.  He really does not have any significant history of cigarette smoking.   Past Medical History:  Diagnosis Date   Arthritis    Bradycardia    asymptomatic    History of kidney stones    Hypertension    Prostate cancer [facility])     Past Surgical History:  Procedure Laterality Date   COLONOSCOPY     FLEXIBLE SIGMOIDOSCOPY N/A 04/02/2015   Procedure: FLEXIBLE SIGMOIDOSCOPY;  Surgeon: Rogene Houston, MD;  [facility];  Service: Endoscopy;  Laterality: N/A;  1:45 - moved to 1/11 @ 8:30 - Ann to notify   KIDNEY STONE SURGERY     TOTAL KNEE ARTHROPLASTY Left 05/02/2017   Procedure: LEFT TOTAL KNEE ARTHROPLASTY;  Surgeon: Gaynelle Arabian, MD;  Location: [facility];  Service: Orthopedics;  Laterality: Left;    Home Medications:  Allergies as of 09/23/2020       Reactions   Dilaudid [hydromorphone Hcl] Anaphylaxis        Medication  List        Accurate as of September 23, 2020  8:12 AM. If you have any questions, ask your nurse or doctor.          amLODipine 5 MG tablet Commonly known as: NORVASC Take 5 mg by mouth daily.   loperamide 2 MG capsule Commonly known as: IMODIUM Take 1 capsule (2 mg total) by mouth 2 (two) times daily.   methocarbamol 500 MG tablet Commonly known as: ROBAXIN Take 1 tablet (500 mg total) by mouth every 6 (six) hours as needed for muscle spasms.   oxyCODONE 5 MG immediate release tablet Commonly known as: Oxy IR/ROXICODONE Take 1-2 tablets (5-10 mg total) by mouth every 4 (four) hours as needed for moderate pain or severe pain.   rivaroxaban 10 MG Tabs tablet Commonly known as: XARELTO Take 1 tablet (10 mg total) by mouth daily with breakfast. Take Xarelto for two and a half more weeks following discharge from the hospital, then discontinue Xarelto. Once the patient has completed the Xarelto, they may resume the 81 mg Aspirin.        Allergies:  Allergies  Allergen Reactions   Dilaudid [Hydromorphone Hcl] Anaphylaxis    No family history on file.  Social History:  reports that he has quit smoking. His smoking use included cigarettes. He has never used  smokeless tobacco. He reports that he does not drink alcohol and does not use drugs.  ROS: A complete review of systems was performed.  All systems are negative except for pertinent findings as noted.  Physical Exam:  Vital signs in last 24 hours: There were no vitals taken for this visit. Constitutional:  Alert and oriented, No acute distress Cardiovascular: Regular rate  Respiratory: Normal respiratory effort GI: Abdomen is soft, nontender, nondistended, no abdominal masses. No CVAT.  There are no inguinal hernias.  Abdomen is obese. Genitourinary: Normal uncircumcised male phallus, testes are descended bilaterally and non-tender and without masses, scrotum is normal in appearance without lesions or masses, perineum is  normal on inspection.  Prostate 40 g, symmetrical, nonnodular, nontender. Lymphatic: No lymphadenopathy Neurologic: Grossly intact, no focal deficits Psychiatric: Normal mood and affect  I have reviewed prior pt notes  I have reviewed urinalysis results  I have reviewed prior PSA/pathology results    Impression/Assessment:  1.  Intermittent gross painless hematuria although urinalysis is clear now  2.  Prostate cancer, GG 1, initial diagnosis in 2016 with confirmatory biopsy in 2018.  No follow-up for the past 2 years.  PSA stable at 7, DRE stable  Plan:  1.  We will check BUN and creatinine today in advance of CT abdomen/pelvis, hematuria protocol  2.  I will bring him back after CT for cystoscopy.  I did discuss possible etiologies of gross hematuria  3.  He will also eventually need eventual prostate MRI with fusion biopsy.  We will evaluate his hematuria first

## 2020-09-24 LAB — BUN+CREAT
BUN/Creatinine Ratio: 16 (ref 10–24)
BUN: 20 mg/dL (ref 8–27)
Creatinine, Ser: 1.22 mg/dL (ref 0.76–1.27)
eGFR: 61 mL/min/{1.73_m2} (ref >59)

## 2020-10-03 ENCOUNTER — Ambulatory Visit (HOSPITAL_COMMUNITY)
Admission: RE | Admit: 2020-10-03 | Discharge: 2020-10-03 | Disposition: A | Discharge status: Home / Self Care | Payer: Medicare Other | Source: Ambulatory Visit | Attending: Urology | Admitting: Urology

## 2020-10-03 ENCOUNTER — Other Ambulatory Visit: Payer: Self-pay

## 2020-10-03 DIAGNOSIS — N3289 Other specified disorders of bladder: Secondary | ICD-10-CM | POA: Diagnosis not present

## 2020-10-03 DIAGNOSIS — N200 Calculus of kidney: Secondary | ICD-10-CM | POA: Diagnosis not present

## 2020-10-03 DIAGNOSIS — R319 Hematuria, unspecified: Secondary | ICD-10-CM | POA: Diagnosis not present

## 2020-10-03 DIAGNOSIS — R310 Gross hematuria: Secondary | ICD-10-CM | POA: Diagnosis not present

## 2020-10-03 DIAGNOSIS — N281 Cyst of kidney, acquired: Secondary | ICD-10-CM | POA: Diagnosis not present

## 2020-10-03 MED ORDER — IOHEXOL 300 MG/ML  SOLN
125.0000 mL | Freq: Once | INTRAMUSCULAR | Status: AC | PRN
  Administered 2020-10-03: 125 mL via INTRAVENOUS

## 2020-10-07 NOTE — Progress Notes (Signed)
Results mailed 

## 2020-10-16 DIAGNOSIS — I10 Essential (primary) hypertension: Secondary | ICD-10-CM | POA: Diagnosis not present

## 2020-10-16 DIAGNOSIS — H5712 Ocular pain, left eye: Secondary | ICD-10-CM | POA: Diagnosis not present

## 2020-10-16 DIAGNOSIS — Z299 Encounter for prophylactic measures, unspecified: Secondary | ICD-10-CM | POA: Diagnosis not present

## 2020-10-16 DIAGNOSIS — H00015 Hordeolum externum left lower eyelid: Secondary | ICD-10-CM | POA: Diagnosis not present

## 2020-10-19 DIAGNOSIS — I10 Essential (primary) hypertension: Secondary | ICD-10-CM | POA: Diagnosis not present

## 2020-10-19 DIAGNOSIS — E7800 Pure hypercholesterolemia, unspecified: Secondary | ICD-10-CM | POA: Diagnosis not present

## 2020-10-19 DIAGNOSIS — M109 Gout, unspecified: Secondary | ICD-10-CM | POA: Diagnosis not present

## 2020-10-19 DIAGNOSIS — M159 Polyosteoarthritis, unspecified: Secondary | ICD-10-CM | POA: Diagnosis not present

## 2020-10-21 ENCOUNTER — Ambulatory Visit: Payer: Medicare Other | Admitting: Urology

## 2020-11-03 NOTE — Progress Notes (Signed)
History of Present Illness: Here for followup of PCa.   10.14.2016: Ultrasound and biopsy of prostate.  PSA 5.9, prostate volume 41 mL, PSA density 0.14.  4/12 cores positive, all in the right apical and mid prostate, all with GG 1 pathology. IPSS 13, bother score 3.  Strong family history of prostate cancer.  Active surveillance chosen.  9.25.2018: Prostate MRI-2 cm PI-RADS 5 lesion in left transition zone. 11.16.2018: Fusion biopsy.  Volume 42 mL.  4 cores were taken from the region of interest.  3 of these revealed GG 1 pattern, 1 was benign.  2/12 systematic cores-from the left mid medial and left apical medial also revealed GG 1 pattern.   7.7.2020: PSA 6.1.  He was to have been scheduled for repeat prostate MRI with possible fusion biopsy in 6 months.  Neither of these was performed.   7.5.2022: He apparently had a PSA drawn within the past month with Dr. Woody Seller.  He states that it was 7.0.  He has had no bony pain.  He has no significant lower urinary tract symptoms that are overall stable.  He has had recent intermittent gross painless hematuria.  He really does not have any significant history of cigarette smoking.  8.16.2022: Here for cystoscopy.  He did have a CT scan on 7/18 revealing:  IMPRESSION: 1. Numerous bilateral nonobstructive renal calculi. 2. Urinary bladder wall thickening may partially be due to nondistention although a component of cystitis cannot be excluded. 3. Bilateral benign-appearing renal cysts. Some of the hypodense right renal lesions of the left kidney are technically too small to characterize. 4. Potential morphologic indicators of early cirrhosis in the liver. 5. Multilevel lumbar impingement. 6. 60 cubic mm left lower lobe subpleural nodule on image 6 series 15. No follow-up needed if patient is low-risk. Non-contrast chest CT can be considered in 12 months if patient is high-risk. This recommendation follows the consensus statement: Guidelines  for Management of Incidental Pulmonary Nodules Detected on CT Images: From the Fleischner Society 2017; Radiology 2017; 284:228-243. 7.  Aortic Atherosclerosis (ICD10-I70.0). 8. 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  He denies any hematuria.  Past Medical History:  Diagnosis Date   Arthritis    Bradycardia    asymptomatic    History of kidney stones    Hypertension    Prostate cancer [facility])     Past Surgical History:  Procedure Laterality Date   COLONOSCOPY     FLEXIBLE SIGMOIDOSCOPY N/A 04/02/2015   Procedure: FLEXIBLE SIGMOIDOSCOPY;  Surgeon: Rogene Houston, MD;  [facility];  Service: Endoscopy;  Laterality: N/A;  1:45 - moved to 1/11 @ 8:30 - Ann to notify   KIDNEY STONE SURGERY     TOTAL KNEE ARTHROPLASTY Left 05/02/2017   Procedure: LEFT TOTAL KNEE ARTHROPLASTY;  Surgeon: Gaynelle Arabian, MD;  Location: [facility];  Service: Orthopedics;  Laterality: Left;    Home Medications:  Allergies as of 11/04/2020       Reactions   Dilaudid [hydromorphone Hcl] Anaphylaxis   Dilaudid [hydromorphone]    Other reaction(s): Irregular heart rate        Medication List        Accurate as of November 03, 2020  8:15 PM. If you have any questions, ask your nurse or doctor.          allopurinol 100 MG tablet Commonly known as: ZYLOPRIM Take 100 mg by mouth daily.   amLODipine 5 MG tablet Commonly known as: NORVASC  Take 5 mg by mouth daily.   aspirin 81 MG chewable tablet Chew by mouth daily.   loperamide 2 MG capsule Commonly known as: IMODIUM Take 1 capsule (2 mg total) by mouth 2 (two) times daily.   meclizine 25 MG tablet Commonly known as: ANTIVERT Take 25 mg by mouth 3 (three) times daily as needed for dizziness.   methocarbamol 500 MG tablet Commonly known as: ROBAXIN Take 1 tablet (500 mg total) by mouth every 6 (six) hours as needed for muscle spasms.   metoprolol succinate 25 MG 24 hr  tablet Commonly known as: TOPROL-XL Take 25 mg by mouth 2 (two) times daily.   multivitamin tablet Take 1 tablet by mouth daily.   oxyCODONE 5 MG immediate release tablet Commonly known as: Oxy IR/ROXICODONE Take 1-2 tablets (5-10 mg total) by mouth every 4 (four) hours as needed for moderate pain or severe pain.   rivaroxaban 10 MG Tabs tablet Commonly known as: XARELTO Take 1 tablet (10 mg total) by mouth daily with breakfast. Take Xarelto for two and a half more weeks following discharge from the hospital, then discontinue Xarelto. Once the patient has completed the Xarelto, they may resume the 81 mg Aspirin.   rosuvastatin 5 MG tablet Commonly known as: CRESTOR Take 5 mg by mouth daily.   valsartan 160 MG tablet Commonly known as: DIOVAN Take 160 mg by mouth daily.        Allergies:  Allergies  Allergen Reactions   Dilaudid [Hydromorphone Hcl] Anaphylaxis   Dilaudid [Hydromorphone]     Other reaction(s): Irregular heart rate    No family history on file.  Social History:  reports that he has quit smoking. His smoking use included cigarettes. He has never used smokeless tobacco. He reports that he does not drink alcohol and does not use drugs.  ROS: A complete review of systems was performed.  All systems are negative except for pertinent findings as noted.  Physical Exam:  Vital signs in last 24 hours: There were no vitals taken for this visit. Constitutional:  Alert and oriented, No acute distress Cardiovascular: Regular rate  Respiratory: Normal respiratory effort GI: Abdomen is soft, nontender, nondistended, no abdominal masses. No CVAT.  Genitourinary: Normal male phallus, testes are descended bilaterally and non-tender and without masses, scrotum is normal in appearance without lesions or masses, perineum is normal on inspection. Lymphatic: No lymphadenopathy Neurologic: Grossly intact, no focal deficits Psychiatric: Normal mood and affect  I have  reviewed prior pt notes  I have reviewed notes from referring/previous physicians  I have reviewed urinalysis results  I have independently reviewed prior imaging-CT scan images  I have reviewed prior PSA results  Cystoscopy Procedure Note:  Indication: Gross hematuria  After informed consent and discussion of the procedure and its risks, Conlan Clanin Lansberry was positioned and prepped in the standard fashion.  Cystoscopy was performed with a flexible cystoscope.   Findings: Urethra: Mild proximal urethral stricture, easily passed with scope Prostate: Moderately obstructing with bilobar hypertrophy Bladder neck: Normal Ureteral orifices: Normal bilaterally Bladder: Tiny stone in posterior midline, perhaps 1 mm in size.  Mild trabeculations.  No urothelial abnormalities.  The patient tolerated the procedure well.  Cipro was given.        Impression/Assessment:  1.  Prostate cancer, grade group 1 on active surveillance.  2.  Bilateral renal calculi, asymptomatic but this may have caused the patient's hematuria  3.  BPH with fair residual based on cystoscopy today  Plan:  1.  At this point, even though he has large stones, these have not been symptoms radiographically.  2.  I will start him on tamsulosin as he does have moderate residual urine volume  3.  I will see back in about 3 months.  Check post void.  I think it worthwhile to repeat a CT in about a year  4.  We will eventually need to repeat MRI and fusion biopsy

## 2020-11-04 ENCOUNTER — Ambulatory Visit (INDEPENDENT_AMBULATORY_CARE_PROVIDER_SITE_OTHER): Payer: Medicare Other | Admitting: Urology

## 2020-11-04 ENCOUNTER — Other Ambulatory Visit: Payer: Self-pay

## 2020-11-04 VITALS — BP 150/79 | HR 66

## 2020-11-04 DIAGNOSIS — C61 Malignant neoplasm of prostate: Secondary | ICD-10-CM | POA: Diagnosis not present

## 2020-11-04 DIAGNOSIS — N200 Calculus of kidney: Secondary | ICD-10-CM

## 2020-11-04 DIAGNOSIS — N138 Other obstructive and reflux uropathy: Secondary | ICD-10-CM

## 2020-11-04 DIAGNOSIS — R339 Retention of urine, unspecified: Secondary | ICD-10-CM | POA: Diagnosis not present

## 2020-11-04 DIAGNOSIS — N401 Enlarged prostate with lower urinary tract symptoms: Secondary | ICD-10-CM | POA: Diagnosis not present

## 2020-11-04 DIAGNOSIS — R310 Gross hematuria: Secondary | ICD-10-CM | POA: Diagnosis not present

## 2020-11-04 LAB — URINALYSIS, ROUTINE W REFLEX MICROSCOPIC
Bilirubin, UA: NEGATIVE
Glucose, UA: NEGATIVE
Ketones, UA: NEGATIVE
Nitrite, UA: NEGATIVE
Protein,UA: NEGATIVE
RBC, UA: NEGATIVE
Specific Gravity, UA: 1.02 (ref 1.005–1.030)
Urobilinogen, Ur: 0.2 mg/dL (ref 0.2–1.0)
pH, UA: 5.5 (ref 5.0–7.5)

## 2020-11-04 LAB — MICROSCOPIC EXAMINATION
Bacteria, UA: NONE SEEN
RBC: NONE SEEN /hpf (ref 0–2)
Renal Epithel, UA: NONE SEEN /hpf

## 2020-11-04 MED ORDER — CIPROFLOXACIN HCL 500 MG PO TABS
500.0000 mg | ORAL_TABLET | Freq: Once | ORAL | Status: AC
  Administered 2020-11-04: 500 mg via ORAL

## 2020-11-04 MED ORDER — TAMSULOSIN HCL 0.4 MG PO CAPS: 0.4000 mg | ORAL_CAPSULE | Freq: Every day | ORAL | 11 refills | Status: DC

## 2020-11-06 DIAGNOSIS — I10 Essential (primary) hypertension: Secondary | ICD-10-CM | POA: Diagnosis not present

## 2020-11-06 DIAGNOSIS — Z299 Encounter for prophylactic measures, unspecified: Secondary | ICD-10-CM | POA: Diagnosis not present

## 2020-11-06 DIAGNOSIS — M545 Low back pain, unspecified: Secondary | ICD-10-CM | POA: Diagnosis not present

## 2020-11-06 DIAGNOSIS — R300 Dysuria: Secondary | ICD-10-CM | POA: Diagnosis not present

## 2020-11-06 DIAGNOSIS — G319 Degenerative disease of nervous system, unspecified: Secondary | ICD-10-CM | POA: Diagnosis not present

## 2020-11-19 DIAGNOSIS — E7800 Pure hypercholesterolemia, unspecified: Secondary | ICD-10-CM | POA: Diagnosis not present

## 2020-11-19 DIAGNOSIS — M159 Polyosteoarthritis, unspecified: Secondary | ICD-10-CM | POA: Diagnosis not present

## 2020-11-19 DIAGNOSIS — M109 Gout, unspecified: Secondary | ICD-10-CM | POA: Diagnosis not present

## 2020-11-19 DIAGNOSIS — I10 Essential (primary) hypertension: Secondary | ICD-10-CM | POA: Diagnosis not present

## 2020-12-08 DIAGNOSIS — I739 Peripheral vascular disease, unspecified: Secondary | ICD-10-CM | POA: Diagnosis not present

## 2020-12-08 DIAGNOSIS — I779 Disorder of arteries and arterioles, unspecified: Secondary | ICD-10-CM | POA: Diagnosis not present

## 2020-12-08 DIAGNOSIS — I700 Atherosclerosis of aorta: Secondary | ICD-10-CM | POA: Diagnosis not present

## 2020-12-08 DIAGNOSIS — Z299 Encounter for prophylactic measures, unspecified: Secondary | ICD-10-CM | POA: Diagnosis not present

## 2020-12-08 DIAGNOSIS — I10 Essential (primary) hypertension: Secondary | ICD-10-CM | POA: Diagnosis not present

## 2020-12-22 DIAGNOSIS — I779 Disorder of arteries and arterioles, unspecified: Secondary | ICD-10-CM | POA: Diagnosis not present

## 2020-12-22 DIAGNOSIS — I70213 Atherosclerosis of native arteries of extremities with intermittent claudication, bilateral legs: Secondary | ICD-10-CM | POA: Diagnosis not present

## 2021-01-07 DIAGNOSIS — Z299 Encounter for prophylactic measures, unspecified: Secondary | ICD-10-CM | POA: Diagnosis not present

## 2021-01-07 DIAGNOSIS — Z23 Encounter for immunization: Secondary | ICD-10-CM | POA: Diagnosis not present

## 2021-01-07 DIAGNOSIS — R2231 Localized swelling, mass and lump, right upper limb: Secondary | ICD-10-CM | POA: Diagnosis not present

## 2021-01-07 DIAGNOSIS — M25511 Pain in right shoulder: Secondary | ICD-10-CM | POA: Diagnosis not present

## 2021-01-07 DIAGNOSIS — I10 Essential (primary) hypertension: Secondary | ICD-10-CM | POA: Diagnosis not present

## 2021-01-23 DIAGNOSIS — Z299 Encounter for prophylactic measures, unspecified: Secondary | ICD-10-CM | POA: Diagnosis not present

## 2021-01-23 DIAGNOSIS — K5900 Constipation, unspecified: Secondary | ICD-10-CM | POA: Diagnosis not present

## 2021-01-23 DIAGNOSIS — R21 Rash and other nonspecific skin eruption: Secondary | ICD-10-CM | POA: Diagnosis not present

## 2021-01-23 DIAGNOSIS — M7072 Other bursitis of hip, left hip: Secondary | ICD-10-CM | POA: Diagnosis not present

## 2021-01-23 DIAGNOSIS — R252 Cramp and spasm: Secondary | ICD-10-CM | POA: Diagnosis not present

## 2021-01-23 DIAGNOSIS — I10 Essential (primary) hypertension: Secondary | ICD-10-CM | POA: Diagnosis not present

## 2021-02-03 ENCOUNTER — Encounter: Payer: Self-pay | Admitting: Urology

## 2021-02-03 ENCOUNTER — Other Ambulatory Visit: Payer: Self-pay

## 2021-02-03 ENCOUNTER — Ambulatory Visit: Payer: Medicare Other | Admitting: Urology

## 2021-02-03 VITALS — BP 151/70 | HR 61 | Temp 99.1°F

## 2021-02-03 DIAGNOSIS — N401 Enlarged prostate with lower urinary tract symptoms: Secondary | ICD-10-CM | POA: Diagnosis not present

## 2021-02-03 DIAGNOSIS — R339 Retention of urine, unspecified: Secondary | ICD-10-CM | POA: Diagnosis not present

## 2021-02-03 DIAGNOSIS — C61 Malignant neoplasm of prostate: Secondary | ICD-10-CM

## 2021-02-03 DIAGNOSIS — N138 Other obstructive and reflux uropathy: Secondary | ICD-10-CM | POA: Diagnosis not present

## 2021-02-03 DIAGNOSIS — N200 Calculus of kidney: Secondary | ICD-10-CM | POA: Diagnosis not present

## 2021-02-03 NOTE — Progress Notes (Signed)
H&P   History of Present Illness:   10.14.2016: Ultrasound and biopsy of prostate.  PSA 5.9, prostate volume 41 mL, PSA density 0.14.  4/12 cores positive, all in the right apical and mid prostate, all with GG 1 pathology. IPSS 13, bother score 3.  Strong family history of prostate cancer.  Active surveillance chosen.   9.25.2018: Prostate MRI-2 cm PI-RADS 5 lesion in left transition zone. 11.16.2018: Fusion biopsy.  Volume 42 mL.  4 cores were taken from the region of interest.  3 of these revealed GG 1 pattern, 1 was benign.  2/12 systematic cores-from the left mid medial and left apical medial also revealed GG 1 pattern.   7.7.2020: PSA 6.1.  He was to have been scheduled for repeat prostate MRI with possible fusion biopsy in 6 months.  Neither of these was performed.   7.5.2022: He apparently had a PSA drawn within the past month with Dr. Woody Seller.  He states that it was 7.0.  He has had no bony pain.  He has no significant lower urinary tract symptoms that are overall stable.  He has had recent intermittent gross painless hematuria.  He really does not have any significant history of cigarette smoking.   8.16.2022:  He did have a CT scan on 7/18 revealing:  IMPRESSION: 1. Numerous bilateral nonobstructive renal calculi. 2. Urinary bladder wall thickening may partially be due to nondistention although a component of cystitis cannot be excluded. 3. Bilateral benign-appearing renal cysts. Some of the hypodense right renal lesions of the left kidney are technically too small to characterize. 4. Potential morphologic indicators of early cirrhosis in the liver. 5. Multilevel lumbar impingement. 6. 60 cubic mm left lower lobe subpleural nodule on image 6 series 15. No follow-up needed if patient is low-risk. Non-contrast chest CT can be considered in 12 months if patient is high-risk. This recommendation follows the consensus statement: Guidelines for Management of Incidental Pulmonary  Nodules Detected on CT Images: From the Fleischner Society 2017; Radiology 2017; 284:228-243. 7.  Aortic Atherosclerosis (ICD10-I70.0). 8. 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 Cystoscopy revealed bilobar hyperplasia with obstruction.  Normal bladder.  11.15.2022: Since his last visit he has been doing well.  No significant lower urinary tract symptoms.  IPSS 3, quality-of-life score 1.  He has not had a recent PSA here, but he says that Dr. Woody Seller checked 1 within the last month or 2.  Past Medical History:  Diagnosis Date   Arthritis    Bradycardia    asymptomatic    History of kidney stones    Hypertension    Prostate cancer [facility])     Past Surgical History:  Procedure Laterality Date   COLONOSCOPY     FLEXIBLE SIGMOIDOSCOPY N/A 04/02/2015   Procedure: FLEXIBLE SIGMOIDOSCOPY;  Surgeon: Rogene Houston, MD;  [facility];  Service: Endoscopy;  Laterality: N/A;  1:45 - moved to 1/11 @ 8:30 - Ann to notify   KIDNEY STONE SURGERY     TOTAL KNEE ARTHROPLASTY Left 05/02/2017   Procedure: LEFT TOTAL KNEE ARTHROPLASTY;  Surgeon: Gaynelle Arabian, MD;  Location: [facility];  Service: Orthopedics;  Laterality: Left;    Home Medications:  Allergies as of 02/03/2021       Reactions   Dilaudid [hydromorphone Hcl] Anaphylaxis   Dilaudid [hydromorphone]    Other reaction(s): Irregular heart rate        Medication List        Accurate as of February 03, 2021 11:16 AM. If you have any questions, ask your nurse or doctor.          allopurinol 100 MG tablet Commonly known as: ZYLOPRIM Take 100 mg by mouth daily.   amLODipine 5 MG tablet Commonly known as: NORVASC Take 5 mg by mouth daily.   aspirin 81 MG chewable tablet Chew by mouth daily.   loperamide 2 MG capsule Commonly known as: IMODIUM Take 1 capsule (2 mg total) by mouth 2 (two) times daily.   meclizine 25 MG tablet Commonly known as: ANTIVERT Take 25 mg by  mouth 3 (three) times daily as needed for dizziness.   methocarbamol 500 MG tablet Commonly known as: ROBAXIN Take 1 tablet (500 mg total) by mouth every 6 (six) hours as needed for muscle spasms.   metoprolol succinate 25 MG 24 hr tablet Commonly known as: TOPROL-XL Take 25 mg by mouth 2 (two) times daily.   multivitamin tablet Take 1 tablet by mouth daily.   oxyCODONE 5 MG immediate release tablet Commonly known as: Oxy IR/ROXICODONE Take 1-2 tablets (5-10 mg total) by mouth every 4 (four) hours as needed for moderate pain or severe pain.   rivaroxaban 10 MG Tabs tablet Commonly known as: XARELTO Take 1 tablet (10 mg total) by mouth daily with breakfast. Take Xarelto for two and a half more weeks following discharge from the hospital, then discontinue Xarelto. Once the patient has completed the Xarelto, they may resume the 81 mg Aspirin.   rosuvastatin 5 MG tablet Commonly known as: CRESTOR Take 5 mg by mouth daily.   tamsulosin 0.4 MG Caps capsule Commonly known as: FLOMAX Take 1 capsule (0.4 mg total) by mouth daily after supper.   valsartan 160 MG tablet Commonly known as: DIOVAN Take 160 mg by mouth daily.        Allergies:  Allergies  Allergen Reactions   Dilaudid [Hydromorphone Hcl] Anaphylaxis   Dilaudid [Hydromorphone]     Other reaction(s): Irregular heart rate    No family history on file.  Social History:  reports that he has quit smoking. His smoking use included cigarettes. He has never used smokeless tobacco. He reports that he does not drink alcohol and does not use drugs.  ROS: A complete review of systems was performed.  All systems are negative except for pertinent findings as noted.  Physical Exam:  Vital signs in last 24 hours: BP (!) 151/70   Pulse 61   Temp 99.1 F (37.3 C)  Constitutional:  Alert and oriented, No acute distress Cardiovascular: Regular rate  Respiratory: Normal respiratory effort GI: Abdomen is soft, nontender,  nondistended, no abdominal masses. No CVAT.  No hernias. Genitourinary: Normal uncircumcised male phallus, testes are descended bilaterally and non-tender and without masses, scrotum is normal in appearance without lesions or masses, perineum is normal on inspection.  Prostate 60 g, symmetric, nonnodular, nontender Lymphatic: No lymphadenopathy Neurologic: Grossly intact, no focal deficits Psychiatric: Normal mood and affect  I have reviewed prior pt notes  I have reviewed notes from referring/previous physicians  I have reviewed urinalysis results  I have independently reviewed prior imaging    Impression/Assessment:  1.  Prostate cancer, low risk, last biopsy was approximately 5 years ago.  He needs repeat MRI and fusion biopsy if MRI positive.  I do not have recent PSA results  2.  BPH, symptoms stable, DRE normal  3.  Renal calculi, bilateral, asymptomatic  Plan:  1.  We will search down his most recent  PSA.  I would prefer to have them done in our office as I would be able to discuss with him at point of care his PSA result and plans.  I cannot do that at this time as we will not have his PSA for a week or so  2.  We will look towards performing MRI and possible fusion biopsy early in the 2023-year

## 2021-02-03 NOTE — Progress Notes (Signed)
Urological Symptom Review  Patient is experiencing the following symptoms: Get up at night to urinate Weak stream Erection problems (male only)   Review of Systems  Gastrointestinal (upper)  : Negative for upper GI symptoms  Gastrointestinal (lower) : Negative for lower GI symptoms  Constitutional : Negative for symptoms  Skin: Negative for skin symptoms  Eyes: Negative for eye symptoms  Ear/Nose/Throat : Negative for Ear/Nose/Throat symptoms  Hematologic/Lymphatic: Negative for Hematologic/Lymphatic symptoms  Cardiovascular : Negative for cardiovascular symptoms  Respiratory : Negative for respiratory symptoms  Endocrine: Negative for endocrine symptoms  Musculoskeletal: Back pain Joint pain  Neurological: Negative for neurological symptoms  Psychologic: Negative for psychiatric symptoms

## 2021-02-05 ENCOUNTER — Other Ambulatory Visit: Payer: Self-pay

## 2021-02-09 ENCOUNTER — Other Ambulatory Visit: Payer: Self-pay

## 2021-02-18 DIAGNOSIS — I10 Essential (primary) hypertension: Secondary | ICD-10-CM | POA: Diagnosis not present

## 2021-02-18 DIAGNOSIS — M159 Polyosteoarthritis, unspecified: Secondary | ICD-10-CM | POA: Diagnosis not present

## 2021-02-18 DIAGNOSIS — M109 Gout, unspecified: Secondary | ICD-10-CM | POA: Diagnosis not present

## 2021-02-18 DIAGNOSIS — E7800 Pure hypercholesterolemia, unspecified: Secondary | ICD-10-CM | POA: Diagnosis not present

## 2021-04-21 DIAGNOSIS — I10 Essential (primary) hypertension: Secondary | ICD-10-CM | POA: Diagnosis not present

## 2021-04-21 DIAGNOSIS — E782 Mixed hyperlipidemia: Secondary | ICD-10-CM | POA: Diagnosis not present

## 2021-05-07 DIAGNOSIS — Z299 Encounter for prophylactic measures, unspecified: Secondary | ICD-10-CM | POA: Diagnosis not present

## 2021-05-07 DIAGNOSIS — D692 Other nonthrombocytopenic purpura: Secondary | ICD-10-CM | POA: Diagnosis not present

## 2021-05-07 DIAGNOSIS — I779 Disorder of arteries and arterioles, unspecified: Secondary | ICD-10-CM | POA: Diagnosis not present

## 2021-05-07 DIAGNOSIS — D492 Neoplasm of unspecified behavior of bone, soft tissue, and skin: Secondary | ICD-10-CM | POA: Diagnosis not present

## 2021-05-07 DIAGNOSIS — I10 Essential (primary) hypertension: Secondary | ICD-10-CM | POA: Diagnosis not present

## 2021-05-07 DIAGNOSIS — Z789 Other specified health status: Secondary | ICD-10-CM | POA: Diagnosis not present

## 2021-05-12 DIAGNOSIS — C44319 Basal cell carcinoma of skin of other parts of face: Secondary | ICD-10-CM | POA: Diagnosis not present

## 2021-05-12 DIAGNOSIS — D485 Neoplasm of uncertain behavior of skin: Secondary | ICD-10-CM | POA: Diagnosis not present

## 2021-05-12 DIAGNOSIS — D1801 Hemangioma of skin and subcutaneous tissue: Secondary | ICD-10-CM | POA: Diagnosis not present

## 2021-05-23 DIAGNOSIS — L239 Allergic contact dermatitis, unspecified cause: Secondary | ICD-10-CM | POA: Diagnosis not present

## 2021-05-24 ENCOUNTER — Emergency Department (HOSPITAL_COMMUNITY)
Admission: EM | Admit: 2021-05-24 | Discharge: 2021-05-24 | Disposition: A | Discharge status: Home / Self Care | Payer: Medicare Other | Attending: Emergency Medicine | Admitting: Emergency Medicine

## 2021-05-24 ENCOUNTER — Other Ambulatory Visit: Payer: Self-pay

## 2021-05-24 ENCOUNTER — Encounter (HOSPITAL_COMMUNITY): Payer: Self-pay

## 2021-05-24 DIAGNOSIS — Z7901 Long term (current) use of anticoagulants: Secondary | ICD-10-CM | POA: Insufficient documentation

## 2021-05-24 DIAGNOSIS — B029 Zoster without complications: Secondary | ICD-10-CM | POA: Diagnosis not present

## 2021-05-24 DIAGNOSIS — Z7982 Long term (current) use of aspirin: Secondary | ICD-10-CM | POA: Diagnosis not present

## 2021-05-24 DIAGNOSIS — R21 Rash and other nonspecific skin eruption: Secondary | ICD-10-CM | POA: Diagnosis present

## 2021-05-24 MED ORDER — TRIAMCINOLONE ACETONIDE 0.1 % EX CREA: 1.0000 "application " | TOPICAL_CREAM | Freq: Two times a day (BID) | CUTANEOUS | 0 refills | Status: DC

## 2021-05-24 MED ORDER — VALACYCLOVIR HCL 1 G PO TABS: 1000.0000 mg | ORAL_TABLET | Freq: Three times a day (TID) | ORAL | 0 refills | Status: DC

## 2021-05-24 NOTE — ED Triage Notes (Signed)
Pt with rash on back around to front of abdomen, started 2 days ago. Pt went to Urgent Care yesterday and was given Clobetasol cream without improvement.  ?

## 2021-05-24 NOTE — Discharge Instructions (Signed)
Please take Valtrex 1 tablet by mouth 3 times daily, this will help to improve the symptoms a little bit quicker but you will likely have the rash for a couple of weeks and it will have a burning and tingling and itching sensation.  You may find some relief with the topical steroid which I prescribed. ? ?You are contagious to others, please keep this area covered and stay away from others for the next couple of weeks until it is completely crusted over. ? ?You may see your doctor if symptoms are severe or worsening or return to the emergency department as needed. ?

## 2021-05-24 NOTE — ED Provider Notes (Signed)
?[place] ?Provider Note ? ? ?CSN: [id number] ?Arrival date & time: 05/24/21  0737 ? ?  ? ?History ? ?Chief Complaint  ?Patient presents with  ? Rash  ? ? ?Marcus Simpson is a 76 y.o. male. ? ? ?Rash ? ?This patient is a 76 year old male, he has a history of prior shingles in the past and presents today with a complaint of some rash with a burning tingling and itching sensation in his left mid to upper thorax.  This rash seems to wrap around underneath his left arm and towards his chest wall.  It started within the last couple of days, he went to the urgent care yesterday and was given a steroid cream.  He denies fevers or any recent illnesses. ? ?Home Medications ?Prior to Admission medications   ?Medication Sig Start Date End Date Taking? Authorizing Provider  ?triamcinolone cream (KENALOG) 0.1 % Apply 1 application topically 2 (two) times daily. 05/24/21  Yes Noemi Chapel, MD  ?valACYclovir (VALTREX) 1000 MG tablet Take 1 tablet (1,000 mg total) by mouth 3 (three) times daily. 05/24/21  Yes Noemi Chapel, MD  ?allopurinol (ZYLOPRIM) 100 MG tablet Take 100 mg by mouth daily.    [provider]  ?amLODipine (NORVASC) 5 MG tablet Take 5 mg by mouth daily.    [provider]  ?aspirin 81 MG chewable tablet Chew by mouth daily.    [provider]  ?loperamide (IMODIUM) 2 MG capsule Take 1 capsule (2 mg total) by mouth 2 (two) times daily. 04/02/15   Rogene Houston, MD  ?meclizine (ANTIVERT) 25 MG tablet Take 25 mg by mouth 3 (three) times daily as needed for dizziness.    [provider]  ?methocarbamol (ROBAXIN) 500 MG tablet Take 1 tablet (500 mg total) by mouth every 6 (six) hours as needed for muscle spasms. 05/03/17   Perkins, Alexzandrew L, PA-C  ?metoprolol succinate (TOPROL-XL) 25 MG 24 hr tablet Take 25 mg by mouth 2 (two) times daily. 09/01/20   [provider]  ?Multiple Vitamin (MULTIVITAMIN) tablet Take 1 tablet by mouth daily.    [provider]  ?oxyCODONE (OXY IR/ROXICODONE) 5 MG immediate release tablet Take 1-2 tablets (5-10 mg total) by mouth every 4 (four) hours as needed for moderate pain or severe pain. 05/03/17   Perkins, Alexzandrew L, PA-C  ?rivaroxaban (XARELTO) 10 MG TABS tablet Take 1 tablet (10 mg total) by mouth daily with breakfast. Take Xarelto for two and a half more weeks following discharge from the hospital, then discontinue Xarelto. ?Once the patient has completed the Xarelto, they may resume the 81 mg Aspirin. 05/04/17   Perkins, Alexzandrew L, PA-C  ?rosuvastatin (CRESTOR) 5 MG tablet Take 5 mg by mouth daily.    [provider]  ?tamsulosin (FLOMAX) 0.4 MG CAPS capsule Take 1 capsule (0.4 mg total) by mouth daily after supper. 11/04/20   Franchot Gallo, MD  ?valsartan (DIOVAN) 160 MG tablet Take 160 mg by mouth daily.    [provider]  ?   ? ?Allergies    ?Dilaudid [hydromorphone hcl] and Dilaudid [hydromorphone]   ? ?Review of Systems   ?Review of Systems  ?Skin:  Positive for rash.  ? ?Physical Exam ?Updated Vital Signs ?BP (!) 182/94 (BP Location: Right Arm)   Pulse (!) 54   Temp 98.1 ?F (36.7 ?C) (Oral)   Resp 16   Ht 1.803 m ('5\' 11"'$ )   Wt 104.3 kg   SpO2 98%  BMI 32.08 kg/m?  ?Physical Exam ?Vitals and nursing note reviewed.  ?Constitutional:   ?   Appearance: He is well-developed. He is not diaphoretic.  ?HENT:  ?   Head: Normocephalic and atraumatic.  ?Eyes:  ?   General:     ?   Right eye: No discharge.     ?   Left eye: No discharge.  ?   Conjunctiva/sclera: Conjunctivae normal.  ?Pulmonary:  ?   Effort: Pulmonary effort is normal. No respiratory distress.  ?Skin: ?   General: Skin is warm and dry.  ?   Findings: Rash present. No erythema.  ?   Comments: There is an erythematous vesicular rash starting in the midline of the posterior thorax around T4 and wrapping around over the left scapular area and into the left axilla.  ?Neurological:  ?   Mental Status: He is alert.  ?    Coordination: Coordination normal.  ? ? ?ED Results / Procedures / Treatments   ?Labs ?(all labs ordered are listed, but only abnormal results are displayed) ?Labs Reviewed - No data to display ? ?EKG ?None ? ?Radiology ?No results found. ? ?Procedures ?Procedures  ? ? ?Medications Ordered in ED ?Medications - No data to display ? ?ED Course/ Medical Decision Making/ A&P ?  ?                        ?Medical Decision Making ?Risk ?Prescription drug management. ? ? ?Herpes zoster, seems uncomplicated, will treat with Valtrex, topical steroids as needed for itching, follow-up with Dr., Precautions given to the patient including contagious precautions, both he and his spouse are in agreement with the plan ? ? ? ? ? ? ? ?Final Clinical Impression(s) / ED Diagnoses ?Final diagnoses:  ?Herpes zoster without complication  ? ? ?Rx / DC Orders ?ED Discharge Orders   ? ?      Ordered  ?  valACYclovir (VALTREX) 1000 MG tablet  3 times daily       ? 05/24/21 0758  ?  triamcinolone cream (KENALOG) 0.1 %  2 times daily       ? 05/24/21 0758  ? ?  ?  ? ?  ? ? ?  ?Noemi Chapel, MD ?05/24/21 0800 ? ?

## 2021-05-25 DIAGNOSIS — Z789 Other specified health status: Secondary | ICD-10-CM | POA: Diagnosis not present

## 2021-05-25 DIAGNOSIS — B029 Zoster without complications: Secondary | ICD-10-CM | POA: Diagnosis not present

## 2021-05-25 DIAGNOSIS — I10 Essential (primary) hypertension: Secondary | ICD-10-CM | POA: Diagnosis not present

## 2021-05-25 DIAGNOSIS — Z299 Encounter for prophylactic measures, unspecified: Secondary | ICD-10-CM | POA: Diagnosis not present

## 2021-05-25 DIAGNOSIS — I700 Atherosclerosis of aorta: Secondary | ICD-10-CM | POA: Diagnosis not present

## 2021-06-17 DIAGNOSIS — C44319 Basal cell carcinoma of skin of other parts of face: Secondary | ICD-10-CM | POA: Diagnosis not present

## 2021-06-25 DIAGNOSIS — Z789 Other specified health status: Secondary | ICD-10-CM | POA: Diagnosis not present

## 2021-06-25 DIAGNOSIS — B0229 Other postherpetic nervous system involvement: Secondary | ICD-10-CM | POA: Diagnosis not present

## 2021-06-25 DIAGNOSIS — I10 Essential (primary) hypertension: Secondary | ICD-10-CM | POA: Diagnosis not present

## 2021-06-25 DIAGNOSIS — Z299 Encounter for prophylactic measures, unspecified: Secondary | ICD-10-CM | POA: Diagnosis not present

## 2021-06-28 ENCOUNTER — Other Ambulatory Visit: Payer: Self-pay

## 2021-06-28 ENCOUNTER — Encounter (HOSPITAL_COMMUNITY): Payer: Self-pay

## 2021-06-28 ENCOUNTER — Emergency Department (HOSPITAL_COMMUNITY): Payer: Medicare Other

## 2021-06-28 ENCOUNTER — Emergency Department (HOSPITAL_COMMUNITY)
Admission: EM | Admit: 2021-06-28 | Discharge: 2021-06-28 | Disposition: A | Discharge status: Home / Self Care | Payer: Medicare Other | Attending: Emergency Medicine | Admitting: Emergency Medicine

## 2021-06-28 DIAGNOSIS — I10 Essential (primary) hypertension: Secondary | ICD-10-CM | POA: Insufficient documentation

## 2021-06-28 DIAGNOSIS — M436 Torticollis: Secondary | ICD-10-CM | POA: Insufficient documentation

## 2021-06-28 DIAGNOSIS — M542 Cervicalgia: Secondary | ICD-10-CM | POA: Diagnosis not present

## 2021-06-28 DIAGNOSIS — R519 Headache, unspecified: Secondary | ICD-10-CM | POA: Diagnosis not present

## 2021-06-28 DIAGNOSIS — Z7982 Long term (current) use of aspirin: Secondary | ICD-10-CM | POA: Insufficient documentation

## 2021-06-28 DIAGNOSIS — Z79899 Other long term (current) drug therapy: Secondary | ICD-10-CM | POA: Insufficient documentation

## 2021-06-28 DIAGNOSIS — Z7901 Long term (current) use of anticoagulants: Secondary | ICD-10-CM | POA: Insufficient documentation

## 2021-06-28 DIAGNOSIS — X501XXA Overexertion from prolonged static or awkward postures, initial encounter: Secondary | ICD-10-CM | POA: Insufficient documentation

## 2021-06-28 MED ORDER — OXYCODONE-ACETAMINOPHEN 5-325 MG PO TABS: 1.0000 | ORAL_TABLET | Freq: Four times a day (QID) | ORAL | 0 refills | Status: DC | PRN

## 2021-06-28 MED ORDER — LORAZEPAM 2 MG/ML IJ SOLN
0.5000 mg | Freq: Once | INTRAMUSCULAR | Status: AC
  Administered 2021-06-28: 0.5 mg via INTRAVENOUS
  Filled 2021-06-28: qty 1

## 2021-06-28 MED ORDER — KETOROLAC TROMETHAMINE 30 MG/ML IJ SOLN
15.0000 mg | Freq: Once | INTRAMUSCULAR | Status: AC
  Administered 2021-06-28: 15 mg via INTRAVENOUS
  Filled 2021-06-28: qty 1

## 2021-06-28 MED ORDER — MORPHINE SULFATE (PF) 4 MG/ML IV SOLN
4.0000 mg | Freq: Once | INTRAVENOUS | Status: AC
  Administered 2021-06-28: 4 mg via INTRAVENOUS
  Filled 2021-06-28: qty 1

## 2021-06-28 MED ORDER — CYCLOBENZAPRINE HCL 10 MG PO TABS: 10.0000 mg | ORAL_TABLET | Freq: Three times a day (TID) | ORAL | 0 refills | Status: DC | PRN

## 2021-06-28 NOTE — ED Provider Notes (Addendum)
?[place] ?Provider Note ? ? ?CSN: [id number] ?Arrival date & time: 06/28/21  1350 ? ?  ? ?History ? ?Chief Complaint  ?Patient presents with  ? Torticollis  ? ? ?Marcus Simpson is a 76 y.o. male. ? ?Patient has a history of hypertension.  He states that his neck started hurting him last couple days.  It is the left side of the neck and he cannot move ? ?The history is provided by the patient and medical records. No language interpreter was used.  ?Neck Injury ?This is a new problem. The current episode started 2 days ago. The problem occurs constantly. The problem has not changed since onset.Pertinent negatives include no chest pain, no abdominal pain and no headaches. The symptoms are aggravated by twisting. Nothing relieves the symptoms. He has tried nothing for the symptoms. The treatment provided no relief.  ? ?  ? ?Home Medications ?Prior to Admission medications   ?Medication Sig Start Date End Date Taking? Authorizing Provider  ?allopurinol (ZYLOPRIM) 100 MG tablet Take 100 mg by mouth daily.   Yes [provider]  ?aspirin 81 MG chewable tablet Chew by mouth daily.   Yes [provider]  ?cetirizine (ZYRTEC) 10 MG tablet Take 10 mg by mouth at bedtime.   Yes [provider]  ?cyclobenzaprine (FLEXERIL) 10 MG tablet Take 1 tablet (10 mg total) by mouth 3 (three) times daily as needed for muscle spasms. 06/28/21  Yes Milton Ferguson, MD  ?gabapentin (NEURONTIN) 400 MG capsule Take 400 mg by mouth 2 (two) times daily. 06/25/21  Yes [provider]  ?Gluc-Chonn-MSM-Boswellia-Vit D (GLUCOSAMINE CHOND TRIPLE/VIT D PO) Take 2 tablets by mouth daily.   Yes [provider]  ?indomethacin (INDOCIN) 25 MG capsule Take 25 mg by mouth 3 (three) times daily. 05/20/21  Yes [provider]  ?loperamide (IMODIUM) 2 MG capsule Take 1 capsule (2 mg total) by mouth 2 (two) times daily. 04/02/15  Yes Rehman, Mechele Dawley, MD  ?meclizine (ANTIVERT) 25 MG tablet  Take 25 mg by mouth 3 (three) times daily as needed for dizziness. Take 1/2 to 1 tablet 3 times a day as needed for Dizziness   Yes [provider]  ?Melatonin 5 MG CAPS Take by mouth.   Yes [provider]  ?metoprolol succinate (TOPROL-XL) 25 MG 24 hr tablet Take 25 mg by mouth 2 (two) times daily. 09/01/20  Yes [provider]  ?Multiple Vitamin (MULTIVITAMIN) tablet Take 1 tablet by mouth daily.   Yes [provider]  ?oxyCODONE-acetaminophen (PERCOCET) 5-325 MG tablet Take 1 tablet by mouth every 6 (six) hours as needed. 06/28/21  Yes Milton Ferguson, MD  ?rosuvastatin (CRESTOR) 5 MG tablet Take 5 mg by mouth daily.   Yes [provider]  ?clobetasol cream (TEMOVATE) 6.78 % Apply 1 application. topically 2 (two) times daily. 05/23/21   [provider]  ?methocarbamol (ROBAXIN) 500 MG tablet Take 1 tablet (500 mg total) by mouth every 6 (six) hours as needed for muscle spasms. ?Patient not taking: Reported on 06/28/2021 05/03/17   Dara Lords, Alexzandrew L, PA-C  ?oxyCODONE (OXY IR/ROXICODONE) 5 MG immediate release tablet Take 1-2 tablets (5-10 mg total) by mouth every 4 (four) hours as needed for moderate pain or severe pain. ?Patient not taking: Reported on 06/28/2021 05/03/17   Dara Lords, Alexzandrew L, PA-C  ?rivaroxaban (XARELTO) 10 MG TABS tablet Take 1 tablet (10 mg total) by mouth daily with breakfast. Take Xarelto for two and a half more  weeks following discharge from the hospital, then discontinue Xarelto. ?Once the patient has completed the Xarelto, they may resume the 81 mg Aspirin. ?Patient not taking: Reported on 06/28/2021 05/04/17   Dara Lords, Alexzandrew L, PA-C  ?tamsulosin (FLOMAX) 0.4 MG CAPS capsule Take 1 capsule (0.4 mg total) by mouth daily after supper. ?Patient not taking: Reported on 06/28/2021 11/04/20   Franchot Gallo, MD  ?triamcinolone cream (KENALOG) 0.1 % Apply 1 application topically 2 (two) times daily. 05/24/21   Noemi Chapel, MD  ?valACYclovir  (VALTREX) 1000 MG tablet Take 1 tablet (1,000 mg total) by mouth 3 (three) times daily. ?Patient not taking: Reported on 06/28/2021 05/24/21   Noemi Chapel, MD  ?   ? ?Allergies    ?Dilaudid [hydromorphone hcl] and Dilaudid [hydromorphone]   ? ?Review of Systems   ?Review of Systems  ?Constitutional:  Negative for appetite change and fatigue.  ?HENT:  Negative for congestion, ear discharge and sinus pressure.   ?Eyes:  Negative for discharge.  ?Respiratory:  Negative for cough.   ?Cardiovascular:  Negative for chest pain.  ?Gastrointestinal:  Negative for abdominal pain and diarrhea.  ?Genitourinary:  Negative for frequency and hematuria.  ?Musculoskeletal:  Negative for back pain.  ?     Neck pain  ?Skin:  Negative for rash.  ?Neurological:  Negative for seizures and headaches.  ?Psychiatric/Behavioral:  Negative for hallucinations.   ? ?Physical Exam ?Updated Vital Signs ?BP (!) 151/69   Pulse 65   Temp 98.6 ?F (37 ?C) (Oral)   Resp 16   Ht '5\' 11"'$  (1.803 m)   Wt 108 kg   SpO2 93%   BMI 33.19 kg/m?  ?Physical Exam ?Vitals and nursing note reviewed.  ?Constitutional:   ?   Appearance: He is well-developed.  ?HENT:  ?   Head: Normocephalic.  ?   Nose: Nose normal.  ?Eyes:  ?   General: No scleral icterus. ?   Conjunctiva/sclera: Conjunctivae normal.  ?Neck:  ?   Thyroid: No thyromegaly.  ?   Comments: Tender left lateral neck.  Patient cannot move neck without discomfort ?Cardiovascular:  ?   Rate and Rhythm: Normal rate and regular rhythm.  ?   Heart sounds: No murmur heard. ?  No friction rub. No gallop.  ?Pulmonary:  ?   Breath sounds: No stridor. No wheezing or rales.  ?Chest:  ?   Chest wall: No tenderness.  ?Abdominal:  ?   General: There is no distension.  ?   Tenderness: There is no abdominal tenderness. There is no rebound.  ?Musculoskeletal:     ?   General: Normal range of motion.  ?   Left lower leg: Left lower leg edema: .mil.  ?Lymphadenopathy:  ?   Cervical: No cervical adenopathy.  ?Skin: ?    Findings: No erythema or rash.  ?Neurological:  ?   Mental Status: He is alert and oriented to person, place, and time.  ?   Motor: No abnormal muscle tone.  ?   Coordination: Coordination normal.  ?Psychiatric:     ?   Behavior: Behavior normal.  ? ? ?ED Results / Procedures / Treatments   ?Labs ?(all labs ordered are listed, but only abnormal results are displayed) ?Labs Reviewed - No data to display ? ?EKG ?None ? ?Radiology ?CT Head Wo Contrast ? ?Result Date: 06/28/2021 ?CLINICAL DATA:  Headache. Pt unable to turn his head. Pt has his head tilted to the right and stated he is unable to turn it due  to pain. Pain started yesterday while pt was at home and at first it was only sore and as the night progressed became worse and unable to move. EXAM: CT HEAD WITHOUT CONTRAST CT CERVICAL SPINE WITHOUT CONTRAST TECHNIQUE: Multidetector CT imaging of the head and cervical spine was performed following the standard protocol without intravenous contrast. Multiplanar CT image reconstructions of the cervical spine were also generated. RADIATION DOSE REDUCTION: This exam was performed according to the departmental dose-optimization program which includes automated exposure control, adjustment of the mA and/or kV according to patient size and/or use of iterative reconstruction technique. COMPARISON:  08/29/2006. FINDINGS: CT HEAD FINDINGS Brain: No evidence of acute infarction, hemorrhage, hydrocephalus, extra-axial collection or mass lesion/mass effect. Vascular: No hyperdense vessel or unexpected calcification. Skull: Normal. Negative for fracture or focal lesion. Sinuses/Orbits: Globes and orbits are unremarkable. Mild right maxillary sinus mucosal thickening. Other: None. CT CERVICAL SPINE FINDINGS Alignment: Mild curvature, convex the left.  No spondylolisthesis. Skull base and vertebrae: No acute fracture. No primary bone lesion or focal pathologic process. Soft tissues and spinal canal: No prevertebral fluid or swelling.  No visible canal hematoma. Disc levels: Moderate loss of disc height at C5-C6. Mild disc bulging and endplate spurring. No significant stenosis. No evidence of a disc herniation. Facet degenerative changes, most

## 2021-06-28 NOTE — ED Triage Notes (Signed)
Patient complaining of neck pain and stiffness that started on Saturday and has worsened. Has recently been treated for shingles.  ?

## 2021-06-28 NOTE — Discharge Instructions (Signed)
Follow-up with your family doctor this week if not improved ?

## 2021-06-28 NOTE — ED Notes (Signed)
Pt unable to turn his head. Pt has his head tilted to the right and stated he is unable to turn it due to pain. Pain started yesterday while pt was at home and at first it was only sore and as the night progressed became worse and unable to move.   ?

## 2021-07-01 DIAGNOSIS — I10 Essential (primary) hypertension: Secondary | ICD-10-CM | POA: Diagnosis not present

## 2021-07-01 DIAGNOSIS — Z299 Encounter for prophylactic measures, unspecified: Secondary | ICD-10-CM | POA: Diagnosis not present

## 2021-07-01 DIAGNOSIS — Z7189 Other specified counseling: Secondary | ICD-10-CM | POA: Diagnosis not present

## 2021-07-01 DIAGNOSIS — Z789 Other specified health status: Secondary | ICD-10-CM | POA: Diagnosis not present

## 2021-07-01 DIAGNOSIS — Z0000 Encounter for general adult medical examination without abnormal findings: Secondary | ICD-10-CM | POA: Diagnosis not present

## 2021-08-10 DIAGNOSIS — Z299 Encounter for prophylactic measures, unspecified: Secondary | ICD-10-CM | POA: Diagnosis not present

## 2021-08-10 DIAGNOSIS — R5383 Other fatigue: Secondary | ICD-10-CM | POA: Diagnosis not present

## 2021-08-10 DIAGNOSIS — I10 Essential (primary) hypertension: Secondary | ICD-10-CM | POA: Diagnosis not present

## 2021-08-10 DIAGNOSIS — M109 Gout, unspecified: Secondary | ICD-10-CM | POA: Diagnosis not present

## 2021-08-10 DIAGNOSIS — Z79899 Other long term (current) drug therapy: Secondary | ICD-10-CM | POA: Diagnosis not present

## 2021-08-10 DIAGNOSIS — Z0000 Encounter for general adult medical examination without abnormal findings: Secondary | ICD-10-CM | POA: Diagnosis not present

## 2021-08-10 DIAGNOSIS — E7800 Pure hypercholesterolemia, unspecified: Secondary | ICD-10-CM | POA: Diagnosis not present

## 2021-08-10 DIAGNOSIS — M2550 Pain in unspecified joint: Secondary | ICD-10-CM | POA: Diagnosis not present

## 2021-08-13 ENCOUNTER — Other Ambulatory Visit: Payer: Self-pay

## 2021-09-10 DIAGNOSIS — I10 Essential (primary) hypertension: Secondary | ICD-10-CM | POA: Diagnosis not present

## 2021-09-10 DIAGNOSIS — E1165 Type 2 diabetes mellitus with hyperglycemia: Secondary | ICD-10-CM | POA: Diagnosis not present

## 2021-09-10 DIAGNOSIS — Z299 Encounter for prophylactic measures, unspecified: Secondary | ICD-10-CM | POA: Diagnosis not present

## 2021-09-10 DIAGNOSIS — L739 Follicular disorder, unspecified: Secondary | ICD-10-CM | POA: Diagnosis not present

## 2021-09-18 DIAGNOSIS — M159 Polyosteoarthritis, unspecified: Secondary | ICD-10-CM | POA: Diagnosis not present

## 2021-09-18 DIAGNOSIS — M109 Gout, unspecified: Secondary | ICD-10-CM | POA: Diagnosis not present

## 2021-09-18 DIAGNOSIS — I10 Essential (primary) hypertension: Secondary | ICD-10-CM | POA: Diagnosis not present

## 2021-09-18 DIAGNOSIS — E7800 Pure hypercholesterolemia, unspecified: Secondary | ICD-10-CM | POA: Diagnosis not present

## 2021-10-05 DIAGNOSIS — K2970 Gastritis, unspecified, without bleeding: Secondary | ICD-10-CM | POA: Diagnosis not present

## 2021-10-05 DIAGNOSIS — Z299 Encounter for prophylactic measures, unspecified: Secondary | ICD-10-CM | POA: Diagnosis not present

## 2021-10-05 DIAGNOSIS — E1165 Type 2 diabetes mellitus with hyperglycemia: Secondary | ICD-10-CM | POA: Diagnosis not present

## 2021-10-05 DIAGNOSIS — I10 Essential (primary) hypertension: Secondary | ICD-10-CM | POA: Diagnosis not present

## 2021-10-05 DIAGNOSIS — I779 Disorder of arteries and arterioles, unspecified: Secondary | ICD-10-CM | POA: Diagnosis not present

## 2021-10-13 DIAGNOSIS — K2970 Gastritis, unspecified, without bleeding: Secondary | ICD-10-CM | POA: Diagnosis not present

## 2021-10-13 DIAGNOSIS — R197 Diarrhea, unspecified: Secondary | ICD-10-CM | POA: Diagnosis not present

## 2021-10-13 DIAGNOSIS — Z299 Encounter for prophylactic measures, unspecified: Secondary | ICD-10-CM | POA: Diagnosis not present

## 2021-10-13 DIAGNOSIS — Z713 Dietary counseling and surveillance: Secondary | ICD-10-CM | POA: Diagnosis not present

## 2021-10-13 DIAGNOSIS — I10 Essential (primary) hypertension: Secondary | ICD-10-CM | POA: Diagnosis not present

## 2021-12-14 DIAGNOSIS — Z713 Dietary counseling and surveillance: Secondary | ICD-10-CM | POA: Diagnosis not present

## 2021-12-14 DIAGNOSIS — Z23 Encounter for immunization: Secondary | ICD-10-CM | POA: Diagnosis not present

## 2021-12-14 DIAGNOSIS — I10 Essential (primary) hypertension: Secondary | ICD-10-CM | POA: Diagnosis not present

## 2021-12-14 DIAGNOSIS — K529 Noninfective gastroenteritis and colitis, unspecified: Secondary | ICD-10-CM | POA: Diagnosis not present

## 2021-12-14 DIAGNOSIS — Z299 Encounter for prophylactic measures, unspecified: Secondary | ICD-10-CM | POA: Diagnosis not present

## 2021-12-17 ENCOUNTER — Encounter (INDEPENDENT_AMBULATORY_CARE_PROVIDER_SITE_OTHER): Payer: Self-pay | Admitting: *Deleted

## 2022-01-01 DIAGNOSIS — E1159 Type 2 diabetes mellitus with other circulatory complications: Secondary | ICD-10-CM | POA: Diagnosis not present

## 2022-01-01 DIAGNOSIS — I10 Essential (primary) hypertension: Secondary | ICD-10-CM | POA: Diagnosis not present

## 2022-01-01 DIAGNOSIS — I152 Hypertension secondary to endocrine disorders: Secondary | ICD-10-CM | POA: Diagnosis not present

## 2022-01-01 DIAGNOSIS — Z299 Encounter for prophylactic measures, unspecified: Secondary | ICD-10-CM | POA: Diagnosis not present

## 2022-01-04 DIAGNOSIS — E119 Type 2 diabetes mellitus without complications: Secondary | ICD-10-CM | POA: Diagnosis not present

## 2022-02-09 ENCOUNTER — Encounter (INDEPENDENT_AMBULATORY_CARE_PROVIDER_SITE_OTHER): Payer: Self-pay | Admitting: Gastroenterology

## 2022-02-25 ENCOUNTER — Ambulatory Visit (INDEPENDENT_AMBULATORY_CARE_PROVIDER_SITE_OTHER): Payer: Medicare Other | Admitting: Gastroenterology

## 2022-03-18 ENCOUNTER — Ambulatory Visit: Payer: Medicare Other | Admitting: Gastroenterology

## 2022-03-23 DIAGNOSIS — K121 Other forms of stomatitis: Secondary | ICD-10-CM | POA: Diagnosis not present

## 2022-03-23 DIAGNOSIS — Z299 Encounter for prophylactic measures, unspecified: Secondary | ICD-10-CM | POA: Diagnosis not present

## 2022-03-23 DIAGNOSIS — Z789 Other specified health status: Secondary | ICD-10-CM | POA: Diagnosis not present

## 2022-04-12 DIAGNOSIS — K148 Other diseases of tongue: Secondary | ICD-10-CM | POA: Diagnosis not present

## 2022-04-12 DIAGNOSIS — Z299 Encounter for prophylactic measures, unspecified: Secondary | ICD-10-CM | POA: Diagnosis not present

## 2022-04-12 DIAGNOSIS — I10 Essential (primary) hypertension: Secondary | ICD-10-CM | POA: Diagnosis not present

## 2022-04-15 DIAGNOSIS — G319 Degenerative disease of nervous system, unspecified: Secondary | ICD-10-CM | POA: Diagnosis not present

## 2022-04-15 DIAGNOSIS — Z299 Encounter for prophylactic measures, unspecified: Secondary | ICD-10-CM | POA: Diagnosis not present

## 2022-04-15 DIAGNOSIS — I739 Peripheral vascular disease, unspecified: Secondary | ICD-10-CM | POA: Diagnosis not present

## 2022-04-15 DIAGNOSIS — I10 Essential (primary) hypertension: Secondary | ICD-10-CM | POA: Diagnosis not present

## 2022-04-15 DIAGNOSIS — E1165 Type 2 diabetes mellitus with hyperglycemia: Secondary | ICD-10-CM | POA: Diagnosis not present

## 2022-05-03 DIAGNOSIS — Z299 Encounter for prophylactic measures, unspecified: Secondary | ICD-10-CM | POA: Diagnosis not present

## 2022-05-03 DIAGNOSIS — I10 Essential (primary) hypertension: Secondary | ICD-10-CM | POA: Diagnosis not present

## 2022-05-03 DIAGNOSIS — J069 Acute upper respiratory infection, unspecified: Secondary | ICD-10-CM | POA: Diagnosis not present

## 2022-06-14 DIAGNOSIS — Z0000 Encounter for general adult medical examination without abnormal findings: Secondary | ICD-10-CM | POA: Diagnosis not present

## 2022-06-14 DIAGNOSIS — Z7189 Other specified counseling: Secondary | ICD-10-CM | POA: Diagnosis not present

## 2022-06-14 DIAGNOSIS — Z87891 Personal history of nicotine dependence: Secondary | ICD-10-CM | POA: Diagnosis not present

## 2022-06-14 DIAGNOSIS — I700 Atherosclerosis of aorta: Secondary | ICD-10-CM | POA: Diagnosis not present

## 2022-06-14 DIAGNOSIS — Z299 Encounter for prophylactic measures, unspecified: Secondary | ICD-10-CM | POA: Diagnosis not present

## 2022-06-14 DIAGNOSIS — I779 Disorder of arteries and arterioles, unspecified: Secondary | ICD-10-CM | POA: Diagnosis not present

## 2022-06-14 DIAGNOSIS — I739 Peripheral vascular disease, unspecified: Secondary | ICD-10-CM | POA: Diagnosis not present

## 2022-06-14 DIAGNOSIS — I10 Essential (primary) hypertension: Secondary | ICD-10-CM | POA: Diagnosis not present

## 2022-06-29 ENCOUNTER — Ambulatory Visit: Payer: Medicare Other | Admitting: Urology

## 2022-06-29 ENCOUNTER — Encounter: Payer: Self-pay | Admitting: Urology

## 2022-06-29 VITALS — BP 199/76 | HR 65

## 2022-06-29 DIAGNOSIS — C61 Malignant neoplasm of prostate: Secondary | ICD-10-CM

## 2022-06-29 DIAGNOSIS — R310 Gross hematuria: Secondary | ICD-10-CM | POA: Diagnosis not present

## 2022-06-29 DIAGNOSIS — N200 Calculus of kidney: Secondary | ICD-10-CM | POA: Diagnosis not present

## 2022-06-29 DIAGNOSIS — N471 Phimosis: Secondary | ICD-10-CM

## 2022-06-29 MED ORDER — DIAZEPAM 5 MG PO TABS
ORAL_TABLET | ORAL | 0 refills | Status: DC
Start: 2022-06-29 — End: 2023-05-24

## 2022-06-29 MED ORDER — CLOTRIMAZOLE-BETAMETHASONE 1-0.05 % EX CREA
1.0000 | TOPICAL_CREAM | Freq: Two times a day (BID) | CUTANEOUS | 0 refills | Status: DC
Start: 2022-06-29 — End: 2022-08-26

## 2022-06-29 NOTE — Progress Notes (Signed)
istory of Present Illness:  : 10.14.2016: TRUS/Bx.  PSA 5.9, prostate volume 41 mL, PSA density 0.14.  4/12 cores positive, all in the right apical and mid prostate, all with GG 1 pathology. Strong family history of prostate cancer.  Active surveillance chosen.   9.25.2018: Prostate MRI-2 cm PI-RADS 5 lesion in left transition zone. 11.16.2018: Fusion biopsy.  Volume 42 mL.  4 cores were taken from the region of interest.  3 of these revealed GG 1 pattern, 1 was benign.  2/12 systematic cores-from the left mid medial and left apical medial also revealed GG 1 pattern.   7.7.2020: PSA 6.1.  He was to have been scheduled for repeat prostate MRI with possible fusion biopsy in 6 months.  Neither of these was performed.   7.5.2022: He apparently had a PSA drawn within the past month with Dr. Sherril Croon.  He states that it was 7.0.  He has had no bony pain.  He has no significant lower urinary tract symptoms that are overall stable.  He has had recent intermittent gross painless hematuria.  He really does not have any significant history of cigarette smoking.   8.16.2022:  He did have a CT scan on 7/18 revealing:  IMPRESSION: 1. Numerous bilateral nonobstructive renal calculi. 2. Urinary bladder wall thickening may partially be due to nondistention although a component of cystitis cannot be excluded. 3. Bilateral benign-appearing renal cysts. Some of the hypodense right renal lesions of the left kidney are technically too small to characterize. 4. Potential morphologic indicators of early cirrhosis in the liver. 5. Multilevel lumbar impingement. 6. 60 cubic mm left lower lobe subpleural nodule on image 6 series 15. No follow-up needed if patient is low-risk. Non-contrast chest CT can be considered in 12 months if patient is high-risk.  8. 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  Cystoscopy revealed bilobar hyperplasia with obstruction.  Normal  bladder.  5.22.2023: PSA 7.0  4.9.2024: He has not been seen here since November, 2022.  He does not know what his most recent PSAs have been.  No lower urinary tract symptoms.  He is not on tamsulosin anymore, despite this he voids well.  IPSS 7, quality-of-life score 2.  Biggest issue is inability to retract his foreskin and tenderness.   Past Medical History:  Diagnosis Date   Arthritis    Bradycardia    asymptomatic    History of kidney stones    Hypertension    Prostate cancer [facility])     Past Surgical History:  Procedure Laterality Date   COLONOSCOPY     FLEXIBLE SIGMOIDOSCOPY N/A 04/02/2015   Procedure: FLEXIBLE SIGMOIDOSCOPY;  Surgeon: Malissa Hippo, MD;  Location: [facility];  Service: Endoscopy;  Laterality: N/A;  1:45 - moved to 1/11 @ 8:30 - Ann to notify   KIDNEY STONE SURGERY     TOTAL KNEE ARTHROPLASTY Left 05/02/2017   Procedure: LEFT TOTAL KNEE ARTHROPLASTY;  Surgeon: Ollen Gross, MD;  Location: [facility];  Service: Orthopedics;  Laterality: Left;    Home Medications:  Allergies as of 06/29/2022       Reactions   Dilaudid [hydromorphone Hcl] Anaphylaxis   Dilaudid [hydromorphone]    Other reaction(s): Irregular heart rate        Medication List        Accurate as of June 29, 2022  7:56 AM. If you have any questions, ask your nurse or doctor.          allopurinol  100 MG tablet Commonly known as: ZYLOPRIM Take 100 mg by mouth daily.   aspirin 81 MG chewable tablet Chew by mouth daily.   cetirizine 10 MG tablet Commonly known as: ZYRTEC Take 10 mg by mouth at bedtime.   clobetasol cream 0.05 % Commonly known as: TEMOVATE Apply 1 application. topically 2 (two) times daily.   cyclobenzaprine 10 MG tablet Commonly known as: FLEXERIL Take 1 tablet (10 mg total) by mouth 3 (three) times daily as needed for muscle spasms.   gabapentin 400 MG capsule Commonly known as: NEURONTIN Take 400 mg by mouth 2 (two) times daily.   GLUCOSAMINE CHOND  TRIPLE/VIT D PO Take 2 tablets by mouth daily.   indomethacin 25 MG capsule Commonly known as: INDOCIN Take 25 mg by mouth 3 (three) times daily.   loperamide 2 MG capsule Commonly known as: IMODIUM Take 1 capsule (2 mg total) by mouth 2 (two) times daily.   meclizine 25 MG tablet Commonly known as: ANTIVERT Take 25 mg by mouth 3 (three) times daily as needed for dizziness. Take 1/2 to 1 tablet 3 times a day as needed for Dizziness   Melatonin 5 MG Caps Take by mouth.   methocarbamol 500 MG tablet Commonly known as: ROBAXIN Take 1 tablet (500 mg total) by mouth every 6 (six) hours as needed for muscle spasms.   metoprolol succinate 25 MG 24 hr tablet Commonly known as: TOPROL-XL Take 25 mg by mouth 2 (two) times daily.   multivitamin tablet Take 1 tablet by mouth daily.   oxyCODONE 5 MG immediate release tablet Commonly known as: Oxy IR/ROXICODONE Take 1-2 tablets (5-10 mg total) by mouth every 4 (four) hours as needed for moderate pain or severe pain.   oxyCODONE-acetaminophen 5-325 MG tablet Commonly known as: Percocet Take 1 tablet by mouth every 6 (six) hours as needed.   rivaroxaban 10 MG Tabs tablet Commonly known as: XARELTO Take 1 tablet (10 mg total) by mouth daily with breakfast. Take Xarelto for two and a half more weeks following discharge from the hospital, then discontinue Xarelto. Once the patient has completed the Xarelto, they may resume the 81 mg Aspirin.   rosuvastatin 5 MG tablet Commonly known as: CRESTOR Take 5 mg by mouth daily.   tamsulosin 0.4 MG Caps capsule Commonly known as: FLOMAX Take 1 capsule (0.4 mg total) by mouth daily after supper.   triamcinolone cream 0.1 % Commonly known as: KENALOG Apply 1 application topically 2 (two) times daily.   valACYclovir 1000 MG tablet Commonly known as: VALTREX Take 1 tablet (1,000 mg total) by mouth 3 (three) times daily.        Allergies:  Allergies  Allergen Reactions   Dilaudid  [Hydromorphone Hcl] Anaphylaxis   Dilaudid [Hydromorphone]     Other reaction(s): Irregular heart rate    No family history on file.  Social History:  reports that he has quit smoking. His smoking use included cigarettes. He has never used smokeless tobacco. He reports that he does not drink alcohol and does not use drugs.  ROS: A complete review of systems was performed.  All systems are negative except for pertinent findings as noted.  Physical Exam:  Vital signs in last 24 hours: There were no vitals taken for this visit. Constitutional:  Alert and oriented, No acute distress Cardiovascular: Regular rate  Respiratory: Normal respiratory effort Genitourinary: Phallus uncircumcised, phimosis/balanitis present, testes are descended bilaterally and non-tender and without masses, scrotum is normal in appearance without lesions or  masses, perineum is normal on inspection. Lymphatic: No lymphadenopathy Neurologic: Grossly intact, no focal deficits Psychiatric: Normal mood and affect  I have reviewed prior pt notes  I have reviewed urinalysis results  I have independently reviewed prior imaging--CT scan, MRI scan results  I have reviewed prior PSA results  Pathology results reviewed  IPSS score sheet reviewed     Impression/Assessment:   Prostate cancer, initial diagnosis in 2016.  Grade group 1 pathology.  He has had incomplete follow-up but nonprogression thus far  Balanitis/phimosis  Plan:  1.  Clotrimazole/betamethasone cream given  2.  PSA, basic metabolic panel drawn in advance of eventual MRI of prostate  3.  Diazepam sent for sedation

## 2022-06-30 LAB — BUN+CREAT
BUN/Creatinine Ratio: 16 (ref 10–24)
BUN: 20 mg/dL (ref 8–27)
Creatinine, Ser: 1.26 mg/dL (ref 0.76–1.27)
eGFR: 58 mL/min/{1.73_m2} — ABNORMAL LOW (ref >59)

## 2022-06-30 LAB — PSA: Prostate Specific Ag, Serum: 6.1 ng/mL — ABNORMAL HIGH (ref 0.0–4.0)

## 2022-07-02 ENCOUNTER — Telehealth: Payer: Self-pay

## 2022-07-02 NOTE — Telephone Encounter (Signed)
-----   Message from Marcine Matar, MD sent at 07/02/2022 12:55 PM EDT ----- Notify pt--psa 6.1 ----- Message ----- From: Troy Sine, CMA Sent: 06/30/2022   7:41 AM EDT To: Marcine Matar, MD  Please review

## 2022-07-02 NOTE — Telephone Encounter (Signed)
Tried calling patient, unable to leave voice message. Will follow up

## 2022-07-05 NOTE — Telephone Encounter (Signed)
Tried calling patient with no answer, will follow up

## 2022-07-05 NOTE — Telephone Encounter (Signed)
-----   Message from Stephen Dahlstedt, MD sent at 07/02/2022 12:55 PM EDT ----- Notify pt--psa 6.1 ----- Message ----- From: Nuel Dejaynes R, CMA Sent: 06/30/2022   7:41 AM EDT To: Stephen Dahlstedt, MD  Please review  

## 2022-07-06 NOTE — Telephone Encounter (Signed)
Patient is aware of PSA  at 6.1 and patient voiced understanding.

## 2022-07-06 NOTE — Telephone Encounter (Signed)
-----   Message from Stephen Dahlstedt, MD sent at 07/02/2022 12:55 PM EDT ----- Notify pt--psa 6.1 ----- Message ----- From: Jocabed Cheese R, CMA Sent: 06/30/2022   7:41 AM EDT To: Stephen Dahlstedt, MD  Please review  

## 2022-07-10 ENCOUNTER — Other Ambulatory Visit: Payer: Self-pay | Admitting: Urology

## 2022-07-10 DIAGNOSIS — T1590XA Foreign body on external eye, part unspecified, unspecified eye, initial encounter: Secondary | ICD-10-CM

## 2022-07-10 DIAGNOSIS — C61 Malignant neoplasm of prostate: Secondary | ICD-10-CM

## 2022-07-30 DIAGNOSIS — B0229 Other postherpetic nervous system involvement: Secondary | ICD-10-CM | POA: Diagnosis not present

## 2022-07-30 DIAGNOSIS — I10 Essential (primary) hypertension: Secondary | ICD-10-CM | POA: Diagnosis not present

## 2022-07-30 DIAGNOSIS — Z299 Encounter for prophylactic measures, unspecified: Secondary | ICD-10-CM | POA: Diagnosis not present

## 2022-07-30 DIAGNOSIS — E1159 Type 2 diabetes mellitus with other circulatory complications: Secondary | ICD-10-CM | POA: Diagnosis not present

## 2022-07-30 DIAGNOSIS — I152 Hypertension secondary to endocrine disorders: Secondary | ICD-10-CM | POA: Diagnosis not present

## 2022-08-04 ENCOUNTER — Other Ambulatory Visit: Payer: Medicare Other

## 2022-08-04 ENCOUNTER — Inpatient Hospital Stay: Admission: RE | Admit: 2022-08-04 | Payer: Medicare Other | Source: Ambulatory Visit

## 2022-08-09 DIAGNOSIS — Z299 Encounter for prophylactic measures, unspecified: Secondary | ICD-10-CM | POA: Diagnosis not present

## 2022-08-09 DIAGNOSIS — I700 Atherosclerosis of aorta: Secondary | ICD-10-CM | POA: Diagnosis not present

## 2022-08-09 DIAGNOSIS — I10 Essential (primary) hypertension: Secondary | ICD-10-CM | POA: Diagnosis not present

## 2022-08-09 DIAGNOSIS — E1159 Type 2 diabetes mellitus with other circulatory complications: Secondary | ICD-10-CM | POA: Diagnosis not present

## 2022-08-09 DIAGNOSIS — I779 Disorder of arteries and arterioles, unspecified: Secondary | ICD-10-CM | POA: Diagnosis not present

## 2022-08-09 DIAGNOSIS — I152 Hypertension secondary to endocrine disorders: Secondary | ICD-10-CM | POA: Diagnosis not present

## 2022-08-17 DIAGNOSIS — Z87891 Personal history of nicotine dependence: Secondary | ICD-10-CM | POA: Diagnosis not present

## 2022-08-17 DIAGNOSIS — Z0000 Encounter for general adult medical examination without abnormal findings: Secondary | ICD-10-CM | POA: Diagnosis not present

## 2022-08-17 DIAGNOSIS — Z299 Encounter for prophylactic measures, unspecified: Secondary | ICD-10-CM | POA: Diagnosis not present

## 2022-08-17 DIAGNOSIS — I10 Essential (primary) hypertension: Secondary | ICD-10-CM | POA: Diagnosis not present

## 2022-08-17 DIAGNOSIS — R5383 Other fatigue: Secondary | ICD-10-CM | POA: Diagnosis not present

## 2022-08-17 DIAGNOSIS — Z79899 Other long term (current) drug therapy: Secondary | ICD-10-CM | POA: Diagnosis not present

## 2022-08-17 DIAGNOSIS — E7800 Pure hypercholesterolemia, unspecified: Secondary | ICD-10-CM | POA: Diagnosis not present

## 2022-08-25 ENCOUNTER — Other Ambulatory Visit: Payer: Self-pay | Admitting: Urology

## 2022-08-25 DIAGNOSIS — N471 Phimosis: Secondary | ICD-10-CM

## 2022-09-10 DIAGNOSIS — Z299 Encounter for prophylactic measures, unspecified: Secondary | ICD-10-CM | POA: Diagnosis not present

## 2022-09-10 DIAGNOSIS — I10 Essential (primary) hypertension: Secondary | ICD-10-CM | POA: Diagnosis not present

## 2022-09-10 DIAGNOSIS — H9202 Otalgia, left ear: Secondary | ICD-10-CM | POA: Diagnosis not present

## 2022-09-10 DIAGNOSIS — H6092 Unspecified otitis externa, left ear: Secondary | ICD-10-CM | POA: Diagnosis not present

## 2022-09-10 DIAGNOSIS — I779 Disorder of arteries and arterioles, unspecified: Secondary | ICD-10-CM | POA: Diagnosis not present

## 2022-09-27 DIAGNOSIS — I779 Disorder of arteries and arterioles, unspecified: Secondary | ICD-10-CM | POA: Diagnosis not present

## 2022-09-27 DIAGNOSIS — Z299 Encounter for prophylactic measures, unspecified: Secondary | ICD-10-CM | POA: Diagnosis not present

## 2022-09-27 DIAGNOSIS — I152 Hypertension secondary to endocrine disorders: Secondary | ICD-10-CM | POA: Diagnosis not present

## 2022-09-27 DIAGNOSIS — E1159 Type 2 diabetes mellitus with other circulatory complications: Secondary | ICD-10-CM | POA: Diagnosis not present

## 2022-09-27 DIAGNOSIS — G319 Degenerative disease of nervous system, unspecified: Secondary | ICD-10-CM | POA: Diagnosis not present

## 2022-09-27 DIAGNOSIS — I10 Essential (primary) hypertension: Secondary | ICD-10-CM | POA: Diagnosis not present

## 2022-10-04 DIAGNOSIS — I10 Essential (primary) hypertension: Secondary | ICD-10-CM | POA: Diagnosis not present

## 2022-10-04 DIAGNOSIS — T25032D Burn of unspecified degree of left toe(s) (nail), subsequent encounter: Secondary | ICD-10-CM | POA: Diagnosis not present

## 2022-10-04 DIAGNOSIS — Z299 Encounter for prophylactic measures, unspecified: Secondary | ICD-10-CM | POA: Diagnosis not present

## 2022-10-04 DIAGNOSIS — L97529 Non-pressure chronic ulcer of other part of left foot with unspecified severity: Secondary | ICD-10-CM | POA: Diagnosis not present

## 2022-11-18 DIAGNOSIS — Z299 Encounter for prophylactic measures, unspecified: Secondary | ICD-10-CM | POA: Diagnosis not present

## 2022-11-18 DIAGNOSIS — I152 Hypertension secondary to endocrine disorders: Secondary | ICD-10-CM | POA: Diagnosis not present

## 2022-11-18 DIAGNOSIS — E1159 Type 2 diabetes mellitus with other circulatory complications: Secondary | ICD-10-CM | POA: Diagnosis not present

## 2022-11-18 DIAGNOSIS — I10 Essential (primary) hypertension: Secondary | ICD-10-CM | POA: Diagnosis not present

## 2022-12-21 DIAGNOSIS — L570 Actinic keratosis: Secondary | ICD-10-CM | POA: Diagnosis not present

## 2022-12-21 DIAGNOSIS — C44311 Basal cell carcinoma of skin of nose: Secondary | ICD-10-CM | POA: Diagnosis not present

## 2022-12-21 DIAGNOSIS — D485 Neoplasm of uncertain behavior of skin: Secondary | ICD-10-CM | POA: Diagnosis not present

## 2022-12-28 ENCOUNTER — Encounter (HOSPITAL_COMMUNITY)
Admission: EM | Discharge status: Against Medical Advice | Payer: Self-pay | Source: Home / Self Care | Attending: Family Medicine

## 2022-12-28 ENCOUNTER — Inpatient Hospital Stay (HOSPITAL_COMMUNITY): Payer: Medicare Other

## 2022-12-28 ENCOUNTER — Other Ambulatory Visit: Payer: Self-pay

## 2022-12-28 ENCOUNTER — Emergency Department (HOSPITAL_COMMUNITY): Payer: Medicare Other | Admitting: Registered Nurse

## 2022-12-28 ENCOUNTER — Emergency Department (HOSPITAL_COMMUNITY): Payer: Medicare Other

## 2022-12-28 ENCOUNTER — Encounter (HOSPITAL_COMMUNITY): Payer: Self-pay

## 2022-12-28 ENCOUNTER — Inpatient Hospital Stay (HOSPITAL_COMMUNITY)
Admission: EM | Admit: 2022-12-28 | Discharge: 2022-12-29 | DRG: 853 | Discharge status: Against Medical Advice | Payer: Medicare Other | Attending: Family Medicine | Admitting: Family Medicine

## 2022-12-28 DIAGNOSIS — Z87442 Personal history of urinary calculi: Secondary | ICD-10-CM | POA: Diagnosis not present

## 2022-12-28 DIAGNOSIS — E669 Obesity, unspecified: Secondary | ICD-10-CM | POA: Diagnosis present

## 2022-12-28 DIAGNOSIS — Z66 Do not resuscitate: Secondary | ICD-10-CM | POA: Diagnosis present

## 2022-12-28 DIAGNOSIS — I129 Hypertensive chronic kidney disease with stage 1 through stage 4 chronic kidney disease, or unspecified chronic kidney disease: Secondary | ICD-10-CM | POA: Diagnosis not present

## 2022-12-28 DIAGNOSIS — Z79899 Other long term (current) drug therapy: Secondary | ICD-10-CM | POA: Diagnosis not present

## 2022-12-28 DIAGNOSIS — N179 Acute kidney failure, unspecified: Secondary | ICD-10-CM

## 2022-12-28 DIAGNOSIS — N201 Calculus of ureter: Secondary | ICD-10-CM

## 2022-12-28 DIAGNOSIS — R6520 Severe sepsis without septic shock: Secondary | ICD-10-CM | POA: Diagnosis not present

## 2022-12-28 DIAGNOSIS — A419 Sepsis, unspecified organism: Secondary | ICD-10-CM | POA: Diagnosis not present

## 2022-12-28 DIAGNOSIS — C61 Malignant neoplasm of prostate: Secondary | ICD-10-CM | POA: Diagnosis present

## 2022-12-28 DIAGNOSIS — Z96652 Presence of left artificial knee joint: Secondary | ICD-10-CM | POA: Diagnosis not present

## 2022-12-28 DIAGNOSIS — N390 Urinary tract infection, site not specified: Secondary | ICD-10-CM | POA: Diagnosis not present

## 2022-12-28 DIAGNOSIS — E860 Dehydration: Secondary | ICD-10-CM | POA: Diagnosis not present

## 2022-12-28 DIAGNOSIS — E872 Acidosis, unspecified: Secondary | ICD-10-CM | POA: Diagnosis present

## 2022-12-28 DIAGNOSIS — E1122 Type 2 diabetes mellitus with diabetic chronic kidney disease: Secondary | ICD-10-CM | POA: Diagnosis not present

## 2022-12-28 DIAGNOSIS — G9341 Metabolic encephalopathy: Secondary | ICD-10-CM | POA: Diagnosis not present

## 2022-12-28 DIAGNOSIS — J810 Acute pulmonary edema: Secondary | ICD-10-CM | POA: Diagnosis not present

## 2022-12-28 DIAGNOSIS — K219 Gastro-esophageal reflux disease without esophagitis: Secondary | ICD-10-CM | POA: Diagnosis present

## 2022-12-28 DIAGNOSIS — Z466 Encounter for fitting and adjustment of urinary device: Secondary | ICD-10-CM | POA: Diagnosis not present

## 2022-12-28 DIAGNOSIS — N183 Chronic kidney disease, stage 3 unspecified: Secondary | ICD-10-CM | POA: Diagnosis not present

## 2022-12-28 DIAGNOSIS — U071 COVID-19: Secondary | ICD-10-CM | POA: Diagnosis not present

## 2022-12-28 DIAGNOSIS — Z7901 Long term (current) use of anticoagulants: Secondary | ICD-10-CM | POA: Diagnosis not present

## 2022-12-28 DIAGNOSIS — J9811 Atelectasis: Secondary | ICD-10-CM | POA: Diagnosis not present

## 2022-12-28 DIAGNOSIS — Z1152 Encounter for screening for COVID-19: Secondary | ICD-10-CM | POA: Diagnosis not present

## 2022-12-28 DIAGNOSIS — R339 Retention of urine, unspecified: Secondary | ICD-10-CM | POA: Diagnosis not present

## 2022-12-28 DIAGNOSIS — R918 Other nonspecific abnormal finding of lung field: Secondary | ICD-10-CM | POA: Diagnosis not present

## 2022-12-28 DIAGNOSIS — E782 Mixed hyperlipidemia: Secondary | ICD-10-CM | POA: Diagnosis not present

## 2022-12-28 DIAGNOSIS — G9340 Encephalopathy, unspecified: Secondary | ICD-10-CM

## 2022-12-28 DIAGNOSIS — Z885 Allergy status to narcotic agent status: Secondary | ICD-10-CM

## 2022-12-28 DIAGNOSIS — Z87891 Personal history of nicotine dependence: Secondary | ICD-10-CM

## 2022-12-28 DIAGNOSIS — J9601 Acute respiratory failure with hypoxia: Secondary | ICD-10-CM | POA: Diagnosis not present

## 2022-12-28 DIAGNOSIS — I10 Essential (primary) hypertension: Secondary | ICD-10-CM | POA: Diagnosis not present

## 2022-12-28 DIAGNOSIS — R110 Nausea: Secondary | ICD-10-CM | POA: Diagnosis not present

## 2022-12-28 DIAGNOSIS — Z960 Presence of urogenital implants: Secondary | ICD-10-CM | POA: Diagnosis not present

## 2022-12-28 DIAGNOSIS — N132 Hydronephrosis with renal and ureteral calculous obstruction: Secondary | ICD-10-CM | POA: Diagnosis present

## 2022-12-28 DIAGNOSIS — Z7982 Long term (current) use of aspirin: Secondary | ICD-10-CM | POA: Diagnosis not present

## 2022-12-28 DIAGNOSIS — R000 Tachycardia, unspecified: Secondary | ICD-10-CM | POA: Diagnosis not present

## 2022-12-28 DIAGNOSIS — R059 Cough, unspecified: Secondary | ICD-10-CM | POA: Diagnosis not present

## 2022-12-28 DIAGNOSIS — R509 Fever, unspecified: Secondary | ICD-10-CM | POA: Diagnosis not present

## 2022-12-28 DIAGNOSIS — Z6829 Body mass index (BMI) 29.0-29.9, adult: Secondary | ICD-10-CM

## 2022-12-28 DIAGNOSIS — K4090 Unilateral inguinal hernia, without obstruction or gangrene, not specified as recurrent: Secondary | ICD-10-CM | POA: Diagnosis not present

## 2022-12-28 DIAGNOSIS — R0682 Tachypnea, not elsewhere classified: Secondary | ICD-10-CM | POA: Diagnosis not present

## 2022-12-28 HISTORY — PX: CYSTOSCOPY WITH STENT PLACEMENT: SHX5790

## 2022-12-28 LAB — COMPREHENSIVE METABOLIC PANEL
ALT: 29 U/L (ref 0–44)
AST: 29 U/L (ref 15–41)
Albumin: 3.3 g/dL — ABNORMAL LOW (ref 3.5–5.0)
Alkaline Phosphatase: 170 U/L — ABNORMAL HIGH (ref 38–126)
Anion gap: 13 (ref 5–15)
BUN: 37 mg/dL — ABNORMAL HIGH (ref 8–23)
CO2: 16 mmol/L — ABNORMAL LOW (ref 22–32)
Calcium: 8.7 mg/dL — ABNORMAL LOW (ref 8.9–10.3)
Chloride: 106 mmol/L (ref 98–111)
Creatinine, Ser: 2.62 mg/dL — ABNORMAL HIGH (ref 0.61–1.24)
GFR, Estimated: 24 mL/min — ABNORMAL LOW (ref >60)
Glucose, Bld: 116 mg/dL — ABNORMAL HIGH (ref 70–99)
Potassium: 4.1 mmol/L (ref 3.5–5.1)
Sodium: 135 mmol/L (ref 135–145)
Total Bilirubin: 2.1 mg/dL — ABNORMAL HIGH (ref 0.3–1.2)
Total Protein: 8.2 g/dL — ABNORMAL HIGH (ref 6.5–8.1)

## 2022-12-28 LAB — CBC WITH DIFFERENTIAL/PLATELET
Abs Immature Granulocytes: 0.31 10*3/uL — ABNORMAL HIGH (ref 0.00–0.07)
Basophils Absolute: 0.1 10*3/uL (ref 0.0–0.1)
Basophils Relative: 0 %
Eosinophils Absolute: 0.1 10*3/uL (ref 0.0–0.5)
Eosinophils Relative: 0 %
HCT: 39.5 % (ref 39.0–52.0)
Hemoglobin: 12.8 g/dL — ABNORMAL LOW (ref 13.0–17.0)
Immature Granulocytes: 1 %
Lymphocytes Relative: 4 %
Lymphs Abs: 1 10*3/uL (ref 0.7–4.0)
MCH: 30.4 pg (ref 26.0–34.0)
MCHC: 32.4 g/dL (ref 30.0–36.0)
MCV: 93.8 fL (ref 80.0–100.0)
Monocytes Absolute: 0.4 10*3/uL (ref 0.1–1.0)
Monocytes Relative: 2 %
Neutro Abs: 21 10*3/uL — ABNORMAL HIGH (ref 1.7–7.7)
Neutrophils Relative %: 93 %
Platelets: 244 10*3/uL (ref 150–400)
RBC: 4.21 MIL/uL — ABNORMAL LOW (ref 4.22–5.81)
RDW: 12.8 % (ref 11.5–15.5)
WBC: 22.9 10*3/uL — ABNORMAL HIGH (ref 4.0–10.5)
nRBC: 0 % (ref 0.0–0.2)

## 2022-12-28 LAB — RESP PANEL BY RT-PCR (RSV, FLU A&B, COVID)  RVPGX2
Influenza A by PCR: NEGATIVE
Influenza B by PCR: NEGATIVE
Resp Syncytial Virus by PCR: NEGATIVE
SARS Coronavirus 2 by RT PCR: NEGATIVE

## 2022-12-28 LAB — URINALYSIS, W/ REFLEX TO CULTURE (INFECTION SUSPECTED)
Bilirubin Urine: NEGATIVE
Glucose, UA: NEGATIVE mg/dL
Ketones, ur: NEGATIVE mg/dL
Nitrite: NEGATIVE
Protein, ur: 100 mg/dL — AB
Specific Gravity, Urine: 1.019 (ref 1.005–1.030)
pH: 5 (ref 5.0–8.0)

## 2022-12-28 LAB — LACTIC ACID, PLASMA
Lactic Acid, Venous: 2.9 mmol/L (ref 0.5–1.9)
Lactic Acid, Venous: 3.1 mmol/L (ref 0.5–1.9)
Lactic Acid, Venous: 5.5 mmol/L (ref 0.5–1.9)

## 2022-12-28 SURGERY — CYSTOSCOPY, WITH STENT INSERTION
Anesthesia: General | Laterality: Right

## 2022-12-28 MED ORDER — VANCOMYCIN HCL 2000 MG/400ML IV SOLN
2000.0000 mg | Freq: Once | INTRAVENOUS | Status: AC
  Administered 2022-12-28: 2000 mg via INTRAVENOUS
  Filled 2022-12-28: qty 400

## 2022-12-28 MED ORDER — METRONIDAZOLE 500 MG/100ML IV SOLN
500.0000 mg | Freq: Once | INTRAVENOUS | Status: AC
  Administered 2022-12-28: 500 mg via INTRAVENOUS
  Filled 2022-12-28: qty 100

## 2022-12-28 MED ORDER — VANCOMYCIN HCL IN DEXTROSE 1-5 GM/200ML-% IV SOLN: 1000.0000 mg | Freq: Once | INTRAVENOUS | Status: DC

## 2022-12-28 MED ORDER — FENTANYL CITRATE (PF) 250 MCG/5ML IJ SOLN
INTRAMUSCULAR | Status: AC
  Filled 2022-12-28: qty 5

## 2022-12-28 MED ORDER — SODIUM CHLORIDE 0.9 % IV SOLN
2.0000 g | INTRAVENOUS | Status: DC
  Administered 2022-12-29: 2 g via INTRAVENOUS
  Filled 2022-12-28: qty 12.5

## 2022-12-28 MED ORDER — LACTATED RINGERS IV BOLUS
30.0000 mL/kg | Freq: Once | INTRAVENOUS | Status: AC
  Administered 2022-12-28: 2925 mL via INTRAVENOUS

## 2022-12-28 MED ORDER — IPRATROPIUM-ALBUTEROL 0.5-2.5 (3) MG/3ML IN SOLN
3.0000 mL | Freq: Four times a day (QID) | RESPIRATORY_TRACT | Status: DC
  Administered 2022-12-29 (×2): 3 mL via RESPIRATORY_TRACT
  Filled 2022-12-28: qty 3

## 2022-12-28 MED ORDER — INSULIN ASPART 100 UNIT/ML IJ SOLN
0.0000 [IU] | Freq: Three times a day (TID) | INTRAMUSCULAR | Status: DC
  Administered 2022-12-29: 2 [IU] via SUBCUTANEOUS
  Administered 2022-12-29 (×2): 1 [IU] via SUBCUTANEOUS

## 2022-12-28 MED ORDER — ALBUTEROL SULFATE (2.5 MG/3ML) 0.083% IN NEBU
INHALATION_SOLUTION | RESPIRATORY_TRACT | Status: AC
  Filled 2022-12-28: qty 3

## 2022-12-28 MED ORDER — DEXAMETHASONE SODIUM PHOSPHATE 10 MG/ML IJ SOLN
INTRAMUSCULAR | Status: DC | PRN
  Administered 2022-12-28: 5 mg via INTRAVENOUS

## 2022-12-28 MED ORDER — ACETAMINOPHEN 160 MG/5ML PO SOLN: 1000.0000 mg | Freq: Once | ORAL | Status: DC | PRN

## 2022-12-28 MED ORDER — ACETAMINOPHEN 10 MG/ML IV SOLN: 1000.0000 mg | Freq: Once | INTRAVENOUS | Status: DC | PRN

## 2022-12-28 MED ORDER — ACETAMINOPHEN 500 MG PO TABS: 1000.0000 mg | ORAL_TABLET | Freq: Once | ORAL | Status: DC | PRN

## 2022-12-28 MED ORDER — VANCOMYCIN HCL 1500 MG/300ML IV SOLN: 1500.0000 mg | INTRAVENOUS | Status: DC

## 2022-12-28 MED ORDER — PROPOFOL 10 MG/ML IV BOLUS
INTRAVENOUS | Status: DC | PRN
  Administered 2022-12-28: 180 mg via INTRAVENOUS

## 2022-12-28 MED ORDER — SUCCINYLCHOLINE CHLORIDE 200 MG/10ML IV SOSY
PREFILLED_SYRINGE | INTRAVENOUS | Status: DC | PRN
  Administered 2022-12-28: 160 mg via INTRAVENOUS

## 2022-12-28 MED ORDER — IPRATROPIUM-ALBUTEROL 0.5-2.5 (3) MG/3ML IN SOLN
RESPIRATORY_TRACT | Status: AC
  Filled 2022-12-28: qty 3

## 2022-12-28 MED ORDER — SODIUM CHLORIDE 0.9% FLUSH
3.0000 mL | Freq: Two times a day (BID) | INTRAVENOUS | Status: DC
  Administered 2022-12-29: 3 mL via INTRAVENOUS

## 2022-12-28 MED ORDER — IPRATROPIUM-ALBUTEROL 0.5-2.5 (3) MG/3ML IN SOLN
3.0000 mL | RESPIRATORY_TRACT | Status: DC
  Administered 2022-12-28: 3 mL via RESPIRATORY_TRACT

## 2022-12-28 MED ORDER — FENTANYL CITRATE (PF) 250 MCG/5ML IJ SOLN
INTRAMUSCULAR | Status: DC | PRN
  Administered 2022-12-28: 100 ug via INTRAVENOUS

## 2022-12-28 MED ORDER — PROPOFOL 10 MG/ML IV BOLUS
INTRAVENOUS | Status: AC
  Filled 2022-12-28: qty 20

## 2022-12-28 MED ORDER — SODIUM CHLORIDE 0.9 % IV SOLN
2.0000 g | Freq: Once | INTRAVENOUS | Status: AC
  Administered 2022-12-28: 2 g via INTRAVENOUS
  Filled 2022-12-28: qty 12.5

## 2022-12-28 MED ORDER — ASPIRIN 81 MG PO CHEW
81.0000 mg | CHEWABLE_TABLET | Freq: Every day | ORAL | Status: DC
  Administered 2022-12-29: 81 mg via ORAL
  Filled 2022-12-28: qty 1

## 2022-12-28 MED ORDER — LIDOCAINE 2% (20 MG/ML) 5 ML SYRINGE
INTRAMUSCULAR | Status: DC | PRN
  Administered 2022-12-28: 60 mg via INTRAVENOUS

## 2022-12-28 MED ORDER — AMISULPRIDE (ANTIEMETIC) 5 MG/2ML IV SOLN: 10.0000 mg | Freq: Once | INTRAVENOUS | Status: DC | PRN

## 2022-12-28 MED ORDER — ONDANSETRON HCL 4 MG/2ML IJ SOLN
INTRAMUSCULAR | Status: DC | PRN
  Administered 2022-12-28: 4 mg via INTRAVENOUS

## 2022-12-28 MED ORDER — 0.9 % SODIUM CHLORIDE (POUR BTL) OPTIME
TOPICAL | Status: DC | PRN
  Administered 2022-12-28: 1000 mL

## 2022-12-28 MED ORDER — ROSUVASTATIN CALCIUM 5 MG PO TABS
5.0000 mg | ORAL_TABLET | Freq: Every day | ORAL | Status: DC
  Administered 2022-12-29: 5 mg via ORAL
  Filled 2022-12-28: qty 1

## 2022-12-28 MED ORDER — SUGAMMADEX SODIUM 200 MG/2ML IV SOLN
INTRAVENOUS | Status: DC | PRN
  Administered 2022-12-28: 100 mg via INTRAVENOUS

## 2022-12-28 MED ORDER — ACETAMINOPHEN 325 MG PO TABS
650.0000 mg | ORAL_TABLET | Freq: Once | ORAL | Status: AC | PRN
  Administered 2022-12-28: 650 mg via ORAL
  Filled 2022-12-28: qty 2

## 2022-12-28 MED ORDER — ROCURONIUM BROMIDE 10 MG/ML (PF) SYRINGE
PREFILLED_SYRINGE | INTRAVENOUS | Status: DC | PRN
Start: 2022-12-28 — End: 2022-12-28
  Administered 2022-12-28: 20 mg via INTRAVENOUS

## 2022-12-28 MED ORDER — LACTATED RINGERS IV SOLN: INTRAVENOUS | Status: DC

## 2022-12-28 MED ORDER — ALBUTEROL SULFATE (2.5 MG/3ML) 0.083% IN NEBU
2.5000 mg | INHALATION_SOLUTION | Freq: Once | RESPIRATORY_TRACT | Status: AC
  Administered 2022-12-28: 2.5 mg via RESPIRATORY_TRACT

## 2022-12-28 MED ORDER — IOHEXOL 300 MG/ML  SOLN
INTRAMUSCULAR | Status: DC | PRN
  Administered 2022-12-28: 7 mL via URETHRAL

## 2022-12-28 SURGICAL SUPPLY — 18 items
BAG DRN RND TRDRP ANRFLXCHMBR (UROLOGICAL SUPPLIES) ×1
BAG URINE DRAIN 2000ML AR STRL (UROLOGICAL SUPPLIES) ×2 IMPLANT
BAG URO CATCHER STRL LF (MISCELLANEOUS) ×2 IMPLANT
CATH FOLEY 2WAY SLVR 5CC 18FR (CATHETERS) IMPLANT
CATH URETL OPEN END 6FR 70 (CATHETERS) ×2 IMPLANT
GLOVE BIO SURGEON STRL SZ7.5 (GLOVE) ×2 IMPLANT
GOWN STRL REUS W/ TWL LRG LVL3 (GOWN DISPOSABLE) ×2 IMPLANT
GOWN STRL REUS W/ TWL XL LVL3 (GOWN DISPOSABLE) ×2 IMPLANT
GOWN STRL REUS W/TWL LRG LVL3 (GOWN DISPOSABLE) ×1
GOWN STRL REUS W/TWL XL LVL3 (GOWN DISPOSABLE) ×1
GUIDEWIRE ANG ZIPWIRE 038X150 (WIRE) IMPLANT
GUIDEWIRE STR DUAL SENSOR (WIRE) ×2 IMPLANT
KIT TURNOVER KIT B (KITS) ×2 IMPLANT
MANIFOLD NEPTUNE II (INSTRUMENTS) ×2 IMPLANT
PACK CYSTO (CUSTOM PROCEDURE TRAY) ×2 IMPLANT
STENT URET 6FRX26 CONTOUR (STENTS) IMPLANT
TOWEL GREEN STERILE FF (TOWEL DISPOSABLE) ×2 IMPLANT
TUBE CONNECTING 12X1/4 (SUCTIONS) IMPLANT

## 2022-12-28 NOTE — ED Notes (Signed)
ED TO ED transport, [facility] is in route

## 2022-12-28 NOTE — ED Triage Notes (Signed)
C/o n/v, AMS, cough, headache, fever with t.max 103 at home x3 days

## 2022-12-28 NOTE — Anesthesia Preprocedure Evaluation (Addendum)
Anesthesia Evaluation  Patient identified by MRN, date of birth, ID band Patient awake    Reviewed: Allergy & Precautions, NPO status , Patient's Chart, lab work & pertinent test results  History of Anesthesia Complications Negative for: history of anesthetic complications  Airway Mallampati: III  TM Distance: >3 FB Neck ROM: Full    Dental  (+) Edentulous Upper, Edentulous Lower, Dental Advisory Given   Pulmonary shortness of breath, former smoker    + decreased breath sounds+ wheezing      Cardiovascular hypertension, Pt. on medications (-) angina (-) Past MI and (-) CHF  Rhythm:Regular     Neuro/Psych negative neurological ROS  negative psych ROS   GI/Hepatic   Endo/Other  Lab Results      Component                Value               Date                      HGBA1C                                       08/27/2006            6.1 (NOTE)   The ADA recommends the following therapeutic goals for glycemic   control related to Hgb A1C measurement:   Goal of Therapy:   < 7.0% Hgb A1C   Action Suggested:  > 8.0% Hgb A1C   Ref:  Diabetes Care, 22, Suppl. 1, 1999   Renal/GU ARFRenal diseaseLab Results      Component                Value               Date                      NA                       135                 12/28/2022                K                        4.1                 12/28/2022                CO2                      16 (L)              12/28/2022                GLUCOSE                  116 (H)             12/28/2022                BUN                      37 (H)  12/28/2022                CREATININE               2.62 (H)            12/28/2022                CALCIUM                  8.7 (L)             12/28/2022                EGFR                     58 (L)              06/29/2022                GFRNONAA                 24 (L)              12/28/2022             urospesis      Musculoskeletal  (+) Arthritis ,    Abdominal   Peds  Hematology Lab Results      Component                Value               Date                      WBC                      22.9 (H)            12/28/2022                HGB                      12.8 (L)            12/28/2022                HCT                      39.5                12/28/2022                MCV                      93.8                12/28/2022                PLT                      244                 12/28/2022              Anesthesia Other Findings   Reproductive/Obstetrics                             Anesthesia Physical Anesthesia Plan  ASA: 3 and emergent  Anesthesia Plan: General   Post-op Pain Management:  Minimal or no pain anticipated   Induction: Intravenous, Rapid sequence and Cricoid pressure planned  PONV Risk Score and Plan: 2 and Ondansetron and Dexamethasone  Airway Management Planned: Oral ETT  Additional Equipment: None  Intra-op Plan:   Post-operative Plan: Extubation in OR  Informed Consent: I have reviewed the patients History and Physical, chart, labs and discussed the procedure including the risks, benefits and alternatives for the proposed anesthesia with the patient or authorized representative who has indicated his/her understanding and acceptance.     Dental advisory given  Plan Discussed with: CRNA  Anesthesia Plan Comments:         Anesthesia Quick Evaluation

## 2022-12-28 NOTE — ED Provider Notes (Signed)
[place] EMERGENCY DEPARTMENT AT [facility] Provider Note  CSN: [id number] Arrival date & time: 12/28/22 1052  Chief Complaint(s) Vomiting and Fever  HPI Marcus Simpson is a 77 y.o. male history of hypertension presenting to the emergency department with fevers.  Patient reports 1 week of fevers and chills, associated with nausea and vomiting, nonproductive cough.  Also reports some right lower chest and right upper abdominal pain.  No back pain.  No hematemesis.  No diarrhea.  No sick contacts.  He also reports weakness, lightheadedness.  He discussed with his primary doctor and received course of azithromycin without improvement.  He was also apparently prescribed Paxlovid but did not test positive for COVID.   Past Medical History Past Medical History:  Diagnosis Date   Arthritis    Bradycardia    asymptomatic    History of kidney stones    Hypertension    Prostate cancer [facility])    Patient Active Problem List   Diagnosis Date Noted   OA (osteoarthritis) of knee 05/02/2017   Diarrhea 03/10/2015   Prostate cancer (HCC) 03/10/2015   HYPERLIPIDEMIA-MIXED 11/13/2008   Essential hypertension 11/13/2008   SYNCOPE AND COLLAPSE 11/13/2008   Home Medication(s) Prior to Admission medications   Medication Sig Start Date End Date Taking? Authorizing Provider  allopurinol (ZYLOPRIM) 100 MG tablet Take 100 mg by mouth daily. Patient not taking: Reported on 06/29/2022    [provider]  aspirin 81 MG chewable tablet Chew by mouth daily.    [provider]  cetirizine (ZYRTEC) 10 MG tablet Take 10 mg by mouth at bedtime. Patient not taking: Reported on 06/29/2022    [provider]  clobetasol cream (TEMOVATE) 0.05 % Apply 1 application. topically 2 (two) times daily. Patient not taking: Reported on 06/29/2022 05/23/21   [provider]  clotrimazole-betamethasone (LOTRISONE) cream APPLY TO THE AFFECTED AREA(S) TOPICALLY TWICE DAILY 08/26/22    Marcine Matar, MD  cyclobenzaprine (FLEXERIL) 10 MG tablet Take 1 tablet (10 mg total) by mouth 3 (three) times daily as needed for muscle spasms. Patient not taking: Reported on 06/29/2022 06/28/21   Bethann Berkshire, MD  diazepam (VALIUM) 5 MG tablet Take 2 tabs 1 hr before MRI 06/29/22   Marcine Matar, MD  gabapentin (NEURONTIN) 400 MG capsule Take 400 mg by mouth 2 (two) times daily. Patient not taking: Reported on 06/29/2022 06/25/21   [provider]  Gluc-Chonn-MSM-Boswellia-Vit D (GLUCOSAMINE CHOND TRIPLE/VIT D PO) Take 2 tablets by mouth daily. Patient not taking: Reported on 06/29/2022    [provider]  indomethacin (INDOCIN) 25 MG capsule Take 25 mg by mouth 3 (three) times daily. 05/20/21   [provider]  loperamide (IMODIUM) 2 MG capsule Take 1 capsule (2 mg total) by mouth 2 (two) times daily. Patient not taking: Reported on 06/29/2022 04/02/15   Malissa Hippo, MD  meclizine (ANTIVERT) 25 MG tablet Take 25 mg by mouth 3 (three) times daily as needed for dizziness. Take 1/2 to 1 tablet 3 times a day as needed for Dizziness Patient not taking: Reported on 06/29/2022    [provider]  Melatonin 5 MG CAPS Take by mouth.    [provider]  methocarbamol (ROBAXIN) 500 MG tablet Take 1 tablet (500 mg total) by mouth every 6 (six) hours as needed for muscle spasms. Patient not taking: Reported on 06/29/2022 05/03/17   Julien Girt, Alexzandrew L, PA-C  metoprolol succinate (TOPROL-XL) 25 MG 24 hr tablet Take 25 mg by mouth  2 (two) times daily. 09/01/20   [provider]  Multiple Vitamin (MULTIVITAMIN) tablet Take 1 tablet by mouth daily.    [provider]  oxyCODONE (OXY IR/ROXICODONE) 5 MG immediate release tablet Take 1-2 tablets (5-10 mg total) by mouth every 4 (four) hours as needed for moderate pain or severe pain. Patient not taking: Reported on 06/29/2022 05/03/17   Julien Girt, Alexzandrew L, PA-C  oxyCODONE-acetaminophen (PERCOCET)  5-325 MG tablet Take 1 tablet by mouth every 6 (six) hours as needed. Patient not taking: Reported on 06/29/2022 06/28/21   Bethann Berkshire, MD  rivaroxaban (XARELTO) 10 MG TABS tablet Take 1 tablet (10 mg total) by mouth daily with breakfast. Take Xarelto for two and a half more weeks following discharge from the hospital, then discontinue Xarelto. Once the patient has completed the Xarelto, they may resume the 81 mg Aspirin. Patient not taking: Reported on 06/29/2022 05/04/17   Julien Girt, Alexzandrew L, PA-C  rosuvastatin (CRESTOR) 5 MG tablet Take 5 mg by mouth daily.    [provider]  tamsulosin (FLOMAX) 0.4 MG CAPS capsule Take 1 capsule (0.4 mg total) by mouth daily after supper. Patient not taking: Reported on 06/29/2022 11/04/20   Marcine Matar, MD  triamcinolone cream (KENALOG) 0.1 % Apply 1 application topically 2 (two) times daily. Patient not taking: Reported on 06/29/2022 05/24/21   Eber Hong, MD  valACYclovir (VALTREX) 1000 MG tablet Take 1 tablet (1,000 mg total) by mouth 3 (three) times daily. Patient not taking: Reported on 06/29/2022 05/24/21   Eber Hong, MD                                                                                                                                    Past Surgical History Past Surgical History:  Procedure Laterality Date   COLONOSCOPY     FLEXIBLE SIGMOIDOSCOPY N/A 04/02/2015   Procedure: FLEXIBLE SIGMOIDOSCOPY;  Surgeon: Malissa Hippo, MD;  Location: [facility];  Service: Endoscopy;  Laterality: N/A;  1:45 - moved to 1/11 @ 8:30 - Ann to notify   KIDNEY STONE SURGERY     TOTAL KNEE ARTHROPLASTY Left 05/02/2017   Procedure: LEFT TOTAL KNEE ARTHROPLASTY;  Surgeon: Ollen Gross, MD;  Location: [facility];  Service: Orthopedics;  Laterality: Left;   Family History History reviewed. No pertinent family history.  Social History Social History   Tobacco Use   Smoking status: Former    Types: Cigarettes   Smokeless tobacco: Never    Tobacco comments:    quit at age 60   Vaping Use   Vaping status: Never Used  Substance Use Topics   Alcohol use: No    Alcohol/week: 0.0 standard drinks of alcohol   Drug use: No   Allergies Dilaudid [hydromorphone hcl] and Hydromorphone  Review of Systems Review of Systems  All other systems reviewed and are negative.   Physical Exam Vital Signs  I have reviewed the  triage vital signs BP 133/63   Pulse 86   Temp 98.2 F (36.8 C)   Resp 20   Wt 97.5 kg   SpO2 94%   BMI 29.99 kg/m  Physical Exam Vitals and nursing note reviewed.  Constitutional:      General: He is not in acute distress.    Appearance: Normal appearance. He is ill-appearing.  HENT:     Mouth/Throat:     Mouth: Mucous membranes are dry.  Eyes:     Conjunctiva/sclera: Conjunctivae normal.  Cardiovascular:     Rate and Rhythm: Regular rhythm. Tachycardia present.  Pulmonary:     Effort: No respiratory distress.     Breath sounds: Rales (RLL) present.  Abdominal:     General: Abdomen is flat.     Palpations: Abdomen is soft.     Tenderness: There is no abdominal tenderness. There is no right CVA tenderness or left CVA tenderness.  Musculoskeletal:     Cervical back: Neck supple. No rigidity.     Right lower leg: No edema.     Left lower leg: No edema.  Skin:    General: Skin is warm and dry.     Capillary Refill: Capillary refill takes less than 2 seconds.  Neurological:     Mental Status: He is alert and oriented to person, place, and time. Mental status is at baseline.  Psychiatric:        Mood and Affect: Mood normal.        Behavior: Behavior normal.     ED Results and Treatments Labs (all labs ordered are listed, but only abnormal results are displayed) Labs Reviewed  LACTIC ACID, PLASMA - Abnormal; Notable for the following components:      Result Value   Lactic Acid, Venous 2.9 (*)    All other components within normal limits  LACTIC ACID, PLASMA - Abnormal; Notable for the  following components:   Lactic Acid, Venous 3.1 (*)    All other components within normal limits  COMPREHENSIVE METABOLIC PANEL - Abnormal; Notable for the following components:   CO2 16 (*)    Glucose, Bld 116 (*)    BUN 37 (*)    Creatinine, Ser 2.62 (*)    Calcium 8.7 (*)    Total Protein 8.2 (*)    Albumin 3.3 (*)    Alkaline Phosphatase 170 (*)    Total Bilirubin 2.1 (*)    GFR, Estimated 24 (*)    All other components within normal limits  CBC WITH DIFFERENTIAL/PLATELET - Abnormal; Notable for the following components:   WBC 22.9 (*)    RBC 4.21 (*)    Hemoglobin 12.8 (*)    Neutro Abs 21.0 (*)    Abs Immature Granulocytes 0.31 (*)    All other components within normal limits  URINALYSIS, W/ REFLEX TO CULTURE (INFECTION SUSPECTED) - Abnormal; Notable for the following components:   Color, Urine AMBER (*)    APPearance HAZY (*)    Hgb urine dipstick SMALL (*)    Protein, ur 100 (*)    Leukocytes,Ua SMALL (*)    Bacteria, UA RARE (*)    All other components within normal limits  RESP PANEL BY RT-PCR (RSV, FLU A&B, COVID)  RVPGX2  CULTURE, BLOOD (ROUTINE X 2)  CULTURE, BLOOD (ROUTINE X 2)  URINE CULTURE  CREATININE, SERUM  LACTIC ACID, PLASMA  Radiology CT CHEST ABDOMEN PELVIS WO CONTRAST  Result Date: 12/28/2022 CLINICAL DATA:  Sepsis, fever and altered mental status. EXAM: CT CHEST, ABDOMEN AND PELVIS WITHOUT CONTRAST TECHNIQUE: Multidetector CT imaging of the chest, abdomen and pelvis was performed following the standard protocol without IV contrast. RADIATION DOSE REDUCTION: This exam was performed according to the departmental dose-optimization program which includes automated exposure control, adjustment of the mA and/or kV according to patient size and/or use of iterative reconstruction technique. COMPARISON:  09/03/2020 FINDINGS: CT CHEST FINDINGS  Cardiovascular: The heart size is within normal limits. No pericardial fluid is identified. Top-normal heart size. No visualized calcified coronary artery plaque. Normal caliber thoracic aorta demonstrating mild atherosclerosis. Central pulmonary arteries are normal in caliber. Mediastinum/Nodes: No enlarged mediastinal, hilar, or axillary lymph nodes. Thyroid gland, trachea, and esophagus demonstrate no significant findings. Lungs/Pleura: Scattered bilateral scarring and atelectasis without overt focal airspace disease, pulmonary edema, pneumothorax, mass or pleural fluid. Musculoskeletal: No chest wall mass or suspicious bone lesions identified. CT ABDOMEN PELVIS FINDINGS Hepatobiliary: Evidence of hepatic steatosis. The gallbladder is unremarkable. No biliary ductal dilatation. Pancreas: Unremarkable. No pancreatic ductal dilatation or surrounding inflammatory changes. Spleen: Normal in size without focal abnormality. Adrenals/Urinary Tract: No adrenal masses. Multiple bilateral renal calculi again noted. There is evidence of moderate right-sided hydronephrosis secondary to an obstructing calculi in the mid to distal ureter at the level of the upper sacrum. There are at least 2-3 calculi on top of one another with the largest measuring up to 10 mm in diameter on axial images. Conglomerate total height of calculi within the ureter is approximately 2 cm on the sagittal reconstructions. No left-sided hydronephrosis. The bladder is unremarkable. Stomach/Bowel: Bowel shows no evidence of obstruction, ileus, inflammation or lesion. The appendix is not discretely visualized. No free intraperitoneal air. Vascular/Lymphatic: No significant vascular findings are present. No enlarged abdominal or pelvic lymph nodes. Reproductive: Prostate is unremarkable. Other: Small left inguinal hernia contains fat. No ascites or focal abscess identified. Musculoskeletal: Stable osteopenic compression deformity of the L1 vertebral body.  IMPRESSION: 1. Moderate right-sided hydronephrosis secondary to obstructing calculi in the mid to distal right ureter at the level of the upper sacrum. There are at least 2-3 calculi on top of one another with the largest measuring up to 10 mm in diameter on axial images. Conglomerate total height of calculi is approximately 2 cm on the sagittal reconstructions. 2. Hepatic steatosis. 3. Small left inguinal hernia contains fat. 4. Stable osteopenic compression deformity of the L1 vertebral body. Electronically Signed   By: Irish Lack M.D.   On: 12/28/2022 18:08   DG Chest 2 View  Result Date: 12/28/2022 CLINICAL DATA:  Cough, fever EXAM: CHEST - 2 VIEW COMPARISON:  08/27/2006 FINDINGS: Coarse perihilar interstitial markings have increased since previous. No confluent airspace disease. Heart size and mediastinal contours are within normal limits. No effusion. Stable vertebral compression deformity near the thoracolumbar junction. IMPRESSION: Increased perihilar interstitial markings suggesting bronchitis. No focal infiltrate. Electronically Signed   By: Corlis Leak M.D.   On: 12/28/2022 14:23    Pertinent labs & imaging results that were available during my care of the patient were reviewed by me and considered in my medical decision making (see MDM for details).  Medications Ordered in ED Medications  lactated ringers infusion (has no administration in time range)  vancomycin (VANCOREADY) IVPB 2000 mg/400 mL (2,000 mg Intravenous New Bag/Given 12/28/22 1756)  ceFEPIme (MAXIPIME) 2 g in sodium chloride 0.9 % 100 mL IVPB (has  no administration in time range)  vancomycin (VANCOREADY) IVPB 1500 mg/300 mL (has no administration in time range)  acetaminophen (TYLENOL) tablet 650 mg (650 mg Oral Given 12/28/22 1234)  ceFEPIme (MAXIPIME) 2 g in sodium chloride 0.9 % 100 mL IVPB (0 g Intravenous Stopped 12/28/22 1657)  metroNIDAZOLE (FLAGYL) IVPB 500 mg (0 mg Intravenous Stopped 12/28/22 1756)  lactated  ringers bolus 2,925 mL (2,925 mLs Intravenous New Bag/Given 12/28/22 1629)                                                                                                                                     Procedures .Critical Care  Performed by: Lonell Grandchild, MD Authorized by: Lonell Grandchild, MD   Critical care provider statement:    Critical care time (minutes):  30   Critical care time was exclusive of:  Separately billable procedures and treating other patients   Critical care was necessary to treat or prevent imminent or life-threatening deterioration of the following conditions:  Sepsis and renal failure   Critical care was time spent personally by me on the following activities:  Development of treatment plan with patient or surrogate, discussions with consultants, evaluation of patient's response to treatment, examination of patient, ordering and review of laboratory studies, ordering and review of radiographic studies, ordering and performing treatments and interventions, pulse oximetry, re-evaluation of patient's condition and review of old charts   (including critical care time)  Medical Decision Making / ED Course   MDM:  77 year old male presenting to the emergency department with fevers and chills.  Patient mildly ill-appearing, appears mildly dehydrated.  Unclear cause of symptoms but suspect underlying infection.  Labs notable for significant AKI and leukocytosis.  Patient also with elevated lactic acid.  Will give IV fluids, obtain blood cultures and treat with antibiotics.  He has some right-sided pain, does have some trace crackles in the right lower lobe although chest x-ray read as clear, will obtain CT chest.  He also has some vomiting and some right upper abdominal pain although no significant tenderness, will check CT abdomen as well to further evaluate.  Pending urinalysis.  No CVA tenderness on exam to suggest pyelonephritis but does report subjective  right sided pain.  No rashes to suggest cellulitis.  No meningismus to suggest meningitis or CNS infection.  Patient will need admission.  Clinical Course as of 12/28/22 1904  Tue Dec 28, 2022  1533 Lactic Acid, Venous(!!): 3.1 Obtained prior to receiving fluids  [WS]  1900 CT scan shows multiple right-sided ureteral calculi with hydronephrosis.  Discussed with Dr. Alvester Morin of urology.  He requested the patient be sent to [facility], ER for further evaluation.  Patient accepted by Dr. Andria Meuse.  Suspect this is the cause of the patient's fever, leukocytosis, lactic acidosis.  His urinalysis does have bacteria, leukocytes.  He has received antibiotics.  Urine culture and blood cultures have been sent.  Patient will be transferred via ambulance for definitive treatment. [WS]    Clinical Course User Index [WS] Lonell Grandchild, MD     Additional history obtained: -Additional history obtained from spouse -External records from outside source obtained and reviewed including: Chart review including previous notes, labs, imaging, consultation notes including prior urology notes    Lab Tests: -I ordered, reviewed, and interpreted labs.   The pertinent results include:   Labs Reviewed  LACTIC ACID, PLASMA - Abnormal; Notable for the following components:      Result Value   Lactic Acid, Venous 2.9 (*)    All other components within normal limits  LACTIC ACID, PLASMA - Abnormal; Notable for the following components:   Lactic Acid, Venous 3.1 (*)    All other components within normal limits  COMPREHENSIVE METABOLIC PANEL - Abnormal; Notable for the following components:   CO2 16 (*)    Glucose, Bld 116 (*)    BUN 37 (*)    Creatinine, Ser 2.62 (*)    Calcium 8.7 (*)    Total Protein 8.2 (*)    Albumin 3.3 (*)    Alkaline Phosphatase 170 (*)    Total Bilirubin 2.1 (*)    GFR, Estimated 24 (*)    All other components within normal limits  CBC WITH DIFFERENTIAL/PLATELET - Abnormal; Notable  for the following components:   WBC 22.9 (*)    RBC 4.21 (*)    Hemoglobin 12.8 (*)    Neutro Abs 21.0 (*)    Abs Immature Granulocytes 0.31 (*)    All other components within normal limits  URINALYSIS, W/ REFLEX TO CULTURE (INFECTION SUSPECTED) - Abnormal; Notable for the following components:   Color, Urine AMBER (*)    APPearance HAZY (*)    Hgb urine dipstick SMALL (*)    Protein, ur 100 (*)    Leukocytes,Ua SMALL (*)    Bacteria, UA RARE (*)    All other components within normal limits  RESP PANEL BY RT-PCR (RSV, FLU A&B, COVID)  RVPGX2  CULTURE, BLOOD (ROUTINE X 2)  CULTURE, BLOOD (ROUTINE X 2)  URINE CULTURE  CREATININE, SERUM  LACTIC ACID, PLASMA    Notable for leukocytosis, lactic acidosis, AKI   EKG   EKG Interpretation Date/Time:  Tuesday December 28 2022 12:32:37 EDT Ventricular Rate:  106 PR Interval:  137 QRS Duration:  83 QT Interval:  327 QTC Calculation: 435 R Axis:   16  Text Interpretation: Sinus tachycardia Abnormal R-wave progression, early transition No significant change since last tracing Confirmed by Alvino Blood ([id number]) on 12/28/2022 2:52:25 PM         Imaging Studies ordered: I ordered imaging studies including CT c/a/p On my interpretation imaging demonstrates obstructing ureterolithiasis  I independently visualized and interpreted imaging. I agree with the radiologist interpretation   Medicines ordered and prescription drug management: Meds ordered this encounter  Medications   acetaminophen (TYLENOL) tablet 650 mg   lactated ringers infusion   ceFEPIme (MAXIPIME) 2 g in sodium chloride 0.9 % 100 mL IVPB    Order Specific Question:   Antibiotic Indication:    Answer:   Other Indication (list below)    Order Specific Question:   Other Indication:    Answer:   Unknown source   metroNIDAZOLE (FLAGYL) IVPB 500 mg    Order Specific Question:   Antibiotic Indication:    Answer:   Other Indication (list below)    Order Specific  Question:   Other Indication:  Answer:   Unknown source   DISCONTD: vancomycin (VANCOCIN) IVPB 1000 mg/200 mL premix    Order Specific Question:   Indication:    Answer:   Other Indication (list below)    Order Specific Question:   Other Indication:    Answer:   Unknown source   lactated ringers bolus 2,925 mL   vancomycin (VANCOREADY) IVPB 2000 mg/400 mL    Order Specific Question:   Indication:    Answer:   Other Indication (list below)    Order Specific Question:   Other Indication:    Answer:   Unknown source   ceFEPIme (MAXIPIME) 2 g in sodium chloride 0.9 % 100 mL IVPB    Order Specific Question:   Antibiotic Indication:    Answer:   Other Indication (list below)    Order Specific Question:   Other Indication:    Answer:   unknown source   vancomycin (VANCOREADY) IVPB 1500 mg/300 mL    Order Specific Question:   Indication:    Answer:   Other Indication (list below)    Order Specific Question:   Other Indication:    Answer:   unknown source    -I have reviewed the patients home medicines and have made adjustments as needed   Consultations Obtained: I requested consultation with the urologist,  and discussed lab and imaging findings as well as pertinent plan - they recommend: ER to ER transfer for stenting or nephrostomy placement   Cardiac Monitoring: The patient was maintained on a cardiac monitor.  I personally viewed and interpreted the cardiac monitored which showed an underlying rhythm of: sinus tachycardia   Social Determinants of Health:  Diagnosis or treatment significantly limited by social determinants of health: obesity   Reevaluation: After the interventions noted above, I reevaluated the patient and found that their symptoms have improved  Co morbidities that complicate the patient evaluation  Past Medical History:  Diagnosis Date   Arthritis    Bradycardia    asymptomatic    History of kidney stones    Hypertension    Prostate cancer (HCC)        Dispostion: Disposition decision including need for hospitalization was considered, and patient transferred.    Final Clinical Impression(s) / ED Diagnoses Final diagnoses:  Sepsis secondary to UTI [facility])  Hydronephrosis with obstructing calculus     This chart was dictated using voice recognition software.  Despite best efforts to proofread,  errors can occur which can change the documentation meaning.    Lonell Grandchild, MD 12/28/22 [id number]

## 2022-12-28 NOTE — ED Provider Notes (Signed)
Patient transferred to the ED at [facility] to be evaluated by urology and have urologic stenting.  Patient being treated for sepsis associated with infected ureteral stone.  Patient's vitals are stable.  Blood pressure 133/63.   Urology is here.  Plan is to OR.  I will consult with intensivist regarding admission.  CAse discussed with Dr Vassie Loll.  BP is stable.  SUspects lactic acid should approve after intervention.  Recommend hospitalist admission  Case discussed with Dr Burman Freestone regarding admission   Linwood Dibbles, MD 12/28/22 2151

## 2022-12-28 NOTE — Progress Notes (Signed)
Pharmacy Antibiotic Note  Marcus Simpson is a 77 y.o. male admitted on 12/28/2022 with  unknown source of infection .  Pharmacy has been consulted for vancomycin and cefepime dosing.  Plan: Vancomycin 200 mg IV x 1 dose. Vancomycin 1500 mg IV every 48 hours. Cefepime 2000 mg IV every 24 hours. Monitor labs, c/s, and vanco levels as indicated  Weight: 97.5 kg (215 lb)  Temp (24hrs), Avg:99.3 F (37.4 C), Min:98.2 F (36.8 C), Max:100.4 F (38 C)  Recent Labs  Lab 12/28/22 1249 12/28/22 1429  WBC 22.9*  --   CREATININE 2.62*  --   LATICACIDVEN 2.9* 3.1*    CrCl cannot be calculated (Unknown ideal weight.).    Allergies  Allergen Reactions   Dilaudid [Hydromorphone Hcl] Anaphylaxis   Hydromorphone Other (See Comments)    Other reaction(s): Irregular heart rate    Antimicrobials this admission: Vanco 10/8 >> Cefepime 10/8 >> Flagyl 10/8  Microbiology results: 10/8 BCx: pending   Thank you for allowing pharmacy to be a part of this patient's care.  Judeth Cornfield, PharmD Clinical Pharmacist 12/28/2022 4:35 PM

## 2022-12-28 NOTE — ED Notes (Signed)
Urinal given to pt to try to urinate.

## 2022-12-28 NOTE — Transfer of Care (Signed)
Immediate Anesthesia Transfer of Care Note  Patient: Marcus Simpson  Procedure(s) Performed: CYSTOSCOPY WITH STENT PLACEMENT (Right)  Patient Location: PACU  Anesthesia Type:General  Level of Consciousness: awake, alert , oriented, and drowsy  Airway & Oxygen Therapy: Patient Spontanous Breathing and Patient connected to nasal cannula oxygen  Post-op Assessment: Report given to RN and Post -op Vital signs reviewed and stable  Post vital signs: Reviewed and stable  Last Vitals:  Vitals Value Taken Time  BP 146/54 12/28/22 2338  Temp 36.8 C 12/28/22 2338  Pulse 95 12/28/22 2340  Resp 27 12/28/22 2340  SpO2 93 % 12/28/22 2340  Vitals shown include unfiled device data.  Last Pain:  Vitals:   12/28/22 2037  TempSrc: Oral  PainSc:          Complications: No notable events documented.

## 2022-12-28 NOTE — Sepsis Progress Note (Signed)
Sepsis protocol monitored by [facility] ?

## 2022-12-28 NOTE — Anesthesia Procedure Notes (Signed)
Procedure Name: Intubation Date/Time: 12/28/2022 11:04 PM  Performed by: Laruth Bouchard., CRNAPre-anesthesia Checklist: Patient identified, Emergency Drugs available, Suction available, Patient being monitored and Timeout performed Patient Re-evaluated:Patient Re-evaluated prior to induction Oxygen Delivery Method: Circle system utilized Preoxygenation: Pre-oxygenation with 100% oxygen Induction Type: IV induction, Rapid sequence and Cricoid Pressure applied Laryngoscope Size: Mac and 4 Grade View: Grade I Tube type: Oral Tube size: 7.5 mm Number of attempts: 1 Airway Equipment and Method: Stylet Placement Confirmation: ETT inserted through vocal cords under direct vision, positive ETCO2 and breath sounds checked- equal and bilateral Secured at: 23 cm Tube secured with: Tape Dental Injury: Teeth and Oropharynx as per pre-operative assessment

## 2022-12-28 NOTE — Consult Note (Addendum)
Urology Consult Note   Requesting Attending Physician:  Lonell Grandchild, MD Service Providing Consult: Urology   Reason for Consult:  Nephrolithiasis  HPI: Marcus Simpson is seen in consultation for reasons noted above at the request of Lonell Grandchild, MD for evaluation of septic stone.  77yM with a history of HTN, prostate cancer, nephrolithiasis who Initially presented to [facility] ED with fevers and 1 week of fever/schills/nausea/emesis, as well as cough and RUQ/right chest pain. He was given azithromycin by his primary care doctor within the past week.   In the ED he was febrile to 100.4, tachycardic to 110s, and hypertensive. Labs notable for lactate of 2.9, cr 2.62 from baseline 1.26, leukocytosis to 22.9. UA with negative nitrite, 11-20 WBC, 21-50 RBC, rare bacteria. A urine culture and blood cultures are pending.   CT scan obtained showing multiple mid-distal right ureteral calculi measuring up to 1cm with associated moderate right hydroureteronephrosis. He was transferred to [facility] for operative intervention.   He received vancomycin and cefepime for abx.   He has a history of kidney stones, he reports prior lithotripsy.  He was previously on xarelto, no longer taking. Does take ASA 81mg .  Patient has previously followed with Dr. Retta Diones for prostate cancer. 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 He was last seen in April 2024 with plans to undergo PSA and prostate MRI with eventual repeat biopsy. PSA was 6.1 (from 6.1 in 2020), MRI not completed.    Past Medical History: Past Medical History:  Diagnosis Date   Arthritis    Bradycardia    asymptomatic    History of kidney stones    Hypertension    Prostate cancer [facility]     Past Surgical History:  Past Surgical History:  Procedure Laterality Date   COLONOSCOPY     FLEXIBLE SIGMOIDOSCOPY N/A 04/02/2015   Procedure: FLEXIBLE SIGMOIDOSCOPY;  Surgeon: Malissa Hippo, MD;  Location: [facility] ENDO SUITE;   Service: Endoscopy;  Laterality: N/A;  1:45 - moved to 1/11 @ 8:30 - Ann to notify   KIDNEY STONE SURGERY     TOTAL KNEE ARTHROPLASTY Left 05/02/2017   Procedure: LEFT TOTAL KNEE ARTHROPLASTY;  Surgeon: Ollen Gross, MD;  Location: [facility] ORS;  Service: Orthopedics;  Laterality: Left;    Medication: Current Facility-Administered Medications  Medication Dose Route Frequency Provider Last Rate Last Admin   [START ON 12/29/2022] ceFEPIme (MAXIPIME) 2 g in sodium chloride 0.9 % 100 mL IVPB  2 g Intravenous Q24H Lonell Grandchild, MD       lactated ringers infusion   Intravenous Continuous Lonell Grandchild, MD 150 mL/hr at 12/28/22 1919 Restarted at 12/28/22 1919   [START ON 12/30/2022] vancomycin (VANCOREADY) IVPB 1500 mg/300 mL  1,500 mg Intravenous Q48H Lonell Grandchild, MD       vancomycin (VANCOREADY) IVPB 2000 mg/400 mL  2,000 mg Intravenous Once Lonell Grandchild, MD 200 mL/hr at 12/28/22 1756 2,000 mg at 12/28/22 1756   Current Outpatient Medications  Medication Sig Dispense Refill   allopurinol (ZYLOPRIM) 100 MG tablet Take 100 mg by mouth daily. (Patient not taking: Reported on 06/29/2022)     aspirin 81 MG chewable tablet Chew by mouth daily.     cetirizine (ZYRTEC) 10 MG tablet Take 10 mg by mouth at bedtime. (Patient not taking: Reported on 06/29/2022)     clobetasol cream (TEMOVATE) 0.05 % Apply 1 application. topically 2 (two) times daily. (Patient not taking: Reported on 06/29/2022)  clotrimazole-betamethasone (LOTRISONE) cream APPLY TO THE AFFECTED AREA(S) TOPICALLY TWICE DAILY 30 g 0   cyclobenzaprine (FLEXERIL) 10 MG tablet Take 1 tablet (10 mg total) by mouth 3 (three) times daily as needed for muscle spasms. (Patient not taking: Reported on 06/29/2022) 20 tablet 0   diazepam (VALIUM) 5 MG tablet Take 2 tabs 1 hr before MRI 2 tablet 0   gabapentin (NEURONTIN) 400 MG capsule Take 400 mg by mouth 2 (two) times daily. (Patient not taking: Reported on 06/29/2022)      Gluc-Chonn-MSM-Boswellia-Vit D (GLUCOSAMINE CHOND TRIPLE/VIT D PO) Take 2 tablets by mouth daily. (Patient not taking: Reported on 06/29/2022)     indomethacin (INDOCIN) 25 MG capsule Take 25 mg by mouth 3 (three) times daily.     loperamide (IMODIUM) 2 MG capsule Take 1 capsule (2 mg total) by mouth 2 (two) times daily. (Patient not taking: Reported on 06/29/2022) 30 capsule 0   meclizine (ANTIVERT) 25 MG tablet Take 25 mg by mouth 3 (three) times daily as needed for dizziness. Take 1/2 to 1 tablet 3 times a day as needed for Dizziness (Patient not taking: Reported on 06/29/2022)     Melatonin 5 MG CAPS Take by mouth.     methocarbamol (ROBAXIN) 500 MG tablet Take 1 tablet (500 mg total) by mouth every 6 (six) hours as needed for muscle spasms. (Patient not taking: Reported on 06/29/2022) 80 tablet 0   metoprolol succinate (TOPROL-XL) 25 MG 24 hr tablet Take 25 mg by mouth 2 (two) times daily.     Multiple Vitamin (MULTIVITAMIN) tablet Take 1 tablet by mouth daily.     oxyCODONE (OXY IR/ROXICODONE) 5 MG immediate release tablet Take 1-2 tablets (5-10 mg total) by mouth every 4 (four) hours as needed for moderate pain or severe pain. (Patient not taking: Reported on 06/29/2022) 80 tablet 0   oxyCODONE-acetaminophen (PERCOCET) 5-325 MG tablet Take 1 tablet by mouth every 6 (six) hours as needed. (Patient not taking: Reported on 06/29/2022) 15 tablet 0   rivaroxaban (XARELTO) 10 MG TABS tablet Take 1 tablet (10 mg total) by mouth daily with breakfast. Take Xarelto for two and a half more weeks following discharge from the hospital, then discontinue Xarelto. Once the patient has completed the Xarelto, they may resume the 81 mg Aspirin. (Patient not taking: Reported on 06/29/2022) 19 tablet 0   rosuvastatin (CRESTOR) 5 MG tablet Take 5 mg by mouth daily.     tamsulosin (FLOMAX) 0.4 MG CAPS capsule Take 1 capsule (0.4 mg total) by mouth daily after supper. (Patient not taking: Reported on 06/29/2022) 90 capsule 11    triamcinolone cream (KENALOG) 0.1 % Apply 1 application topically 2 (two) times daily. (Patient not taking: Reported on 06/29/2022) 30 g 0   valACYclovir (VALTREX) 1000 MG tablet Take 1 tablet (1,000 mg total) by mouth 3 (three) times daily. (Patient not taking: Reported on 06/29/2022) 21 tablet 0    Allergies: Allergies  Allergen Reactions   Dilaudid [Hydromorphone Hcl] Anaphylaxis   Hydromorphone Other (See Comments)    Other reaction(s): Irregular heart rate    Social History: Social History   Tobacco Use   Smoking status: Former    Types: Cigarettes   Smokeless tobacco: Never   Tobacco comments:    quit at age 46   Vaping Use   Vaping status: Never Used  Substance Use Topics   Alcohol use: No    Alcohol/week: 0.0 standard drinks of alcohol   Drug use: No  Family History History reviewed. No pertinent family history.  Review of Systems 10 systems were reviewed and are negative except as noted specifically in the HPI.  Objective   Vital signs in last 24 hours: BP 133/63   Pulse 86   Temp 98.2 F (36.8 C)   Resp 20   Wt 97.5 kg   SpO2 94%   BMI 29.99 kg/m   Physical Exam General: NAD, A&O, tremulous HEENT: [place]/AT, EOMI Pulmonary: Normal work of breathing Cardiovascular: HDS, adequate peripheral perfusion Abdomen: Soft, NTTP, nondistended, . GU: voiding spontaneously, right CVA tenderness Extremities: warm and well perfused Neuro: Appropriate, no focal neurological deficits  Most Recent Labs: Lab Results  Component Value Date   WBC 22.9 (H) 12/28/2022   HGB 12.8 (L) 12/28/2022   HCT 39.5 12/28/2022   PLT 244 12/28/2022    Lab Results  Component Value Date   NA 135 12/28/2022   K 4.1 12/28/2022   CL 106 12/28/2022   CO2 16 (L) 12/28/2022   BUN 37 (H) 12/28/2022   CREATININE 2.62 (H) 12/28/2022   CALCIUM 8.7 (L) 12/28/2022    Lab Results  Component Value Date   INR 1.02 04/27/2017   APTT 30 04/27/2017     Urine  Culture: @[id number] (laburin,org,[id number])@   IMAGING: CT CHEST ABDOMEN PELVIS WO CONTRAST  Result Date: 12/28/2022 CLINICAL DATA:  Sepsis, fever and altered mental status. EXAM: CT CHEST, ABDOMEN AND PELVIS WITHOUT CONTRAST TECHNIQUE: Multidetector CT imaging of the chest, abdomen and pelvis was performed following the standard protocol without IV contrast. RADIATION DOSE REDUCTION: This exam was performed according to the departmental dose-optimization program which includes automated exposure control, adjustment of the mA and/or kV according to patient size and/or use of iterative reconstruction technique. COMPARISON:  09/03/2020 FINDINGS: CT CHEST FINDINGS Cardiovascular: The heart size is within normal limits. No pericardial fluid is identified. Top-normal heart size. No visualized calcified coronary artery plaque. Normal caliber thoracic aorta demonstrating mild atherosclerosis. Central pulmonary arteries are normal in caliber. Mediastinum/Nodes: No enlarged mediastinal, hilar, or axillary lymph nodes. Thyroid gland, trachea, and esophagus demonstrate no significant findings. Lungs/Pleura: Scattered bilateral scarring and atelectasis without overt focal airspace disease, pulmonary edema, pneumothorax, mass or pleural fluid. Musculoskeletal: No chest wall mass or suspicious bone lesions identified. CT ABDOMEN PELVIS FINDINGS Hepatobiliary: Evidence of hepatic steatosis. The gallbladder is unremarkable. No biliary ductal dilatation. Pancreas: Unremarkable. No pancreatic ductal dilatation or surrounding inflammatory changes. Spleen: Normal in size without focal abnormality. Adrenals/Urinary Tract: No adrenal masses. Multiple bilateral renal calculi again noted. There is evidence of moderate right-sided hydronephrosis secondary to an obstructing calculi in the mid to distal ureter at the level of the upper sacrum. There are at least 2-3 calculi on top of one another with the largest measuring up to 10 mm  in diameter on axial images. Conglomerate total height of calculi within the ureter is approximately 2 cm on the sagittal reconstructions. No left-sided hydronephrosis. The bladder is unremarkable. Stomach/Bowel: Bowel shows no evidence of obstruction, ileus, inflammation or lesion. The appendix is not discretely visualized. No free intraperitoneal air. Vascular/Lymphatic: No significant vascular findings are present. No enlarged abdominal or pelvic lymph nodes. Reproductive: Prostate is unremarkable. Other: Small left inguinal hernia contains fat. No ascites or focal abscess identified. Musculoskeletal: Stable osteopenic compression deformity of the L1 vertebral body. IMPRESSION: 1. Moderate right-sided hydronephrosis secondary to obstructing calculi in the mid to distal right ureter at the level of the upper sacrum. There are at least 2-3 calculi on top  of one another with the largest measuring up to 10 mm in diameter on axial images. Conglomerate total height of calculi is approximately 2 cm on the sagittal reconstructions. 2. Hepatic steatosis. 3. Small left inguinal hernia contains fat. 4. Stable osteopenic compression deformity of the L1 vertebral body. Electronically Signed   By: Irish Lack M.D.   On: 12/28/2022 18:08   DG Chest 2 View  Result Date: 12/28/2022 CLINICAL DATA:  Cough, fever EXAM: CHEST - 2 VIEW COMPARISON:  08/27/2006 FINDINGS: Coarse perihilar interstitial markings have increased since previous. No confluent airspace disease. Heart size and mediastinal contours are within normal limits. No effusion. Stable vertebral compression deformity near the thoracolumbar junction. IMPRESSION: Increased perihilar interstitial markings suggesting bronchitis. No focal infiltrate. Electronically Signed   By: Corlis Leak M.D.   On: 12/28/2022 14:23    ------  Assessment:  77 y.o. male with HTN, arthritis, GERD, nephrolithiasis, prostate cancer who presented with septic right ureteral  stone.  We discussed the indications for acute intervention including infected obstruction, bilateral ureteral obstruction or unilateral obstruction of solitary kidney as well as other less urgent indications for decompression which would included intractable pain, N/V, and acute renal injury. We also discussed the possible inability to place a ureteral stent in which case, the next intervention recommended would be a percutaneous nephrostomy tube. Given sepsis is present, plan for emergent right ureteral stent placement.   Recommendations: Plan for emergent  right ureteral stent placement Case posted, timing TBD Keep patient NPO Consent obtained from patient Urine and blood cultures pending Recommend medicine consult for admission, possible ICU-level care pending lactic acid trend  Thank you for this consult. Please contact the urology consult pager with any further questions/concerns.    Patient seen and examined independently of the resident.  Agree with assessment and plan except as noted.  Patient critically ill with sepsis secondary to obstructing right ureteral stone  Will plan for emergent right ureteral stent placement.  Risk benefits discussed.

## 2022-12-28 NOTE — Op Note (Signed)
Operative Note  Preoperative diagnosis:  1.  Right ureteral calculus with UTI secondary to sepsis  Post operative diagnosis: 1.  Right ureteral calculus with UTI secondary to sepsis  Procedure(s): 1.  Cystoscopy with right retrograde pyelogram and right ureteral stent placement  Surgeon: Modena Slater, MD  Assistants: Cathren Harsh, MD  Anesthesia: General  Complications: None immediate  EBL: Minimal  Specimens: 1.  Urine culture  Drains/Catheters: 1.  6 X 26 double-J ureteral stent 2.  Foley catheter  Intraoperative findings: 1.  Narrow urethra I will be navigated with the scope.  Borderline obstructing prostate.  Bladder mucosa with moderate trabeculation.  No tumors or stones.2.  Right retrograde pyelogram revealed a filling defect at the level of the stone with upstream hydroureteronephrosis  Indication: 77 year old male with obstructing right ureteral stone and sepsis presents for urgent stent placement.  Description of procedure:  The patient was identified and consent was obtained.  The patient was taken to the operating room and placed in the supine position.  The patient was placed under general anesthesia.  Perioperative antibiotics were administered.  The patient was placed in dorsal lithotomy.  Patient was prepped and draped in a standard sterile fashion and a timeout was performed.  A 21 French rigid cystoscope was advanced into the urethra and into the bladder.  The right distal most portion of the ureter was cannulated with an open-ended ureteral catheter.  Retrograde pyelogram was performed with the findings noted above.  A sensor wire was then advanced up to the kidney under fluoroscopic guidance.  A 6 X 26 double-J ureteral stent was advanced up to the kidney under fluoroscopic guidance.  The wire was withdrawn and fluoroscopy confirmed good proximal placement and direct visualization confirmed a good coil within the bladder.  There was Efflux of cloudy urine.  This  was collected through the cystoscope for culture.  The bladder was drained and the scope withdrawn.  Foley catheter was placed.  This concluded the operation.  Patient tolerated procedure well and was stable postoperatively.  Plan: Continue IV antibiotics and follow-up on cultures.  Plan for ureteroscopy in a couple of weeks or so once he has recovered.

## 2022-12-28 NOTE — ED Notes (Signed)
Patient to ED via [facility] from [facility] with diagnoses of hydronephrosis and obstructing renal calculi. Patient denies pain at this time and is fully alert and oriented.

## 2022-12-29 ENCOUNTER — Inpatient Hospital Stay (HOSPITAL_COMMUNITY): Payer: Medicare Other

## 2022-12-29 ENCOUNTER — Encounter (HOSPITAL_COMMUNITY): Payer: Self-pay | Admitting: Urology

## 2022-12-29 DIAGNOSIS — N132 Hydronephrosis with renal and ureteral calculous obstruction: Secondary | ICD-10-CM

## 2022-12-29 DIAGNOSIS — N390 Urinary tract infection, site not specified: Secondary | ICD-10-CM

## 2022-12-29 DIAGNOSIS — A419 Sepsis, unspecified organism: Principal | ICD-10-CM

## 2022-12-29 DIAGNOSIS — G9340 Encephalopathy, unspecified: Secondary | ICD-10-CM

## 2022-12-29 LAB — RESPIRATORY PANEL BY PCR

## 2022-12-29 LAB — CBC
HCT: 35.3 % — ABNORMAL LOW (ref 39.0–52.0)
Hemoglobin: 11.8 g/dL — ABNORMAL LOW (ref 13.0–17.0)
MCH: 32.1 pg (ref 26.0–34.0)
MCHC: 33.4 g/dL (ref 30.0–36.0)
MCV: 95.9 fL (ref 80.0–100.0)
Platelets: 254 10*3/uL (ref 150–400)
RBC: 3.68 MIL/uL — ABNORMAL LOW (ref 4.22–5.81)
RDW: 13.3 % (ref 11.5–15.5)
WBC: 36.7 10*3/uL — ABNORMAL HIGH (ref 4.0–10.5)
nRBC: 0 % (ref 0.0–0.2)

## 2022-12-29 LAB — HEMOGLOBIN A1C
Hgb A1c MFr Bld: 6.8 % — ABNORMAL HIGH (ref 4.8–5.6)
Mean Plasma Glucose: 148.46 mg/dL

## 2022-12-29 LAB — BLOOD GAS, ARTERIAL
Acid-base deficit: 10 mmol/L — ABNORMAL HIGH (ref 0.0–2.0)
Bicarbonate: 15.3 mmol/L — ABNORMAL LOW (ref 20.0–28.0)
Drawn by: 252031
O2 Saturation: 96.7 %
Patient temperature: 36.7
pCO2 arterial: 31 mm[Hg] — ABNORMAL LOW (ref 32–48)
pH, Arterial: 7.3 — ABNORMAL LOW (ref 7.35–7.45)
pO2, Arterial: 101 mm[Hg] (ref 83–108)

## 2022-12-29 LAB — BRAIN NATRIURETIC PEPTIDE: B Natriuretic Peptide: 598.8 pg/mL — ABNORMAL HIGH (ref 0.0–100.0)

## 2022-12-29 LAB — GLUCOSE, CAPILLARY
Glucose-Capillary: 159 mg/dL — ABNORMAL HIGH (ref 70–99)
Glucose-Capillary: 213 mg/dL — ABNORMAL HIGH (ref 70–99)
Glucose-Capillary: 156 mg/dL — ABNORMAL HIGH (ref 70–99)

## 2022-12-29 LAB — LACTIC ACID, PLASMA
Lactic Acid, Venous: 3 mmol/L (ref 0.5–1.9)
Lactic Acid, Venous: 3.2 mmol/L (ref 0.5–1.9)

## 2022-12-29 LAB — BASIC METABOLIC PANEL
Anion gap: 15 (ref 5–15)
BUN: 37 mg/dL — ABNORMAL HIGH (ref 8–23)
CO2: 17 mmol/L — ABNORMAL LOW (ref 22–32)
Calcium: 8.5 mg/dL — ABNORMAL LOW (ref 8.9–10.3)
Chloride: 103 mmol/L (ref 98–111)
Creatinine, Ser: 3.05 mg/dL — ABNORMAL HIGH (ref 0.61–1.24)
GFR, Estimated: 20 mL/min — ABNORMAL LOW (ref >60)
Glucose, Bld: 148 mg/dL — ABNORMAL HIGH (ref 70–99)
Potassium: 6.4 mmol/L (ref 3.5–5.1)
Sodium: 135 mmol/L (ref 135–145)

## 2022-12-29 LAB — URINE CULTURE: Culture: NO GROWTH

## 2022-12-29 LAB — MAGNESIUM: Magnesium: 1.5 mg/dL — ABNORMAL LOW (ref 1.7–2.4)

## 2022-12-29 LAB — PHOSPHORUS: Phosphorus: 4.1 mg/dL (ref 2.5–4.6)

## 2022-12-29 LAB — POTASSIUM: Potassium: 5.5 mmol/L — ABNORMAL HIGH (ref 3.5–5.1)

## 2022-12-29 LAB — HEPATIC FUNCTION PANEL
ALT: 24 U/L (ref 0–44)
AST: 28 U/L (ref 15–41)
Albumin: 2.7 g/dL — ABNORMAL LOW (ref 3.5–5.0)
Alkaline Phosphatase: 129 U/L — ABNORMAL HIGH (ref 38–126)
Bilirubin, Direct: 0.5 mg/dL — ABNORMAL HIGH (ref 0.0–0.2)
Indirect Bilirubin: 0.7 mg/dL (ref 0.3–0.9)
Total Bilirubin: 1.2 mg/dL (ref 0.3–1.2)
Total Protein: 7.6 g/dL (ref 6.5–8.1)

## 2022-12-29 LAB — TROPONIN I (HIGH SENSITIVITY)
Troponin I (High Sensitivity): 455 ng/L (ref <18)
Troponin I (High Sensitivity): 313 ng/L (ref <18)

## 2022-12-29 MED ORDER — MAGNESIUM SULFATE 2 GM/50ML IV SOLN
2.0000 g | Freq: Once | INTRAVENOUS | Status: AC
  Administered 2022-12-29: 2 g via INTRAVENOUS
  Filled 2022-12-29: qty 50

## 2022-12-29 MED ORDER — ACETAMINOPHEN 10 MG/ML IV SOLN
INTRAVENOUS | Status: AC
  Filled 2022-12-29: qty 100

## 2022-12-29 MED ORDER — ACETAMINOPHEN 10 MG/ML IV SOLN
1000.0000 mg | Freq: Once | INTRAVENOUS | Status: AC
  Administered 2022-12-29: 1000 mg via INTRAVENOUS

## 2022-12-29 MED ORDER — IPRATROPIUM-ALBUTEROL 0.5-2.5 (3) MG/3ML IN SOLN: 3.0000 mL | Freq: Four times a day (QID) | RESPIRATORY_TRACT | Status: DC | PRN

## 2022-12-29 MED ORDER — TAMSULOSIN HCL 0.4 MG PO CAPS
0.4000 mg | ORAL_CAPSULE | Freq: Every day | ORAL | Status: DC
  Administered 2022-12-29: 0.4 mg via ORAL
  Filled 2022-12-29: qty 1

## 2022-12-29 MED ORDER — FUROSEMIDE 10 MG/ML IJ SOLN
20.0000 mg | Freq: Once | INTRAMUSCULAR | Status: AC
  Administered 2022-12-29: 20 mg via INTRAVENOUS
  Filled 2022-12-29: qty 4

## 2022-12-29 MED ORDER — CHLORHEXIDINE GLUCONATE CLOTH 2 % EX PADS
6.0000 | MEDICATED_PAD | Freq: Every day | CUTANEOUS | Status: DC
  Administered 2022-12-29: 6 via TOPICAL

## 2022-12-29 MED ORDER — SODIUM ZIRCONIUM CYCLOSILICATE 10 G PO PACK
10.0000 g | PACK | Freq: Once | ORAL | Status: AC
  Administered 2022-12-29: 10 g via ORAL
  Filled 2022-12-29: qty 1

## 2022-12-29 NOTE — Progress Notes (Signed)
Called MD patient very confused and combative.  It took 2 PACU RN's to keep patient in bed.  Patient HR 120's, RR in 30's.  Patient states he wants to go fishing.  MDA will come to PACU.

## 2022-12-29 NOTE — Progress Notes (Signed)
PCCM at bedside evaluating patient.

## 2022-12-29 NOTE — Progress Notes (Signed)
  Patient's bedside nurse reported that patient was very agitated and he signed out AMA paperwork and left the facility with his wife.  The IV line has been removed before he has left the hospital.  I was not able to talk with the patient in person as he left the hospital before I have reached to 3W.  Tereasa Coop, MD [facility] 12/29/2022, 9:17 PM

## 2022-12-29 NOTE — Progress Notes (Signed)
Primary RN notified that patient was agitated and trying to leave against medical advice. Patient was not redirectable and became combative with staff while attempting to leave. Security and MD paged to bedside. Patient left unit with wife and stopped in waiting room on second floor after agreeing to sign against medical advice form. RN attempted to educated patient on risks associated with leaving against medical advice. Patient declined education. Form signed. No IV access to remove from patient. AC John notified of situation. RN attempted to notify on call urologist, but no answer. RN left voicemail with call back number.

## 2022-12-29 NOTE — Progress Notes (Signed)
RN unable to complete head-to-toe assessment on patient. Patient left AMA. See previous notes from RN and MD.

## 2022-12-29 NOTE — H&P (Signed)
History and Physical    Marcus Simpson MVH:[id number] DOB: June 04, 1945 DOA: 12/28/2022  PCP: Ignatius Specking, MD   Patient coming from:  ED - ED transfer from [facility]   Chief Complaint:  Chief Complaint  Patient presents with   Vomiting   Fever    HPI:  TEVAUGHN SANTIZO is a 77 y.o. male with hx of prostate cancer, hypertension, diabetes, who was transferred ED to the ED from [facility] due to sepsis with concern for an infected right ureteral stone.  Reports he has had fever over the past 1 week, associated with right flank pain.  Also having nausea, vomiting.  No dysuria.  Does have difficulty voiding with his history of prostate cancer.  Reports recent history of dry cough.  No other URI symptoms, no sick contacts.  Was a former smoker denies any known underlying lung disease    Review of Systems:  ROS complete and negative except as marked above   Allergies  Allergen Reactions   Dilaudid [Hydromorphone Hcl] Anaphylaxis   Hydromorphone Other (See Comments)    Other reaction(s): Irregular heart rate    Prior to Admission medications   Medication Sig Start Date End Date Taking? Authorizing Provider  allopurinol (ZYLOPRIM) 100 MG tablet Take 100 mg by mouth daily. Patient not taking: Reported on 06/29/2022    [provider]  aspirin 81 MG chewable tablet Chew by mouth daily.    [provider]  cetirizine (ZYRTEC) 10 MG tablet Take 10 mg by mouth at bedtime. Patient not taking: Reported on 06/29/2022    [provider]  clobetasol cream (TEMOVATE) 0.05 % Apply 1 application. topically 2 (two) times daily. Patient not taking: Reported on 06/29/2022 05/23/21   [provider]  clotrimazole-betamethasone (LOTRISONE) cream APPLY TO THE AFFECTED AREA(S) TOPICALLY TWICE DAILY 08/26/22   Marcine Matar, MD  cyclobenzaprine (FLEXERIL) 10 MG tablet Take 1 tablet (10 mg total) by mouth 3 (three) times daily as needed for muscle spasms. Patient not  taking: Reported on 06/29/2022 06/28/21   Bethann Berkshire, MD  diazepam (VALIUM) 5 MG tablet Take 2 tabs 1 hr before MRI 06/29/22   Marcine Matar, MD  gabapentin (NEURONTIN) 400 MG capsule Take 400 mg by mouth 2 (two) times daily. Patient not taking: Reported on 06/29/2022 06/25/21   [provider]  Gluc-Chonn-MSM-Boswellia-Vit D (GLUCOSAMINE CHOND TRIPLE/VIT D PO) Take 2 tablets by mouth daily. Patient not taking: Reported on 06/29/2022    [provider]  indomethacin (INDOCIN) 25 MG capsule Take 25 mg by mouth 3 (three) times daily. 05/20/21   [provider]  loperamide (IMODIUM) 2 MG capsule Take 1 capsule (2 mg total) by mouth 2 (two) times daily. Patient not taking: Reported on 06/29/2022 04/02/15   Malissa Hippo, MD  meclizine (ANTIVERT) 25 MG tablet Take 25 mg by mouth 3 (three) times daily as needed for dizziness. Take 1/2 to 1 tablet 3 times a day as needed for Dizziness Patient not taking: Reported on 06/29/2022    [provider]  Melatonin 5 MG CAPS Take by mouth.    [provider]  methocarbamol (ROBAXIN) 500 MG tablet Take 1 tablet (500 mg total) by mouth every 6 (six) hours as needed for muscle spasms. Patient not taking: Reported on 06/29/2022 05/03/17   Julien Girt, Alexzandrew L, PA-C  metoprolol succinate (TOPROL-XL) 25 MG 24 hr tablet Take 25 mg by mouth 2 (two) times daily. 09/01/20   [provider]  Multiple  Vitamin (MULTIVITAMIN) tablet Take 1 tablet by mouth daily.    [provider]  oxyCODONE (OXY IR/ROXICODONE) 5 MG immediate release tablet Take 1-2 tablets (5-10 mg total) by mouth every 4 (four) hours as needed for moderate pain or severe pain. Patient not taking: Reported on 06/29/2022 05/03/17   Julien Girt, Alexzandrew L, PA-C  oxyCODONE-acetaminophen (PERCOCET) 5-325 MG tablet Take 1 tablet by mouth every 6 (six) hours as needed. Patient not taking: Reported on 06/29/2022 06/28/21   Bethann Berkshire, MD  rivaroxaban (XARELTO) 10  MG TABS tablet Take 1 tablet (10 mg total) by mouth daily with breakfast. Take Xarelto for two and a half more weeks following discharge from the hospital, then discontinue Xarelto. Once the patient has completed the Xarelto, they may resume the 81 mg Aspirin. Patient not taking: Reported on 06/29/2022 05/04/17   Julien Girt, Alexzandrew L, PA-C  rosuvastatin (CRESTOR) 5 MG tablet Take 5 mg by mouth daily.    [provider]  tamsulosin (FLOMAX) 0.4 MG CAPS capsule Take 1 capsule (0.4 mg total) by mouth daily after supper. Patient not taking: Reported on 06/29/2022 11/04/20   Marcine Matar, MD  triamcinolone cream (KENALOG) 0.1 % Apply 1 application topically 2 (two) times daily. Patient not taking: Reported on 06/29/2022 05/24/21   Eber Hong, MD  valACYclovir (VALTREX) 1000 MG tablet Take 1 tablet (1,000 mg total) by mouth 3 (three) times daily. Patient not taking: Reported on 06/29/2022 05/24/21   Eber Hong, MD    Past Medical History:  Diagnosis Date   Arthritis    Bradycardia    asymptomatic    History of kidney stones    Hypertension    Prostate cancer [facility])     Past Surgical History:  Procedure Laterality Date   COLONOSCOPY     FLEXIBLE SIGMOIDOSCOPY N/A 04/02/2015   Procedure: FLEXIBLE SIGMOIDOSCOPY;  Surgeon: Malissa Hippo, MD;  Location: [facility]ITE;  Service: Endoscopy;  Laterality: N/A;  1:45 - moved to 1/11 @ 8:30 - Ann to notify   KIDNEY STONE SURGERY     TOTAL KNEE ARTHROPLASTY Left 05/02/2017   Procedure: LEFT TOTAL KNEE ARTHROPLASTY;  Surgeon: Ollen Gross, MD;  Location: [facility] ORS;  Service: Orthopedics;  Laterality: Left;     reports that he has quit smoking. His smoking use included cigarettes. He has never used smokeless tobacco. He reports that he does not drink alcohol and does not use drugs.  History reviewed. No pertinent family history.   Physical Exam: Vitals:   12/29/22 0030 12/29/22 0045 12/29/22 0100 12/29/22 0120  BP: 119/70 (!) 125/57 (!)  121/56 (!) 154/66  Pulse: 91 88 86 88  Resp: (!) 21 (!) 26 (!) 25 18  Temp:    98.3 F (36.8 C)  TempSrc:    Oral  SpO2: 93% 94% 93% 100%  Weight:        Gen: Awake, alert, NAD   CV: Regular, normal S1, S2, no murmurs  Resp: Increased work of breathing, tachypneic, diffuse expiratory wheezes  abd: Flat, normoactive, nontender MSK: Symmetric, no edema  Skin: No rashes or lesions to exposed skin  Neuro: Alert and interactive  Psych: euthymic, appropriate    Data review:   Labs reviewed, notable for:   Lactate uptrending 2.9-> 5.5 Bicarb 16, anion gap 13 Creatinine 2.6 (baseline 1.2) T. bili 2.1, alk phos 170, other LFT normal WBC 22 UA consistent with infection   Micro:  Results for orders placed or performed during the hospital encounter of 12/28/22  Resp panel by RT-PCR (RSV, Flu A&B, Covid) Anterior Nasal Swab     Status: None   Collection Time: 12/28/22 12:33 PM   Specimen: Anterior Nasal Swab  Result Value Ref Range Status   SARS Coronavirus 2 by RT PCR NEGATIVE NEGATIVE Final    Comment: (NOTE) SARS-CoV-2 target nucleic acids are NOT DETECTED.  The SARS-CoV-2 RNA is generally detectable in upper respiratory specimens during the acute phase of infection. The lowest concentration of SARS-CoV-2 viral copies this assay can detect is 138 copies/mL. A negative result does not preclude SARS-Cov-2 infection and should not be used as the sole basis for treatment or other patient management decisions. A negative result may occur with  improper specimen collection/handling, submission of specimen other than nasopharyngeal swab, presence of viral mutation(s) within the areas targeted by this assay, and inadequate number of viral copies(<138 copies/mL). A negative result must be combined with clinical observations, patient history, and epidemiological information. The expected result is Negative.  Fact Sheet for Patients:   BloggerCourse.com  Fact Sheet for Healthcare Providers:  SeriousBroker.it  This test is no t yet approved or cleared by the [place] FDA and  has been authorized for detection and/or diagnosis of SARS-CoV-2 by FDA under an Emergency Use Authorization (EUA). This EUA will remain  in effect (meaning this test can be used) for the duration of the COVID-19 declaration under Section 564(b)(1) of the Act, 21 U.S.C.section 360bbb-3(b)(1), unless the authorization is terminated  or revoked sooner.       Influenza A by PCR NEGATIVE NEGATIVE Final   Influenza B by PCR NEGATIVE NEGATIVE Final    Comment: (NOTE) The Xpert Xpress SARS-CoV-2/FLU/RSV plus assay is intended as an aid in the diagnosis of influenza from Nasopharyngeal swab specimens and should not be used as a sole basis for treatment. Nasal washings and aspirates are unacceptable for Xpert Xpress SARS-CoV-2/FLU/RSV testing.  Fact Sheet for Patients: BloggerCourse.com  Fact Sheet for Healthcare Providers: SeriousBroker.it  This test is not yet approved or cleared by the [place] FDA and has been authorized for detection and/or diagnosis of SARS-CoV-2 by FDA under an Emergency Use Authorization (EUA). This EUA will remain in effect (meaning this test can be used) for the duration of the COVID-19 declaration under Section 564(b)(1) of the Act, 21 U.S.C. section 360bbb-3(b)(1), unless the authorization is terminated or revoked.     Resp Syncytial Virus by PCR NEGATIVE NEGATIVE Final    Comment: (NOTE) Fact Sheet for Patients: BloggerCourse.com  Fact Sheet for Healthcare Providers: SeriousBroker.it  This test is not yet approved or cleared by the [place] FDA and has been authorized for detection and/or diagnosis of SARS-CoV-2 by FDA under an Emergency Use  Authorization (EUA). This EUA will remain in effect (meaning this test can be used) for the duration of the COVID-19 declaration under Section 564(b)(1) of the Act, 21 U.S.C. section 360bbb-3(b)(1), unless the authorization is terminated or revoked.  Performed at [facility], [address], [place], [place] [address]     Imaging reviewed:  DG CHEST PORT 1 VIEW  Result Date: 12/29/2022 CLINICAL DATA:  Tachypnea.  Status post stent placement. EXAM: PORTABLE CHEST 1 VIEW COMPARISON:  Chest radiograph dated 12/28/2022. FINDINGS: Left lung base atelectasis. No focal consolidation, pleural effusion, or pneumothorax. The cardiac silhouette is within normal limits. No acute osseous pathology. IMPRESSION: Left lung base atelectasis. No focal consolidation. Electronically Signed   By: Elgie Collard M.D.   On: 12/29/2022 00:37   DG  C-Arm 1-60 Min  Result Date: 12/29/2022 CLINICAL DATA:  Urolithiasis, intraoperative examination EXAM: DG C-ARM 1-60 MIN CONTRAST:  Not listed, refer to operative report FLUOROSCOPY: Fluoroscopy Time:  22.7 seconds Radiation Exposure Index (if provided by the fluoroscopic device): 11.51 mGy Number of Acquired Spot Images: 3 COMPARISON:  None Available. FINDINGS: Three fluoroscopic intraoperative radiographs demonstrate retrograde right nephrostogram demonstrated mild hydroureter. Filling defect is identified within the upper pole calices compatible with the known calculus seen on CT examination of 12/28/2022. Focal collection of contrast within the lower pole the right kidney favors intravasation. Final image demonstrates placement of a double-J ureteral stent with visualization of the superior portion the stent extending into the expected upper calices of the right kidney. IMPRESSION: 1. Intraoperative examination as described above. Electronically Signed   By: Helyn Numbers M.D.   On: 12/29/2022 00:29   DG Abd 1 View  Result Date: 12/28/2022 CLINICAL DATA:  Status post  cystoscopy with ureteral stent placement. EXAM: ABDOMEN - 1 VIEW COMPARISON:  02/14/2018, 12/28/2022. FINDINGS: Four fluoroscopic images were obtained for right ureteral stent placement. Right renal calculi with mild-to-moderate hydronephrosis are noted. Total fluoroscopy time is 23 seconds. Dose: 11.51 mGy. Please see operative report for additional information. IMPRESSION: Intraoperative utilization of fluoroscopy. Electronically Signed   By: Thornell Sartorius M.D.   On: 12/28/2022 23:46   CT CHEST ABDOMEN PELVIS WO CONTRAST  Result Date: 12/28/2022 CLINICAL DATA:  Sepsis, fever and altered mental status. EXAM: CT CHEST, ABDOMEN AND PELVIS WITHOUT CONTRAST TECHNIQUE: Multidetector CT imaging of the chest, abdomen and pelvis was performed following the standard protocol without IV contrast. RADIATION DOSE REDUCTION: This exam was performed according to the departmental dose-optimization program which includes automated exposure control, adjustment of the mA and/or kV according to patient size and/or use of iterative reconstruction technique. COMPARISON:  09/03/2020 FINDINGS: CT CHEST FINDINGS Cardiovascular: The heart size is within normal limits. No pericardial fluid is identified. Top-normal heart size. No visualized calcified coronary artery plaque. Normal caliber thoracic aorta demonstrating mild atherosclerosis. Central pulmonary arteries are normal in caliber. Mediastinum/Nodes: No enlarged mediastinal, hilar, or axillary lymph nodes. Thyroid gland, trachea, and esophagus demonstrate no significant findings. Lungs/Pleura: Scattered bilateral scarring and atelectasis without overt focal airspace disease, pulmonary edema, pneumothorax, mass or pleural fluid. Musculoskeletal: No chest wall mass or suspicious bone lesions identified. CT ABDOMEN PELVIS FINDINGS Hepatobiliary: Evidence of hepatic steatosis. The gallbladder is unremarkable. No biliary ductal dilatation. Pancreas: Unremarkable. No pancreatic ductal  dilatation or surrounding inflammatory changes. Spleen: Normal in size without focal abnormality. Adrenals/Urinary Tract: No adrenal masses. Multiple bilateral renal calculi again noted. There is evidence of moderate right-sided hydronephrosis secondary to an obstructing calculi in the mid to distal ureter at the level of the upper sacrum. There are at least 2-3 calculi on top of one another with the largest measuring up to 10 mm in diameter on axial images. Conglomerate total height of calculi within the ureter is approximately 2 cm on the sagittal reconstructions. No left-sided hydronephrosis. The bladder is unremarkable. Stomach/Bowel: Bowel shows no evidence of obstruction, ileus, inflammation or lesion. The appendix is not discretely visualized. No free intraperitoneal air. Vascular/Lymphatic: No significant vascular findings are present. No enlarged abdominal or pelvic lymph nodes. Reproductive: Prostate is unremarkable. Other: Small left inguinal hernia contains fat. No ascites or focal abscess identified. Musculoskeletal: Stable osteopenic compression deformity of the L1 vertebral body. IMPRESSION: 1. Moderate right-sided hydronephrosis secondary to obstructing calculi in the mid to distal right ureter at the level  of the upper sacrum. There are at least 2-3 calculi on top of one another with the largest measuring up to 10 mm in diameter on axial images. Conglomerate total height of calculi is approximately 2 cm on the sagittal reconstructions. 2. Hepatic steatosis. 3. Small left inguinal hernia contains fat. 4. Stable osteopenic compression deformity of the L1 vertebral body. Electronically Signed   By: Irish Lack M.D.   On: 12/28/2022 18:08   DG Chest 2 View  Result Date: 12/28/2022 CLINICAL DATA:  Cough, fever EXAM: CHEST - 2 VIEW COMPARISON:  08/27/2006 FINDINGS: Coarse perihilar interstitial markings have increased since previous. No confluent airspace disease. Heart size and mediastinal  contours are within normal limits. No effusion. Stable vertebral compression deformity near the thoracolumbar junction. IMPRESSION: Increased perihilar interstitial markings suggesting bronchitis. No focal infiltrate. Electronically Signed   By: Corlis Leak M.D.   On: 12/28/2022 14:23    EKG:  Sinus tachycardia, ST depression inferolaterally  ED Course:  Urology initially consulted at [facility] who recommended for transfer to [facility].  Was treated with vancomycin, cefepime, Flagyl.  Received approximately 3 L+ of IV fluid prior to admission   Assessment/Plan:  77 y.o. male with hx  prostate cancer, hypertension, diabetes, who was transferred ED to the ED from [facility] due to sepsis with concern for an infected right ureteral stone  Infected right ureteral stone Sepsis secondary to above, present on admission Lactic acidosis, secondary to above, ongoing 1 week of fever and right flank pain prior to presentation.  Sepsis criteria: Leukocytosis, fever 38C, tachycardia, tachypnea.  Source infected ureteral stone.  At [facility] CT A/P demonstrating moderate right-sided hydronephrosis with mid distal right ureteral stones, with largest 1 cm.  UA consistent with infection.  Urology had been consulted at outside hospital and recommended transfer to [facility] for intervention. -Urology consulted, and now status post right ureteral stent placement -Blood cultures and urine cultures are pending -Continue vancomycin and cefepime pharmacy to dose.  Stop Flagyl -Has progressively worse lactic acidosis, but has received large-volume fluid resuscitation and concern for possibly developing pulmonary edema.  Will hold on additional IV fluids.  Repeat lactate in morning  AKI stage II Background CKD stage III Baseline creatinine approximately 1.2.  Elevated to 2.6 on admission.  Although he has obstructive hydronephrosis on the right side.  His AKI appears out of proportion especially with unilateral  obstruction.  Suspect degree of bladder outlet obstruction with his history of BPH, and likely also prerenal with sepsis. -Status post right ureteral stent placement.  Foley also placed in OR -Continue Foley catheter for now, will need void trial prior to discharge -Consider starting on tamsulosin -Oral hydration  Acute hypoxic respiratory failure: Prior to going to the OR he had mild respiratory distress, tachypnea, diffuse wheezing.  Had received large-volume fluid resuscitation with 3 L.  Postoperatively with respiratory distress postextubation, tachypneic in 20s to 30s.  Acute hypoxia requiring 4 L oxygen.  -PCCM consulted due to his acute respiratory failure and also agitation. -Chest x-ray and VBG repeated, he has a partially compensated metabolic acidosis with respiratory alkalosis, chest x-ray with left lung base atelectasis. -Give Lasix 20 mg IV x 1 with suspected mild pulmonary edema -DuoNebs scheduled every 6 hours, I-S, flutter valve, encourage out of bed to chair -Check RPP  Acute metabolic encephalopathy, postoperative: Resolved Postoperatively agitated, required Precedex push.  Since this time has not required any sedation.  Now back to his baseline, alert and oriented. -  Delirium precautions  Acute liver injury, cholestatic Suspect this is in the setting of sepsis -Trend LFT  Chronic medical problems: History of prostate cancer: Outpatient follow-up, see above re: Foley History diabetes: Home regimen is glipizide 2.5 mg.  Hold home glipizide inside.  SSI while inpatient Hypertension: Hold his home metoprolol while inpatient Question ASCVD: Continue home aspirin daily, rosuvastatin.    Body mass index is 29.99 kg/m.    DVT prophylaxis:  SCDs Code Status:  DNR/DNI(Do NOT Intubate) ; confirmed with patient.  Diet:  Diet Orders (From admission, onward)     Start     Ordered   12/29/22 0136  Diet regular Room service appropriate? Yes; Fluid consistency: Thin  Diet  effective now       Question Answer Comment  Room service appropriate? Yes   Fluid consistency: Thin      12/29/22 0135           Family Communication:  No   Consults:  Urology   Admission status:   Inpatient, Step Down Unit  Severity of Illness: The appropriate patient status for this patient is INPATIENT. Inpatient status is judged to be reasonable and necessary in order to provide the required intensity of service to ensure the patient's safety. The patient's presenting symptoms, physical exam findings, and initial radiographic and laboratory data in the context of their chronic comorbidities is felt to place them at high risk for further clinical deterioration. Furthermore, it is not anticipated that the patient will be medically stable for discharge from the hospital within 2 midnights of admission.   * I certify that at the point of admission it is my clinical judgment that the patient will require inpatient hospital care spanning beyond 2 midnights from the point of admission due to high intensity of service, high risk for further deterioration and high frequency of surveillance required.*   Dolly Rias, MD [facility]  How to contact the [facility] Attending or Consulting provider 7A - 7P or covering provider during after hours 7P -7A, for this patient.  Check the care team in [facility] and look for a) attending/consulting [facility] provider listed and b) the [facility] team listed Log into [email or web address] and use [facility] universal password to access. If you do not have the password, please contact the hospital operator. Locate the [facility] provider you are looking for under [facility] and page to a number that you can be directly reached. If you still have difficulty reaching the provider, please page the [facility] (Director on Call) for the Hospitalists listed on amion for assistance.  12/29/2022, 2:12 AM

## 2022-12-29 NOTE — Progress Notes (Signed)
Pt pulled out his IV. Said I told him to and when I told him I did not, he said well someone did and he is going home first thing in the morning so he is not allowing me to replace it. He has had a great day up until just now and all of a sudden he is getting agitated and said he is going home.  I offered to walk him in the hallway to get him up and moving and he refused.  Said he does not need to get up or walk right now.  I did notify Dr Alverda Skeans, who said "OK".

## 2022-12-29 NOTE — Plan of Care (Signed)

## 2022-12-29 NOTE — Anesthesia Postprocedure Evaluation (Signed)
Anesthesia Post Note  Patient: Marcus Simpson  Procedure(s) Performed: CYSTOSCOPY WITH STENT PLACEMENT (Right)     Patient location during evaluation: Nursing Unit Anesthesia Type: General Level of consciousness: awake and patient cooperative Pain management: pain level controlled Vital Signs Assessment: post-procedure vital signs reviewed and stable Respiratory status: spontaneous breathing, nonlabored ventilation, respiratory function stable and patient connected to nasal cannula oxygen Cardiovascular status: blood pressure returned to baseline and stable Postop Assessment: no apparent nausea or vomiting Anesthetic complications: no   No notable events documented.  Last Vitals:  Vitals:   12/29/22 0120 12/29/22 0406  BP: (!) 154/66 122/64  Pulse: 88 78  Resp: 18 16  Temp: 36.8 C 36.7 C  SpO2: 100% 97%    Last Pain:  Vitals:   12/29/22 0406  TempSrc: Oral  PainSc:                  Beni Turrell

## 2022-12-29 NOTE — Care Plan (Addendum)
This 77 years old male with history of prostate cancer, hypertension, diabetes presented in the ED with a concern for any infection, right ureteral stone.  Patient has been having fever for 1 week associated with right flank pain.  Patient also reported difficulty voiding with his history of prostate cancer.  Workup reveals lactic acidosis, sepsis, AKI, CT A&P shows moderate right-sided hydronephrosis with mid to distal right ureteral stone.  Urology consulted patient underwent right ureteral stent placement.  Renal functions are worsening today the patient is feeling improved.  Continue IV hydration and IV antibiotics.  He is found to have elevated troponin, denies any chest pain, shortness of breath.  Potassium was 6.4 and was given Lokelma x 1.  Recheck potassium. Patient was seen and examined at bedside.

## 2022-12-29 NOTE — Progress Notes (Signed)
1 Day Post-Op Subjective: Patient was very confused and combative postoperatively.  Patient is accompanied by his wife and neighbor.  Long conversation had with all of them and all questions were answered to their satisfaction.  Objective: Vital signs in last 24 hours: Temp:  [97.9 F (36.6 C)-99 F (37.2 C)] 98.1 F (36.7 C) (10/09 1131) Pulse Rate:  [72-119] 72 (10/09 1131) Resp:  [16-30] 20 (10/09 1131) BP: (101-187)/(45-72) 131/71 (10/09 1131) SpO2:  [92 %-100 %] 96 % (10/09 1131)  Assessment/Plan: 77 year old male presenting to the hospital septic with UTI, obstructing right ureteral stones, fever, chills, and N/V x 1 week.  PMH significant for cancer and remotely saw Dr. Retta Diones, GG 1 in 2016 monitored with PSA and MRI.  # Ureteral stone 2 cm of stone material in aggregate noted in the right ureter on CT A/P.  To the OR for cystoscopy with stent placement with Dr. Alvester Morin on 01/28/2023.  Patient had significant AKI and leukocytosis, both of which have worsened today though he is clinically improved.  Discussed that we would continue to push fluids and continue broad ABX.  This should return to baseline over the next few days.  # Prostate cancer # Urinary retention Patient reports that he has been struggling to urinate and has to sit down and strain for some time.  Will leave Foley catheter in place for the time being. Start Flomax Intake/Output from previous day: 10/08 0701 - 10/09 0700 In: 3918.9 [I.V.:500; IV Piggyback:3418.9] Out: 1130 [Urine:1125; Blood:5]  Intake/Output this shift: Total I/O In: 240 [P.O.:240] Out: 1350 [Urine:1350]  Physical Exam:  General: Alert and oriented CV: No cyanosis Lungs: equal chest rise Gu: Foley in place draining clear yellow urine.  Lab Results: Recent Labs    12/28/22 1249 12/29/22 0524  HGB 12.8* 11.8*  HCT 39.5 35.3*   BMET Recent Labs    12/28/22 1249 12/29/22 0524  NA 135 135  K 4.1 6.4*  CL 106 103  CO2 16*  17*  GLUCOSE 116* 148*  BUN 37* 37*  CREATININE 2.62* 3.05*  CALCIUM 8.7* 8.5*     Studies/Results: DG CHEST PORT 1 VIEW  Result Date: 12/29/2022 CLINICAL DATA:  Tachypnea.  Status post stent placement. EXAM: PORTABLE CHEST 1 VIEW COMPARISON:  Chest radiograph dated 12/28/2022. FINDINGS: Left lung base atelectasis. No focal consolidation, pleural effusion, or pneumothorax. The cardiac silhouette is within normal limits. No acute osseous pathology. IMPRESSION: Left lung base atelectasis. No focal consolidation. Electronically Signed   By: Elgie Collard M.D.   On: 12/29/2022 00:37   DG C-Arm 1-60 Min  Result Date: 12/29/2022 CLINICAL DATA:  Urolithiasis, intraoperative examination EXAM: DG C-ARM 1-60 MIN CONTRAST:  Not listed, refer to operative report FLUOROSCOPY: Fluoroscopy Time:  22.7 seconds Radiation Exposure Index (if provided by the fluoroscopic device): 11.51 mGy Number of Acquired Spot Images: 3 COMPARISON:  None Available. FINDINGS: Three fluoroscopic intraoperative radiographs demonstrate retrograde right nephrostogram demonstrated mild hydroureter. Filling defect is identified within the upper pole calices compatible with the known calculus seen on CT examination of 12/28/2022. Focal collection of contrast within the lower pole the right kidney favors intravasation. Final image demonstrates placement of a double-J ureteral stent with visualization of the superior portion the stent extending into the expected upper calices of the right kidney. IMPRESSION: 1. Intraoperative examination as described above. Electronically Signed   By: Helyn Numbers M.D.   On: 12/29/2022 00:29   DG Abd 1 View  Result Date: 12/28/2022 CLINICAL DATA:  Status post cystoscopy with ureteral stent placement. EXAM: ABDOMEN - 1 VIEW COMPARISON:  02/14/2018, 12/28/2022. FINDINGS: Four fluoroscopic images were obtained for right ureteral stent placement. Right renal calculi with mild-to-moderate hydronephrosis are  noted. Total fluoroscopy time is 23 seconds. Dose: 11.51 mGy. Please see operative report for additional information. IMPRESSION: Intraoperative utilization of fluoroscopy. Electronically Signed   By: Thornell Sartorius M.D.   On: 12/28/2022 23:46   CT CHEST ABDOMEN PELVIS WO CONTRAST  Result Date: 12/28/2022 CLINICAL DATA:  Sepsis, fever and altered mental status. EXAM: CT CHEST, ABDOMEN AND PELVIS WITHOUT CONTRAST TECHNIQUE: Multidetector CT imaging of the chest, abdomen and pelvis was performed following the standard protocol without IV contrast. RADIATION DOSE REDUCTION: This exam was performed according to the departmental dose-optimization program which includes automated exposure control, adjustment of the mA and/or kV according to patient size and/or use of iterative reconstruction technique. COMPARISON:  09/03/2020 FINDINGS: CT CHEST FINDINGS Cardiovascular: The heart size is within normal limits. No pericardial fluid is identified. Top-normal heart size. No visualized calcified coronary artery plaque. Normal caliber thoracic aorta demonstrating mild atherosclerosis. Central pulmonary arteries are normal in caliber. Mediastinum/Nodes: No enlarged mediastinal, hilar, or axillary lymph nodes. Thyroid gland, trachea, and esophagus demonstrate no significant findings. Lungs/Pleura: Scattered bilateral scarring and atelectasis without overt focal airspace disease, pulmonary edema, pneumothorax, mass or pleural fluid. Musculoskeletal: No chest wall mass or suspicious bone lesions identified. CT ABDOMEN PELVIS FINDINGS Hepatobiliary: Evidence of hepatic steatosis. The gallbladder is unremarkable. No biliary ductal dilatation. Pancreas: Unremarkable. No pancreatic ductal dilatation or surrounding inflammatory changes. Spleen: Normal in size without focal abnormality. Adrenals/Urinary Tract: No adrenal masses. Multiple bilateral renal calculi again noted. There is evidence of moderate right-sided hydronephrosis  secondary to an obstructing calculi in the mid to distal ureter at the level of the upper sacrum. There are at least 2-3 calculi on top of one another with the largest measuring up to 10 mm in diameter on axial images. Conglomerate total height of calculi within the ureter is approximately 2 cm on the sagittal reconstructions. No left-sided hydronephrosis. The bladder is unremarkable. Stomach/Bowel: Bowel shows no evidence of obstruction, ileus, inflammation or lesion. The appendix is not discretely visualized. No free intraperitoneal air. Vascular/Lymphatic: No significant vascular findings are present. No enlarged abdominal or pelvic lymph nodes. Reproductive: Prostate is unremarkable. Other: Small left inguinal hernia contains fat. No ascites or focal abscess identified. Musculoskeletal: Stable osteopenic compression deformity of the L1 vertebral body. IMPRESSION: 1. Moderate right-sided hydronephrosis secondary to obstructing calculi in the mid to distal right ureter at the level of the upper sacrum. There are at least 2-3 calculi on top of one another with the largest measuring up to 10 mm in diameter on axial images. Conglomerate total height of calculi is approximately 2 cm on the sagittal reconstructions. 2. Hepatic steatosis. 3. Small left inguinal hernia contains fat. 4. Stable osteopenic compression deformity of the L1 vertebral body. Electronically Signed   By: Irish Lack M.D.   On: 12/28/2022 18:08   DG Chest 2 View  Result Date: 12/28/2022 CLINICAL DATA:  Cough, fever EXAM: CHEST - 2 VIEW COMPARISON:  08/27/2006 FINDINGS: Coarse perihilar interstitial markings have increased since previous. No confluent airspace disease. Heart size and mediastinal contours are within normal limits. No effusion. Stable vertebral compression deformity near the thoracolumbar junction. IMPRESSION: Increased perihilar interstitial markings suggesting bronchitis. No focal infiltrate. Electronically Signed   By: Corlis Leak M.D.   On: 12/28/2022 14:23  LOS: 1 day   Elmon Kirschner, NP [name] Pager: [phone]  12/29/2022, 12:24 PM

## 2022-12-29 NOTE — TOC Initial Note (Signed)
Transition of Care [facility]) - Initial/Assessment Note    Patient Details  Name: Marcus Simpson MRN: [id number] Date of Birth: 19-Jan-1946  [facility]) CM/SW Contact:    Kermit Balo, RN Phone Number: 12/29/2022, 3:17 PM  Clinical Narrative:                  Patient is from home with spouse. He states they are together all the time.  Pt provides needed transportation. His spouse doesn't drive. He says his neighbors will assist with transportation if needed.  Pt manages his own medications and denies any issues.  Pt states his neighbor will pick him up at d/c. TOC following.  Expected Discharge Plan: Home/Self Care Barriers to Discharge: Continued Medical Work up   Patient Goals and CMS Choice            Expected Discharge Plan and Services       Living arrangements for the past 2 months: Single Family Home                                      Prior Living Arrangements/Services Living arrangements for the past 2 months: Single Family Home Lives with:: Spouse Patient language and need for interpreter reviewed:: Yes Do you feel safe going back to the place where you live?: Yes        Care giver support system in place?: Yes (comment) Current home services: DME (pt states all DME) Criminal Activity/Legal Involvement Pertinent to Current Situation/Hospitalization: No - Comment as needed  Activities of Daily Living   ADL Screening (condition at time of admission) Independently performs ADLs?: Yes (appropriate for developmental age) Is the patient deaf or have difficulty hearing?: No Does the patient have difficulty seeing, even when wearing glasses/contacts?: No Does the patient have difficulty concentrating, remembering, or making decisions?: No  Permission Sought/Granted                  Emotional Assessment Appearance:: Appears stated age Attitude/Demeanor/Rapport: Engaged Affect (typically observed): Accepting Orientation: :  Oriented to Self, Oriented to Place, Oriented to  Time, Oriented to Situation   Psych Involvement: No (comment)  Admission diagnosis:  Sepsis secondary to UTI (HCC) [A41.9, N39.0] Ureteral stone with hydronephrosis [N13.2] Hydronephrosis with obstructing calculus [N13.2] Patient Active Problem List   Diagnosis Date Noted   Sepsis secondary to UTI (HCC) 12/29/2022   Acute encephalopathy 12/29/2022   Hydronephrosis with obstructing calculus 12/29/2022   Ureteral stone with hydronephrosis 12/28/2022   OA (osteoarthritis) of knee 05/02/2017   Diarrhea 03/10/2015   Prostate cancer (HCC) 03/10/2015   HYPERLIPIDEMIA-MIXED 11/13/2008   Essential hypertension 11/13/2008   SYNCOPE AND COLLAPSE 11/13/2008   PCP:  Ignatius Specking, MD Pharmacy:   [facility] - [place], [place] - [address] [place] [address] Phone: [phone] Fax: [phone]     Social Determinants of Health (SDOH) Social History: SDOH Screenings   Food Insecurity: No Food Insecurity (12/29/2022)  Housing: Low Risk  (12/29/2022)  Transportation Needs: No Transportation Needs (12/29/2022)  Utilities: Not At Risk (12/29/2022)  Tobacco Use: Medium Risk (12/28/2022)   SDOH Interventions:     Readmission Risk Interventions     No data to display

## 2022-12-29 NOTE — Consult Note (Addendum)
NAME:  Marcus Simpson, MRN:  [id number], DOB:  03/10/1946, LOS: 1 ADMISSION DATE:  12/28/2022, CONSULTATION DATE:  10/9 REFERRING MD:  Dr. Lazarus Salines, CHIEF COMPLAINT: Ureteral calculi w/ right hydroureteronephrosis s/p ureter stent; agitation delirium   History of Present Illness:  Patient is a 77 year old male with pertinent PMH HTN, nephrolithiasis, prostate cancer presents to [facility] ED on 10/8 nephrolithiasis.  Patient complaining of fevers, chills, N/V over the past week.  Also having some upper abdominal/lower chest pain, cough.  Denies any CVA tenderness.  In ED patient febrile 100.4 F and tachycardic 110s.  LA 2.9 and WBC 22.9.  UA with negative nitrites and small leukocytes.  Urine culture and blood cultures sent.  Given IV fluids and started on Vanco and cefepime.  CT ABD/pelvis showing multiple mid distal right ureteral calculi up to 1 cm with moderate right hydroureteronephrosis.  Urology consulted and placed ureteral stent.  While in PACU patient developed agitation delirium requiring to be placed given a Precedex push.  PCCM consulted.  Pertinent  Medical History   Past Medical History:  Diagnosis Date   Arthritis    Bradycardia    asymptomatic    History of kidney stones    Hypertension    Prostate cancer (HCC)      Significant Hospital Events: Including procedures, antibiotic start and stop dates in addition to other pertinent events   10/8 overnight admitted for Ureteral calculi w/ right hydroureteronephrosis s/p ureter stent 10/9 pccm consulted for agitation delirium   Interim History / Subjective:  Given precedex push by anesthesia 45 minutes ago Now patient is calm, Aox3 BP stable  Objective   Blood pressure (!) 101/45, pulse 82, temperature 98.2 F (36.8 C), resp. rate (!) 23, weight 97.5 kg, SpO2 94%.        Intake/Output Summary (Last 24 hours) at 12/29/2022 0024 Last data filed at 12/28/2022 2326 Gross per 24 hour  Intake 3818.92 ml  Output 130 ml  Net  3688.92 ml   Filed Weights   12/28/22 1224  Weight: 97.5 kg    Examination: General:  NAD HEENT: MM pink/moist; [place] in place Neuro: Aox3; MAE CV: s1s2, RRR, no m/r/g PULM:  dim clear BS bilaterally; [place] GI: soft, bsx4 active  Extremities: warm/dry, no edema  Skin: no rashes or lesions    Resolved Hospital Problem list     Assessment & Plan:   Agitation delirium: improved Plan: -given 1 time push of precedex; currently doing well off precedex -denies any alcohol use -Treat sepsis as below -Limit sedating meds -Delirium precautions -wife currently in waiting room while in PACU; would be helpful for family to stay with patient overnight to assist w/ reorienting patient  -currently patient appears safe to go to progressive. If patient continues to have agitation delirium and requiring to be on precedex will move to icu -PCCM will sign off and available as needed  Ureteral calculi w/ right hydroureteronephrosis s/p ureter stent Sepsis 2/2 to above HTN AKI Prostate cancer Plan: -per urology and primary   Best Practice (right click and "Reselect all SmartList Selections" daily)  Per primary  Labs   CBC: Recent Labs  Lab 12/28/22 1249  WBC 22.9*  NEUTROABS 21.0*  HGB 12.8*  HCT 39.5  MCV 93.8  PLT 244    Basic Metabolic Panel: Recent Labs  Lab 12/28/22 1249  NA 135  K 4.1  CL 106  CO2 16*  GLUCOSE 116*  BUN 37*  CREATININE 2.62*  CALCIUM 8.7*  GFR: CrCl cannot be calculated (Unknown ideal weight.). Recent Labs  Lab 12/28/22 1249 12/28/22 1429 12/28/22 1921  WBC 22.9*  --   --   LATICACIDVEN 2.9* 3.1* 5.5*    Liver Function Tests: Recent Labs  Lab 12/28/22 1249  AST 29  ALT 29  ALKPHOS 170*  BILITOT 2.1*  PROT 8.2*  ALBUMIN 3.3*   No results for input(s): "LIPASE", "AMYLASE" in the last 168 hours. No results for input(s): "AMMONIA" in the last 168 hours.  ABG No results found for: "PHART", "PCO2ART", "PO2ART", "HCO3", "TCO2",  "ACIDBASEDEF", "O2SAT"   Coagulation Profile: No results for input(s): "INR", "PROTIME" in the last 168 hours.  Cardiac Enzymes: No results for input(s): "CKTOTAL", "CKMB", "CKMBINDEX", "TROPONINI" in the last 168 hours.  HbA1C: Hgb A1c MFr Bld  Date/Time Value Ref Range Status  08/27/2006 08:39 AM   Final   6.1 (NOTE)   The ADA recommends the following therapeutic goals for glycemic   control related to Hgb A1C measurement:   Goal of Therapy:   < 7.0% Hgb A1C   Action Suggested:  > 8.0% Hgb A1C   Ref:  Diabetes Care, 22, Suppl. 1, 1999    CBG: No results for input(s): "GLUCAP" in the last 168 hours.  Review of Systems:   Review of Systems  Constitutional:  Positive for fever.  Respiratory:  Positive for cough. Negative for shortness of breath.   Cardiovascular:  Positive for chest pain.  Gastrointestinal:  Positive for abdominal pain, nausea and vomiting. Negative for constipation and diarrhea.  Genitourinary:  Negative for flank pain.     Past Medical History:  He,  has a past medical history of Arthritis, Bradycardia, History of kidney stones, Hypertension, and Prostate cancer (HCC).   Surgical History:   Past Surgical History:  Procedure Laterality Date   COLONOSCOPY     FLEXIBLE SIGMOIDOSCOPY N/A 04/02/2015   Procedure: FLEXIBLE SIGMOIDOSCOPY;  Surgeon: Malissa Hippo, MD;  Location: [facility];  Service: Endoscopy;  Laterality: N/A;  1:45 - moved to 1/11 @ 8:30 - Ann to notify   KIDNEY STONE SURGERY     TOTAL KNEE ARTHROPLASTY Left 05/02/2017   Procedure: LEFT TOTAL KNEE ARTHROPLASTY;  Surgeon: Ollen Gross, MD;  Location: [facility] ORS;  Service: Orthopedics;  Laterality: Left;     Social History:   reports that he has quit smoking. His smoking use included cigarettes. He has never used smokeless tobacco. He reports that he does not drink alcohol and does not use drugs.   Family History:  His family history is not on file.   Allergies Allergies  Allergen  Reactions   Dilaudid [Hydromorphone Hcl] Anaphylaxis   Hydromorphone Other (See Comments)    Other reaction(s): Irregular heart rate     Home Medications  Prior to Admission medications   Medication Sig Start Date End Date Taking? Authorizing Provider  allopurinol (ZYLOPRIM) 100 MG tablet Take 100 mg by mouth daily. Patient not taking: Reported on 06/29/2022    [provider]  aspirin 81 MG chewable tablet Chew by mouth daily.    [provider]  cetirizine (ZYRTEC) 10 MG tablet Take 10 mg by mouth at bedtime. Patient not taking: Reported on 06/29/2022    [provider]  clobetasol cream (TEMOVATE) 0.05 % Apply 1 application. topically 2 (two) times daily. Patient not taking: Reported on 06/29/2022 05/23/21   [provider]  clotrimazole-betamethasone (LOTRISONE) cream APPLY TO THE AFFECTED AREA(S) TOPICALLY TWICE DAILY 08/26/22   Dahlstedt,  Jeannett Senior, MD  cyclobenzaprine (FLEXERIL) 10 MG tablet Take 1 tablet (10 mg total) by mouth 3 (three) times daily as needed for muscle spasms. Patient not taking: Reported on 06/29/2022 06/28/21   Bethann Berkshire, MD  diazepam (VALIUM) 5 MG tablet Take 2 tabs 1 hr before MRI 06/29/22   Marcine Matar, MD  gabapentin (NEURONTIN) 400 MG capsule Take 400 mg by mouth 2 (two) times daily. Patient not taking: Reported on 06/29/2022 06/25/21   [provider]  Gluc-Chonn-MSM-Boswellia-Vit D (GLUCOSAMINE CHOND TRIPLE/VIT D PO) Take 2 tablets by mouth daily. Patient not taking: Reported on 06/29/2022    [provider]  indomethacin (INDOCIN) 25 MG capsule Take 25 mg by mouth 3 (three) times daily. 05/20/21   [provider]  loperamide (IMODIUM) 2 MG capsule Take 1 capsule (2 mg total) by mouth 2 (two) times daily. Patient not taking: Reported on 06/29/2022 04/02/15   Malissa Hippo, MD  meclizine (ANTIVERT) 25 MG tablet Take 25 mg by mouth 3 (three) times daily as needed for dizziness. Take 1/2 to 1 tablet 3 times a  day as needed for Dizziness Patient not taking: Reported on 06/29/2022    [provider]  Melatonin 5 MG CAPS Take by mouth.    [provider]  methocarbamol (ROBAXIN) 500 MG tablet Take 1 tablet (500 mg total) by mouth every 6 (six) hours as needed for muscle spasms. Patient not taking: Reported on 06/29/2022 05/03/17   Julien Girt, Alexzandrew L, PA-C  metoprolol succinate (TOPROL-XL) 25 MG 24 hr tablet Take 25 mg by mouth 2 (two) times daily. 09/01/20   [provider]  Multiple Vitamin (MULTIVITAMIN) tablet Take 1 tablet by mouth daily.    [provider]  oxyCODONE (OXY IR/ROXICODONE) 5 MG immediate release tablet Take 1-2 tablets (5-10 mg total) by mouth every 4 (four) hours as needed for moderate pain or severe pain. Patient not taking: Reported on 06/29/2022 05/03/17   Julien Girt, Alexzandrew L, PA-C  oxyCODONE-acetaminophen (PERCOCET) 5-325 MG tablet Take 1 tablet by mouth every 6 (six) hours as needed. Patient not taking: Reported on 06/29/2022 06/28/21   Bethann Berkshire, MD  rivaroxaban (XARELTO) 10 MG TABS tablet Take 1 tablet (10 mg total) by mouth daily with breakfast. Take Xarelto for two and a half more weeks following discharge from the hospital, then discontinue Xarelto. Once the patient has completed the Xarelto, they may resume the 81 mg Aspirin. Patient not taking: Reported on 06/29/2022 05/04/17   Julien Girt, Alexzandrew L, PA-C  rosuvastatin (CRESTOR) 5 MG tablet Take 5 mg by mouth daily.    [provider]  tamsulosin (FLOMAX) 0.4 MG CAPS capsule Take 1 capsule (0.4 mg total) by mouth daily after supper. Patient not taking: Reported on 06/29/2022 11/04/20   Marcine Matar, MD  triamcinolone cream (KENALOG) 0.1 % Apply 1 application topically 2 (two) times daily. Patient not taking: Reported on 06/29/2022 05/24/21   Eber Hong, MD  valACYclovir (VALTREX) 1000 MG tablet Take 1 tablet (1,000 mg total) by mouth 3 (three) times daily. Patient not taking:  Reported on 06/29/2022 05/24/21   Eber Hong, MD         JD [facility]sel[facility] 12/29/2022, 12:24 AM  Please see [email or web address] for pager details.  From 7A-7P if no response, please call [phone]. After hours, please call ELink [phone].

## 2022-12-30 LAB — URINE CULTURE: Culture: NO GROWTH

## 2022-12-30 NOTE — Discharge Summary (Signed)
    Against Medical Advice  Patient's bedside nurse reported that patient was very agitated and he signed out AMA paperwork and left the facility with his wife.  The IV line has been removed before he has left the hospital.  I was not able to talk with the patient in person as he left the hospital before I have reached to 3W.   Marcus Simpson expresses desire to leave the Hospital immediately. Patient has been warned by the bedside nursing staff that this is not medically advisable at this time, and can result in medical complications like Death and Disability. Patient understands and accepts the risks involved and assumes full responsibilty of this decision.  This patient has also been advised that if they feel the need for further medical assistance to return to any available emergency department or dial 9-1-1.  Informed by Nursing staff that this patient has left care and has signed the form  Against Medical Advice on 12/29/2022 at 9pm.  [name],MD [facility] [place]

## 2022-12-31 DIAGNOSIS — R52 Pain, unspecified: Secondary | ICD-10-CM | POA: Diagnosis not present

## 2022-12-31 DIAGNOSIS — I779 Disorder of arteries and arterioles, unspecified: Secondary | ICD-10-CM | POA: Diagnosis not present

## 2022-12-31 DIAGNOSIS — M545 Low back pain, unspecified: Secondary | ICD-10-CM | POA: Diagnosis not present

## 2022-12-31 DIAGNOSIS — N12 Tubulo-interstitial nephritis, not specified as acute or chronic: Secondary | ICD-10-CM | POA: Diagnosis not present

## 2022-12-31 DIAGNOSIS — I10 Essential (primary) hypertension: Secondary | ICD-10-CM | POA: Diagnosis not present

## 2022-12-31 DIAGNOSIS — Z09 Encounter for follow-up examination after completed treatment for conditions other than malignant neoplasm: Secondary | ICD-10-CM | POA: Diagnosis not present

## 2023-01-02 LAB — CULTURE, BLOOD (ROUTINE X 2)
Culture: NO GROWTH
Culture: NO GROWTH
Special Requests: ADEQUATE

## 2023-01-03 DIAGNOSIS — I10 Essential (primary) hypertension: Secondary | ICD-10-CM | POA: Diagnosis not present

## 2023-01-03 DIAGNOSIS — N12 Tubulo-interstitial nephritis, not specified as acute or chronic: Secondary | ICD-10-CM | POA: Diagnosis not present

## 2023-01-03 DIAGNOSIS — B370 Candidal stomatitis: Secondary | ICD-10-CM | POA: Diagnosis not present

## 2023-01-03 DIAGNOSIS — Z299 Encounter for prophylactic measures, unspecified: Secondary | ICD-10-CM | POA: Diagnosis not present

## 2023-01-06 ENCOUNTER — Telehealth: Payer: Self-pay

## 2023-01-06 NOTE — Telephone Encounter (Signed)
Marcus Simpson with Dr. Shannan Harper office called, patient prefers to have surgery with you at [facility] since it is closer to him.  He had a stent placed on 10/08 with Dr. Alvester Morin and they were planning to do a ureteroscopy stent exchange.  Patient is requesting you perform the surgery.  Since he is already an established patient here will you need to see him in office prior to scheduling?  Please advise and please provide posting sheet when you are back in office.  Thank you .

## 2023-01-07 NOTE — Telephone Encounter (Signed)
Unable to reach patient by phone or leave VM.  Will call back at a later time.

## 2023-01-10 NOTE — Telephone Encounter (Signed)
Patient is unsure what he wants to do at this point.  He states he would like to think about it and call our office back with a decision.

## 2023-01-13 NOTE — Plan of Care (Signed)
[facility] Tonsillectomy/Adenoidectomy, Postoperative PEDS care plan entered in error.

## 2023-01-13 NOTE — Telephone Encounter (Signed)
Pt scheduled for OV to discuss surgery with MD in person

## 2023-01-18 ENCOUNTER — Ambulatory Visit: Payer: Medicare Other | Admitting: Urology

## 2023-01-18 ENCOUNTER — Ambulatory Visit (HOSPITAL_COMMUNITY)
Admission: RE | Admit: 2023-01-18 | Discharge: 2023-01-18 | Disposition: A | Discharge status: Home / Self Care | Payer: Medicare Other | Source: Ambulatory Visit | Attending: Urology | Admitting: Urology

## 2023-01-18 VITALS — BP 152/73 | HR 66

## 2023-01-18 DIAGNOSIS — C61 Malignant neoplasm of prostate: Secondary | ICD-10-CM | POA: Diagnosis not present

## 2023-01-18 DIAGNOSIS — N481 Balanitis: Secondary | ICD-10-CM | POA: Diagnosis not present

## 2023-01-18 DIAGNOSIS — N200 Calculus of kidney: Secondary | ICD-10-CM

## 2023-01-18 DIAGNOSIS — N201 Calculus of ureter: Secondary | ICD-10-CM

## 2023-01-18 DIAGNOSIS — N471 Phimosis: Secondary | ICD-10-CM | POA: Diagnosis not present

## 2023-01-18 DIAGNOSIS — Z960 Presence of urogenital implants: Secondary | ICD-10-CM | POA: Diagnosis not present

## 2023-01-18 LAB — URINALYSIS, ROUTINE W REFLEX MICROSCOPIC
Bilirubin, UA: NEGATIVE
Glucose, UA: NEGATIVE
Ketones, UA: NEGATIVE
Nitrite, UA: NEGATIVE
Specific Gravity, UA: 1.01 (ref 1.005–1.030)
Urobilinogen, Ur: 0.2 mg/dL (ref 0.2–1.0)
pH, UA: 6 (ref 5.0–7.5)

## 2023-01-18 LAB — MICROSCOPIC EXAMINATION
RBC, Urine: >30 /[HPF] — AB (ref 0–2)
WBC, UA: >30 /[HPF] — AB (ref 0–5)

## 2023-01-18 NOTE — Progress Notes (Signed)
History of Present Illness:  : 10.14.2016: TRUS/Bx.  PSA 5.9, prostate volume 41 mL, PSA density 0.14.  4/12 cores positive, all in the right apical and mid prostate, all with GG 1 pathology. Strong family history of prostate cancer.  Active surveillance chosen.   9.25.2018: Prostate MRI-2 cm PI-RADS 5 lesion in left transition zone. 11.16.2018: Fusion biopsy.  Volume 42 mL.  4 cores were taken from the region of interest.  3 of these revealed GG 1 pattern, 1 was benign.  2/12 systematic cores-from the left mid medial and left apical medial also revealed GG 1 pattern.   7.7.2020: PSA 6.1.  He was to have been scheduled for repeat prostate MRI with possible fusion biopsy in 6 months.  Neither of these was performed.   7.5.2022: He apparently had a PSA drawn within the past month with Dr. Sherril Croon.  He states that it was 7.0.  He has had no bony pain.  He has no significant lower urinary tract symptoms that are overall stable.  He has had recent intermittent gross painless hematuria.  He really does not have any significant history of cigarette smoking.   8.16.2022:  He did have a CT scan on 7/18 revealing:  IMPRESSION: 1. Numerous bilateral nonobstructive renal calculi. 2. Urinary bladder wall thickening may partially be due to nondistention although a component of cystitis cannot be excluded. 3. Bilateral benign-appearing renal cysts. Some of the hypodense right renal lesions of the left kidney are technically too small to characterize. 4. Potential morphologic indicators of early cirrhosis in the liver. 5. Multilevel lumbar impingement. 6. 60 cubic mm left lower lobe subpleural nodule on image 6 series 15. No follow-up needed if patient is low-risk. Non-contrast chest CT can be considered in 12 months if patient is high-risk.  8. 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  Cystoscopy revealed bilobar hyperplasia with obstruction.  Normal  bladder.  5.22.2023: PSA 7.0  4.9.2024: PSA 6.1  10.29.2024: He is here today for follow-up.  Underwent urgent stent placement for a right ureteral stone with obstruction and sepsis on the ninth of this month.  He has had no stent related symptoms.  He has had no gross hematuria or dysuria.   Past Medical History:  Diagnosis Date   Arthritis    Bradycardia    asymptomatic    History of kidney stones    Hypertension    Prostate cancer [facility])     Past Surgical History:  Procedure Laterality Date   COLONOSCOPY     CYSTOSCOPY WITH STENT PLACEMENT Right 12/28/2022   Procedure: CYSTOSCOPY WITH STENT PLACEMENT;  Surgeon: Crista Elliot, MD;  Location: [facility] OR;  Service: Urology;  Laterality: Right;   FLEXIBLE SIGMOIDOSCOPY N/A 04/02/2015   Procedure: FLEXIBLE SIGMOIDOSCOPY;  Surgeon: Malissa Hippo, MD;  [facility];  Service: Endoscopy;  Laterality: N/A;  1:45 - moved to 1/11 @ 8:30 - Ann to notify   KIDNEY STONE SURGERY     TOTAL KNEE ARTHROPLASTY Left 05/02/2017   Procedure: LEFT TOTAL KNEE ARTHROPLASTY;  Surgeon: Ollen Gross, MD;  Location: [facility] ORS;  Service: Orthopedics;  Laterality: Left;    Home Medications:  Allergies as of 01/18/2023       Reactions   Dilaudid [hydromorphone Hcl] Anaphylaxis   Hydromorphone Other (See Comments)   Other reaction(s): Irregular heart rate        Medication List        Accurate as of January 18, 2023  8:28 AM. If you have any questions, ask your nurse or doctor.          albuterol 108 (90 Base) MCG/ACT inhaler Commonly known as: VENTOLIN HFA Inhale 2 puffs into the lungs every 6 (six) hours as needed for wheezing or shortness of breath.   allopurinol 100 MG tablet Commonly known as: ZYLOPRIM Take 100 mg by mouth daily.   amLODipine 5 MG tablet Commonly known as: NORVASC Take 1 tablet by mouth daily.   aspirin 81 MG chewable tablet Chew by mouth daily.   cetirizine 10 MG tablet Commonly known as:  ZYRTEC Take 10 mg by mouth at bedtime.   clobetasol cream 0.05 % Commonly known as: TEMOVATE Apply 1 application. topically 2 (two) times daily.   clotrimazole-betamethasone cream Commonly known as: LOTRISONE APPLY TO THE AFFECTED AREA(S) TOPICALLY TWICE DAILY What changed: See the new instructions.   cyclobenzaprine 10 MG tablet Commonly known as: FLEXERIL Take 1 tablet (10 mg total) by mouth 3 (three) times daily as needed for muscle spasms.   diazepam 5 MG tablet Commonly known as: Valium Take 2 tabs 1 hr before MRI   gabapentin 400 MG capsule Commonly known as: NEURONTIN Take 400 mg by mouth 2 (two) times daily.   glipiZIDE 2.5 MG 24 hr tablet Commonly known as: GLUCOTROL XL Take 2.5 mg by mouth daily.   GLUCOSAMINE CHOND TRIPLE/VIT D PO Take 2 tablets by mouth daily.   indomethacin 25 MG capsule Commonly known as: INDOCIN Take 25 mg by mouth 3 (three) times daily.   loperamide 2 MG capsule Commonly known as: IMODIUM Take 1 capsule (2 mg total) by mouth 2 (two) times daily.   meclizine 25 MG tablet Commonly known as: ANTIVERT Take 25 mg by mouth 3 (three) times daily as needed for dizziness. Take 1/2 to 1 tablet 3 times a day as needed for Dizziness   Melatonin 5 MG Caps Take by mouth.   methocarbamol 500 MG tablet Commonly known as: ROBAXIN Take 1 tablet (500 mg total) by mouth every 6 (six) hours as needed for muscle spasms.   metoprolol succinate 25 MG 24 hr tablet Commonly known as: TOPROL-XL Take 25 mg by mouth 2 (two) times daily.   multivitamin tablet Take 1 tablet by mouth daily.   oxyCODONE 5 MG immediate release tablet Commonly known as: Oxy IR/ROXICODONE Take 1-2 tablets (5-10 mg total) by mouth every 4 (four) hours as needed for moderate pain or severe pain.   oxyCODONE-acetaminophen 5-325 MG tablet Commonly known as: Percocet Take 1 tablet by mouth every 6 (six) hours as needed.   rivaroxaban 10 MG Tabs tablet Commonly known as:  XARELTO Take 1 tablet (10 mg total) by mouth daily with breakfast. Take Xarelto for two and a half more weeks following discharge from the hospital, then discontinue Xarelto. Once the patient has completed the Xarelto, they may resume the 81 mg Aspirin.   rosuvastatin 5 MG tablet Commonly known as: CRESTOR Take 5 mg by mouth daily.   tamsulosin 0.4 MG Caps capsule Commonly known as: FLOMAX Take 1 capsule (0.4 mg total) by mouth daily after supper.   triamcinolone cream 0.1 % Commonly known as: KENALOG Apply 1 application topically 2 (two) times daily.   valACYclovir 1000 MG tablet Commonly known as: VALTREX Take 1 tablet (1,000 mg total) by mouth 3 (three) times daily.        Allergies:  Allergies  Allergen Reactions   Dilaudid [Hydromorphone Hcl] Anaphylaxis  Hydromorphone Other (See Comments)    Other reaction(s): Irregular heart rate    No family history on file.  Social History:  reports that he has quit smoking. His smoking use included cigarettes. He has never used smokeless tobacco. He reports that he does not drink alcohol and does not use drugs.  ROS: A complete review of systems was performed.  All systems are negative except for pertinent findings as noted.  Physical Exam:  Vital signs in last 24 hours: There were no vitals taken for this visit. Constitutional:  Alert and oriented, No acute distress Cardiovascular: Regular rate  Respiratory: Normal respiratory effort Genitourinary: Phallus uncircumcised, phimosis/balanitis present, testes are descended bilaterally and non-tender and without masses, scrotum is normal in appearance without lesions or masses, perineum is normal on inspection. Lymphatic: No lymphadenopathy Neurologic: Grossly intact, no focal deficits Psychiatric: Normal mood and affect  I have reviewed prior pt notes  I have reviewed urinalysis results  I have independently reviewed prior imaging--CT scan, MRI scan results.  Most recent  CT from earlier this month reviewed-images with patient/wife  Hospital records reviewed      Impression/Assessment:  Recent right ureteral stone/UTI, stented    Plan:  I explained alternative treatments with the patient/wife.  ESL versus ureteroscopic management with laser.  Side effects, treatment success rate with each discussed.  He would like to proceed with shockwave lithotripsy.  Stone can be seen on scout film.  We will check KUB today, proceed with lithotripsy in the near future.

## 2023-01-24 ENCOUNTER — Other Ambulatory Visit: Payer: Self-pay

## 2023-01-24 ENCOUNTER — Encounter (HOSPITAL_COMMUNITY)
Admission: RE | Admit: 2023-01-24 | Discharge: 2023-01-24 | Disposition: A | Discharge status: Home / Self Care | Payer: Medicare Other | Source: Ambulatory Visit | Attending: Urology | Admitting: Urology

## 2023-01-24 ENCOUNTER — Encounter (HOSPITAL_COMMUNITY): Payer: Self-pay

## 2023-01-24 DIAGNOSIS — N200 Calculus of kidney: Secondary | ICD-10-CM

## 2023-01-24 MED ORDER — CIPROFLOXACIN HCL 250 MG PO TABS
500.0000 mg | ORAL_TABLET | ORAL | Status: AC
  Administered 2023-01-25: 500 mg via ORAL
  Filled 2023-01-24: qty 1

## 2023-01-25 ENCOUNTER — Encounter (HOSPITAL_COMMUNITY): Payer: Self-pay | Admitting: Urology

## 2023-01-25 ENCOUNTER — Encounter: Payer: Self-pay | Admitting: Urology

## 2023-01-25 ENCOUNTER — Ambulatory Visit (HOSPITAL_COMMUNITY)
Admission: RE | Admit: 2023-01-25 | Discharge: 2023-01-25 | Disposition: A | Discharge status: Home / Self Care | Payer: Medicare Other | Attending: Urology | Admitting: Urology

## 2023-01-25 ENCOUNTER — Ambulatory Visit (HOSPITAL_COMMUNITY): Payer: Medicare Other

## 2023-01-25 ENCOUNTER — Encounter (HOSPITAL_COMMUNITY)
Admission: RE | Disposition: A | Discharge status: Home / Self Care | Payer: Self-pay | Source: Home / Self Care | Attending: Urology

## 2023-01-25 DIAGNOSIS — E119 Type 2 diabetes mellitus without complications: Secondary | ICD-10-CM | POA: Insufficient documentation

## 2023-01-25 DIAGNOSIS — I10 Essential (primary) hypertension: Secondary | ICD-10-CM | POA: Insufficient documentation

## 2023-01-25 DIAGNOSIS — N201 Calculus of ureter: Secondary | ICD-10-CM | POA: Insufficient documentation

## 2023-01-25 DIAGNOSIS — Z7982 Long term (current) use of aspirin: Secondary | ICD-10-CM | POA: Diagnosis not present

## 2023-01-25 DIAGNOSIS — N132 Hydronephrosis with renal and ureteral calculous obstruction: Secondary | ICD-10-CM

## 2023-01-25 DIAGNOSIS — Z452 Encounter for adjustment and management of vascular access device: Secondary | ICD-10-CM | POA: Diagnosis not present

## 2023-01-25 DIAGNOSIS — N200 Calculus of kidney: Secondary | ICD-10-CM | POA: Diagnosis not present

## 2023-01-25 DIAGNOSIS — N202 Calculus of kidney with calculus of ureter: Secondary | ICD-10-CM | POA: Diagnosis not present

## 2023-01-25 DIAGNOSIS — Z7901 Long term (current) use of anticoagulants: Secondary | ICD-10-CM | POA: Diagnosis not present

## 2023-01-25 HISTORY — PX: EXTRACORPOREAL SHOCK WAVE LITHOTRIPSY: SHX1557

## 2023-01-25 LAB — GLUCOSE, CAPILLARY: Glucose-Capillary: 110 mg/dL — ABNORMAL HIGH (ref 70–99)

## 2023-01-25 SURGERY — LITHOTRIPSY, ESWL
Anesthesia: LOCAL | Laterality: Right

## 2023-01-25 MED ORDER — DIPHENHYDRAMINE HCL 25 MG PO CAPS
25.0000 mg | ORAL_CAPSULE | ORAL | Status: AC
  Administered 2023-01-25: 25 mg via ORAL
  Filled 2023-01-25: qty 1

## 2023-01-25 MED ORDER — DIAZEPAM 5 MG PO TABS
10.0000 mg | ORAL_TABLET | Freq: Once | ORAL | Status: AC
  Administered 2023-01-25: 10 mg via ORAL
  Filled 2023-01-25: qty 2

## 2023-01-25 NOTE — Op Note (Signed)
See [facility] OP note scanned into chart. 

## 2023-01-25 NOTE — Discharge Instructions (Addendum)
See [facility] OP note scanned into chart. 

## 2023-01-25 NOTE — H&P (Signed)
H&P  Chief Complaint: Right kidney stone  History of Present Illness: 77 year old male presents at this time for shockwave lithotripsy of a right mid/distal ureteral stone, previously stented for obstruction with infection.  Past Medical History:  Diagnosis Date   Arthritis    Bradycardia    asymptomatic    History of kidney stones    Hypertension    Prostate cancer [facility])     Past Surgical History:  Procedure Laterality Date   COLONOSCOPY     CYSTOSCOPY WITH STENT PLACEMENT Right 12/28/2022   Procedure: CYSTOSCOPY WITH STENT PLACEMENT;  Surgeon: Crista Elliot, MD;  Location: [facility] OR;  Service: Urology;  Laterality: Right;   FLEXIBLE SIGMOIDOSCOPY N/A 04/02/2015   Procedure: FLEXIBLE SIGMOIDOSCOPY;  Surgeon: Malissa Hippo, MD;  [facility];  Service: Endoscopy;  Laterality: N/A;  1:45 - moved to 1/11 @ 8:30 - Ann to notify   KIDNEY STONE SURGERY     TOTAL KNEE ARTHROPLASTY Left 05/02/2017   Procedure: LEFT TOTAL KNEE ARTHROPLASTY;  Surgeon: Ollen Gross, MD;  Location: [facility];  Service: Orthopedics;  Laterality: Left;    Home Medications:    Allergies:  Allergies  Allergen Reactions   Dilaudid [Hydromorphone Hcl] Anaphylaxis   Hydromorphone Other (See Comments)    Other reaction(s): Irregular heart rate    History reviewed. No pertinent family history.  Social History:  reports that he has quit smoking. His smoking use included cigarettes. He has never used smokeless tobacco. He reports that he does not drink alcohol and does not use drugs.  ROS: A complete review of systems was performed.  All systems are negative except for pertinent findings as noted.  Physical Exam:  Vital signs in last 24 hours: There were no vitals taken for this visit. Constitutional:  Alert and oriented, No acute distress Cardiovascular: Regular rate  Respiratory: Normal respiratory effort Lymphatic: No lymphadenopathy Neurologic: Grossly intact, no focal  deficits Psychiatric: Normal mood and affect  I have reviewed prior pt notes  I have reviewed urinalysis results  I have independently reviewed prior imaging-KUB and prior CT scan.  Skin to stone distance 19 cm, Hounsfield units 1000    Impression/Assessment:  12 mm right mid/distal ureteral stone, previously stented  Plan:  ESWL.  The patient understands that this is the first part of a possible staged procedure

## 2023-01-27 ENCOUNTER — Encounter (HOSPITAL_COMMUNITY): Payer: Self-pay | Admitting: Urology

## 2023-02-07 NOTE — Progress Notes (Signed)
History of Present Illness: Is here today for follow-up after shockwave lithotripsy of a right ureteral stone 2 weeks ago.  Past Medical History:  Diagnosis Date   Arthritis    Bradycardia    asymptomatic    History of kidney stones    Hypertension    Prostate cancer [facility])     Past Surgical History:  Procedure Laterality Date   COLONOSCOPY     CYSTOSCOPY WITH STENT PLACEMENT Right 12/28/2022   Procedure: CYSTOSCOPY WITH STENT PLACEMENT;  Surgeon: Crista Elliot, MD;  Location: [facility] OR;  Service: Urology;  Laterality: Right;   EXTRACORPOREAL SHOCK WAVE LITHOTRIPSY Right 01/25/2023   Procedure: EXTRACORPOREAL SHOCK WAVE LITHOTRIPSY (ESWL);  Surgeon: Marcine Matar, MD;  Location: [facility];  Service: Urology;  Laterality: Right;   FLEXIBLE SIGMOIDOSCOPY N/A 04/02/2015   Procedure: FLEXIBLE SIGMOIDOSCOPY;  Surgeon: Malissa Hippo, MD;  [facility];  Service: Endoscopy;  Laterality: N/A;  1:45 - moved to 1/11 @ 8:30 - Ann to notify   KIDNEY STONE SURGERY     TOTAL KNEE ARTHROPLASTY Left 05/02/2017   Procedure: LEFT TOTAL KNEE ARTHROPLASTY;  Surgeon: Ollen Gross, MD;  Location: [facility];  Service: Orthopedics;  Laterality: Left;    Home Medications:  Allergies as of 02/08/2023       Reactions   Dilaudid [hydromorphone Hcl] Anaphylaxis   Hydromorphone Other (See Comments)   Other reaction(s): Irregular heart rate        Medication List        Accurate as of February 07, 2023 10:04 AM. If you have any questions, ask your nurse or doctor.          albuterol 108 (90 Base) MCG/ACT inhaler Commonly known as: VENTOLIN HFA Inhale 2 puffs into the lungs every 6 (six) hours as needed for wheezing or shortness of breath.   allopurinol 100 MG tablet Commonly known as: ZYLOPRIM Take 100 mg by mouth daily.   amLODipine 5 MG tablet Commonly known as: NORVASC Take 1 tablet by mouth daily.   aspirin 81 MG chewable tablet Chew by mouth daily.   cetirizine 10 MG  tablet Commonly known as: ZYRTEC Take 10 mg by mouth at bedtime.   clobetasol cream 0.05 % Commonly known as: TEMOVATE Apply 1 application. topically 2 (two) times daily.   clotrimazole-betamethasone cream Commonly known as: LOTRISONE APPLY TO THE AFFECTED AREA(S) TOPICALLY TWICE DAILY What changed: See the new instructions.   cyclobenzaprine 10 MG tablet Commonly known as: FLEXERIL Take 1 tablet (10 mg total) by mouth 3 (three) times daily as needed for muscle spasms.   diazepam 5 MG tablet Commonly known as: Valium Take 2 tabs 1 hr before MRI   gabapentin 400 MG capsule Commonly known as: NEURONTIN Take 400 mg by mouth 2 (two) times daily.   glipiZIDE 2.5 MG 24 hr tablet Commonly known as: GLUCOTROL XL Take 2.5 mg by mouth daily.   GLUCOSAMINE CHOND TRIPLE/VIT D PO Take 2 tablets by mouth daily.   indomethacin 25 MG capsule Commonly known as: INDOCIN Take 25 mg by mouth 3 (three) times daily.   loperamide 2 MG capsule Commonly known as: IMODIUM Take 1 capsule (2 mg total) by mouth 2 (two) times daily.   meclizine 25 MG tablet Commonly known as: ANTIVERT Take 25 mg by mouth 3 (three) times daily as needed for dizziness. Take 1/2 to 1 tablet 3 times a day as needed for Dizziness   Melatonin 5 MG Caps Take by mouth.  methocarbamol 500 MG tablet Commonly known as: ROBAXIN Take 1 tablet (500 mg total) by mouth every 6 (six) hours as needed for muscle spasms.   metoprolol succinate 25 MG 24 hr tablet Commonly known as: TOPROL-XL Take 25 mg by mouth 2 (two) times daily.   multivitamin tablet Take 1 tablet by mouth daily.   oxyCODONE 5 MG immediate release tablet Commonly known as: Oxy IR/ROXICODONE Take 1-2 tablets (5-10 mg total) by mouth every 4 (four) hours as needed for moderate pain or severe pain.   oxyCODONE-acetaminophen 5-325 MG tablet Commonly known as: Percocet Take 1 tablet by mouth every 6 (six) hours as needed.   rivaroxaban 10 MG Tabs  tablet Commonly known as: XARELTO Take 1 tablet (10 mg total) by mouth daily with breakfast. Take Xarelto for two and a half more weeks following discharge from the hospital, then discontinue Xarelto. Once the patient has completed the Xarelto, they may resume the 81 mg Aspirin.   rosuvastatin 5 MG tablet Commonly known as: CRESTOR Take 5 mg by mouth daily.   tamsulosin 0.4 MG Caps capsule Commonly known as: FLOMAX Take 1 capsule (0.4 mg total) by mouth daily after supper.   triamcinolone cream 0.1 % Commonly known as: KENALOG Apply 1 application topically 2 (two) times daily.   valACYclovir 1000 MG tablet Commonly known as: VALTREX Take 1 tablet (1,000 mg total) by mouth 3 (three) times daily.        Allergies:  Allergies  Allergen Reactions   Dilaudid [Hydromorphone Hcl] Anaphylaxis   Hydromorphone Other (See Comments)    Other reaction(s): Irregular heart rate    No family history on file.  Social History:  reports that he has quit smoking. His smoking use included cigarettes. He has never used smokeless tobacco. He reports that he does not drink alcohol and does not use drugs.  ROS: A complete review of systems was performed.  All systems are negative except for pertinent findings as noted.  Physical Exam:  Vital signs in last 24 hours: There were no vitals taken for this visit. Constitutional:  Alert and oriented, No acute distress Cardiovascular: Regular rate  Respiratory: Normal respiratory effort Neurologic: Grossly intact, no focal deficits Psychiatric: Normal mood and affect  I have reviewed prior pt notes  I have reviewed notes from referring/previous physicians  I have reviewed urinalysis results  I have independently reviewed prior imaging  Cystoscopy Procedure Note:  Indication: Stent removal  After informed consent and discussion of the procedure and its risks, Montero Monce Malson was positioned and prepped in the standard fashion.   Cystoscopy was performed with a flexible cystoscope.   Findings: Urethra: Normal Bladder: Urothelium normal.  Right ureteral stent was visualized, grasped and extracted intact.  The patient tolerated the procedure well.      Impression/Assessment:  Right ureteral calculus, status post shockwave lithotripsy with adequate fragmentation, no residual fragments noted  Plan:  I will see him back in 3 months for recheck

## 2023-02-08 ENCOUNTER — Ambulatory Visit: Payer: Medicare Other | Admitting: Urology

## 2023-02-08 ENCOUNTER — Encounter: Payer: Medicare Other | Admitting: Urology

## 2023-02-08 ENCOUNTER — Ambulatory Visit (HOSPITAL_COMMUNITY)
Admission: RE | Admit: 2023-02-08 | Discharge: 2023-02-08 | Disposition: A | Discharge status: Home / Self Care | Payer: Medicare Other | Source: Ambulatory Visit | Attending: Urology | Admitting: Urology

## 2023-02-08 VITALS — BP 170/88 | HR 51

## 2023-02-08 DIAGNOSIS — Z466 Encounter for fitting and adjustment of urinary device: Secondary | ICD-10-CM | POA: Diagnosis not present

## 2023-02-08 DIAGNOSIS — N200 Calculus of kidney: Secondary | ICD-10-CM

## 2023-02-08 DIAGNOSIS — N471 Phimosis: Secondary | ICD-10-CM

## 2023-02-08 DIAGNOSIS — C61 Malignant neoplasm of prostate: Secondary | ICD-10-CM

## 2023-02-08 LAB — URINALYSIS, ROUTINE W REFLEX MICROSCOPIC
Bilirubin, UA: NEGATIVE
Glucose, UA: NEGATIVE
Ketones, UA: NEGATIVE
Nitrite, UA: NEGATIVE
Specific Gravity, UA: 1.01 (ref 1.005–1.030)
Urobilinogen, Ur: 0.2 mg/dL (ref 0.2–1.0)
pH, UA: 6.5 (ref 5.0–7.5)

## 2023-02-08 LAB — MICROSCOPIC EXAMINATION
Bacteria, UA: NONE SEEN
RBC, Urine: >30 /[HPF] — AB (ref 0–2)

## 2023-02-08 MED ORDER — CLOTRIMAZOLE-BETAMETHASONE 1-0.05 % EX CREA
1.0000 | TOPICAL_CREAM | Freq: Two times a day (BID) | CUTANEOUS | 1 refills | Status: DC
Start: 2023-02-08 — End: 2023-10-11

## 2023-03-01 ENCOUNTER — Ambulatory Visit: Payer: Medicare Other | Admitting: Urology

## 2023-03-08 DIAGNOSIS — M159 Polyosteoarthritis, unspecified: Secondary | ICD-10-CM | POA: Diagnosis not present

## 2023-03-08 DIAGNOSIS — I10 Essential (primary) hypertension: Secondary | ICD-10-CM | POA: Diagnosis not present

## 2023-03-08 DIAGNOSIS — I152 Hypertension secondary to endocrine disorders: Secondary | ICD-10-CM | POA: Diagnosis not present

## 2023-03-08 DIAGNOSIS — E1159 Type 2 diabetes mellitus with other circulatory complications: Secondary | ICD-10-CM | POA: Diagnosis not present

## 2023-03-08 DIAGNOSIS — Z299 Encounter for prophylactic measures, unspecified: Secondary | ICD-10-CM | POA: Diagnosis not present

## 2023-03-08 DIAGNOSIS — R0789 Other chest pain: Secondary | ICD-10-CM | POA: Diagnosis not present

## 2023-04-22 ENCOUNTER — Telehealth: Payer: Self-pay

## 2023-04-22 NOTE — Telephone Encounter (Signed)
Called Pt to let him know MD wants to have another PSA completed and the order is already in. Appt was scheduled for 02/19 @ 1PM Pt verfied he would be there

## 2023-05-09 DIAGNOSIS — M722 Plantar fascial fibromatosis: Secondary | ICD-10-CM | POA: Diagnosis not present

## 2023-05-09 DIAGNOSIS — M79671 Pain in right foot: Secondary | ICD-10-CM | POA: Diagnosis not present

## 2023-05-09 DIAGNOSIS — M7732 Calcaneal spur, left foot: Secondary | ICD-10-CM | POA: Diagnosis not present

## 2023-05-09 DIAGNOSIS — E1140 Type 2 diabetes mellitus with diabetic neuropathy, unspecified: Secondary | ICD-10-CM | POA: Diagnosis not present

## 2023-05-09 DIAGNOSIS — M79672 Pain in left foot: Secondary | ICD-10-CM | POA: Diagnosis not present

## 2023-05-09 DIAGNOSIS — M7731 Calcaneal spur, right foot: Secondary | ICD-10-CM | POA: Diagnosis not present

## 2023-05-11 ENCOUNTER — Other Ambulatory Visit: Payer: Medicare Other

## 2023-05-11 DIAGNOSIS — C61 Malignant neoplasm of prostate: Secondary | ICD-10-CM

## 2023-05-12 LAB — PSA: Prostate Specific Ag, Serum: 6.6 ng/mL — ABNORMAL HIGH (ref 0.0–4.0)

## 2023-05-16 NOTE — Progress Notes (Signed)
 History of Present Illness:  : 10.14.2016: TRUS/Bx.  PSA 5.9, prostate volume 41 mL, PSA density 0.14.  4/12 cores positive, all in the right apical and mid prostate, all with GG 1 pathology. Strong family history of prostate cancer.  Active surveillance chosen.   9.25.2018: Prostate MRI-2 cm PI-RADS 5 lesion in left transition zone. 11.16.2018: Fusion biopsy.  Volume 42 mL.  4 cores were taken from the region of interest.  3 of these revealed GG 1 pattern, 1 was benign.  2/12 systematic cores-from the left mid medial and left apical medial also revealed GG 1 pattern.   7.7.2020: PSA 6.1.  He was to have been scheduled for repeat prostate MRI with possible fusion biopsy in 6 months.  Neither of these was performed.   7.5.2022: He apparently had a PSA drawn within the past month with Dr. Sherril Croon.  He states that it was 7.0.  He has had no bony pain.  He has no significant lower urinary tract symptoms that are overall stable.  He has had recent intermittent gross painless hematuria.  He really does not have any significant history of cigarette smoking.   8.16.2022:  He did have a CT scan on 7/18 revealing:  IMPRESSION: 1. Numerous bilateral nonobstructive renal calculi. 2. Urinary bladder wall thickening may partially be due to nondistention although a component of cystitis cannot be excluded. 3. Bilateral benign-appearing renal cysts. Some of the hypodense right renal lesions of the left kidney are technically too small to characterize. 4. Potential morphologic indicators of early cirrhosis in the liver. 5. Multilevel lumbar impingement. 6. 60 cubic mm left lower lobe subpleural nodule on image 6 series 15. No follow-up needed if patient is low-risk. Non-contrast chest CT can be considered in 12 months if patient is high-risk.  8. 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  Cystoscopy revealed bilobar hyperplasia with obstruction.  Normal  bladder.  5.22.2023: PSA 7.0  4.9.2024: PSA 6.1.  Prostate MRI ordered.  10.9.2024:   Underwent urgent stent placement for a right ureteral stone with obstruction and sepsis   11.5.2024: ESL of ureteral stone. Stent subsequently removed 11.19.2024  2.25.2025: Here today for recheck.  His MRI is still not been performed.  He is having no issues since his stent removal.  No blood in urine, no dysuria.  Certainly no flank pain.   Past Medical History:  Diagnosis Date   Arthritis    Bradycardia    asymptomatic    History of kidney stones    Hypertension    Prostate cancer [facility])     Past Surgical History:  Procedure Laterality Date   COLONOSCOPY     CYSTOSCOPY WITH STENT PLACEMENT Right 12/28/2022   Procedure: CYSTOSCOPY WITH STENT PLACEMENT;  Surgeon: Crista Elliot, MD;  Location: [facility] OR;  Service: Urology;  Laterality: Right;   EXTRACORPOREAL SHOCK WAVE LITHOTRIPSY Right 01/25/2023   Procedure: EXTRACORPOREAL SHOCK WAVE LITHOTRIPSY (ESWL);  Surgeon: Marcine Matar, MD;  Location: [facility] ORS;  Service: Urology;  Laterality: Right;   FLEXIBLE SIGMOIDOSCOPY N/A 04/02/2015   Procedure: FLEXIBLE SIGMOIDOSCOPY;  Surgeon: Malissa Hippo, MD;  Location: [facility] ENDO SUITE;  Service: Endoscopy;  Laterality: N/A;  1:45 - moved to 1/11 @ 8:30 - Ann to notify   KIDNEY STONE SURGERY     TOTAL KNEE ARTHROPLASTY Left 05/02/2017   Procedure: LEFT TOTAL KNEE ARTHROPLASTY;  Surgeon: Ollen Gross, MD;  Location: [facility] ORS;  Service: Orthopedics;  Laterality: Left;  Home Medications:  Allergies as of 05/17/2023       Reactions   Dilaudid [hydromorphone Hcl] Anaphylaxis   Hydromorphone Other (See Comments)   Other reaction(s): Irregular heart rate        Medication List        Accurate as of May 16, 2023 12:33 PM. If you have any questions, ask your nurse or doctor.          albuterol 108 (90 Base) MCG/ACT inhaler Commonly known as: VENTOLIN HFA Inhale 2 puffs into the lungs  every 6 (six) hours as needed for wheezing or shortness of breath.   allopurinol 100 MG tablet Commonly known as: ZYLOPRIM Take 100 mg by mouth daily.   amLODipine 5 MG tablet Commonly known as: NORVASC Take 1 tablet by mouth daily.   aspirin 81 MG chewable tablet Chew by mouth daily.   cetirizine 10 MG tablet Commonly known as: ZYRTEC Take 10 mg by mouth at bedtime.   clobetasol cream 0.05 % Commonly known as: TEMOVATE Apply 1 application  topically 2 (two) times daily.   clotrimazole-betamethasone cream Commonly known as: LOTRISONE Apply 1 Application topically 2 (two) times daily.   cyclobenzaprine 10 MG tablet Commonly known as: FLEXERIL Take 1 tablet (10 mg total) by mouth 3 (three) times daily as needed for muscle spasms.   diazepam 5 MG tablet Commonly known as: Valium Take 2 tabs 1 hr before MRI   gabapentin 400 MG capsule Commonly known as: NEURONTIN Take 400 mg by mouth 2 (two) times daily.   glipiZIDE 2.5 MG 24 hr tablet Commonly known as: GLUCOTROL XL Take 2.5 mg by mouth daily.   GLUCOSAMINE CHOND TRIPLE/VIT D PO Take 2 tablets by mouth daily.   indomethacin 25 MG capsule Commonly known as: INDOCIN Take 25 mg by mouth 3 (three) times daily.   loperamide 2 MG capsule Commonly known as: IMODIUM Take 1 capsule (2 mg total) by mouth 2 (two) times daily.   meclizine 25 MG tablet Commonly known as: ANTIVERT Take 25 mg by mouth 3 (three) times daily as needed for dizziness. Take 1/2 to 1 tablet 3 times a day as needed for Dizziness   Melatonin 5 MG Caps Take by mouth.   methocarbamol 500 MG tablet Commonly known as: ROBAXIN Take 1 tablet (500 mg total) by mouth every 6 (six) hours as needed for muscle spasms.   metoprolol succinate 25 MG 24 hr tablet Commonly known as: TOPROL-XL Take 25 mg by mouth 2 (two) times daily.   multivitamin tablet Take 1 tablet by mouth daily.   oxyCODONE 5 MG immediate release tablet Commonly known as: Oxy  IR/ROXICODONE Take 1-2 tablets (5-10 mg total) by mouth every 4 (four) hours as needed for moderate pain or severe pain.   oxyCODONE-acetaminophen 5-325 MG tablet Commonly known as: Percocet Take 1 tablet by mouth every 6 (six) hours as needed.   rivaroxaban 10 MG Tabs tablet Commonly known as: XARELTO Take 1 tablet (10 mg total) by mouth daily with breakfast. Take Xarelto for two and a half more weeks following discharge from the hospital, then discontinue Xarelto. Once the patient has completed the Xarelto, they may resume the 81 mg Aspirin.   rosuvastatin 5 MG tablet Commonly known as: CRESTOR Take 5 mg by mouth daily.   tamsulosin 0.4 MG Caps capsule Commonly known as: FLOMAX Take 1 capsule (0.4 mg total) by mouth daily after supper.   triamcinolone cream 0.1 % Commonly known as: KENALOG Apply 1 application  topically 2 (two) times daily.   valACYclovir 1000 MG tablet Commonly known as: VALTREX Take 1 tablet (1,000 mg total) by mouth 3 (three) times daily.        Allergies:  Allergies  Allergen Reactions   Dilaudid [Hydromorphone Hcl] Anaphylaxis   Hydromorphone Other (See Comments)    Other reaction(s): Irregular heart rate    No family history on file.  Social History:  reports that he has quit smoking. His smoking use included cigarettes. He has never used smokeless tobacco. He reports that he does not drink alcohol and does not use drugs.  ROS: A complete review of systems was performed.  All systems are negative except for pertinent findings as noted.  Physical Exam:  Vital signs in last 24 hours: There were no vitals taken for this visit. Constitutional:  Alert and oriented, No acute distress Cardiovascular: Regular rate  Respiratory: Normal respiratory effort Neurologic: Grossly intact, no focal deficits Psychiatric: Normal mood and affect  I have reviewed prior pt notes  I have reviewed urinalysis results--clear  IPSS sheet reviewed-15/2  I  have independently reviewed prior imaging--CT scan, MRI scan results.    Hospital records reviewed      Impression/Assessment:  Recent right ureteral stone/UTI, treated, last checked stone free  Grade group 2 prostate cancer, low-volume, needs continued follow-up    Plan:  1.  We will work on getting his prostate MRI set up.  If region of interest is noted, we will set up for fusion biopsy in [place].  If no regions of interest, repeat biopsy here in [place]  2.  We will schedule KUB at the time of his MRI

## 2023-05-17 ENCOUNTER — Ambulatory Visit: Payer: Medicare Other | Admitting: Urology

## 2023-05-17 ENCOUNTER — Encounter: Payer: Self-pay | Admitting: Urology

## 2023-05-17 ENCOUNTER — Telehealth: Payer: Self-pay | Admitting: Urology

## 2023-05-17 ENCOUNTER — Telehealth: Payer: Self-pay

## 2023-05-17 VITALS — BP 177/80

## 2023-05-17 DIAGNOSIS — Z87442 Personal history of urinary calculi: Secondary | ICD-10-CM

## 2023-05-17 DIAGNOSIS — C61 Malignant neoplasm of prostate: Secondary | ICD-10-CM

## 2023-05-17 DIAGNOSIS — N200 Calculus of kidney: Secondary | ICD-10-CM

## 2023-05-17 LAB — MICROSCOPIC EXAMINATION: Bacteria, UA: NONE SEEN

## 2023-05-17 LAB — URINALYSIS, ROUTINE W REFLEX MICROSCOPIC
Bilirubin, UA: NEGATIVE
Glucose, UA: NEGATIVE
Ketones, UA: NEGATIVE
Nitrite, UA: NEGATIVE
Protein,UA: NEGATIVE
RBC, UA: NEGATIVE
Specific Gravity, UA: 1.025 (ref 1.005–1.030)
Urobilinogen, Ur: 0.2 mg/dL (ref 0.2–1.0)
pH, UA: 6 (ref 5.0–7.5)

## 2023-05-17 NOTE — Telephone Encounter (Addendum)
 Called Pt to verify todays appt and make sure they are coming Pt wife states they are coming Pt wife was also asked if they received phone calls regarding husbands MRI wife states they never received phone call Per MD he will see Pt today regardless of no imaging

## 2023-05-17 NOTE — Telephone Encounter (Signed)
 Patient is claustrophobic will you prescribe him something for MRI ?

## 2023-05-17 NOTE — Telephone Encounter (Signed)
 FYI and advise

## 2023-05-24 ENCOUNTER — Other Ambulatory Visit: Payer: Self-pay | Admitting: Urology

## 2023-05-24 DIAGNOSIS — C61 Malignant neoplasm of prostate: Secondary | ICD-10-CM

## 2023-05-24 MED ORDER — DIAZEPAM 10 MG PO TABS
ORAL_TABLET | ORAL | 0 refills | Status: DC
Start: 2023-05-24 — End: 2023-10-11

## 2023-05-25 ENCOUNTER — Ambulatory Visit (HOSPITAL_COMMUNITY)
Admission: RE | Admit: 2023-05-25 | Discharge: 2023-05-25 | Disposition: A | Discharge status: Home / Self Care | Payer: Medicare Other | Source: Ambulatory Visit | Attending: Urology | Admitting: Urology

## 2023-05-25 ENCOUNTER — Ambulatory Visit (HOSPITAL_COMMUNITY)
Admission: RE | Admit: 2023-05-25 | Discharge: 2023-05-25 | Disposition: A | Discharge status: Home / Self Care | Source: Ambulatory Visit | Attending: Urology | Admitting: Urology

## 2023-05-25 DIAGNOSIS — C61 Malignant neoplasm of prostate: Secondary | ICD-10-CM | POA: Insufficient documentation

## 2023-05-25 DIAGNOSIS — N200 Calculus of kidney: Secondary | ICD-10-CM | POA: Insufficient documentation

## 2023-05-25 LAB — POCT I-STAT CREATININE: Creatinine, Ser: 1.6 mg/dL — ABNORMAL HIGH (ref 0.61–1.24)

## 2023-05-25 MED ORDER — GADOBUTROL 1 MMOL/ML IV SOLN
10.0000 mL | Freq: Once | INTRAVENOUS | Status: AC | PRN
  Administered 2023-05-25: 10 mL via INTRAVENOUS

## 2023-05-30 DIAGNOSIS — E1140 Type 2 diabetes mellitus with diabetic neuropathy, unspecified: Secondary | ICD-10-CM | POA: Diagnosis not present

## 2023-05-30 DIAGNOSIS — M722 Plantar fascial fibromatosis: Secondary | ICD-10-CM | POA: Diagnosis not present

## 2023-05-30 DIAGNOSIS — M7731 Calcaneal spur, right foot: Secondary | ICD-10-CM | POA: Diagnosis not present

## 2023-05-30 DIAGNOSIS — M7732 Calcaneal spur, left foot: Secondary | ICD-10-CM | POA: Diagnosis not present

## 2023-05-30 DIAGNOSIS — M79671 Pain in right foot: Secondary | ICD-10-CM | POA: Diagnosis not present

## 2023-05-30 DIAGNOSIS — M79672 Pain in left foot: Secondary | ICD-10-CM | POA: Diagnosis not present

## 2023-06-14 ENCOUNTER — Telehealth: Payer: Self-pay

## 2023-06-14 NOTE — Telephone Encounter (Signed)
 Patient was made aware and voiced understanding.

## 2023-06-14 NOTE — Telephone Encounter (Signed)
 Patient's wife states patient  was unable and will return call to office.

## 2023-06-14 NOTE — Telephone Encounter (Signed)
-----   Message from Bertram Millard Dahlstedt sent at 06/13/2023 12:24 PM EDT ----- These call patient-stones in the kidneys are stable, same size.  It does not look like he is passing anything. ----- Message ----- From: Interface, Rad Results In Sent: 06/12/2023   2:05 AM EDT To: Marcine Matar, MD

## 2023-06-21 DIAGNOSIS — M79672 Pain in left foot: Secondary | ICD-10-CM | POA: Diagnosis not present

## 2023-06-21 DIAGNOSIS — M722 Plantar fascial fibromatosis: Secondary | ICD-10-CM | POA: Diagnosis not present

## 2023-06-21 DIAGNOSIS — M7732 Calcaneal spur, left foot: Secondary | ICD-10-CM | POA: Diagnosis not present

## 2023-06-21 DIAGNOSIS — E1140 Type 2 diabetes mellitus with diabetic neuropathy, unspecified: Secondary | ICD-10-CM | POA: Diagnosis not present

## 2023-06-21 DIAGNOSIS — M7731 Calcaneal spur, right foot: Secondary | ICD-10-CM | POA: Diagnosis not present

## 2023-06-21 DIAGNOSIS — M79671 Pain in right foot: Secondary | ICD-10-CM | POA: Diagnosis not present

## 2023-06-24 ENCOUNTER — Other Ambulatory Visit: Payer: Self-pay | Admitting: Urology

## 2023-06-24 DIAGNOSIS — C61 Malignant neoplasm of prostate: Secondary | ICD-10-CM

## 2023-06-24 NOTE — Telephone Encounter (Signed)
 Patient made aware and given phone number to [name] to set up fusion biopsy. Patient voiced understanding. Please call pt--still has an abnormal area in prostate that is suspicious for possible cancer. I would recommend that we send him to [facility]  for fusion biopsy again--I put order in.   Let him know that he also has 1 stone remaining in each kidney

## 2023-06-30 DIAGNOSIS — C44311 Basal cell carcinoma of skin of nose: Secondary | ICD-10-CM | POA: Diagnosis not present

## 2023-07-02 IMAGING — CT CT CERVICAL SPINE W/O CM
3 series · 12 of 33 positions shown, 14 images · non-contrast
Comparison: 08/29/2006.

CLINICAL DATA: Headache. Pt unable to turn his head. Pt has his
head tilted to the right and stated he is unable to turn it due to
pain. Pain started yesterday while pt was at home and at first it
was only sore and as the night progressed became worse and unable to
move.



[Series 4: c spine soft · axial · 0.34mm/px · z∈[-108,+26]mm · 4 of 99 slices shown, 5 images]
[im 16/99  soft-tissue]
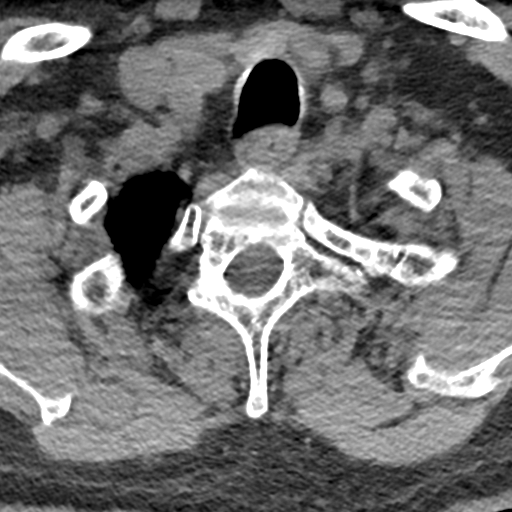
[im 16/99  bone]
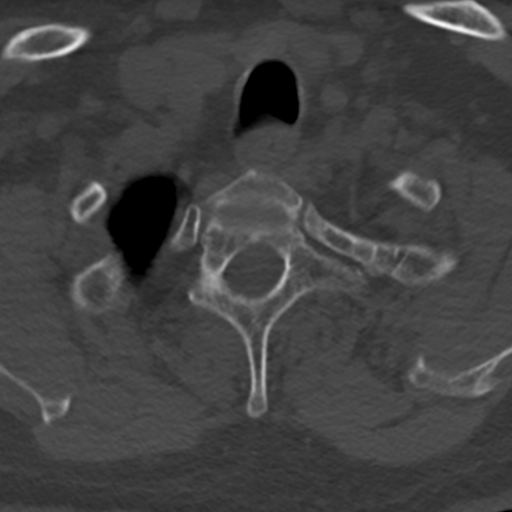
[im 38/99  bone]
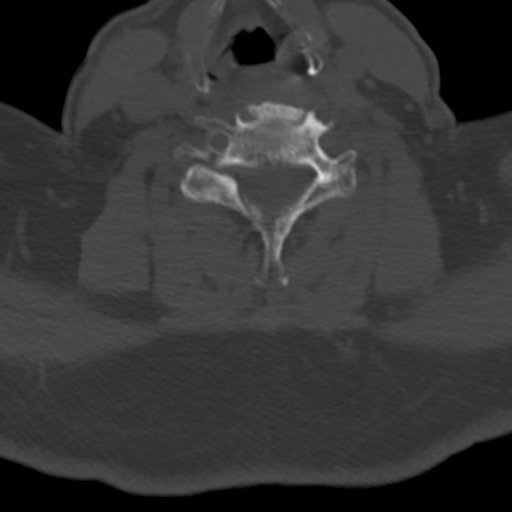
[im 61/99  bone]
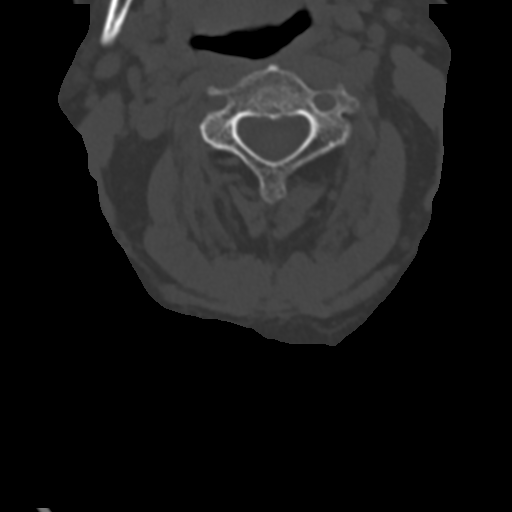
[im 83/99  bone]
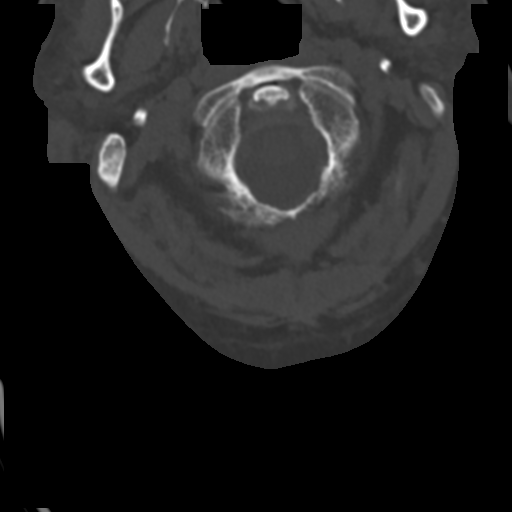

[Series 5: sagittal bone · sagittal · 0.28mm/px · 5 of 94 slices shown, 6 images]
[im 32/94  bone]
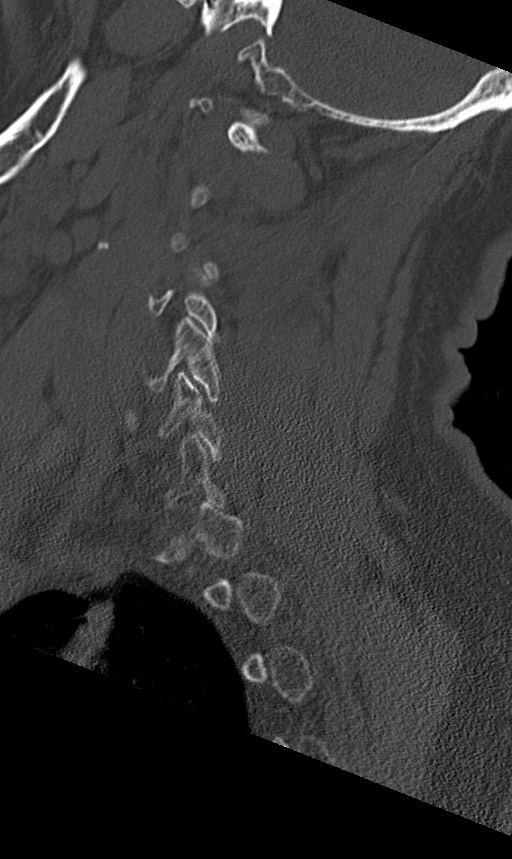
[im 39/94  bone]
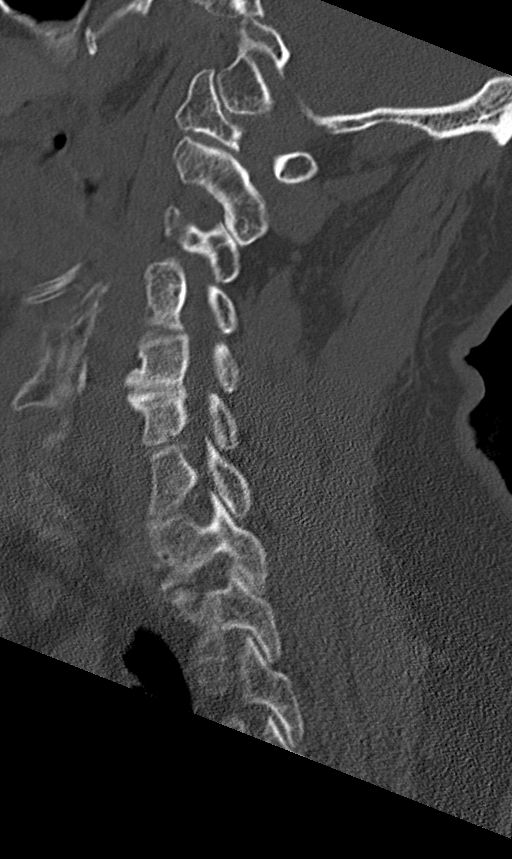
[im 47/94  soft-tissue]
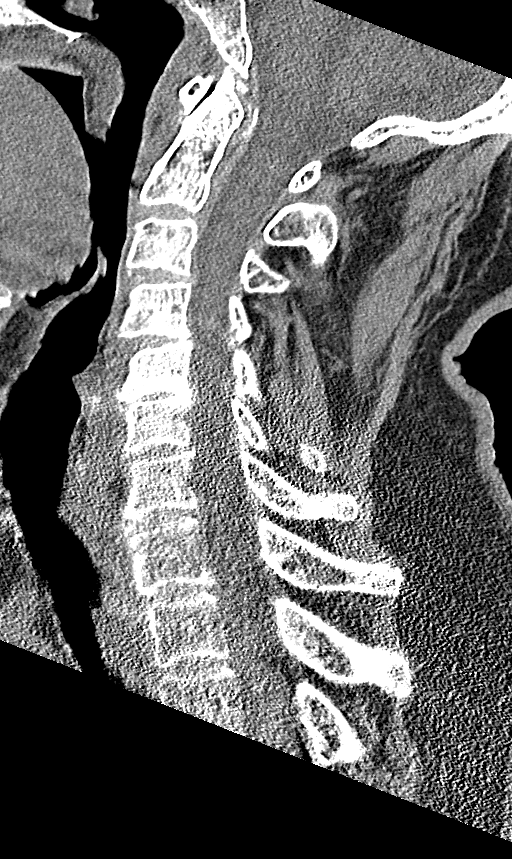
[im 47/94  bone]
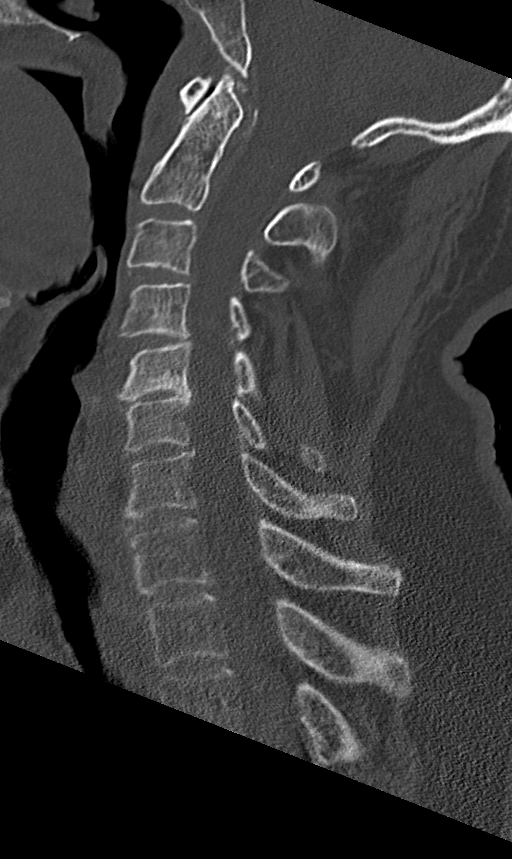
[im 55/94  bone]
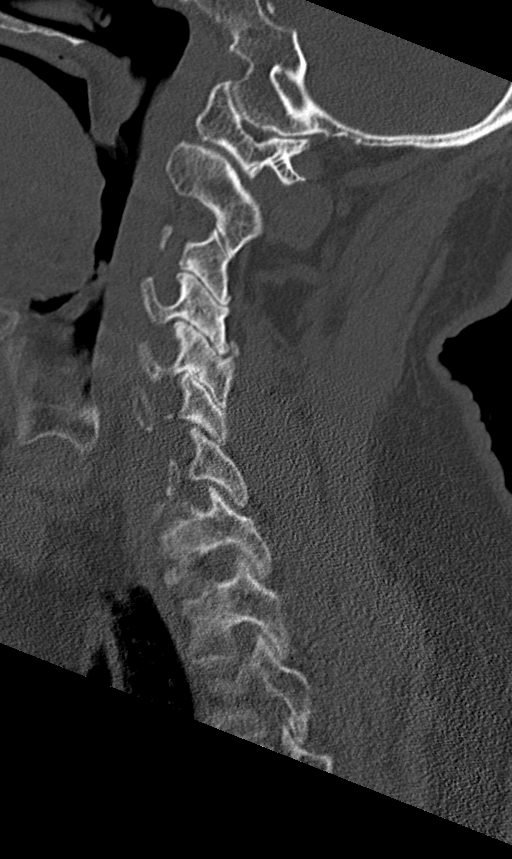
[im 63/94  bone]
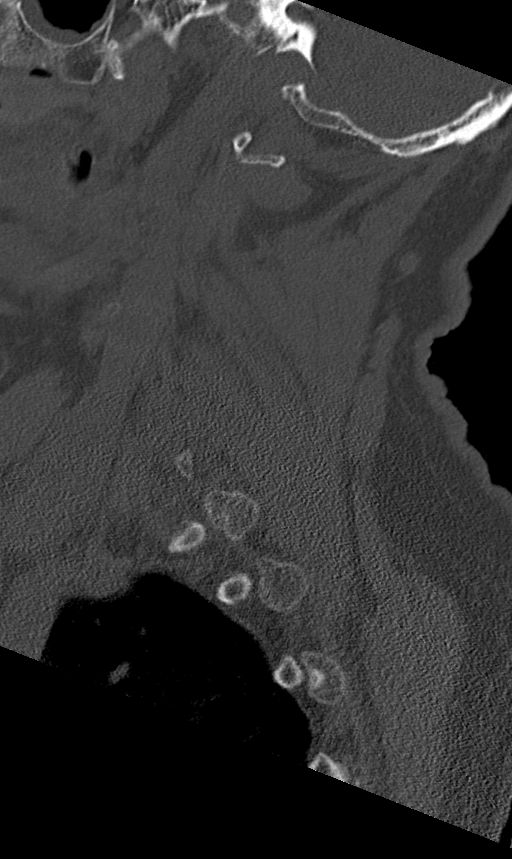

[Series 6: coronal bone · coronal · 0.33mm/px · 3 of 64 slices shown]
[im 13/64  bone]
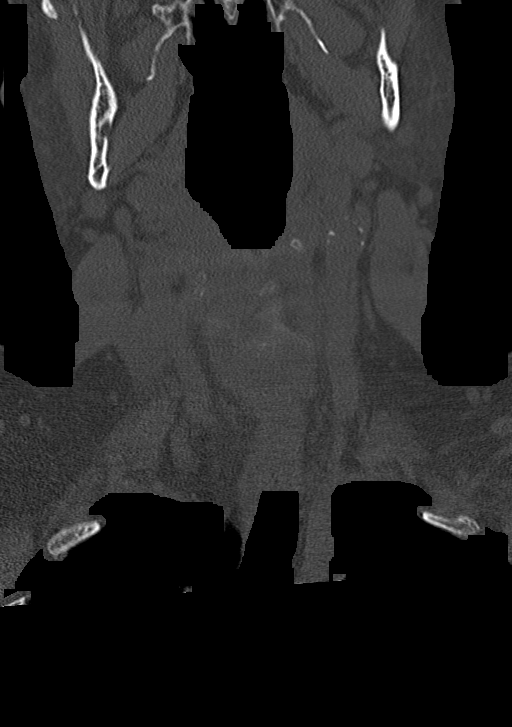
[im 26/64  bone]
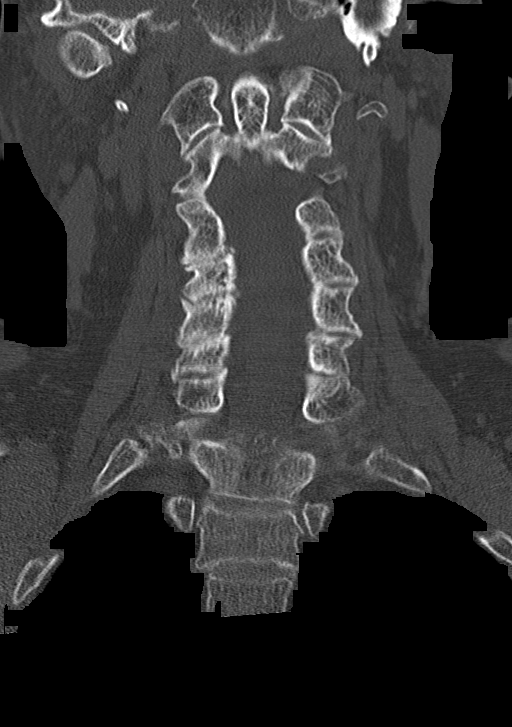
[im 38/64  bone]
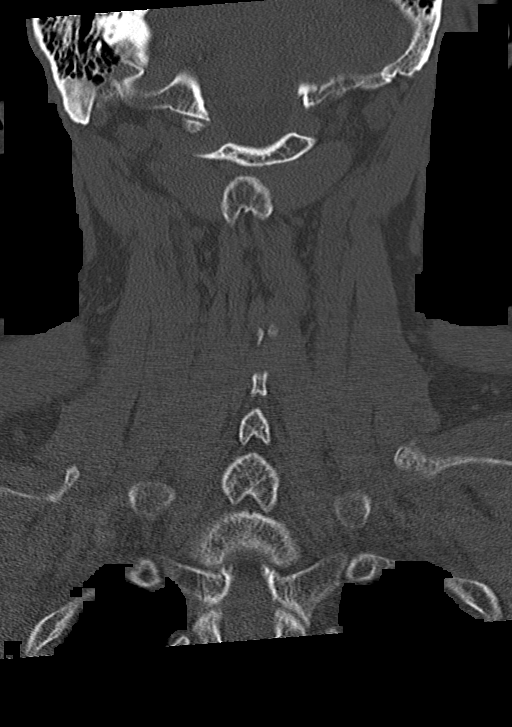

[12 of 33 positions shown; findings below may reference images not displayed]

FINDINGS: CT HEAD FINDINGS

Brain: No evidence of acute infarction, hemorrhage, hydrocephalus,
extra-axial collection or mass lesion/mass effect.

Vascular: No hyperdense vessel or unexpected calcification.

Skull: Normal. Negative for fracture or focal lesion.

Sinuses/Orbits: Globes and orbits are unremarkable. Mild right
maxillary sinus mucosal thickening.

Other: None.

CT CERVICAL SPINE FINDINGS

Alignment: Mild curvature, convex the left.  No spondylolisthesis.

Skull base and vertebrae: No acute fracture. No primary bone lesion
or focal pathologic process.

Soft tissues and spinal canal: No prevertebral fluid or swelling. No
visible canal hematoma.

Disc levels: Moderate loss of disc height at C5-C6. Mild disc
bulging and endplate spurring. No significant stenosis. No evidence
of a disc herniation. Facet degenerative changes, most evident on
the right at C3-C4 C4-C5.

Upper chest: Negative.

Other: None.
IMPRESSION: HEAD CT

1. No acute intracranial abnormalities.

CERVICAL CT

1. No fracture or acute finding.

## 2023-07-02 IMAGING — CT CT HEAD W/O CM
4 series · 15 of 47 positions shown, 17 images · non-contrast
Comparison: 08/29/2006.

CLINICAL DATA: Headache. Pt unable to turn his head. Pt has his
head tilted to the right and stated he is unable to turn it due to
pain. Pain started yesterday while pt was at home and at first it
was only sore and as the night progressed became worse and unable to
move.



[Series 2: head w o · axial · 0.46mm/px · z∈[+58,+178]mm · 7 of 34 slices shown, 9 images]
[im 5/34  brain]
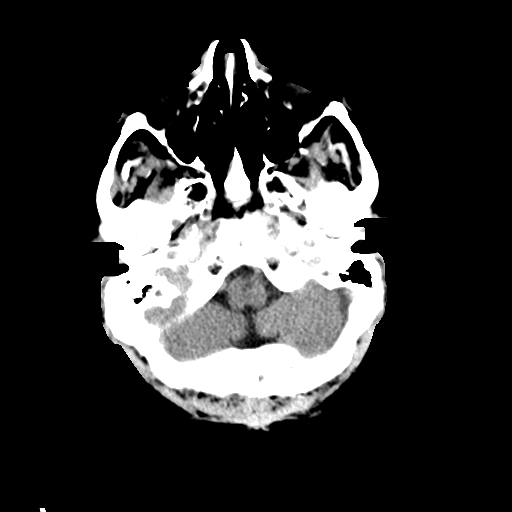
[im 5/34  bone]
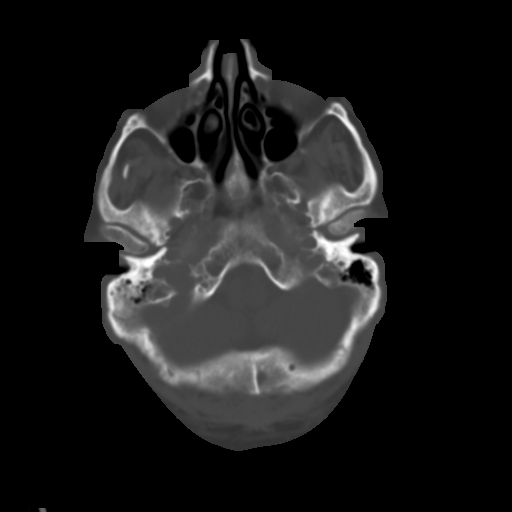
[im 9/34  brain]
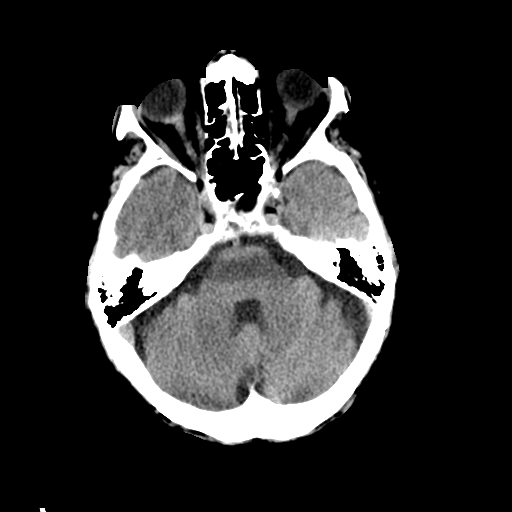
[im 13/34  brain]
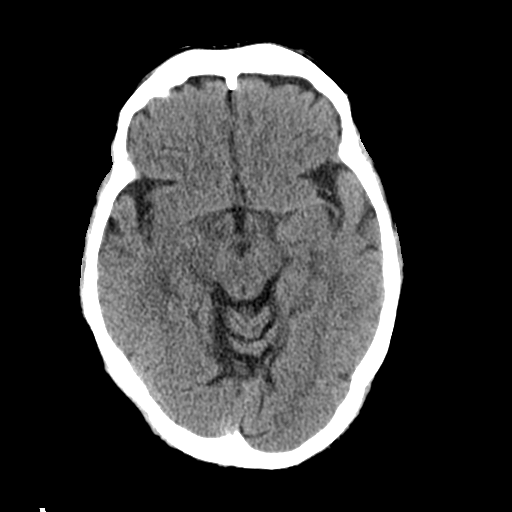
[im 17/34  brain]
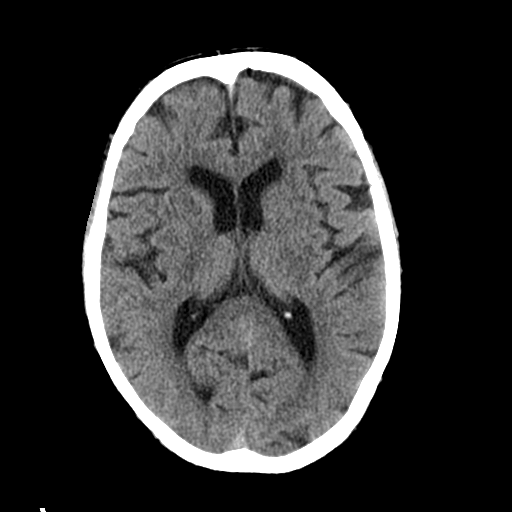
[im 21/34  brain]
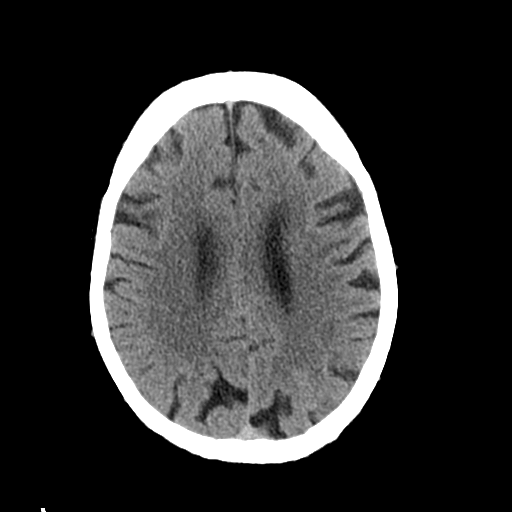
[im 21/34  bone]
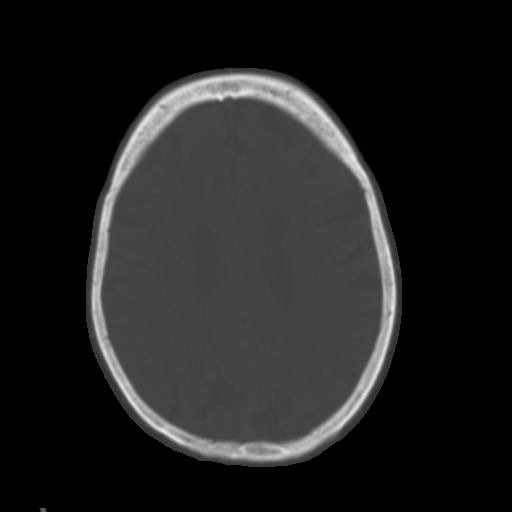
[im 25/34  brain]
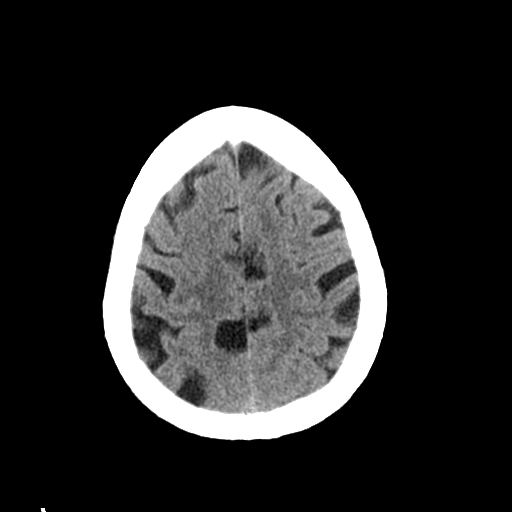
[im 29/34  brain]
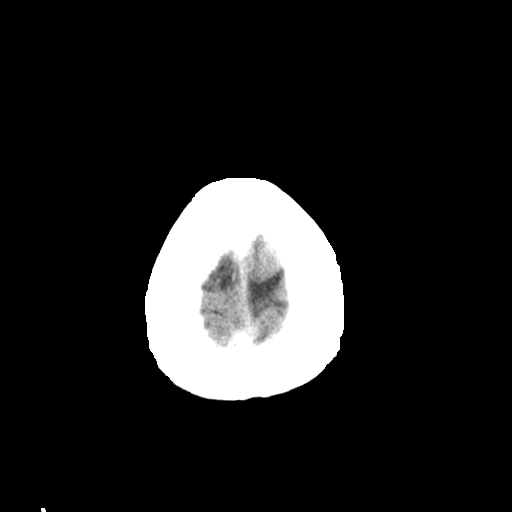

[Series 3: head bone · axial · 0.46mm/px · z∈[+54,+70]mm · 2 of 84 slices shown]
[im 9/84  bone]
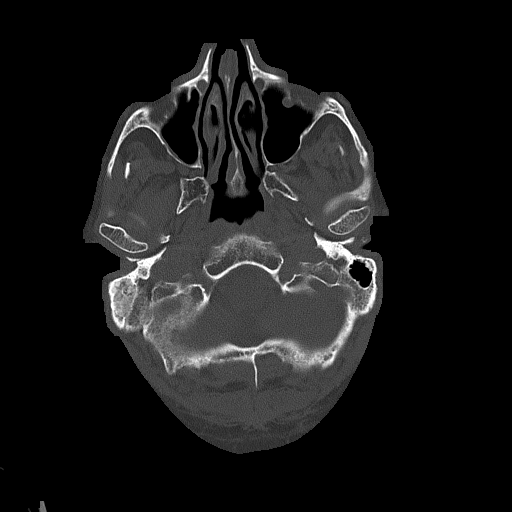
[im 17/84  bone]
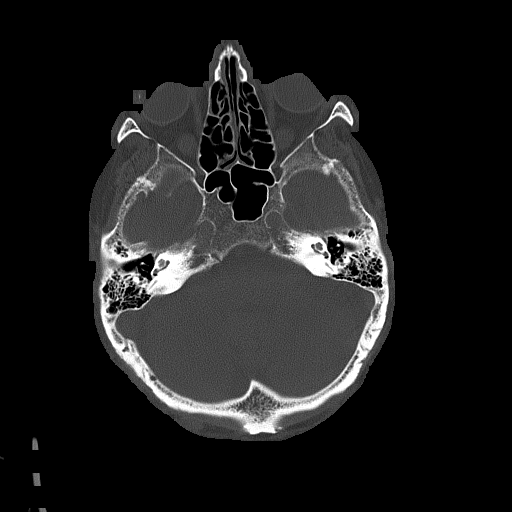

[Series 4: coronal soft · coronal · 0.33mm/px · 3 of 74 slices shown]
[im 25/74  brain]
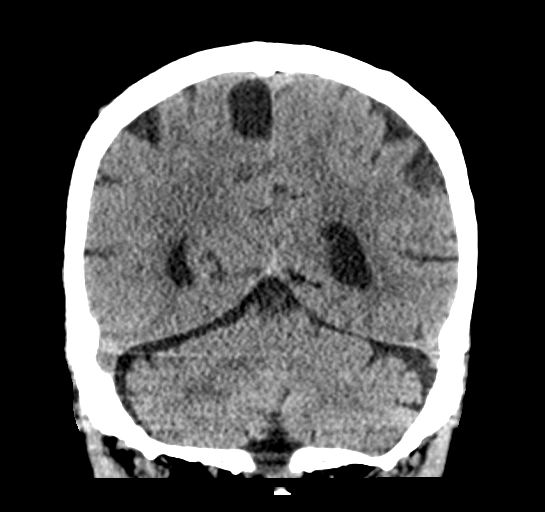
[im 33/74  brain]
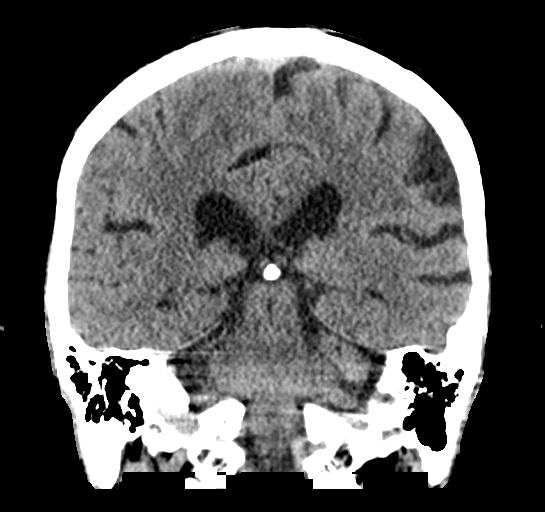
[im 41/74  brain]
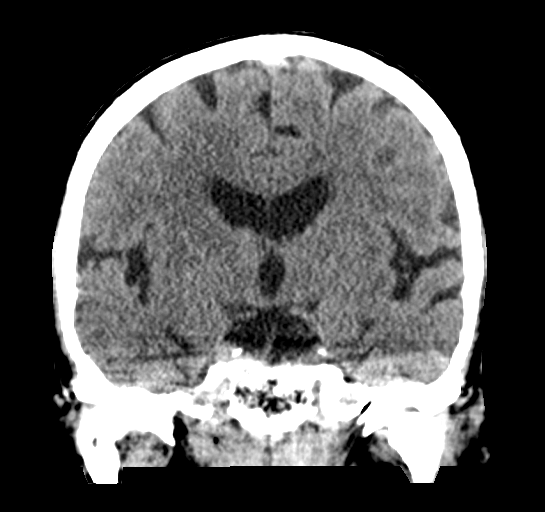

[Series 5: sagittal soft · sagittal · 0.33mm/px · 3 of 61 slices shown]
[im 21/61  brain]
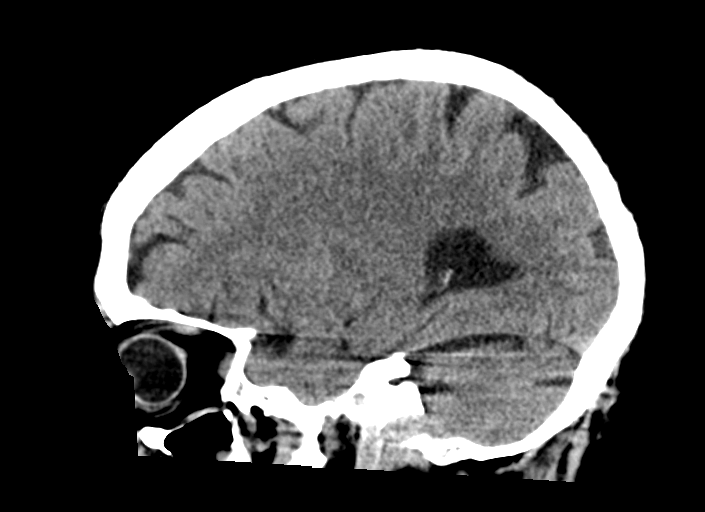
[im 31/61  brain]
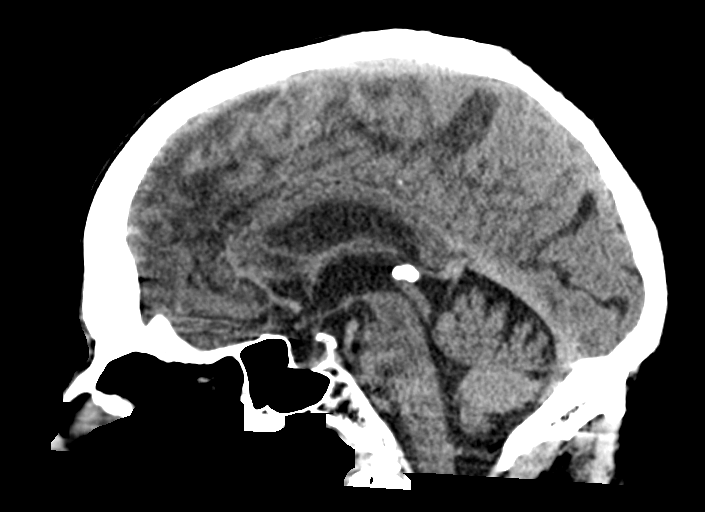
[im 41/61  brain]
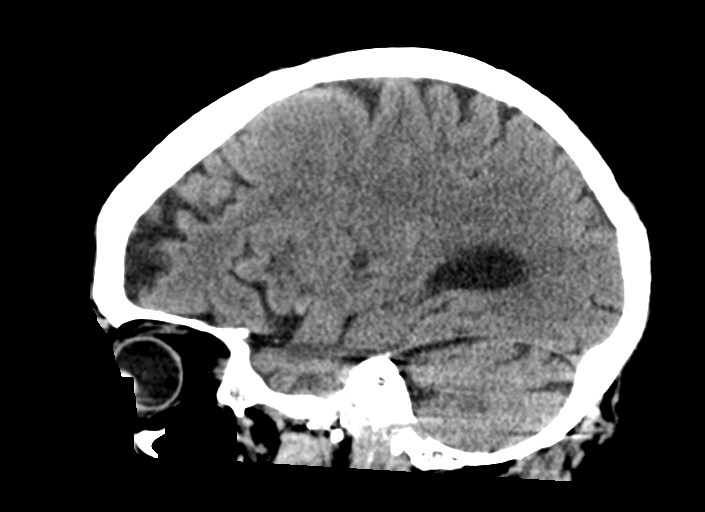

[15 of 47 positions shown; findings below may reference images not displayed]

FINDINGS: CT HEAD FINDINGS

Brain: No evidence of acute infarction, hemorrhage, hydrocephalus,
extra-axial collection or mass lesion/mass effect.

Vascular: No hyperdense vessel or unexpected calcification.

Skull: Normal. Negative for fracture or focal lesion.

Sinuses/Orbits: Globes and orbits are unremarkable. Mild right
maxillary sinus mucosal thickening.

Other: None.

CT CERVICAL SPINE FINDINGS

Alignment: Mild curvature, convex the left.  No spondylolisthesis.

Skull base and vertebrae: No acute fracture. No primary bone lesion
or focal pathologic process.

Soft tissues and spinal canal: No prevertebral fluid or swelling. No
visible canal hematoma.

Disc levels: Moderate loss of disc height at C5-C6. Mild disc
bulging and endplate spurring. No significant stenosis. No evidence
of a disc herniation. Facet degenerative changes, most evident on
the right at C3-C4 C4-C5.

Upper chest: Negative.

Other: None.
IMPRESSION: HEAD CT

1. No acute intracranial abnormalities.

CERVICAL CT

1. No fracture or acute finding.

## 2023-07-18 DIAGNOSIS — M79671 Pain in right foot: Secondary | ICD-10-CM | POA: Diagnosis not present

## 2023-07-18 DIAGNOSIS — M7731 Calcaneal spur, right foot: Secondary | ICD-10-CM | POA: Diagnosis not present

## 2023-07-18 DIAGNOSIS — M722 Plantar fascial fibromatosis: Secondary | ICD-10-CM | POA: Diagnosis not present

## 2023-07-18 DIAGNOSIS — M7732 Calcaneal spur, left foot: Secondary | ICD-10-CM | POA: Diagnosis not present

## 2023-07-18 DIAGNOSIS — M79672 Pain in left foot: Secondary | ICD-10-CM | POA: Diagnosis not present

## 2023-07-18 DIAGNOSIS — E1140 Type 2 diabetes mellitus with diabetic neuropathy, unspecified: Secondary | ICD-10-CM | POA: Diagnosis not present

## 2023-07-29 ENCOUNTER — Telehealth: Payer: Self-pay

## 2023-07-29 NOTE — Telephone Encounter (Signed)
 Called PCP to gets surgical clearance for [name] Per Sallyanne Creamer a verbal from MD [facility] given pt can hold Asprin starting Monday [name] was contacted and told verbal was given Tammy would like a written clearance as well but will notify pt of clearance

## 2023-08-08 DIAGNOSIS — M722 Plantar fascial fibromatosis: Secondary | ICD-10-CM | POA: Diagnosis not present

## 2023-08-08 DIAGNOSIS — E1140 Type 2 diabetes mellitus with diabetic neuropathy, unspecified: Secondary | ICD-10-CM | POA: Diagnosis not present

## 2023-08-08 DIAGNOSIS — M7731 Calcaneal spur, right foot: Secondary | ICD-10-CM | POA: Diagnosis not present

## 2023-08-08 DIAGNOSIS — M79671 Pain in right foot: Secondary | ICD-10-CM | POA: Diagnosis not present

## 2023-08-08 DIAGNOSIS — M7732 Calcaneal spur, left foot: Secondary | ICD-10-CM | POA: Diagnosis not present

## 2023-08-08 DIAGNOSIS — M79672 Pain in left foot: Secondary | ICD-10-CM | POA: Diagnosis not present

## 2023-08-17 ENCOUNTER — Telehealth: Payer: Self-pay

## 2023-08-17 NOTE — Telephone Encounter (Signed)
 Tried returning call to patient. Someone pick up and hung up phone.

## 2023-08-18 ENCOUNTER — Telehealth: Payer: Self-pay

## 2023-08-18 NOTE — Telephone Encounter (Signed)
 Pt wife called to receive bx results pt was advised MD is not in office until Tuesday however a message will be sent to notify the MD

## 2023-08-23 ENCOUNTER — Telehealth: Payer: Self-pay

## 2023-08-23 NOTE — Telephone Encounter (Signed)
 Pt wife called and left vm stating they still did not receive bx results

## 2023-08-30 ENCOUNTER — Ambulatory Visit: Admitting: Urology

## 2023-08-30 VITALS — BP 165/70 | HR 70

## 2023-08-30 DIAGNOSIS — Z87442 Personal history of urinary calculi: Secondary | ICD-10-CM

## 2023-08-30 DIAGNOSIS — Z87440 Personal history of urinary (tract) infections: Secondary | ICD-10-CM

## 2023-08-30 DIAGNOSIS — C61 Malignant neoplasm of prostate: Secondary | ICD-10-CM | POA: Diagnosis not present

## 2023-08-30 NOTE — Progress Notes (Signed)
 History of Present Illness:  : 10.14.2016: TRUS/Bx.  PSA 5.9, prostate volume 41 mL, PSA density 0.14.  4/12 cores positive, all in the right apical and mid prostate, all with GG 1 pathology. Strong family history of prostate cancer.  Active surveillance chosen.   9.25.2018: Prostate MRI-2 cm PI-RADS 5 lesion in left transition zone. 11.16.2018: Fusion biopsy.  Volume 42 mL.  4 cores were taken from the region of interest.  3 of these revealed GG 1 pattern, 1 was benign.  2/12 systematic cores-from the left mid medial and left apical medial also revealed GG 1 pattern.   7.7.2020: PSA 6.1.  He was to have been scheduled for repeat prostate MRI with possible fusion biopsy in 6 months.  Neither of these was performed.   7.5.2022: He apparently had a PSA drawn within the past month with Dr. Darien Eden.  He states that it was 7.0.  He has had no bony pain.  He has no significant lower urinary tract symptoms that are overall stable.  He has had recent intermittent gross painless hematuria.  He really does not have any significant history of cigarette smoking.   8.16.2022:  He did have a CT scan on 7/18 revealing:  IMPRESSION: 1. Numerous bilateral nonobstructive renal calculi. 2. Urinary bladder wall thickening may partially be due to nondistention although a component of cystitis cannot be excluded. 3. Bilateral benign-appearing renal cysts. Some of the hypodense right renal lesions of the left kidney are technically too small to characterize. 4. Potential morphologic indicators of early cirrhosis in the liver. 5. Multilevel lumbar impingement. 6. 60 cubic mm left lower lobe subpleural nodule on image 6 series 15. No follow-up needed if patient is low-risk. Non-contrast chest CT can be considered in 12 months if patient is high-risk.  8. 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  Cystoscopy revealed bilobar hyperplasia with obstruction.  Normal  bladder.  5.22.2023: PSA 7.0  4.9.2024: PSA 6.1.  Prostate MRI ordered.  10.9.2024:   Underwent urgent stent placement for a right ureteral stone with obstruction and sepsis   11.5.2024: ESL of ureteral stone. Stent subsequently removed 11.19.2024  3.5.2025: Repeat MRI prostate FINDINGS: Prostate:   Encapsulated nodularity in the transition zone compatible with benign prostatic hypertrophy.   Region of interest # 1: Ill-defined PI-RADS category 5 lesion of the left anterior transition zone extending from the base through the apex, with involvement of the posterior transition zone in the mid gland and apex; involvement of the anterior peripheral zone in the mid gland and apex; and involvement of the left posterolateral peripheral zone in the apex. This lesion has ill-defined low T2 signal (image 12 series 6) with low ADC map activity and restricted diffusion (image 11 of series 8 and 9) as well as early enhancement (image 140 of series 13). This lesion measures 5.76 cc (2.3 by 1.7 by 2.6 cm), representing enlargement compared to previous. No clearly delineated extracapsular spread or seminal vesicle invasion. Volume: 3D volumetric assessment: Prostate volume 35.7 cc (5.2 by 3.4 by 3.9 cm). Transcapsular spread: Absent Seminal vesicle involvement: Absent Neurovascular bundle involvement: Absent Pelvic adenopathy: Absent Bone metastasis: Absent Other findings: No other significant findings.   IMPRESSION: 1. Ill-defined PI-RADS category 5 lesion of the left prostate gland, increased in size compared to the 2018 exam. Targeting data sent to [facility]. 2. No clearly delineated extracapsular spread or seminal vesicle invasion. 3. Mild prostatomegaly and benign prostatic hypertrophy.  5.16.2025: Fusion biopsy  Volume 50  mL 3 cores were taken from the region of interest, 12 systematic cores taken. All 3 cores from region of interest revealed GS 3+4 pattern and 40, 40, and 50% of  cores respectively. 4/12 systematic cores (left base medial, left mid medial, left mid lateral, left apex lateral) revealed GS 3+4 pattern and 50, 5, 60 and 30% of cores respectively  Past Medical History:  Diagnosis Date   Arthritis    Bradycardia    asymptomatic    History of kidney stones    Hypertension    Prostate cancer [facility])     Past Surgical History:  Procedure Laterality Date   COLONOSCOPY     CYSTOSCOPY WITH STENT PLACEMENT Right 12/28/2022   Procedure: CYSTOSCOPY WITH STENT PLACEMENT;  Surgeon: Samson Croak, MD;  Location: [facility] OR;  Service: Urology;  Laterality: Right;   EXTRACORPOREAL SHOCK WAVE LITHOTRIPSY Right 01/25/2023   Procedure: EXTRACORPOREAL SHOCK WAVE LITHOTRIPSY (ESWL);  Surgeon: Trent Frizzle, MD;  Location: [facility] ORS;  Service: Urology;  Laterality: Right;   FLEXIBLE SIGMOIDOSCOPY N/A 04/02/2015   Procedure: FLEXIBLE SIGMOIDOSCOPY;  Surgeon: Ruby Corporal, MD;  Location: [facility] ENDO SUITE;  Service: Endoscopy;  Laterality: N/A;  1:45 - moved to 1/11 @ 8:30 - Ann to notify   KIDNEY STONE SURGERY     TOTAL KNEE ARTHROPLASTY Left 05/02/2017   Procedure: LEFT TOTAL KNEE ARTHROPLASTY;  Surgeon: Liliane Rei, MD;  Location: [facility] ORS;  Service: Orthopedics;  Laterality: Left;    Home Medications:  Allergies as of 08/30/2023       Reactions   Dilaudid [hydromorphone Hcl] Anaphylaxis   Hydromorphone Other (See Comments)   Other reaction(s): Irregular heart rate        Medication List        Accurate as of August 30, 2023  3:43 PM. If you have any questions, ask your nurse or doctor.          albuterol  108 (90 Base) MCG/ACT inhaler Commonly known as: VENTOLIN  HFA Inhale 2 puffs into the lungs every 6 (six) hours as needed for wheezing or shortness of breath.   allopurinol 100 MG tablet Commonly known as: ZYLOPRIM Take 100 mg by mouth daily.   amLODipine  5 MG tablet Commonly known as: NORVASC  Take 1 tablet by mouth daily.   aspirin  81 MG  chewable tablet Chew by mouth daily.   cetirizine 10 MG tablet Commonly known as: ZYRTEC Take 10 mg by mouth at bedtime.   clobetasol cream 0.05 % Commonly known as: TEMOVATE Apply 1 application  topically 2 (two) times daily.   clotrimazole -betamethasone  cream Commonly known as: LOTRISONE  Apply 1 Application topically 2 (two) times daily.   cyclobenzaprine  10 MG tablet Commonly known as: FLEXERIL  Take 1 tablet (10 mg total) by mouth 3 (three) times daily as needed for muscle spasms.   diazepam  10 MG tablet Commonly known as: VALIUM  Take 1 tablet 2 hours before procedure   gabapentin  400 MG capsule Commonly known as: NEURONTIN  Take 400 mg by mouth 2 (two) times daily.   glipiZIDE 2.5 MG 24 hr tablet Commonly known as: GLUCOTROL XL Take 2.5 mg by mouth daily.   GLUCOSAMINE CHOND TRIPLE/VIT D PO Take 2 tablets by mouth daily.   indomethacin 25 MG capsule Commonly known as: INDOCIN Take 25 mg by mouth 3 (three) times daily.   loperamide  2 MG capsule Commonly known as: IMODIUM  Take 1 capsule (2 mg total) by mouth 2 (two) times daily.   meclizine 25 MG tablet Commonly  known as: ANTIVERT Take 25 mg by mouth 3 (three) times daily as needed for dizziness. Take 1/2 to 1 tablet 3 times a day as needed for Dizziness   Melatonin 5 MG Caps Take by mouth.   methocarbamol  500 MG tablet Commonly known as: ROBAXIN  Take 1 tablet (500 mg total) by mouth every 6 (six) hours as needed for muscle spasms.   metoprolol succinate 25 MG 24 hr tablet Commonly known as: TOPROL-XL Take 25 mg by mouth 2 (two) times daily.   multivitamin tablet Take 1 tablet by mouth daily.   oxyCODONE  5 MG immediate release tablet Commonly known as: Oxy IR/ROXICODONE  Take 1-2 tablets (5-10 mg total) by mouth every 4 (four) hours as needed for moderate pain or severe pain.   oxyCODONE -acetaminophen  5-325 MG tablet Commonly known as: Percocet Take 1 tablet by mouth every 6 (six) hours as needed.    rivaroxaban  10 MG Tabs tablet Commonly known as: XARELTO  Take 1 tablet (10 mg total) by mouth daily with breakfast. Take Xarelto  for two and a half more weeks following discharge from the hospital, then discontinue Xarelto . Once the patient has completed the Xarelto , they may resume the 81 mg Aspirin .   rosuvastatin  5 MG tablet Commonly known as: CRESTOR  Take 5 mg by mouth daily.   tamsulosin  0.4 MG Caps capsule Commonly known as: FLOMAX  Take 1 capsule (0.4 mg total) by mouth daily after supper.   triamcinolone  cream 0.1 % Commonly known as: KENALOG  Apply 1 application topically 2 (two) times daily.   valACYclovir  1000 MG tablet Commonly known as: VALTREX  Take 1 tablet (1,000 mg total) by mouth 3 (three) times daily.        Allergies:  Allergies  Allergen Reactions   Dilaudid [Hydromorphone Hcl] Anaphylaxis   Hydromorphone Other (See Comments)    Other reaction(s): Irregular heart rate    No family history on file.  Social History:  reports that he has quit smoking. His smoking use included cigarettes. He has never used smokeless tobacco. He reports that he does not drink alcohol and does not use drugs.  ROS: A complete review of systems was performed.  All systems are negative except for pertinent findings as noted.  Physical Exam:  Vital signs in last 24 hours: BP (!) 165/70   Pulse 70  Constitutional:  Alert and oriented, No acute distress Cardiovascular: Regular rate  Respiratory: Normal respiratory effort Neurologic: Grossly intact, no focal deficits Psychiatric: Normal mood and affect  I have reviewed prior pt notes  I have reviewed urinalysis results--clear  IPSS sheet reviewed-15/2  I have independently reviewed prior imaging--CT scan, MRI scan results.    Hospital records reviewed      Impression/Assessment:  Recent right ureteral stone/UTI, treated, last checked stone free  Grade group 2 prostate cancer, now higher volume    Plan:   1.  Lengthy discussion held with the patient that overall, even though he has higher volume grade group 2 prostate cancer, there is no urgent need for treatment, but it looks like he may be headed that way  2.  I will send his most recent biopsy specimen for Prolaris.  We will call him with results and then go from there with follow-up.  If treatment recommended, strongly consider brachytherapy

## 2023-09-06 ENCOUNTER — Telehealth: Payer: Self-pay

## 2023-09-06 NOTE — Telephone Encounter (Signed)
 Representative called to ask if we could fax over pt pathology results for needed lab rep advised results was faxed on 06/17

## 2023-09-19 DIAGNOSIS — W57XXXA Bitten or stung by nonvenomous insect and other nonvenomous arthropods, initial encounter: Secondary | ICD-10-CM | POA: Diagnosis not present

## 2023-09-19 DIAGNOSIS — E1165 Type 2 diabetes mellitus with hyperglycemia: Secondary | ICD-10-CM | POA: Diagnosis not present

## 2023-09-19 DIAGNOSIS — M533 Sacrococcygeal disorders, not elsewhere classified: Secondary | ICD-10-CM | POA: Diagnosis not present

## 2023-09-19 DIAGNOSIS — Z299 Encounter for prophylactic measures, unspecified: Secondary | ICD-10-CM | POA: Diagnosis not present

## 2023-09-19 DIAGNOSIS — I10 Essential (primary) hypertension: Secondary | ICD-10-CM | POA: Diagnosis not present

## 2023-09-20 ENCOUNTER — Telehealth: Payer: Self-pay

## 2023-09-20 ENCOUNTER — Other Ambulatory Visit: Payer: Self-pay | Admitting: Urology

## 2023-09-20 DIAGNOSIS — C61 Malignant neoplasm of prostate: Secondary | ICD-10-CM

## 2023-09-20 NOTE — Telephone Encounter (Signed)
 Patient's wife is calling to get seed implant scheduled as soon as possible.  Please advise.  Call:  [phone]

## 2023-09-20 NOTE — Telephone Encounter (Signed)
 Please let me know if this needs to be scheduled or if you are waiting for pathology results.  If needing to be scheduled please add to surgery workque or fill out posting sheet.  Thank you.

## 2023-09-21 ENCOUNTER — Telehealth: Payer: Self-pay | Admitting: Radiation Oncology

## 2023-09-21 ENCOUNTER — Encounter: Payer: Self-pay | Admitting: Urology

## 2023-09-21 NOTE — Telephone Encounter (Signed)
 7/2 Received call from pt's wife, patient ready to be schedule.  Called patient back/Left voicemail for pt to call our office.

## 2023-09-21 NOTE — Telephone Encounter (Signed)
 I spoke with the patient and his wife and informed him of MD's response.  They were given Dr. Alline office number and advised to reach out to them for an update on appt.

## 2023-09-29 ENCOUNTER — Other Ambulatory Visit: Payer: Self-pay | Admitting: Urology

## 2023-09-29 DIAGNOSIS — N471 Phimosis: Secondary | ICD-10-CM

## 2023-10-04 NOTE — Progress Notes (Addendum)
 GU Location of Tumor / Histology: Prostate CA  If Prostate Cancer, Gleason Score is (3 + 4) and PSA is (6.6 on 05/11/2023)  PSA  6.1 on 06/29/2022  Marcus Simpson presented as referral from Dr. Garnette Shack [facility]) elevated PSA.  Biopsies      05/25/2023 Dr. Garnette Shack MR Prostate with/without Contrast CLINICAL DATA:  Low risk prostate cancer under active surveillance.  IMPRESSION: 1. Ill-defined PI-RADS category 5 lesion of the left prostate gland, increased in size compared to the 2018 exam. Targeting data sent to [facility]. 2. No clearly delineated extracapsular spread or seminal vesicle invasion. 3. Mild prostatomegaly and benign prostatic hypertrophy.     Past/Anticipated interventions by urology, if any: NA  Past/Anticipated interventions by medical oncology, if any: NA  Weight changes, if any: No  IPSS:   14 SHIM:  10  Bowel/Bladder complaints, if any:  Constipation is taking Senakot and Metamucil with little effect per patient and wife.  Nausea/Vomiting, if any: No  Pain issues, if any:  0/10  SAFETY ISSUES: Prior radiation?  No Pacemaker/ICD? No Possible current pregnancy? Male Is the patient on methotrexate? No  Current Complaints / other details:  Passed kidney stone this am black in color size of tip of a match.  Spent 30 minutes with patient.

## 2023-10-07 ENCOUNTER — Encounter: Payer: Self-pay | Admitting: Urology

## 2023-10-07 NOTE — Progress Notes (Signed)
 Radiation Oncology         [phone] ________________________________  Initial Outpatient Consultation  Name: Marcus Simpson MRN: [id number]  Date: 10/11/2023  DOB: Jul 01, 1945  RR:[id number], Leta NOVAK, MD  [name], MD   REFERRING PHYSICIAN: [name], MD  DIAGNOSIS: 78 y.o. gentleman with Stage T1c adenocarcinoma of the prostate with Gleason score of 3+4, and PSA of 6.6.    ICD-10-CM   1. Malignant neoplasm of prostate (HCC)  C61       HISTORY OF PRESENT ILLNESS: Marcus Simpson is a 78 y.o. male with a diagnosis of prostate cancer. He was initially diagnosed with Gleason 3+3 prostate cancer on biopsy in October 2016 with a PSA of 5.9.  He elected to proceed with active surveillance and has continued in routine follow-up under the care of direction of Dr. Matilda.  Surveillance prostate MRI and biopsy in 2018 showed stable Gleason 3+3 disease.  His PSA remained stable at 6.1 in July 2020 and repeat prostate MRI and biopsy were recommended at that time but not completed.  His PSA was further elevated at 7.0 on routine labs with his PCP in June 2022.  This was further evaluated with CT A/P in August 2022 which showed numerous bilateral nonobstructing renal calculi but no concerning findings or lymphadenopathy.  In office cystoscopy showed bilobar hypertrophy with obstruction but no bladder masses.  His PSA remained stable at 7 in May 2023, 6.1 in April 2020 and 6.6 at most recent labs May 11, 2023.  A surveillance prostate MRI on 05/25/2023 showed a PI-RADS 5 lesion in the left anterior transition zone extending from the base through the apex, approximately 5.7 cc.     The patient proceeded to MRI fusion transrectal ultrasound with 15 biopsies of the prostate with Dr. Elisabeth on 08/08/2023.  The prostate volume measured 49.73 cc.  Out of 15 core biopsies, 7 were positive, all on the left.  The maximum Gleason score was 3+4, and this was seen in the left base, left mid, left  mid lateral, left apex lateral and 3 of 3 samples from the MRI ROI.    The patient reviewed the biopsy results with his urologist and he has kindly been referred today for discussion of potential radiation treatment options.   PREVIOUS RADIATION THERAPY: No  PAST MEDICAL HISTORY:  Past Medical History:  Diagnosis Date   Arthritis    Bradycardia    asymptomatic    History of kidney stones    Hypertension    Prostate cancer (HCC)       PAST SURGICAL HISTORY: Past Surgical History:  Procedure Laterality Date   COLONOSCOPY     CYSTOSCOPY WITH STENT PLACEMENT Right 12/28/2022   Procedure: CYSTOSCOPY WITH STENT PLACEMENT;  Surgeon: Carolee Sherwood [name], MD;  Location: [facility] OR;  Service: Urology;  Laterality: Right;   EXTRACORPOREAL SHOCK WAVE LITHOTRIPSY Right 01/25/2023   Procedure: EXTRACORPOREAL SHOCK WAVE LITHOTRIPSY (ESWL);  Surgeon: [name], MD;  Location: [facility] ORS;  Service: Urology;  Laterality: Right;   FLEXIBLE SIGMOIDOSCOPY N/A 04/02/2015   Procedure: FLEXIBLE SIGMOIDOSCOPY;  Surgeon: Claudis RAYMOND Rivet, MD;  Location: [facility] SUITE;  Service: Endoscopy;  Laterality: N/A;  1:45 - moved to 1/11 @ 8:30 - Ann to notify   KIDNEY STONE SURGERY     TOTAL KNEE ARTHROPLASTY Left 05/02/2017   Procedure: LEFT TOTAL KNEE ARTHROPLASTY;  Surgeon: Melodi Lerner, MD;  Location: [facility] ORS;  Service: Orthopedics;  Laterality: Left;    FAMILY HISTORY:  No family history on file.  SOCIAL HISTORY:  Social History   Socioeconomic History   Marital status: Legally Separated    Spouse name: Not on file   Number of children: Not on file   Years of education: Not on file   Highest education level: Not on file  Occupational History   Not on file  Tobacco Use   Smoking status: Former    Types: Cigarettes   Smokeless tobacco: Never   Tobacco comments:    quit at age 4   Vaping Use   Vaping status: Never Used  Substance and Sexual Activity   Alcohol use: No    Alcohol/week: 0.0 standard  drinks of alcohol   Drug use: No   Sexual activity: Not on file  Other Topics Concern   Not on file  Social History Narrative   Not on file   Social Drivers of Health   Financial Resource Strain: Not on file  Food Insecurity: No Food Insecurity (12/29/2022)   Hunger Vital Sign    Worried About Running Out of Food in the Last Year: Never true    Ran Out of Food in the Last Year: Never true  Transportation Needs: No Transportation Needs (12/29/2022)   PRAPARE - Administrator, Civil Service (Medical): No    Lack of Transportation (Non-Medical): No  Physical Activity: Not on file  Stress: Not on file  Social Connections: Not on file  Intimate Partner Violence: Not At Risk (12/29/2022)   Humiliation, Afraid, Rape, and Kick questionnaire    Fear of Current or Ex-Partner: No    Emotionally Abused: No    Physically Abused: No    Sexually Abused: No    ALLERGIES: Dilaudid [hydromorphone hcl] and Hydromorphone  MEDICATIONS:  Current Outpatient Medications  Medication Sig Dispense Refill   albuterol  (VENTOLIN  HFA) 108 (90 Base) MCG/ACT inhaler Inhale 2 puffs into the lungs every 6 (six) hours as needed for wheezing or shortness of breath.     allopurinol (ZYLOPRIM) 100 MG tablet Take 100 mg by mouth daily.     amLODipine  (NORVASC ) 5 MG tablet Take 1 tablet by mouth daily.     aspirin  81 MG chewable tablet Chew by mouth daily.     cetirizine (ZYRTEC) 10 MG tablet Take 10 mg by mouth at bedtime.     clobetasol cream (TEMOVATE) 0.05 % Apply 1 application  topically 2 (two) times daily.     clotrimazole -betamethasone  (LOTRISONE ) cream Apply 1 Application topically 2 (two) times daily. 42 g 1   cyclobenzaprine  (FLEXERIL ) 10 MG tablet Take 1 tablet (10 mg total) by mouth 3 (three) times daily as needed for muscle spasms. 20 tablet 0   diazepam  (VALIUM ) 10 MG tablet Take 1 tablet 2 hours before procedure (Patient not taking: Reported on 08/30/2023) 1 tablet 0   gabapentin   (NEURONTIN ) 400 MG capsule Take 400 mg by mouth 2 (two) times daily.     glipiZIDE (GLUCOTROL XL) 2.5 MG 24 hr tablet Take 2.5 mg by mouth daily.     Gluc-Chonn-MSM-Boswellia-Vit D (GLUCOSAMINE CHOND TRIPLE/VIT D PO) Take 2 tablets by mouth daily.     indomethacin (INDOCIN) 25 MG capsule Take 25 mg by mouth 3 (three) times daily.     loperamide  (IMODIUM ) 2 MG capsule Take 1 capsule (2 mg total) by mouth 2 (two) times daily. (Patient not taking: Reported on 08/30/2023) 30 capsule 0   meclizine (ANTIVERT) 25 MG tablet Take 25 mg by mouth 3 (three) times  daily as needed for dizziness. Take 1/2 to 1 tablet 3 times a day as needed for Dizziness     Melatonin 5 MG CAPS Take by mouth.     methocarbamol  (ROBAXIN ) 500 MG tablet Take 1 tablet (500 mg total) by mouth every 6 (six) hours as needed for muscle spasms. 80 tablet 0   metoprolol succinate (TOPROL-XL) 25 MG 24 hr tablet Take 25 mg by mouth 2 (two) times daily.     Multiple Vitamin (MULTIVITAMIN) tablet Take 1 tablet by mouth daily. (Patient not taking: Reported on 08/30/2023)     oxyCODONE  (OXY IR/ROXICODONE ) 5 MG immediate release tablet Take 1-2 tablets (5-10 mg total) by mouth every 4 (four) hours as needed for moderate pain or severe pain. (Patient not taking: Reported on 05/17/2023) 80 tablet 0   oxyCODONE -acetaminophen  (PERCOCET) 5-325 MG tablet Take 1 tablet by mouth every 6 (six) hours as needed. (Patient not taking: Reported on 08/30/2023) 15 tablet 0   rivaroxaban  (XARELTO ) 10 MG TABS tablet Take 1 tablet (10 mg total) by mouth daily with breakfast. Take Xarelto  for two and a half more weeks following discharge from the hospital, then discontinue Xarelto . Once the patient has completed the Xarelto , they may resume the 81 mg Aspirin . 19 tablet 0   rosuvastatin  (CRESTOR ) 5 MG tablet Take 5 mg by mouth daily.     triamcinolone  cream (KENALOG ) 0.1 % Apply 1 application topically 2 (two) times daily. 30 g 0   valACYclovir  (VALTREX ) 1000 MG tablet  Take 1 tablet (1,000 mg total) by mouth 3 (three) times daily. 21 tablet 0   No current facility-administered medications for this visit.      REVIEW OF SYSTEMS:  On review of systems, the patient reports that he is doing well overall. He denies any chest pain, shortness of breath, cough, fevers, chills, night sweats, unintended weight changes. He denies any bowel disturbances, and denies abdominal pain, nausea or vomiting. He denies any new musculoskeletal or joint aches or pains. His IPSS was Total Score: 14, indicating moderate urinary symptoms (Reference 0-7 mild, 8-19 moderate, 20-35 severe).  His SHIM: 10, indicating he moderate erectile dysfunction (Reference - 22-25 None, 17-21 Mild, 8-16 Moderate, 1-7 Severe). A complete review of systems is obtained and is otherwise negative.     PHYSICAL EXAM:  Wt Readings from Last 3 Encounters:  01/25/23 220 lb (99.8 kg)  01/24/23 214 lb 15.2 oz (97.5 kg)  12/28/22 215 lb (97.5 kg)   Temp Readings from Last 3 Encounters:  01/25/23 98.3 F (36.8 C)  12/29/22 97.8 F (36.6 C) (Oral)  06/28/21 98.7 F (37.1 C) (Oral)   BP Readings from Last 3 Encounters:  08/30/23 (!) 165/70  05/17/23 (!) 177/80  02/08/23 (!) 170/88   Pulse Readings from Last 3 Encounters:  08/30/23 70  02/08/23 (!) 51  01/25/23 79    /10  In general this is a well appearing Caucasian male in no acute distress. He's alert and oriented x4 and appropriate throughout the examination. Cardiopulmonary assessment is negative for acute distress, and he exhibits normal effort.     KPS = 100  100 - Normal; no complaints; no evidence of disease. 90   - Able to carry on normal activity; minor signs or symptoms of disease. 80   - Normal activity with effort; some signs or symptoms of disease. 31   - Cares for self; unable to carry on normal activity or to do active work. 60   - Requires occasional  assistance, but is able to care for most of his personal needs. 50   -  Requires considerable assistance and frequent medical care. 40   - Disabled; requires special care and assistance. 30   - Severely disabled; hospital admission is indicated although death not imminent. 20   - Very sick; hospital admission necessary; active supportive treatment necessary. 10   - Moribund; fatal processes progressing rapidly. 0     - Dead  Karnofsky DA, Abelmann WH, Craver LS and Burchenal [facility] [phone]) The use of the nitrogen mustards in the palliative treatment of carcinoma: with particular reference to bronchogenic carcinoma Cancer 1 634-56  LABORATORY DATA:  Lab Results  Component Value Date   WBC 36.7 (H) 12/29/2022   HGB 11.8 (L) 12/29/2022   HCT 35.3 (L) 12/29/2022   MCV 95.9 12/29/2022   PLT 254 12/29/2022   Lab Results  Component Value Date   NA 135 12/29/2022   K 5.5 (H) 12/29/2022   CL 103 12/29/2022   CO2 17 (L) 12/29/2022   Lab Results  Component Value Date   ALT 24 12/29/2022   AST 28 12/29/2022   ALKPHOS 129 (H) 12/29/2022   BILITOT 1.2 12/29/2022     RADIOGRAPHY: No results found.    IMPRESSION/PLAN: 1. 78 y.o. gentleman with Stage T1c adenocarcinoma of the prostate with Gleason Score of 3+4, and PSA of 6.6. We discussed the patient's workup and outlined the nature of prostate cancer in this setting. The patient's T stage, Gleason's score, and PSA put him into the favorable intermediate risk group. Accordingly, he is eligible for a variety of potential treatment options including continuing and active surveillance, brachytherapy, 5.5 weeks of external radiation or prostatectomy. We discussed the available radiation techniques, and focused on the details and logistics of delivery.  We discussed and outlined the risks, benefits, short and long-term effects associated with radiotherapy and compared and contrasted these with prostatectomy. We discussed the role of SpaceOAR gel in reducing the rectal toxicity associated with radiotherapy. He appears to have  a good understanding of his disease and our treatment recommendations which are of curative intent.  He was encouraged to ask questions that were answered to his stated satisfaction.  At the conclusion of our conversation, the patient is interested in moving forward with prostate seed implant.  I personally spent 60 minutes in this encounter including chart review, reviewing radiological studies, meeting face-to-face with the patient, entering orders and completing documentation.   ------------------------------------------------   Donnice Barge, MD [facility]  Radiation Oncology Direct Dial: [phone]  Fax: [phone] [email or web address]  Skype  LinkedIn

## 2023-10-11 ENCOUNTER — Telehealth: Payer: Self-pay

## 2023-10-11 ENCOUNTER — Ambulatory Visit
Admission: RE | Admit: 2023-10-11 | Discharge: 2023-10-11 | Disposition: A | Discharge status: Home / Self Care | Source: Ambulatory Visit | Attending: Radiation Oncology | Admitting: Radiation Oncology

## 2023-10-11 ENCOUNTER — Encounter: Payer: Self-pay | Admitting: Radiation Oncology

## 2023-10-11 VITALS — BP 171/85 | HR 68 | Temp 97.5°F | Resp 20 | Ht 71.0 in | Wt 243.4 lb

## 2023-10-11 DIAGNOSIS — I10 Essential (primary) hypertension: Secondary | ICD-10-CM | POA: Insufficient documentation

## 2023-10-11 DIAGNOSIS — Z7984 Long term (current) use of oral hypoglycemic drugs: Secondary | ICD-10-CM | POA: Insufficient documentation

## 2023-10-11 DIAGNOSIS — C61 Malignant neoplasm of prostate: Secondary | ICD-10-CM | POA: Diagnosis present

## 2023-10-11 DIAGNOSIS — Z7901 Long term (current) use of anticoagulants: Secondary | ICD-10-CM | POA: Insufficient documentation

## 2023-10-11 DIAGNOSIS — Z79899 Other long term (current) drug therapy: Secondary | ICD-10-CM | POA: Insufficient documentation

## 2023-10-11 DIAGNOSIS — Z87891 Personal history of nicotine dependence: Secondary | ICD-10-CM | POA: Diagnosis not present

## 2023-10-11 DIAGNOSIS — M129 Arthropathy, unspecified: Secondary | ICD-10-CM | POA: Diagnosis not present

## 2023-10-11 DIAGNOSIS — Z7982 Long term (current) use of aspirin: Secondary | ICD-10-CM | POA: Diagnosis not present

## 2023-10-11 DIAGNOSIS — Z191 Hormone sensitive malignancy status: Secondary | ICD-10-CM | POA: Diagnosis not present

## 2023-10-11 DIAGNOSIS — Z87442 Personal history of urinary calculi: Secondary | ICD-10-CM | POA: Insufficient documentation

## 2023-10-11 NOTE — Progress Notes (Signed)
 Introduced myself to the patient, and his wife, as the prostate nurse navigator.  No barriers to care identified at this time.  He is here to discuss his radiation treatment options and will proceed with brachytherapy.  I gave him my business card and asked him to call me with questions or concerns.  Verbalized understanding.

## 2023-10-17 ENCOUNTER — Telehealth: Payer: Self-pay | Admitting: *Deleted

## 2023-10-17 NOTE — Telephone Encounter (Signed)
 RETURNED PATIENT'S WIFE'S PHONE CALL, SPOKE WITH PATIENT'S WIFE- Marcus Simpson

## 2023-10-18 ENCOUNTER — Ambulatory Visit: Admitting: Urology

## 2023-10-18 ENCOUNTER — Telehealth: Payer: Self-pay | Admitting: *Deleted

## 2023-10-18 NOTE — Telephone Encounter (Signed)
 Called patient to update, spoke with patient.

## 2023-10-20 ENCOUNTER — Other Ambulatory Visit: Payer: Self-pay

## 2023-10-20 DIAGNOSIS — C61 Malignant neoplasm of prostate: Secondary | ICD-10-CM

## 2023-10-21 ENCOUNTER — Telehealth: Payer: Self-pay | Admitting: *Deleted

## 2023-10-21 NOTE — Telephone Encounter (Signed)
Called patient to inform of pre-seed appts. and implant date, spoke with patient and he is aware of these dates

## 2023-10-21 NOTE — Telephone Encounter (Signed)
 RETURNED PATIENT'S WIFE'S PHONE CALL (PAULA Fanning), LVM FOR A RETURN CALL

## 2023-10-27 DIAGNOSIS — N390 Urinary tract infection, site not specified: Secondary | ICD-10-CM | POA: Diagnosis not present

## 2023-10-27 DIAGNOSIS — M7732 Calcaneal spur, left foot: Secondary | ICD-10-CM | POA: Diagnosis not present

## 2023-10-27 DIAGNOSIS — I10 Essential (primary) hypertension: Secondary | ICD-10-CM | POA: Diagnosis not present

## 2023-10-27 DIAGNOSIS — E1140 Type 2 diabetes mellitus with diabetic neuropathy, unspecified: Secondary | ICD-10-CM | POA: Diagnosis not present

## 2023-10-27 DIAGNOSIS — M7731 Calcaneal spur, right foot: Secondary | ICD-10-CM | POA: Diagnosis not present

## 2023-10-27 DIAGNOSIS — M722 Plantar fascial fibromatosis: Secondary | ICD-10-CM | POA: Diagnosis not present

## 2023-10-27 DIAGNOSIS — Z299 Encounter for prophylactic measures, unspecified: Secondary | ICD-10-CM | POA: Diagnosis not present

## 2023-10-27 DIAGNOSIS — R52 Pain, unspecified: Secondary | ICD-10-CM | POA: Diagnosis not present

## 2023-10-27 DIAGNOSIS — I779 Disorder of arteries and arterioles, unspecified: Secondary | ICD-10-CM | POA: Diagnosis not present

## 2023-10-27 DIAGNOSIS — M79671 Pain in right foot: Secondary | ICD-10-CM | POA: Diagnosis not present

## 2023-10-27 DIAGNOSIS — M79672 Pain in left foot: Secondary | ICD-10-CM | POA: Diagnosis not present

## 2023-10-27 DIAGNOSIS — N200 Calculus of kidney: Secondary | ICD-10-CM | POA: Diagnosis not present

## 2023-10-27 DIAGNOSIS — R300 Dysuria: Secondary | ICD-10-CM | POA: Diagnosis not present

## 2023-10-30 ENCOUNTER — Emergency Department (HOSPITAL_COMMUNITY)
Admission: EM | Admit: 2023-10-30 | Discharge: 2023-10-30 | Disposition: A | Discharge status: Home / Self Care | Attending: Emergency Medicine | Admitting: Emergency Medicine

## 2023-10-30 ENCOUNTER — Emergency Department (HOSPITAL_COMMUNITY)

## 2023-10-30 ENCOUNTER — Encounter (HOSPITAL_COMMUNITY): Payer: Self-pay | Admitting: Emergency Medicine

## 2023-10-30 ENCOUNTER — Other Ambulatory Visit: Payer: Self-pay

## 2023-10-30 DIAGNOSIS — J189 Pneumonia, unspecified organism: Secondary | ICD-10-CM | POA: Diagnosis not present

## 2023-10-30 DIAGNOSIS — N200 Calculus of kidney: Secondary | ICD-10-CM | POA: Diagnosis not present

## 2023-10-30 DIAGNOSIS — R918 Other nonspecific abnormal finding of lung field: Secondary | ICD-10-CM | POA: Diagnosis not present

## 2023-10-30 DIAGNOSIS — E869 Volume depletion, unspecified: Secondary | ICD-10-CM | POA: Diagnosis not present

## 2023-10-30 DIAGNOSIS — R109 Unspecified abdominal pain: Secondary | ICD-10-CM | POA: Diagnosis not present

## 2023-10-30 DIAGNOSIS — J984 Other disorders of lung: Secondary | ICD-10-CM | POA: Diagnosis not present

## 2023-10-30 DIAGNOSIS — Z7982 Long term (current) use of aspirin: Secondary | ICD-10-CM | POA: Insufficient documentation

## 2023-10-30 DIAGNOSIS — R079 Chest pain, unspecified: Secondary | ICD-10-CM | POA: Diagnosis not present

## 2023-10-30 LAB — COMPREHENSIVE METABOLIC PANEL WITH GFR
ALT: 33 U/L (ref 0–44)
AST: 45 U/L — ABNORMAL HIGH (ref 15–41)
Albumin: 3.8 g/dL (ref 3.5–5.0)
Alkaline Phosphatase: 73 U/L (ref 38–126)
Anion gap: 10 (ref 5–15)
BUN: 25 mg/dL — ABNORMAL HIGH (ref 8–23)
CO2: 23 mmol/L (ref 22–32)
Calcium: 8.6 mg/dL — ABNORMAL LOW (ref 8.9–10.3)
Chloride: 104 mmol/L (ref 98–111)
Creatinine, Ser: 1.53 mg/dL — ABNORMAL HIGH (ref 0.61–1.24)
GFR, Estimated: 46 mL/min — ABNORMAL LOW (ref >60)
Glucose, Bld: 124 mg/dL — ABNORMAL HIGH (ref 70–99)
Potassium: 3.8 mmol/L (ref 3.5–5.1)
Sodium: 137 mmol/L (ref 135–145)
Total Bilirubin: 1.1 mg/dL (ref 0.0–1.2)
Total Protein: 7.3 g/dL (ref 6.5–8.1)

## 2023-10-30 LAB — URINALYSIS, ROUTINE W REFLEX MICROSCOPIC
Bilirubin Urine: NEGATIVE
Glucose, UA: NEGATIVE mg/dL
Hgb urine dipstick: NEGATIVE
Ketones, ur: NEGATIVE mg/dL
Leukocytes,Ua: NEGATIVE
Nitrite: NEGATIVE
Protein, ur: NEGATIVE mg/dL
Specific Gravity, Urine: 1.015 (ref 1.005–1.030)
pH: 5 (ref 5.0–8.0)

## 2023-10-30 LAB — CBC WITH DIFFERENTIAL/PLATELET
Abs Immature Granulocytes: 0.03 K/uL (ref 0.00–0.07)
Basophils Absolute: 0.1 K/uL (ref 0.0–0.1)
Basophils Relative: 1 %
Eosinophils Absolute: 0.4 K/uL (ref 0.0–0.5)
Eosinophils Relative: 5 %
HCT: 40.2 % (ref 39.0–52.0)
Hemoglobin: 13.4 g/dL (ref 13.0–17.0)
Immature Granulocytes: 0 %
Lymphocytes Relative: 26 %
Lymphs Abs: 2.6 K/uL (ref 0.7–4.0)
MCH: 31 pg (ref 26.0–34.0)
MCHC: 33.3 g/dL (ref 30.0–36.0)
MCV: 93.1 fL (ref 80.0–100.0)
Monocytes Absolute: 0.8 K/uL (ref 0.1–1.0)
Monocytes Relative: 9 %
Neutro Abs: 5.8 K/uL (ref 1.7–7.7)
Neutrophils Relative %: 59 %
Platelets: 239 K/uL (ref 150–400)
RBC: 4.32 MIL/uL (ref 4.22–5.81)
RDW: 13.1 % (ref 11.5–15.5)
WBC: 9.7 K/uL (ref 4.0–10.5)
nRBC: 0 % (ref 0.0–0.2)

## 2023-10-30 LAB — D-DIMER, QUANTITATIVE: D-Dimer, Quant: 0.86 ug{FEU}/mL — ABNORMAL HIGH (ref 0.00–0.50)

## 2023-10-30 MED ORDER — KETOROLAC TROMETHAMINE 30 MG/ML IJ SOLN
15.0000 mg | Freq: Once | INTRAMUSCULAR | Status: AC
  Administered 2023-10-30: 15 mg via INTRAVENOUS
  Filled 2023-10-30: qty 1

## 2023-10-30 MED ORDER — IOHEXOL 350 MG/ML SOLN
75.0000 mL | Freq: Once | INTRAVENOUS | Status: AC | PRN
  Administered 2023-10-30: 75 mL via INTRAVENOUS

## 2023-10-30 MED ORDER — ONDANSETRON HCL 4 MG/2ML IJ SOLN
4.0000 mg | Freq: Once | INTRAMUSCULAR | Status: AC
  Administered 2023-10-30: 4 mg via INTRAVENOUS
  Filled 2023-10-30: qty 2

## 2023-10-30 MED ORDER — DOXYCYCLINE HYCLATE 100 MG PO CAPS: 100.0000 mg | ORAL_CAPSULE | Freq: Two times a day (BID) | ORAL | 0 refills | Status: DC

## 2023-10-30 MED ORDER — OXYCODONE-ACETAMINOPHEN 5-325 MG PO TABS: 1.0000 | ORAL_TABLET | Freq: Four times a day (QID) | ORAL | 0 refills | Status: DC | PRN

## 2023-10-30 MED ORDER — MORPHINE SULFATE (PF) 2 MG/ML IV SOLN
2.0000 mg | Freq: Once | INTRAVENOUS | Status: AC
  Administered 2023-10-30: 2 mg via INTRAVENOUS
  Filled 2023-10-30: qty 1

## 2023-10-30 NOTE — ED Notes (Signed)
 Patient transported to X-ray

## 2023-10-30 NOTE — ED Notes (Signed)
 Patient transported to CT

## 2023-10-30 NOTE — ED Triage Notes (Signed)
 Pt to the ED POV with complaints of left side flank pain that began yesterday Pt states he has decreased urination.

## 2023-10-30 NOTE — Discharge Instructions (Signed)
 Follow up with your md in 1 week.    Take the pain meds if tylenol  alone does not help your pain

## 2023-10-30 NOTE — ED Provider Notes (Signed)
 [place] [facility]PART[facility] AT [facility] Provider Note   CSN: [id number] Arrival date & time: 10/30/23  9357     Patient presents with: Flank Pain   Marcus Simpson is a 78 y.o. male.  {Add pertinent medical, surgical, social history, OB history to [id number]} Patient complain of left flank pain.  He stated on Cipro  for a UTI.   Flank Pain       Prior to Admission medications   Medication Sig Start Date End Date Taking? Authorizing Provider  doxycycline  (VIBRAMYCIN ) 100 MG capsule Take 1 capsule (100 mg total) by mouth 2 (two) times daily. One po bid x 7 days 10/30/23  Yes Glorene Leitzke, MD  oxyCODONE -acetaminophen  (PERCOCET/ROXICET) 5-325 MG tablet Take 1 tablet by mouth every 6 (six) hours as needed for severe pain (pain score 7-10). 10/30/23  Yes Suzette Pac, MD  albuterol  (VENTOLIN  HFA) 108 (90 Base) MCG/ACT inhaler Inhale 2 puffs into the lungs every 6 (six) hours as needed for wheezing or shortness of breath. 12/25/22   [provider]  allopurinol (ZYLOPRIM) 100 MG tablet Take 100 mg by mouth daily.    [provider]  amLODipine  (NORVASC ) 5 MG tablet Take 1 tablet by mouth daily.    [provider]  aspirin  81 MG chewable tablet Chew by mouth daily.    [provider]  Cholecalciferol (VITAMIN D3) 50 MCG (2000 UT) TABS Take by mouth.    [provider]  clotrimazole -betamethasone  (LOTRISONE ) cream apply TOPICALLY TWICE DAILY 10/11/23   [name], MD  cyanocobalamin (VITAMIN B12) 1000 MCG tablet Take 1,000 mcg by mouth daily.    [provider]  ELDERBERRY PO Take by mouth daily.    [provider]  glipiZIDE (GLUCOTROL XL) 2.5 MG 24 hr tablet Take 2.5 mg by mouth daily. 11/26/22   [provider]  Gluc-Chonn-MSM-Boswellia-Vit D (GLUCOSAMINE CHOND TRIPLE/VIT D PO) Take 2 tablets by mouth daily.    [provider]  indomethacin (INDOCIN) 25 MG capsule Take 25 mg by mouth  3 (three) times daily. 05/20/21   [provider]  meclizine (ANTIVERT) 25 MG tablet Take 25 mg by mouth 3 (three) times daily as needed for dizziness. Take 1/2 to 1 tablet 3 times a day as needed for Dizziness    [provider]  Melatonin 5 MG CAPS Take by mouth.    [provider]  metoprolol succinate (TOPROL-XL) 25 MG 24 hr tablet Take 25 mg by mouth 2 (two) times daily. 09/01/20   [provider]  Misc Natural Products (TURMERIC, CURCUMIN, PO) Take 250 mg by mouth daily.    [provider]  Omega-3 Fatty Acids (OMEGA-3 FISH OIL) 300 MG CAPS Take by mouth.    [provider]  ONETOUCH ULTRA TEST test strip 1 each daily. 07/15/23   [provider]  rosuvastatin  (CRESTOR ) 5 MG tablet Take 5 mg by mouth daily.    [provider]    Allergies: Dilaudid [hydromorphone hcl] and Hydromorphone    Review of Systems  Genitourinary:  Positive for flank pain.    Updated Vital Signs BP (!) 148/69   Pulse (!) 58   Temp 97.8 F (36.6 C) (Oral)   Resp 15   Ht 5' 11 (1.803 m)   Wt 110 kg   SpO2 94%   BMI 33.82 kg/m   Physical Exam  (all labs ordered are listed, but only abnormal results are displayed) Labs Reviewed  COMPREHENSIVE METABOLIC PANEL WITH  GFR - Abnormal; Notable for the following components:      Result Value   Glucose, Bld 124 (*)    BUN 25 (*)    Creatinine, Ser 1.53 (*)    Calcium  8.6 (*)    AST 45 (*)    GFR, Estimated 46 (*)    All other components within normal limits  D-DIMER, QUANTITATIVE - Abnormal; Notable for the following components:   D-Dimer, Quant 0.86 (*)    All other components within normal limits  CBC WITH DIFFERENTIAL/PLATELET  URINALYSIS, ROUTINE W REFLEX MICROSCOPIC    EKG: None  Radiology: CT Angio Chest PE W and/or Wo Contrast Result Date: 10/30/2023 CLINICAL DATA:  Left flank pain.  Decreased urination. EXAM: CT ANGIOGRAPHY CHEST WITH CONTRAST TECHNIQUE: Multidetector CT  imaging of the chest was performed using the standard protocol during bolus administration of intravenous contrast. Multiplanar CT image reconstructions and MIPs were obtained to evaluate the vascular anatomy. RADIATION DOSE REDUCTION: This exam was performed according to the departmental dose-optimization program which includes automated exposure control, adjustment of the mA and/or kV according to patient size and/or use of iterative reconstruction technique. CONTRAST:  75mL OMNIPAQUE  IOHEXOL  350 MG/ML SOLN COMPARISON:  12/28/2022. FINDINGS: Cardiovascular: Negative for pulmonary embolus. Atherosclerotic calcification of the aorta. Heart is enlarged. No pericardial effusion. Mediastinum/Nodes: No pathologically enlarged mediastinal, hilar or axillary lymph nodes. Esophagus is grossly unremarkable. Lungs/Pleura: Image quality is degraded by expiratory phase imaging and respiratory motion. Chronic subsegmental volume loss in the anterior segment right upper lobe. Dependent atelectasis. Mild patchy peribronchovascular ground-glass and minimal consolidation in the anterior segment left upper lobe and lingula. Lingular scarring. Additional areas of mild perihilar ground-glass bilaterally. No pleural fluid. Airway is unremarkable. Upper Abdomen: Liver margin is mildly irregular. Numerous small bilateral renal stones. Low-attenuation lesions in the left kidney. No specific follow-up necessary. Visualized portions of the liver, gallbladder, adrenal glands, kidneys, spleen, pancreas, stomach and bowel are otherwise grossly unremarkable. No upper abdominal adenopathy. Musculoskeletal: Degenerative changes in the spine. T12 and L1 compression fractures, old. Osteopenia. Review of the MIP images confirms the above findings. IMPRESSION: 1. Negative for pulmonary embolus. 2. Patchy areas of mild ground-glass and minimal consolidation in the perihilar regions and left upper lobe, suggesting possibility of pneumonia. 3. Liver  appears mildly cirrhotic. 4. Bilateral renal stones. 5.  Aortic atherosclerosis (ICD10-I70.0). Electronically Signed   By: Newell Eke M.D.   On: 10/30/2023 10:30   DG Chest 2 View Result Date: 10/30/2023 CLINICAL DATA:  Pain. EXAM: CHEST - 2 VIEW COMPARISON:  12/29/2022 FINDINGS: Cardiopericardial silhouette is at upper limits of normal for size. Interstitial markings are diffusely coarsened with chronic features. No focal airspace disease in the right lung. Patchy airspace opacity at the left base raises the question of atelectasis or infiltrate although a component of this may opacity on the frontal projection be related to a juxta cardiac fat pad. No pleural effusion. No acute bony abnormality. Telemetry leads overlie the chest. IMPRESSION: Patchy airspace opacity at the left base raises the question of atelectasis or infiltrate although a component of this opacity may be related to a juxta cardiac fat pad. Electronically Signed   By: Camellia Candle M.D.   On: 10/30/2023 09:19   CT Renal Stone Study Result Date: 10/30/2023 EXAM: CT ABDOMEN AND PELVIS WITHOUT CONTRAST 10/30/2023 08:01:49 AM TECHNIQUE: CT of the abdomen and pelvis was performed without the administration of intravenous contrast. Multiplanar reformatted images are provided for review. Automated exposure control, iterative reconstruction,  and/or weight based adjustment of the mA/kV was utilized to reduce the radiation dose to as low as reasonably achievable. COMPARISON: CT of the abdomen and pelvis 12/28/2022. CLINICAL HISTORY: Abdominal/flank pain, stone suspected; with complaints of left side flank pain that began yesterday. Patient states he has decreased urination. FINDINGS: LOWER CHEST: Mild dependent atelectasis is present about the lungs. LIVER: The liver is unremarkable. GALLBLADDER AND BILE DUCTS: Gallbladder is unremarkable. No biliary ductal dilatation. SPLEEN: No acute abnormality. PANCREAS: No acute abnormality. ADRENAL GLANDS:  No acute abnormality. KIDNEYS, URETERS AND BLADDER: Multiple nonobstructing stones are present in both kidneys. The largest stone in the right kidney is at the upper pole measuring 9.3 mm, similar to the prior exam. Nonobstructing stones at the upper pole of the left kidney have slightly increased in size now measuring 7 to 8 mm. Smaller nonobstructing stones are present at the lower pole of both kidneys. No hydronephrosis. No perinephric or periureteral stranding. Urinary bladder is unremarkable. GI AND BOWEL: Stomach demonstrates no acute abnormality. There is no bowel obstruction. No bowel wall thickening. PERITONEUM AND RETROPERITONEUM: No ascites. No free air. VASCULATURE: Aorta is normal in caliber. LYMPH NODES: No lymphadenopathy. REPRODUCTIVE ORGANS: No acute abnormality. BONES AND SOFT TISSUES: Degenerative changes are again noted in the lumbar spine with grade 1 anterolisthesis and advanced facet hypertrophy bilaterally at L4-5. Remote superior endplate fractures at T12 and L1 are stable. Transitional S1 segment is noted. No acute osseous abnormality. No focal soft tissue abnormality. IMPRESSION: 1. Multiple nonobstructing stones in both kidneys, with the largest in the right kidney measuring 9.3 mm (stable) and the largest in the left kidney measuring 7 to 8 mm (slightly increased in size). No hydronephrosis or perinephric/ periureteral stranding. 2. No acute findings. Electronically signed by: Lonni Necessary MD 10/30/2023 08:29 AM EDT RP Workstation: [id number]    {Document cardiac monitor, telemetry assessment procedure when appropriate:32947} Procedures   Medications Ordered in the ED  ketorolac  (TORADOL ) 30 MG/ML injection 15 mg (15 mg Intravenous Given 10/30/23 0741)  ondansetron  (ZOFRAN ) injection 4 mg (4 mg Intravenous Given 10/30/23 0741)  morphine  (PF) 2 MG/ML injection 2 mg (2 mg Intravenous Given 10/30/23 0742)  iohexol  (OMNIPAQUE ) 350 MG/ML injection 75 mL (75 mLs Intravenous  Contrast Given 10/30/23 1014)   CT angio of the chest suggest pneumonia in his left lung.   {Click here for ABCD2, HEART and other calculators REFRESH Note before signing:1}                              Medical Decision Making Amount and/or Complexity of Data Reviewed Labs: ordered. Radiology: ordered.  Risk Prescription drug management.   Patient with possible pneumonia and urinary tract infection.  He is on Cipro .  We will add doxycycline  and he will follow-up with PCP  {Document critical care time when appropriate  Document review of labs and clinical decision tools ie CHADS2VASC2, etc  Document your independent review of radiology images and any outside records  Document your discussion with family members, caretakers and with consultants  Document social determinants of health affecting pt's care  Document your decision making why or why not admission, treatments were needed:32947:::1}   Final diagnoses:  Community acquired pneumonia of left lung, unspecified part of lung    ED Discharge Orders          Ordered    doxycycline  (VIBRAMYCIN ) 100 MG capsule  2 times daily  10/30/23 1057    oxyCODONE -acetaminophen  (PERCOCET/ROXICET) 5-325 MG tablet  Every 6 hours PRN        10/30/23 1057

## 2023-10-31 DIAGNOSIS — R52 Pain, unspecified: Secondary | ICD-10-CM | POA: Diagnosis not present

## 2023-10-31 DIAGNOSIS — M25552 Pain in left hip: Secondary | ICD-10-CM | POA: Diagnosis not present

## 2023-10-31 DIAGNOSIS — I10 Essential (primary) hypertension: Secondary | ICD-10-CM | POA: Diagnosis not present

## 2023-10-31 DIAGNOSIS — I779 Disorder of arteries and arterioles, unspecified: Secondary | ICD-10-CM | POA: Diagnosis not present

## 2023-10-31 DIAGNOSIS — Z299 Encounter for prophylactic measures, unspecified: Secondary | ICD-10-CM | POA: Diagnosis not present

## 2023-11-14 ENCOUNTER — Telehealth: Payer: Self-pay | Admitting: *Deleted

## 2023-11-14 NOTE — Telephone Encounter (Signed)
 RETURNED PATIENT'S PHONE CALL, LVM FOR A RETURN CALL

## 2023-12-02 NOTE — Progress Notes (Addendum)
 Pre-seed nursing interview for a diagnosis of 78 y.o. gentleman with Stage T1c adenocarcinoma of the prostate with Gleason score of 3+4, and PSA of 6.6.   Patient identity verified x2.   Patient states issues as follows...  -Pain: 0/10 -Fatigue: No -Abdomen: No -Groin: No -Urinary: No -Bowels: No -Appetite: Good -Weight:  245 lbs  Patient denies all other related issues at this time.  Meaningful use complete.  Urinary Management medication(s)- Not taking Tamsulosin . Urology appointment date-  Marcus Simpson 01/04/2024 8:40 am.  No vitals needed for this visit.  This concludes the interaction.

## 2023-12-02 NOTE — Progress Notes (Signed)
 RN spoke with patient and patient's wife.  Reviewed up coming appointments.  All questions answered.  Plan of care in progress.

## 2023-12-06 NOTE — Progress Notes (Signed)
  Radiation Oncology         [phone] ________________________________  Name: Marcus Simpson MRN: [id number]  Date: 12/08/2023  DOB: [birth year]  SIMULATION AND TREATMENT PLANNING NOTE PUBIC ARCH STUDY Definitive Seed Implant as Monotherapy  RR:Cbjd, Leta NOVAK, MD  [name], MD  DIAGNOSIS: 78 y.o. gentleman with Stage T1c adenocarcinoma of the prostate with Gleason score of 3+4, and PSA of 6.6.   Oncology History  Malignant neoplasm of prostate (HCC)  03/10/2015 Initial Diagnosis   Malignant neoplasm of prostate (HCC)   08/08/2023 Cancer Staging   Staging form: Prostate, AJCC 8th Edition - Clinical stage from 08/08/2023: Stage IIB (cT1c, cN0, cM0, PSA: 6.6, Grade Group: 2) - Signed by Sherwood Rise, PA-C on 10/07/2023 Histopathologic type: Adenocarcinoma, NOS Stage prefix: Initial diagnosis Prostate specific antigen (PSA) range: Less than 10 Gleason primary pattern: 3 Gleason secondary pattern: 4 Gleason score: 7 Histologic grading system: 5 grade system Number of biopsy cores examined: 15 Number of biopsy cores positive: 7 Location of positive needle core biopsies: One side       ICD-10-CM   1. Malignant neoplasm of prostate (HCC)  C61       COMPLEX SIMULATION:  The patient presented today for evaluation for possible prostate seed implant. He was brought to the radiation planning suite and placed supine on the CT couch. A 3-dimensional image study set was obtained in upload to the planning computer. There, on each axial slice, I contoured the prostate gland. Then, using three-dimensional radiation planning tools I reconstructed the prostate in view of the structures from the transperineal needle pathway to assess for possible pubic arch interference. In doing so, I did not appreciate any pubic arch interference. Also, the patient's prostate volume was estimated based on the drawn structure. The volume was 48 cc.  Given the pubic arch appearance and prostate volume,  patient remains a good candidate to proceed with prostate seed implant. Today, he freely provided informed written consent to proceed.    PLAN: The patient will undergo prostate seed implant to 145 Gy.   ________________________________  Donnice LABOR. Patrcia, M.D.

## 2023-12-06 NOTE — Progress Notes (Incomplete)
 Radiation Oncology         [phone]) [phone] ________________________________  Outpatient Follow up- Pre-seed visit  Name: Marcus Simpson MRN: [id number]  Date: 12/08/2023  DOB: 1945/12/20  RR:Cbjd, Leta NOVAK, MD  [name], MD   REFERRING PHYSICIAN: [name], MD  DIAGNOSIS: 78 y.o. gentleman with Stage T*** adenocarcinoma of the prostate with Gleason score of ***+***, and PSA of ***.    ICD-10-CM   1. Malignant neoplasm of prostate (HCC)  C61       HISTORY OF PRESENT ILLNESS: Marcus Simpson is a 78 y.o. male with a diagnosis of prostate cancer. He was noted to have an elevated PSA of *** by his primary care physician, Dr. FERNAND  Accordingly, he was referred for evaluation in urology by Dr. PIERRETTE on ***,  digital rectal examination was performed at that time revealing ***.  The patient proceeded to transrectal ultrasound with 12 biopsies of the prostate on ***.  The prostate volume measured *** cc.  Out of 12 core biopsies, *** were positive.  The maximum Gleason score was ***, and this was seen in ***.  The patient reviewed the biopsy results with his urologist and was kindly referred to us  for discussion of potential radiation treatment options. We initially met the patient on *** and he was most interested in proceeding with brachytherapy and SpaceOAR gel placement for treatment of his disease. He is here today for his pre-procedure imaging for planning and to answer any additional questions he may have about this treatment.   PREVIOUS RADIATION THERAPY: {EXAM; YES/NO:19492::No}  PAST MEDICAL HISTORY:  Past Medical History:  Diagnosis Date   Arthritis    Bradycardia    asymptomatic    History of kidney stones    Hypertension    Prostate cancer (HCC)       PAST SURGICAL HISTORY: Past Surgical History:  Procedure Laterality Date   COLONOSCOPY     CYSTOSCOPY WITH STENT PLACEMENT Right 12/28/2022   Procedure: CYSTOSCOPY WITH STENT PLACEMENT;  Surgeon: Carolee Sherwood [name], MD;  Location: [facility] OR;  Service: Urology;  Laterality: Right;   EXTRACORPOREAL SHOCK WAVE LITHOTRIPSY Right 01/25/2023   Procedure: EXTRACORPOREAL SHOCK WAVE LITHOTRIPSY (ESWL);  Surgeon: [name], MD;  Location: [facility];  Service: Urology;  Laterality: Right;   FLEXIBLE SIGMOIDOSCOPY N/A 04/02/2015   Procedure: FLEXIBLE SIGMOIDOSCOPY;  Surgeon: Claudis RAYMOND Rivet, MD;  Location: [facility];  Service: Endoscopy;  Laterality: N/A;  1:45 - moved to 1/11 @ 8:30 - Ann to notify   KIDNEY STONE SURGERY     TOTAL KNEE ARTHROPLASTY Left 05/02/2017   Procedure: LEFT TOTAL KNEE ARTHROPLASTY;  Surgeon: Melodi Lerner, MD;  Location: [facility];  Service: Orthopedics;  Laterality: Left;    FAMILY HISTORY: No family history on file.  SOCIAL HISTORY:  Social History   Socioeconomic History   Marital status: Married    Spouse name: Not on file   Number of children: Not on file   Years of education: Not on file   Highest education level: Not on file  Occupational History   Not on file  Tobacco Use   Smoking status: Former    Types: Cigarettes   Smokeless tobacco: Never   Tobacco comments:    quit at age 70   Vaping Use   Vaping status: Never Used  Substance and Sexual Activity   Alcohol use: No    Alcohol/week: 0.0 standard drinks of alcohol   Drug use: No  Sexual activity: Not on file  Other Topics Concern   Not on file  Social History Narrative   Not on file   Social Drivers of Health   Financial Resource Strain: Not on file  Food Insecurity: No Food Insecurity (10/11/2023)   Hunger Vital Sign    Worried About Running Out of Food in the Last Year: Never true    Ran Out of Food in the Last Year: Never true  Transportation Needs: No Transportation Needs (10/11/2023)   PRAPARE - Administrator, Civil Service (Medical): No    Lack of Transportation (Non-Medical): No  Physical Activity: Not on file  Stress: Not on file  Social Connections: Not on file   Intimate Partner Violence: Not At Risk (10/11/2023)   Humiliation, Afraid, Rape, and Kick questionnaire    Fear of Current or Ex-Partner: No    Emotionally Abused: No    Physically Abused: No    Sexually Abused: No    ALLERGIES: Dilaudid [hydromorphone hcl] and Hydromorphone  MEDICATIONS:  Current Outpatient Medications  Medication Sig Dispense Refill   albuterol  (VENTOLIN  HFA) 108 (90 Base) MCG/ACT inhaler Inhale 2 puffs into the lungs every 6 (six) hours as needed for wheezing or shortness of breath.     allopurinol (ZYLOPRIM) 100 MG tablet Take 100 mg by mouth daily.     amLODipine  (NORVASC ) 5 MG tablet Take 1 tablet by mouth daily.     aspirin  81 MG chewable tablet Chew by mouth daily.     Cholecalciferol (VITAMIN D3) 50 MCG (2000 UT) TABS Take by mouth.     clotrimazole -betamethasone  (LOTRISONE ) cream apply TOPICALLY TWICE DAILY 42 g 1   cyanocobalamin (VITAMIN B12) 1000 MCG tablet Take 1,000 mcg by mouth daily.     doxycycline  (VIBRAMYCIN ) 100 MG capsule Take 1 capsule (100 mg total) by mouth 2 (two) times daily. One po bid x 7 days 14 capsule 0   ELDERBERRY PO Take by mouth daily.     glipiZIDE (GLUCOTROL XL) 2.5 MG 24 hr tablet Take 2.5 mg by mouth daily.     Gluc-Chonn-MSM-Boswellia-Vit D (GLUCOSAMINE CHOND TRIPLE/VIT D PO) Take 2 tablets by mouth daily.     indomethacin (INDOCIN) 25 MG capsule Take 25 mg by mouth 3 (three) times daily.     meclizine (ANTIVERT) 25 MG tablet Take 25 mg by mouth 3 (three) times daily as needed for dizziness. Take 1/2 to 1 tablet 3 times a day as needed for Dizziness     Melatonin 5 MG CAPS Take by mouth.     metoprolol succinate (TOPROL-XL) 25 MG 24 hr tablet Take 25 mg by mouth 2 (two) times daily.     Misc Natural Products (TURMERIC, CURCUMIN, PO) Take 250 mg by mouth daily.     Omega-3 Fatty Acids (OMEGA-3 FISH OIL) 300 MG CAPS Take by mouth.     ONETOUCH ULTRA TEST test strip 1 each daily.     oxyCODONE -acetaminophen  (PERCOCET/ROXICET)  5-325 MG tablet Take 1 tablet by mouth every 6 (six) hours as needed for severe pain (pain score 7-10). 15 tablet 0   rosuvastatin  (CRESTOR ) 5 MG tablet Take 5 mg by mouth daily.     No current facility-administered medications for this visit.    REVIEW OF SYSTEMS:  On review of systems, the patient reports that he is doing well overall. He denies any chest pain, shortness of breath, cough, fevers, chills, night sweats, unintended weight changes. He denies any bowel disturbances, and denies abdominal  pain, nausea or vomiting. He denies any new musculoskeletal or joint aches or pains. His IPSS was ***, indicating *** urinary symptoms. His SHIM was ***, indicating he {does not have/has mild/moderate/severe} erectile dysfunction. A complete review of systems is obtained and is otherwise negative.    PHYSICAL EXAM:  Wt Readings from Last 3 Encounters:  10/30/23 242 lb 8.1 oz (110 kg)  10/11/23 243 lb 6.4 oz (110.4 kg)  01/25/23 220 lb (99.8 kg)   Temp Readings from Last 3 Encounters:  10/30/23 97.8 F (36.6 C) (Oral)  10/11/23 (!) 97.5 F (36.4 C)  01/25/23 98.3 F (36.8 C)   BP Readings from Last 3 Encounters:  10/30/23 (!) 146/70  10/11/23 (!) 171/85  08/30/23 (!) 165/70   Pulse Readings from Last 3 Encounters:  10/30/23 (!) 57  10/11/23 68  08/30/23 70    /10  In general this is a well appearing *** male in no acute distress. He's alert and oriented x4 and appropriate throughout the examination. Cardiopulmonary assessment is negative for acute distress, and he exhibits normal effort.     KPS = ***  100 - Normal; no complaints; no evidence of disease. 90   - Able to carry on normal activity; minor signs or symptoms of disease. 80   - Normal activity with effort; some signs or symptoms of disease. 20   - Cares for self; unable to carry on normal activity or to do active work. 60   - Requires occasional assistance, but is able to care for most of his personal needs. 50   -  Requires considerable assistance and frequent medical care. 40   - Disabled; requires special care and assistance. 30   - Severely disabled; hospital admission is indicated although death not imminent. 20   - Very sick; hospital admission necessary; active supportive treatment necessary. 10   - Moribund; fatal processes progressing rapidly. 0     - Dead  Karnofsky DA, Abelmann WH, Craver LS and Burchenal [facility] [phone]) The use of the nitrogen mustards in the palliative treatment of carcinoma: with particular reference to bronchogenic carcinoma Cancer 1 634-56  LABORATORY DATA:  Lab Results  Component Value Date   WBC 9.7 10/30/2023   HGB 13.4 10/30/2023   HCT 40.2 10/30/2023   MCV 93.1 10/30/2023   PLT 239 10/30/2023   Lab Results  Component Value Date   NA 137 10/30/2023   K 3.8 10/30/2023   CL 104 10/30/2023   CO2 23 10/30/2023   Lab Results  Component Value Date   ALT 33 10/30/2023   AST 45 (H) 10/30/2023   ALKPHOS 73 10/30/2023   BILITOT 1.1 10/30/2023     RADIOGRAPHY: No results found.    IMPRESSION/PLAN: 1. 77 y.o. gentleman with Stage T*** adenocarcinoma of the prostate with Gleason Score of ***+***, and PSA of ***. The patient has elected to proceed with seed implant for treatment of his disease. We reviewed the risks, benefits, short and long-term effects associated with brachytherapy and discussed the role of SpaceOAR in reducing the rectal toxicity associated with radiotherapy.  He appears to have a good understanding of his disease and our treatment recommendations which are of curative intent.  He was encouraged to ask questions that were answered to his stated satisfaction. He has freely signed written consent to proceed today in the office and a copy of this document will be placed in his medical record. His procedure is tentatively scheduled for *** in collaboration with Dr. PIERRETTE  and we will see him back for his post-procedure visit approximately 3 weeks thereafter. We  look forward to continuing to participate in his care. He knows that he is welcome to call with any questions or concerns at any time in the interim.  I personally spent 30 minutes in this encounter including chart review, reviewing radiological studies, meeting face-to-face with the patient, entering orders and completing documentation.    Sabra MICAEL Rusk, MMS, PA-C [place]  [facility] Radiation Oncology Physician Assistant Direct Dial: [phone]  Fax: [phone]

## 2023-12-07 ENCOUNTER — Telehealth: Payer: Self-pay | Admitting: *Deleted

## 2023-12-07 NOTE — Telephone Encounter (Signed)
 CALLED PATIENT TO REMIND OF PRE-SEED APPTS. FOR 12-08-23, SPOKE WITH PATIENT AND HE IS AWARE OF THESE APPTS.

## 2023-12-08 ENCOUNTER — Ambulatory Visit
Admission: RE | Admit: 2023-12-08 | Discharge: 2023-12-08 | Disposition: A | Discharge status: Home / Self Care | Source: Ambulatory Visit | Attending: Radiation Oncology | Admitting: Radiation Oncology

## 2023-12-08 ENCOUNTER — Encounter: Payer: Self-pay | Admitting: Radiation Oncology

## 2023-12-08 ENCOUNTER — Ambulatory Visit
Admission: RE | Admit: 2023-12-08 | Discharge: 2023-12-08 | Disposition: A | Discharge status: Home / Self Care | Payer: Self-pay | Source: Ambulatory Visit | Attending: Urology | Admitting: Urology

## 2023-12-08 VITALS — Wt 245.0 lb

## 2023-12-08 DIAGNOSIS — C61 Malignant neoplasm of prostate: Secondary | ICD-10-CM | POA: Insufficient documentation

## 2023-12-08 DIAGNOSIS — Z191 Hormone sensitive malignancy status: Secondary | ICD-10-CM | POA: Diagnosis not present

## 2023-12-08 NOTE — Progress Notes (Addendum)
 Radiation Oncology         [id number]) [phone] ________________________________  Outpatient Follow up- Pre-seed visit  Name: Marcus Simpson MRN: [id number]  Date: 12/08/2023  DOB: 25-Nov-1945  RR:Cbjd, Leta NOVAK, MD  [name], MD   REFERRING PHYSICIAN: [name], MD  DIAGNOSIS: 78 y.o. gentleman with Stage T1c adenocarcinoma of the prostate with Gleason score of 3+3, and PSA of 6.6.    ICD-10-CM   1. Malignant neoplasm of prostate (HCC)  C61       HISTORY OF PRESENT ILLNESS: Marcus Simpson is a 78 y.o. male with a diagnosis of prostate cancer. He was initially diagnosed with Gleason 3+3 prostate cancer on biopsy in October 2016 with a PSA of 5.9.  He elected to proceed with active surveillance and has continued in routine follow-up under the care of direction of Dr. Matilda.  Surveillance prostate MRI and biopsy in 2018 showed stable Gleason 3+3 disease.  His PSA remained stable at 6.1 in July 2020 and repeat prostate MRI and biopsy were recommended at that time but not completed.  His PSA was further elevated at 7.0 on routine labs with his PCP in June 2022.  This was further evaluated with CT A/P in August 2022 which showed numerous bilateral nonobstructing renal calculi but no concerning findings or lymphadenopathy.  In office cystoscopy showed bilobar hypertrophy with obstruction but no bladder masses.  His PSA remained stable at 7 in May 2023, 6.1 in April 2020 and 6.6 at most recent labs May 11, 2023.  A surveillance prostate MRI on 05/25/2023 showed a PI-RADS 5 lesion in the left anterior transition zone extending from the base through the apex, approximately 5.7 cc.       The patient proceeded to MRI fusion transrectal ultrasound with 15 biopsies of the prostate with Dr. Elisabeth on 08/08/2023.  The prostate volume measured 49.73 cc.  Out of 15 core biopsies, 7 were positive, all on the left.  The maximum Gleason score was 3+4, and this was seen in the left base, left  mid, left mid lateral, left apex lateral and 3 of 3 samples from the MRI ROI.     The patient reviewed the biopsy results with his urologist and was kindly referred to us  for discussion of potential radiation treatment options. We initially met the patient on 10/11/2023 and he was most interested in proceeding with brachytherapy and SpaceOAR gel placement for treatment of his disease. He is here today for his pre-procedure imaging for planning and to answer any additional questions he may have about this treatment.   PREVIOUS RADIATION THERAPY: No  PAST MEDICAL HISTORY:  Past Medical History:  Diagnosis Date   Arthritis    Bradycardia    asymptomatic    History of kidney stones    Hypertension    Prostate cancer (HCC)       PAST SURGICAL HISTORY: Past Surgical History:  Procedure Laterality Date   COLONOSCOPY     CYSTOSCOPY WITH STENT PLACEMENT Right 12/28/2022   Procedure: CYSTOSCOPY WITH STENT PLACEMENT;  Surgeon: Carolee Sherwood [name], MD;  Location: [facility] OR;  Service: Urology;  Laterality: Right;   EXTRACORPOREAL SHOCK WAVE LITHOTRIPSY Right 01/25/2023   Procedure: EXTRACORPOREAL SHOCK WAVE LITHOTRIPSY (ESWL);  Surgeon: [name], MD;  Location: [facility];  Service: Urology;  Laterality: Right;   FLEXIBLE SIGMOIDOSCOPY N/A 04/02/2015   Procedure: FLEXIBLE SIGMOIDOSCOPY;  Surgeon: Claudis RAYMOND Rivet, MD;  [facility];  Service: Endoscopy;  Laterality: N/A;  1:45 -  moved to 1/11 @ 8:30 - Jenkins to notify   KIDNEY STONE SURGERY     TOTAL KNEE ARTHROPLASTY Left 05/02/2017   Procedure: LEFT TOTAL KNEE ARTHROPLASTY;  Surgeon: Melodi Lerner, MD;  Location: [facility];  Service: Orthopedics;  Laterality: Left;    FAMILY HISTORY: No family history on file.  SOCIAL HISTORY:  Social History   Socioeconomic History   Marital status: Married    Spouse name: Not on file   Number of children: Not on file   Years of education: Not on file   Highest education level: Not on file   Occupational History   Not on file  Tobacco Use   Smoking status: Former    Types: Cigarettes   Smokeless tobacco: Never   Tobacco comments:    quit at age 63   Vaping Use   Vaping status: Never Used  Substance and Sexual Activity   Alcohol use: No    Alcohol/week: 0.0 standard drinks of alcohol   Drug use: No   Sexual activity: Not Currently  Other Topics Concern   Not on file  Social History Narrative   Not on file   Social Drivers of Health   Financial Resource Strain: Not on file  Food Insecurity: No Food Insecurity (10/11/2023)   Hunger Vital Sign    Worried About Running Out of Food in the Last Year: Never true    Ran Out of Food in the Last Year: Never true  Transportation Needs: No Transportation Needs (10/11/2023)   PRAPARE - Administrator, Civil Service (Medical): No    Lack of Transportation (Non-Medical): No  Physical Activity: Not on file  Stress: Not on file  Social Connections: Not on file  Intimate Partner Violence: Not At Risk (10/11/2023)   Humiliation, Afraid, Rape, and Kick questionnaire    Fear of Current or Ex-Partner: No    Emotionally Abused: No    Physically Abused: No    Sexually Abused: No    ALLERGIES: Dilaudid [hydromorphone hcl] and Hydromorphone  MEDICATIONS:  Current Outpatient Medications  Medication Sig Dispense Refill   albuterol  (VENTOLIN  HFA) 108 (90 Base) MCG/ACT inhaler Inhale 2 puffs into the lungs every 6 (six) hours as needed for wheezing or shortness of breath.     allopurinol (ZYLOPRIM) 100 MG tablet Take 100 mg by mouth daily.     amLODipine  (NORVASC ) 5 MG tablet Take 1 tablet by mouth daily.     aspirin  81 MG chewable tablet Chew by mouth daily.     Cholecalciferol (VITAMIN D3) 50 MCG (2000 UT) TABS Take by mouth.     clotrimazole -betamethasone  (LOTRISONE ) cream apply TOPICALLY TWICE DAILY 42 g 1   cyanocobalamin (VITAMIN B12) 1000 MCG tablet Take 1,000 mcg by mouth daily.     doxycycline  (VIBRAMYCIN ) 100  MG capsule Take 1 capsule (100 mg total) by mouth 2 (two) times daily. One po bid x 7 days 14 capsule 0   ELDERBERRY PO Take by mouth daily.     glipiZIDE (GLUCOTROL XL) 2.5 MG 24 hr tablet Take 2.5 mg by mouth daily.     Gluc-Chonn-MSM-Boswellia-Vit D (GLUCOSAMINE CHOND TRIPLE/VIT D PO) Take 2 tablets by mouth daily.     indomethacin (INDOCIN) 25 MG capsule Take 25 mg by mouth 3 (three) times daily.     meclizine (ANTIVERT) 25 MG tablet Take 25 mg by mouth 3 (three) times daily as needed for dizziness. Take 1/2 to 1 tablet 3 times a day as needed for  Dizziness     Melatonin 5 MG CAPS Take by mouth.     metoprolol succinate (TOPROL-XL) 25 MG 24 hr tablet Take 25 mg by mouth 2 (two) times daily.     Misc Natural Products (TURMERIC, CURCUMIN, PO) Take 250 mg by mouth daily.     Omega-3 Fatty Acids (OMEGA-3 FISH OIL) 300 MG CAPS Take by mouth.     ONETOUCH ULTRA TEST test strip 1 each daily.     oxyCODONE -acetaminophen  (PERCOCET/ROXICET) 5-325 MG tablet Take 1 tablet by mouth every 6 (six) hours as needed for severe pain (pain score 7-10). 15 tablet 0   rosuvastatin  (CRESTOR ) 5 MG tablet Take 5 mg by mouth daily.     tamsulosin  (FLOMAX ) 0.4 MG CAPS capsule Take 0.4 mg by mouth daily. (Patient not taking: Reported on 12/08/2023)     No current facility-administered medications for this encounter.    REVIEW OF SYSTEMS:  On review of systems, the patient reports that he is doing well overall. He denies any chest pain, shortness of breath, cough, fevers, chills, night sweats, unintended weight changes. He denies any bowel disturbances, and denies abdominal pain, nausea or vomiting. He denies any new musculoskeletal or joint aches or pains. His IPSS was Total Score: 14, indicating moderate urinary symptoms (Reference 0-7 mild, 8-19 moderate, 20-35 severe).  His SHIM: 10, indicating he moderate erectile dysfunction (Reference - 22-25 None, 17-21 Mild, 8-16 Moderate, 1-7 Severe). A complete review of systems  is obtained and is otherwise negative.     PHYSICAL EXAM:  Wt Readings from Last 3 Encounters:  12/08/23 111.1 kg  10/30/23 110 kg  10/11/23 110.4 kg   Temp Readings from Last 3 Encounters:  10/30/23 97.8 F (36.6 C) (Oral)  10/11/23 (!) 97.5 F (36.4 C)  01/25/23 98.3 F (36.8 C)   BP Readings from Last 3 Encounters:  10/30/23 (!) 146/70  10/11/23 (!) 171/85  08/30/23 (!) 165/70   Pulse Readings from Last 3 Encounters:  10/30/23 (!) 57  10/11/23 68  08/30/23 70    /10  In general this is a well appearing Caucasian male in no acute distress. He's alert and oriented x4 and appropriate throughout the examination. Cardiopulmonary assessment is negative for acute distress, and he exhibits normal effort.     KPS = 100  100 - Normal; no complaints; no evidence of disease. 90   - Able to carry on normal activity; minor signs or symptoms of disease. 80   - Normal activity with effort; some signs or symptoms of disease. 47   - Cares for self; unable to carry on normal activity or to do active work. 60   - Requires occasional assistance, but is able to care for most of his personal needs. 50   - Requires considerable assistance and frequent medical care. 40   - Disabled; requires special care and assistance. 30   - Severely disabled; hospital admission is indicated although death not imminent. 20   - Very sick; hospital admission necessary; active supportive treatment necessary. 10   - Moribund; fatal processes progressing rapidly. 0     - Dead  Karnofsky DA, Abelmann WH, Craver LS and Burchenal [facility] [phone]) The use of the nitrogen mustards in the palliative treatment of carcinoma: with particular reference to bronchogenic carcinoma Cancer 1 634-56  LABORATORY DATA:  Lab Results  Component Value Date   WBC 9.7 10/30/2023   HGB 13.4 10/30/2023   HCT 40.2 10/30/2023   MCV 93.1 10/30/2023  PLT 239 10/30/2023   Lab Results  Component Value Date   NA 137 10/30/2023   K 3.8  10/30/2023   CL 104 10/30/2023   CO2 23 10/30/2023   Lab Results  Component Value Date   ALT 33 10/30/2023   AST 45 (H) 10/30/2023   ALKPHOS 73 10/30/2023   BILITOT 1.1 10/30/2023     RADIOGRAPHY: No results found.    IMPRESSION/PLAN: 1. 78 y.o. gentleman with Stage T1c adenocarcinoma of the prostate with Gleason Score of 3+4, and PSA of 6.6. The patient has elected to proceed with seed implant for treatment of his disease. We reviewed the risks, benefits, short and long-term effects associated with brachytherapy and discussed the role of SpaceOAR in reducing the rectal toxicity associated with radiotherapy.  He appears to have a good understanding of his disease and our treatment recommendations which are of curative intent.  He was encouraged to ask questions that were answered to his stated satisfaction. He has freely signed written consent to proceed today in the office and a copy of this document will be placed in his medical record. His procedure is tentatively scheduled for 12/29/2023 in collaboration with Dr. Sherrilee and we will see him back for his post-procedure visit approximately 3 weeks thereafter. We look forward to continuing to participate in his care. He knows that he is welcome to call with any questions or concerns at any time in the interim.  I personally spent 30 minutes in this encounter including chart review, reviewing radiological studies, meeting face-to-face with the patient, entering orders and completing documentation.   Sabra MICAEL Rusk, MMS, PA-C [place]  [facility] Radiation Oncology Physician Assistant Direct Dial: [phone]  Fax: [phone]

## 2023-12-15 DIAGNOSIS — L259 Unspecified contact dermatitis, unspecified cause: Secondary | ICD-10-CM | POA: Diagnosis not present

## 2023-12-15 DIAGNOSIS — R21 Rash and other nonspecific skin eruption: Secondary | ICD-10-CM | POA: Diagnosis not present

## 2023-12-15 DIAGNOSIS — I10 Essential (primary) hypertension: Secondary | ICD-10-CM | POA: Diagnosis not present

## 2023-12-15 DIAGNOSIS — Z299 Encounter for prophylactic measures, unspecified: Secondary | ICD-10-CM | POA: Diagnosis not present

## 2023-12-22 NOTE — Patient Instructions (Signed)
 SURGICAL WAITING ROOM VISITATION Patients having surgery or a procedure may have no more than 2 support people in the waiting area - these visitors may rotate in the visitor waiting room.   Due to an increase in RSV and influenza rates and associated hospitalizations, children ages 50 and under may not visit patients in [facility] hospitals. If the patient needs to stay at the hospital during part of their recovery, the visitor guidelines for inpatient rooms apply.  PRE-OP VISITATION  Pre-op nurse will coordinate an appropriate time for 1 support person to accompany the patient in pre-op.  This support person may not rotate.  This visitor will be contacted when the time is appropriate for the visitor to come back in the pre-op area.  Please refer to the [facility] website for the visitor guidelines for Inpatients (after your surgery is over and you are in a regular room).  You are not required to quarantine at this time prior to your surgery. However, you must do this: Hand Hygiene often Do NOT share personal items Notify your provider if you are in close contact with someone who has COVID or you develop fever 100.4 or greater, new onset of sneezing, cough, sore throat, shortness of breath or body aches.  If you test positive for Covid or have been in contact with anyone that has tested positive in the last 10 days please notify you surgeon.    Your procedure is scheduled on:  12/29/23  Report to [facility] Main Entrance: [place] entrance where the [employer] is available.   Report to admitting at: 11:00 AM  Call this number if you have any questions or problems the morning of surgery [phone]  FOLLOW ANY ADDITIONAL PRE OP INSTRUCTIONS YOU RECEIVED FROM YOUR SURGEON'S OFFICE!!!  Do not eat food after Midnight the night prior to your surgery/procedure.  After Midnight you may have the following liquids until:10:00 AM DAY OF SURGERY  Clear Liquid Diet Water  Black  Coffee (sugar ok, NO MILK/CREAM OR CREAMERS)  Tea (sugar ok, NO MILK/CREAM OR CREAMERS) regular and decaf                             Plain Jell-O  with no fruit (NO RED)                                           Fruit ices (not with fruit pulp, NO RED)                                     Popsicles (NO RED)                                                                  Juice: NO CITRUS JUICES: only apple, WHITE grape, WHITE cranberry Sports drinks like Gatorade or Powerade (NO RED)  Oral Hygiene is also important to reduce your risk of infection.        Remember - BRUSH YOUR TEETH THE MORNING OF SURGERY WITH YOUR REGULAR TOOTHPASTE  Do  NOT smoke after Midnight the night before surgery.  STOP TAKING all Vitamins, Herbs and supplements 1 week before your surgery.   Take ONLY these medicines the morning of surgery with A SIP OF WATER : indomethacin,metoprolol,prednisone.Meclizine as needed.  If You have been diagnosed with Sleep Apnea - Bring CPAP mask and tubing day of surgery. We will provide you with a CPAP machine on the day of your surgery.                   You may not have any metal on your body including hair pins, jewelry, and body piercing  Do not wear lotions, powders, perfumes / cologne, or deodorant   Men may shave face and neck.  Contacts, Hearing Aids, dentures or bridgework may not be worn into surgery. DENTURES WILL BE REMOVED PRIOR TO SURGERY PLEASE DO NOT APPLY Poly grip OR ADHESIVES!!!  You may bring a small overnight bag with you on the day of surgery, only pack items that are not valuable. [facility] IS NOT RESPONSIBLE   FOR VALUABLES THAT ARE LOST OR STOLEN.   Patients discharged on the day of surgery will not be allowed to drive home.  Someone NEEDS to stay with you for the first 24 hours after anesthesia.  Do not bring your home medications to the hospital. The Pharmacy will dispense medications listed on your medication list to you during your admission in the  Hospital.  Special Instructions: Bring a copy of your healthcare power of attorney and living will documents the day of surgery, if you wish to have them scanned into your [place] [facility] Records- EPIC  Please read over the following fact sheets you were given: IF YOU HAVE QUESTIONS ABOUT YOUR PRE-OP INSTRUCTIONS, PLEASE CALL [phone]  PATIENT SIGNATURE_________________________________  NURSE SIGNATURE__________________________________  ________________________________________________________________________  [facility] - Preparing for Surgery Before surgery, you can play an important role.  Because skin is not sterile, your skin needs to be as free of germs as possible.  You can reduce the number of germs on your skin by washing with CHG (chlorahexidine gluconate) soap before surgery.  CHG is an antiseptic cleaner which kills germs and bonds with the skin to continue killing germs even after washing. Please DO NOT use if you have an allergy to CHG or antibacterial soaps.  If your skin becomes reddened/irritated stop using the CHG and inform your nurse when you arrive at Short Stay. Do not shave (including legs and underarms) for at least 48 hours prior to the first CHG shower.  You may shave your face/neck.  Please follow these instructions carefully:  1.  Shower with CHG Soap the night before surgery ONLY (DO NOT USE THE SOAP THE MORNING OF SURGERY).  2.  If you choose to wash your hair, wash your hair first as usual with your normal  shampoo.  3.  After you shampoo, rinse your hair and body thoroughly to remove the shampoo.                             4.  Use CHG as you would any other liquid soap.  You can apply chg directly to the skin and wash.  Gently with a scrungie or clean washcloth.  5.  Apply the CHG Soap to your body ONLY FROM THE NECK DOWN.   Do   not use on face/ open  Wound or open sores. Avoid contact with eyes, ears mouth and   genitals  (private parts).                       Wash face,  Genitals (private parts) with your normal soap.             6.  Wash thoroughly, paying special attention to the area where your    surgery  will be performed.  7.  Thoroughly rinse your body with warm water  from the neck down.  8.  DO NOT shower/wash with your normal soap after using and rinsing off the CHG Soap.                9.  Pat yourself dry with a clean towel.            10.  Wear clean pajamas.            11.  Place clean sheets on your bed the night of your first shower and do not  sleep with pets. Day of Surgery : Do not apply any CHG, lotions/deodorants the morning of surgery.  Please wear clean clothes to the hospital/surgery center.  FAILURE TO FOLLOW THESE INSTRUCTIONS MAY RESULT IN THE CANCELLATION OF YOUR SURGERY  PATIENT SIGNATURE_________________________________  NURSE SIGNATURE__________________________________  ________________________________________________________________________

## 2023-12-23 ENCOUNTER — Encounter (HOSPITAL_COMMUNITY): Payer: Self-pay

## 2023-12-23 ENCOUNTER — Other Ambulatory Visit: Payer: Self-pay

## 2023-12-23 ENCOUNTER — Encounter (HOSPITAL_COMMUNITY)
Admission: RE | Admit: 2023-12-23 | Discharge: 2023-12-23 | Disposition: A | Discharge status: Home / Self Care | Source: Ambulatory Visit | Attending: Urology | Admitting: Urology

## 2023-12-23 VITALS — BP 177/78 | HR 59 | Temp 98.2°F | Ht 71.0 in | Wt 240.0 lb

## 2023-12-23 DIAGNOSIS — Z01818 Encounter for other preprocedural examination: Secondary | ICD-10-CM | POA: Diagnosis not present

## 2023-12-23 DIAGNOSIS — I10 Essential (primary) hypertension: Secondary | ICD-10-CM | POA: Diagnosis not present

## 2023-12-23 DIAGNOSIS — E119 Type 2 diabetes mellitus without complications: Secondary | ICD-10-CM | POA: Insufficient documentation

## 2023-12-23 HISTORY — DX: Type 2 diabetes mellitus without complications: E11.9

## 2023-12-23 LAB — HEMOGLOBIN A1C
Hgb A1c MFr Bld: 6.4 % — ABNORMAL HIGH (ref 4.8–5.6)
Mean Plasma Glucose: 136.98 mg/dL

## 2023-12-23 LAB — CBC
HCT: 43.1 % (ref 39.0–52.0)
Hemoglobin: 13.8 g/dL (ref 13.0–17.0)
MCH: 29.7 pg (ref 26.0–34.0)
MCHC: 32 g/dL (ref 30.0–36.0)
MCV: 92.9 fL (ref 80.0–100.0)
Platelets: 314 K/uL (ref 150–400)
RBC: 4.64 MIL/uL (ref 4.22–5.81)
RDW: 13.6 % (ref 11.5–15.5)
WBC: 17.1 K/uL — ABNORMAL HIGH (ref 4.0–10.5)
nRBC: 0 % (ref 0.0–0.2)

## 2023-12-23 LAB — BASIC METABOLIC PANEL WITH GFR
Anion gap: 13 (ref 5–15)
BUN: 40 mg/dL — ABNORMAL HIGH (ref 8–23)
CO2: 20 mmol/L — ABNORMAL LOW (ref 22–32)
Calcium: 9.4 mg/dL (ref 8.9–10.3)
Chloride: 103 mmol/L (ref 98–111)
Creatinine, Ser: 1.46 mg/dL — ABNORMAL HIGH (ref 0.61–1.24)
GFR, Estimated: 48 mL/min — ABNORMAL LOW (ref >60)
Glucose, Bld: 104 mg/dL — ABNORMAL HIGH (ref 70–99)
Potassium: 4.4 mmol/L (ref 3.5–5.1)
Sodium: 136 mmol/L (ref 135–145)

## 2023-12-23 LAB — GLUCOSE, CAPILLARY: Glucose-Capillary: 125 mg/dL — ABNORMAL HIGH (ref 70–99)

## 2023-12-23 NOTE — Progress Notes (Signed)
 Lab. Results: WBC: 17.1

## 2023-12-23 NOTE — Progress Notes (Signed)
 For Anesthesia: PCP - [name], MD  Cardiologist - N/A  Bowel Prep reminder:N/A  Chest x-- 10/30/23 EKG - 12/23/23 Stress Test -  ECHO -  Cardiac Cath -  Pacemaker/ICD device last checked: Pacemaker orders received: Device Rep notified:  Spinal Cord Stimulator:N/A  Sleep Study - N/A CPAP -   Fasting Blood Sugar - 140's Checks Blood Sugar __1___ times a day Date and result of last Hgb A1c-  Last dose of GLP1 agonist- N/A GLP1 instructions: Hold 7 days prior to schedule (Hold 24 hours-daily)   Last dose of SGLT-2 inhibitors- N/A SGLT-2 instructions: Hold 72 hours prior to surgery  Blood Thinner Instructions:N/A Last Dose: Time last taken:  Aspirin  Instructions: ON HOLD Last Dose: Time last taken:  Activity level: Can go up a flight of stairs and activities of daily living without stopping and without chest pain and/or shortness of breath   Able to exercise without chest pain and/or shortness of breath  Anesthesia review: Hx: HTN,DIA,Bradycardia.  Patient denies shortness of breath, fever, cough and chest pain at PAT appointment   Patient verbalized understanding of instructions that were reviewed over the telephone.

## 2023-12-28 ENCOUNTER — Telehealth: Payer: Self-pay | Admitting: *Deleted

## 2023-12-28 NOTE — Telephone Encounter (Signed)
 CALLED PATIENT TO REMIND OF PROCEDURE FOR 12-29-23, SPOKE WITH PATIENT AND HE IS AWARE OF THIS PROCEDURE

## 2023-12-29 ENCOUNTER — Encounter (HOSPITAL_COMMUNITY): Payer: Self-pay | Admitting: Urology

## 2023-12-29 ENCOUNTER — Ambulatory Visit (HOSPITAL_COMMUNITY): Admitting: Anesthesiology

## 2023-12-29 ENCOUNTER — Ambulatory Visit (HOSPITAL_COMMUNITY)

## 2023-12-29 ENCOUNTER — Encounter (HOSPITAL_COMMUNITY)
Admission: RE | Disposition: A | Discharge status: Home / Self Care | Payer: Self-pay | Source: Ambulatory Visit | Attending: Urology

## 2023-12-29 ENCOUNTER — Ambulatory Visit (HOSPITAL_COMMUNITY)
Admission: RE | Admit: 2023-12-29 | Discharge: 2023-12-29 | Disposition: A | Discharge status: Home / Self Care | Source: Ambulatory Visit | Attending: Urology | Admitting: Urology

## 2023-12-29 DIAGNOSIS — E119 Type 2 diabetes mellitus without complications: Secondary | ICD-10-CM

## 2023-12-29 DIAGNOSIS — Z87891 Personal history of nicotine dependence: Secondary | ICD-10-CM | POA: Diagnosis not present

## 2023-12-29 DIAGNOSIS — I10 Essential (primary) hypertension: Secondary | ICD-10-CM

## 2023-12-29 DIAGNOSIS — C61 Malignant neoplasm of prostate: Secondary | ICD-10-CM | POA: Diagnosis not present

## 2023-12-29 DIAGNOSIS — Z191 Hormone sensitive malignancy status: Secondary | ICD-10-CM | POA: Diagnosis not present

## 2023-12-29 HISTORY — PX: SPACE OAR INSTILLATION: SHX6769

## 2023-12-29 HISTORY — PX: RADIOACTIVE SEED IMPLANT: SHX5150

## 2023-12-29 LAB — GLUCOSE, CAPILLARY
Glucose-Capillary: 115 mg/dL — ABNORMAL HIGH (ref 70–99)
Glucose-Capillary: 94 mg/dL (ref 70–99)

## 2023-12-29 SURGERY — INSERTION, RADIATION SOURCE, PROSTATE
Anesthesia: General

## 2023-12-29 MED ORDER — SUCCINYLCHOLINE CHLORIDE 200 MG/10ML IV SOSY
PREFILLED_SYRINGE | INTRAVENOUS | Status: AC
  Filled 2023-12-29: qty 10

## 2023-12-29 MED ORDER — ROCURONIUM BROMIDE 100 MG/10ML IV SOLN
INTRAVENOUS | Status: DC | PRN
  Administered 2023-12-29: 10 mg via INTRAVENOUS
  Administered 2023-12-29: 40 mg via INTRAVENOUS

## 2023-12-29 MED ORDER — CHLORHEXIDINE GLUCONATE CLOTH 2 % EX PADS: 6.0000 | MEDICATED_PAD | Freq: Every day | CUTANEOUS | Status: DC

## 2023-12-29 MED ORDER — OXYCODONE-ACETAMINOPHEN 5-325 MG PO TABS: 1.0000 | ORAL_TABLET | Freq: Four times a day (QID) | ORAL | 0 refills | Status: DC | PRN

## 2023-12-29 MED ORDER — CHLORHEXIDINE GLUCONATE 0.12 % MT SOLN
15.0000 mL | Freq: Once | OROMUCOSAL | Status: AC
  Administered 2023-12-29: 15 mL via OROMUCOSAL

## 2023-12-29 MED ORDER — PROPOFOL 10 MG/ML IV BOLUS
INTRAVENOUS | Status: AC
  Filled 2023-12-29: qty 20

## 2023-12-29 MED ORDER — SUCCINYLCHOLINE CHLORIDE 200 MG/10ML IV SOSY
PREFILLED_SYRINGE | INTRAVENOUS | Status: DC | PRN
Start: 2023-12-29 — End: 2023-12-29
  Administered 2023-12-29: 120 mg via INTRAVENOUS

## 2023-12-29 MED ORDER — FENTANYL CITRATE (PF) 50 MCG/ML IJ SOSY: 25.0000 ug | PREFILLED_SYRINGE | INTRAMUSCULAR | Status: DC | PRN

## 2023-12-29 MED ORDER — INSULIN ASPART 100 UNIT/ML IJ SOLN: 0.0000 [IU] | INTRAMUSCULAR | Status: DC | PRN

## 2023-12-29 MED ORDER — FENTANYL CITRATE (PF) 100 MCG/2ML IJ SOLN
INTRAMUSCULAR | Status: DC | PRN
  Administered 2023-12-29: 100 ug via INTRAVENOUS

## 2023-12-29 MED ORDER — SODIUM CHLORIDE 0.9 % IR SOLN
Status: DC | PRN
  Administered 2023-12-29: 1000 mL

## 2023-12-29 MED ORDER — ORAL CARE MOUTH RINSE: 15.0000 mL | Freq: Once | OROMUCOSAL | Status: AC

## 2023-12-29 MED ORDER — FENTANYL CITRATE (PF) 100 MCG/2ML IJ SOLN
INTRAMUSCULAR | Status: AC
  Filled 2023-12-29: qty 2

## 2023-12-29 MED ORDER — DEXAMETHASONE SODIUM PHOSPHATE 4 MG/ML IJ SOLN
INTRAMUSCULAR | Status: DC | PRN
  Administered 2023-12-29: 5 mg via INTRAVENOUS

## 2023-12-29 MED ORDER — DROPERIDOL 2.5 MG/ML IJ SOLN: 0.6250 mg | Freq: Once | INTRAMUSCULAR | Status: DC | PRN

## 2023-12-29 MED ORDER — PHENYLEPHRINE HCL (PRESSORS) 10 MG/ML IV SOLN
INTRAVENOUS | Status: DC | PRN
  Administered 2023-12-29 (×3): 80 ug via INTRAVENOUS

## 2023-12-29 MED ORDER — CEFAZOLIN SODIUM-DEXTROSE 2-4 GM/100ML-% IV SOLN
2.0000 g | INTRAVENOUS | Status: AC
  Administered 2023-12-29: 2 g via INTRAVENOUS
  Filled 2023-12-29: qty 100

## 2023-12-29 MED ORDER — SODIUM CHLORIDE (PF) 0.9 % IJ SOLN
INTRAMUSCULAR | Status: AC
  Filled 2023-12-29: qty 10

## 2023-12-29 MED ORDER — SUGAMMADEX SODIUM 200 MG/2ML IV SOLN
INTRAVENOUS | Status: DC | PRN
  Administered 2023-12-29: 200 mg via INTRAVENOUS

## 2023-12-29 MED ORDER — IOHEXOL 300 MG/ML  SOLN
INTRAMUSCULAR | Status: DC | PRN
Start: 2023-12-29 — End: 2023-12-29
  Administered 2023-12-29: 5 mL

## 2023-12-29 MED ORDER — LIDOCAINE HCL (CARDIAC) PF 100 MG/5ML IV SOSY
PREFILLED_SYRINGE | INTRAVENOUS | Status: DC | PRN
  Administered 2023-12-29: 100 mg via INTRAVENOUS

## 2023-12-29 MED ORDER — PROPOFOL 10 MG/ML IV BOLUS
INTRAVENOUS | Status: DC | PRN
  Administered 2023-12-29: 150 mg via INTRAVENOUS

## 2023-12-29 MED ORDER — LACTATED RINGERS IV SOLN: INTRAVENOUS | Status: DC

## 2023-12-29 MED ORDER — ONDANSETRON HCL 4 MG/2ML IJ SOLN
INTRAMUSCULAR | Status: DC | PRN
  Administered 2023-12-29: 4 mg via INTRAVENOUS

## 2023-12-29 MED ORDER — SODIUM CHLORIDE (PF) 0.9 % IJ SOLN
INTRAMUSCULAR | Status: DC | PRN
Start: 2023-12-29 — End: 2023-12-29
  Administered 2023-12-29: 10 mL

## 2023-12-29 MED ORDER — ONDANSETRON HCL 4 MG/2ML IJ SOLN
INTRAMUSCULAR | Status: AC
  Filled 2023-12-29: qty 2

## 2023-12-29 MED ORDER — ACETAMINOPHEN 10 MG/ML IV SOLN: 1000.0000 mg | Freq: Once | INTRAVENOUS | Status: DC | PRN

## 2023-12-29 MED ORDER — LIDOCAINE HCL (PF) 2 % IJ SOLN
INTRAMUSCULAR | Status: AC
  Filled 2023-12-29: qty 5

## 2023-12-29 SURGICAL SUPPLY — 41 items
BAG URINE DRAIN 2000ML AR STRL (UROLOGICAL SUPPLIES) ×2 IMPLANT
BARD QUICKLINK CARTRIDGES WITH BRACHYSOURCE I-125 IMPLANT
BLADE CLIPPER SENSICLIP SURGIC (BLADE) ×2 IMPLANT
CATH FOLEY 2WAY SLVR 5CC 16FR (CATHETERS) ×4 IMPLANT
CATH ROBINSON RED A/P 20FR (CATHETERS) ×2 IMPLANT
CLOTH BEACON ORANGE TIMEOUT ST (SAFETY) ×2 IMPLANT
COVER BACK TABLE 60X90IN (DRAPES) ×2 IMPLANT
COVER MAYO STAND STRL (DRAPES) ×2 IMPLANT
DRSG TEGADERM 4X4.75 (GAUZE/BANDAGES/DRESSINGS) ×4 IMPLANT
DRSG TEGADERM 8X12 (GAUZE/BANDAGES/DRESSINGS) ×4 IMPLANT
GLOVE BIO SURGEON STRL SZ 6.5 (GLOVE) IMPLANT
GLOVE BIO SURGEON STRL SZ7.5 (GLOVE) ×2 IMPLANT
GLOVE BIO SURGEON STRL SZ8 (GLOVE) ×2 IMPLANT
GLOVE BIOGEL PI IND STRL 6.5 (GLOVE) IMPLANT
GLOVE ECLIPSE 8.0 STRL XLNG CF (GLOVE) ×2 IMPLANT
GLOVE SURG ORTHO 8.5 STRL (GLOVE) IMPLANT
GLOVE SURG SS PI 7.5 STRL IVOR (GLOVE) IMPLANT
GOWN STRL REUS W/TWL XL LVL3 (GOWN DISPOSABLE) ×2 IMPLANT
GRID BRACH TEMP 18GA 2.8X3X.75 (MISCELLANEOUS) ×2 IMPLANT
HOLDER FOLEY CATH W/STRAP (MISCELLANEOUS) ×2 IMPLANT
IMPL SPACEOAR SYSTEM 10ML (Spacer) ×2 IMPLANT
IV NS 1000ML BAXH (IV SOLUTION) ×2 IMPLANT
KIT TURNOVER CYSTO (KITS) ×2 IMPLANT
MANIFOLD NEPTUNE II (INSTRUMENTS) IMPLANT
NDL BRACHY 18G 5PK (NEEDLE) ×8 IMPLANT
NDL BRACHY 18G SINGLE (NEEDLE) IMPLANT
NDL PK MORGANSTERN STABILIZ (NEEDLE) ×2 IMPLANT
NEEDLE BRACHY 18G 5PK (NEEDLE) ×4 IMPLANT
NEEDLE BRACHY 18G SINGLE (NEEDLE) ×1 IMPLANT
NEEDLE PK MORGANSTERN STABILIZ (NEEDLE) ×1 IMPLANT
PACK CYSTO (CUSTOM PROCEDURE TRAY) ×2 IMPLANT
PENCIL SMOKE EVACUATOR (MISCELLANEOUS) IMPLANT
SHEATH ULTRASOUND LF (SHEATH) IMPLANT
SHEATH ULTRASOUND LTX NONSTRL (SHEATH) IMPLANT
SLEEVE SCD COMPRESS KNEE MED (STOCKING) ×2 IMPLANT
SURGILUBE 2OZ TUBE FLIPTOP (MISCELLANEOUS) ×2 IMPLANT
SYR 10ML LL (SYRINGE) ×2 IMPLANT
TOWEL OR 17X24 6PK STRL BLUE (TOWEL DISPOSABLE) ×2 IMPLANT
UNDERPAD 30X36 HEAVY ABSORB (UNDERPADS AND DIAPERS) ×4 IMPLANT
WATER STERILE IRR 3000ML UROMA (IV SOLUTION) ×2 IMPLANT
WATER STERILE IRR 500ML POUR (IV SOLUTION) ×2 IMPLANT

## 2023-12-29 NOTE — Anesthesia Procedure Notes (Signed)
 Procedure Name: Intubation Date/Time: 12/29/2023 1:47 PM  Performed by: Pam Macario BROCKS, CRNAPre-anesthesia Checklist: Patient identified, Emergency Drugs available, Suction available, Patient being monitored and Timeout performed Patient Re-evaluated:Patient Re-evaluated prior to induction Oxygen Delivery Method: Circle system utilized Preoxygenation: Pre-oxygenation with 100% oxygen Induction Type: IV induction Ventilation: Mask ventilation without difficulty Laryngoscope Size: Mac and 4 Grade View: Grade II Tube type: Oral Tube size: 7.5 mm Number of attempts: 1 Airway Equipment and Method: Stylet and Oral airway Placement Confirmation: ETT inserted through vocal cords under direct vision, positive ETCO2, breath sounds checked- equal and bilateral and CO2 detector Secured at: 23 cm Tube secured with: Tape Dental Injury: Teeth and Oropharynx as per pre-operative assessment

## 2023-12-29 NOTE — Transfer of Care (Signed)
 Immediate Anesthesia Transfer of Care Note  Patient: Ana Liaw Cashin  Procedure(s) Performed: Procedure(s): INSERTION, RADIATION SOURCE, PROSTATE (N/A) INJECTION, HYDROGEL SPACER (N/A)  Patient Location: PACU  Anesthesia Type:General  Level of Consciousness:  sedated, patient cooperative and responds to stimulation  Airway & Oxygen Therapy:Patient Spontanous Breathing and Patient connected to face mask oxgen  Post-op Assessment:  Report given to PACU RN and Post -op Vital signs reviewed and stable  Post vital signs:  Reviewed and stable  Last Vitals:  Vitals:   12/29/23 1117 12/29/23 1503  BP: (!) 176/86 (!) 187/79  Pulse: 87 78  Resp:  16  Temp: 36.8 C 36.8 C  SpO2: 96% 100%    Complications: No apparent anesthesia complications

## 2023-12-29 NOTE — Anesthesia Preprocedure Evaluation (Signed)
 Anesthesia Evaluation  Patient identified by MRN, date of birth, ID band Patient awake    Reviewed: Allergy & Precautions, NPO status , Patient's Chart, lab work & pertinent test results  History of Anesthesia Complications Negative for: history of anesthetic complications  Airway Mallampati: III  TM Distance: >3 FB Neck ROM: Full    Dental  (+) Edentulous Upper, Edentulous Lower, Dental Advisory Given   Pulmonary neg sleep apnea, neg COPD, Patient abstained from smoking.Not current smoker, former smoker    + decreased breath sounds      Cardiovascular Exercise Tolerance: Good METShypertension, Pt. on medications (-) angina (-) CAD, (-) Past MI and (-) CHF (-) dysrhythmias  Rhythm:Regular Rate:Normal - Systolic murmurs    Neuro/Psych negative neurological ROS  negative psych ROS   GI/Hepatic ,neg GERD  ,,(+)     (-) substance abuse    Endo/Other  diabetes, Oral Hypoglycemic Agents  Lab Results      Component                Value               Date                      HGBA1C                                       08/27/2006            6.1 (NOTE)   The ADA recommends the following therapeutic goals for glycemic   control related to Hgb A1C measurement:   Goal of Therapy:   < 7.0% Hgb A1C   Action Suggested:  > 8.0% Hgb A1C   Ref:  Diabetes Care, 22, Suppl. 1, 1999   Renal/GU Renal diseaseLab Results      Component                Value               Date                      NA                       135                 12/28/2022                K                        4.1                 12/28/2022                CO2                      16 (L)              12/28/2022                GLUCOSE                  116 (H)             12/28/2022                BUN  37 (H)              12/28/2022                CREATININE               2.62 (H)            12/28/2022                CALCIUM                   8.7  (L)             12/28/2022                EGFR                     58 (L)              06/29/2022                GFRNONAA                 24 (L)              12/28/2022             urospesis     Musculoskeletal  (+) Arthritis ,    Abdominal  (+) + obese  Peds  Hematology Lab Results      Component                Value               Date                      WBC                      22.9 (H)            12/28/2022                HGB                      12.8 (L)            12/28/2022                HCT                      39.5                12/28/2022                MCV                      93.8                12/28/2022                PLT                      244                 12/28/2022              Anesthesia Other Findings Past Medical History: No date: Arthritis No date: Bradycardia     Comment:  asymptomatic  No date: Diabetes mellitus without complication (HCC) No date: History of kidney stones No  date: Hypertension No date: Prostate cancer [facility])  Reproductive/Obstetrics                              Anesthesia Physical Anesthesia Plan  ASA: 3  Anesthesia Plan: General   Post-op Pain Management: Minimal or no pain anticipated and Ofirmev  IV (intra-op)*   Induction: Intravenous  PONV Risk Score and Plan: 3 and Ondansetron , Dexamethasone  and Treatment may vary due to age or medical condition  Airway Management Planned: Oral ETT  Additional Equipment: None  Intra-op Plan:   Post-operative Plan: Extubation in OR  Informed Consent: I have reviewed the patients History and Physical, chart, labs and discussed the procedure including the risks, benefits and alternatives for the proposed anesthesia with the patient or authorized representative who has indicated his/her understanding and acceptance.     Dental advisory given  Plan Discussed with: CRNA  Anesthesia Plan Comments: (Discussed risks of anesthesia with patient, including  PONV, sore throat, lip/dental/eye damage, post operative cognitive dysfunction. Rare risks discussed as well, such as cardiorespiratory and neurological sequelae, and allergic reactions. Discussed the role of CRNA in patient's perioperative care. Patient understands.)         Anesthesia Quick Evaluation

## 2023-12-29 NOTE — Op Note (Signed)
 PRE-OPERATIVE DIAGNOSIS:  Adenocarcinoma of the prostate  POST-OPERATIVE DIAGNOSIS:  Same  PROCEDURE:  Procedure(s): 1. I-125 radioactive seed implantation 2. Cystoscopy 3. SpaceOAR placement  SURGEON:  Surgeon(s): [name], MD  Radiation oncologist: Donnice Barge, MD  ANESTHESIA:  General  EBL:  Minimal  DRAINS: 16 French Foley catheter  INDICATION: Marcus Simpson is a 78 year old with a history of T1c prostate cancer. After discussing treatment options he has elected to proceed with brachytherapy  Description of procedure: After informed consent the patient was brought to the major OR, placed on the table and administered general anesthesia. He was then moved to the modified lithotomy position with his perineum perpendicular to the floor. His perineum and genitalia were then sterilely prepped. An official timeout was then performed. A 16 French Foley catheter was then placed in the bladder and filled with dilute contrast, a rectal tube was placed in the rectum and the transrectal ultrasound probe was placed in the rectum and affixed to the stand. He was then sterilely draped.  Real time ultrasonography was used along with the seed planning software [employer] Pro[employer] vs. 4.2.21. This was used to develop the seed plan including the number of needles as well as number of seeds required for complete and adequate coverage. Real-time ultrasonography was then used along with the previously developed plan and the Nucletron device to implant a total of 55 seeds using 16 needles. This proceeded without difficulty or complication.  We then proceeded to mix the SpaceOAR using the kit supplied from the manufacturer. Once this was complete we placed a sinal needle into the perirectal fat between the rectum and the prostate. Once this was accomplished we injected 2cc of normal saline to hydrodissect the plain. We then instilled the the SpaceOAR through the spinal needle and noted good  distribution in the perirectal fat.    A Foley catheter was then removed as well as the transrectal ultrasound probe and rectal probe. Flexible cystoscopy was then performed using the 17 French flexible scope which revealed a normal urethra throughout its length down to the sphincter which appeared intact. The prostatic urethra revealed bilobar hypertrophy but no evidence of obstruction, seeds, spacers or lesions. The bladder was then entered and fully and systematically inspected. The ureteral orifices were noted to be of normal configuration and position. The mucosa revealed no evidence of tumors. There were also no stones identified within the bladder. I noted no seeds or spacers on the floor of the bladder and retroflexion of the scope revealed no seeds protruding from the base of the prostate.  The cystoscope was then removed and a new 16 French Foley catheter was then inserted and the balloon was filled with 10 cc of sterile water . This was connected to closed system drainage and the patient was awakened and taken to recovery room in stable and satisfactory condition. He tolerated procedure well and there were no intraoperative complications.

## 2023-12-29 NOTE — Progress Notes (Signed)
  Radiation Oncology         [phone] ________________________________  Name: KENSTON LONGTON MRN: [id number]  Date: 12/29/2023  DOB: November 09, 1945       Prostate Seed Implant  RR:Cbjd, Dhruv B, MD  No ref. provider found  DIAGNOSIS:   78 y.o. gentleman with Stage T1c adenocarcinoma of the prostate with Gleason score of 3+4, and PSA of 6.6.   Oncology History  Prostate cancer (HCC)  03/10/2015 Initial Diagnosis   Malignant neoplasm of prostate (HCC)   08/08/2023 Cancer Staging   Staging form: Prostate, AJCC 8th Edition - Clinical stage from 08/08/2023: Stage IIB (cT1c, cN0, cM0, PSA: 6.6, Grade Group: 2) - Signed by Sherwood Rise, PA-C on 10/07/2023 Histopathologic type: Adenocarcinoma, NOS Stage prefix: Initial diagnosis Prostate specific antigen (PSA) range: Less than 10 Gleason primary pattern: 3 Gleason secondary pattern: 4 Gleason score: 7 Histologic grading system: 5 grade system Number of biopsy cores examined: 15 Number of biopsy cores positive: 7 Location of positive needle core biopsies: One side       ICD-10-CM   1. Prostate cancer [facility])  C61 Discharge patient      PROCEDURE: Insertion of radioactive I-125 seeds into the prostate gland.  RADIATION DOSE: 145 Gy, definitive/boost therapy.  TECHNIQUE: Masen Luallen Zweig was brought to the operating room with the urologist. He was placed in the dorsolithotomy position. He was catheterized and a rectal tube was inserted. The perineum was shaved, prepped and draped. The ultrasound probe was then introduced by me into the rectum to see the prostate gland.  TREATMENT DEVICE: I attached the needle grid to the ultrasound probe stand and anchor needles were placed.  3D PLANNING: The prostate was imaged in 3D using a sagittal sweep of the prostate probe. These images were transferred to the planning computer. There, the prostate, urethra and rectum were defined on each axial reconstructed image. Then, the software created  an optimized 3D plan and a few seed positions were adjusted. The quality of the plan was reviewed using [facility] information for the target and the following two organs at risk:  Urethra and Rectum.  Then the accepted plan was printed and handed off to the radiation therapist.  Under my supervision, the custom loading of the seeds and spacers was carried out using the quick loader.  These pre-loaded needles were then placed into the needle holder.SABRA  PROSTATE VOLUME STUDY:  Using transrectal ultrasound the volume of the prostate was verified to be 38.96 cc.  SPECIAL TREATMENT PROCEDURE/SUPERVISION AND HANDLING: The pre-loaded needles were then delivered by the urologist under sagittal guidance. A total of 16 needles were used to deposit 55 seeds in the prostate gland. The individual seed activity was 0.506 mCi.  SpaceOAR:  Yes  COMPLEX SIMULATION: At the end of the procedure, an anterior radiograph of the pelvis was obtained to document seed positioning and count. Cystoscopy was performed by the urologist to check the urethra and bladder.  MICRODOSIMETRY: At the end of the procedure, the patient was emitting 0.06 mR/hr at 1 meter. Accordingly, he was considered safe for hospital discharge.  PLAN: The patient will return to the radiation oncology clinic for post implant CT dosimetry in three weeks.   ________________________________  Donnice FELIX Patrcia, M.D.

## 2023-12-29 NOTE — Anesthesia Postprocedure Evaluation (Signed)
 Anesthesia Post Note  Patient: Marcus Simpson  Procedure(s) Performed: INSERTION, RADIATION SOURCE, PROSTATE INJECTION, HYDROGEL SPACER     Patient location during evaluation: PACU Anesthesia Type: General Level of consciousness: awake and alert Pain management: pain level controlled Vital Signs Assessment: post-procedure vital signs reviewed and stable Respiratory status: spontaneous breathing, nonlabored ventilation and respiratory function stable Cardiovascular status: blood pressure returned to baseline Postop Assessment: no apparent nausea or vomiting Anesthetic complications: no   No notable events documented.  Last Vitals:  Vitals:   12/29/23 1515 12/29/23 1530  BP: (!) 170/85 (!) 174/162  Pulse: 79 74  Resp: 20 (!) 27  Temp:  36.6 C  SpO2: 95% 94%    Last Pain:  Vitals:   12/29/23 1530  TempSrc:   PainSc: 0-No pain                 Vertell Row

## 2023-12-29 NOTE — H&P (Signed)
 History of Present Illness:  : 10.14.2016: TRUS/Bx.  PSA 5.9, prostate volume 41 mL, PSA density 0.14.  4/12 cores positive, all in the right apical and mid prostate, all with GG 1 pathology. Strong family history of prostate cancer.  Active surveillance chosen.   9.25.2018: Prostate MRI-2 cm PI-RADS 5 lesion in left transition zone. 11.16.2018: Fusion biopsy.  Volume 42 mL.  4 cores were taken from the region of interest.  3 of these revealed GG 1 pattern, 1 was benign.  2/12 systematic cores-from the left mid medial and left apical medial also revealed GG 1 pattern.   7.7.2020: PSA 6.1.  He was to have been scheduled for repeat prostate MRI with possible fusion biopsy in 6 months.  Neither of these was performed.   7.5.2022: He apparently had a PSA drawn within the past month with Dr. Rosamond.  He states that it was 7.0.  He has had no bony pain.  He has no significant lower urinary tract symptoms that are overall stable.  He has had recent intermittent gross painless hematuria.  He really does not have any significant history of cigarette smoking.   8.16.2022:  He did have a CT scan on 7/18 revealing:  IMPRESSION: 1. Numerous bilateral nonobstructive renal calculi. 2. Urinary bladder wall thickening may partially be due to nondistention although a component of cystitis cannot be excluded. 3. Bilateral benign-appearing renal cysts. Some of the hypodense right renal lesions of the left kidney are technically too small to characterize. 4. Potential morphologic indicators of early cirrhosis in the liver. 5. Multilevel lumbar impingement. 6. 60 cubic mm left lower lobe subpleural nodule on image 6 series 15. No follow-up needed if patient is low-risk. Non-contrast chest CT can be considered in 12 months if patient is high-risk.  8. 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  Cystoscopy revealed bilobar hyperplasia with obstruction.  Normal  bladder.   5.22.2023: PSA 7.0   4.9.2024: PSA 6.1.  Prostate MRI ordered.   10.9.2024:   Underwent urgent stent placement for a right ureteral stone with obstruction and sepsis    11.5.2024: ESL of ureteral stone. Stent subsequently removed 11.19.2024   3.5.2025: Repeat MRI prostate FINDINGS: Prostate:   Encapsulated nodularity in the transition zone compatible with benign prostatic hypertrophy.   Region of interest # 1: Ill-defined PI-RADS category 5 lesion of the left anterior transition zone extending from the base through the apex, with involvement of the posterior transition zone in the mid gland and apex; involvement of the anterior peripheral zone in the mid gland and apex; and involvement of the left posterolateral peripheral zone in the apex. This lesion has ill-defined low T2 signal (image 12 series 6) with low ADC map activity and restricted diffusion (image 11 of series 8 and 9) as well as early enhancement (image 140 of series 13). This lesion measures 5.76 cc (2.3 by 1.7 by 2.6 cm), representing enlargement compared to previous. No clearly delineated extracapsular spread or seminal vesicle invasion. Volume: 3D volumetric assessment: Prostate volume 35.7 cc (5.2 by 3.4 by 3.9 cm). Transcapsular spread: Absent Seminal vesicle involvement: Absent Neurovascular bundle involvement: Absent Pelvic adenopathy: Absent Bone metastasis: Absent Other findings: No other significant findings.   IMPRESSION: 1. Ill-defined PI-RADS category 5 lesion of the left prostate gland, increased in size compared to the 2018 exam. Targeting data sent to [facility]. 2. No clearly delineated extracapsular spread or seminal vesicle invasion. 3. Mild prostatomegaly and benign prostatic hypertrophy.  5.16.2025: Fusion biopsy   Volume 50 mL 3 cores were taken from the region of interest, 12 systematic cores taken. All 3 cores from region of interest revealed GS 3+4 pattern and 40, 40, and  50% of cores respectively. 4/12 systematic cores (left base medial, left mid medial, left mid lateral, left apex lateral) revealed GS 3+4 pattern and 50, 5, 60 and 30% of cores respectively       Past Medical History:  Diagnosis Date   Arthritis     Bradycardia      asymptomatic    History of kidney stones     Hypertension     Prostate cancer [facility])                 Past Surgical History:  Procedure Laterality Date   COLONOSCOPY       CYSTOSCOPY WITH STENT PLACEMENT Right 12/28/2022    Procedure: CYSTOSCOPY WITH STENT PLACEMENT;  Surgeon: Carolee Sherwood [name], MD;  Location: [facility] OR;  Service: Urology;  Laterality: Right;   EXTRACORPOREAL SHOCK WAVE LITHOTRIPSY Right 01/25/2023    Procedure: EXTRACORPOREAL SHOCK WAVE LITHOTRIPSY (ESWL);  Surgeon: [name], MD;  Location: [facility] ORS;  Service: Urology;  Laterality: Right;   FLEXIBLE SIGMOIDOSCOPY N/A 04/02/2015    Procedure: FLEXIBLE SIGMOIDOSCOPY;  Surgeon: Claudis RAYMOND Rivet, MD;  Location: [facility];  Service: Endoscopy;  Laterality: N/A;  1:45 - moved to 1/11 @ 8:30 - Ann to notify   KIDNEY STONE SURGERY       TOTAL KNEE ARTHROPLASTY Left 05/02/2017    Procedure: LEFT TOTAL KNEE ARTHROPLASTY;  Surgeon: Melodi Lerner, MD;  Location: [facility] ORS;  Service: Orthopedics;  Laterality: Left;          Home Medications:  Allergies as of 08/30/2023         Reactions    Dilaudid [hydromorphone Hcl] Anaphylaxis    Hydromorphone Other (See Comments)    Other reaction(s): Irregular heart rate            Medication List           Accurate as of August 30, 2023  3:43 PM. If you have any questions, ask your nurse or doctor.              albuterol  108 (90 Base) MCG/ACT inhaler Commonly known as: VENTOLIN  HFA Inhale 2 puffs into the lungs every 6 (six) hours as needed for wheezing or shortness of breath.    allopurinol 100 MG tablet Commonly known as: ZYLOPRIM Take 100 mg by mouth daily.    amLODipine  5 MG tablet Commonly known  as: NORVASC  Take 1 tablet by mouth daily.    aspirin  81 MG chewable tablet Chew by mouth daily.    cetirizine 10 MG tablet Commonly known as: ZYRTEC Take 10 mg by mouth at bedtime.    clobetasol cream 0.05 % Commonly known as: TEMOVATE Apply 1 application  topically 2 (two) times daily.    clotrimazole -betamethasone  cream Commonly known as: LOTRISONE  Apply 1 Application topically 2 (two) times daily.    cyclobenzaprine  10 MG tablet Commonly known as: FLEXERIL  Take 1 tablet (10 mg total) by mouth 3 (three) times daily as needed for muscle spasms.    diazepam  10 MG tablet Commonly known as: VALIUM  Take 1 tablet 2 hours before procedure    gabapentin  400 MG capsule Commonly known as: NEURONTIN  Take 400 mg by mouth 2 (two) times daily.    glipiZIDE 2.5 MG 24 hr tablet Commonly known as:  GLUCOTROL XL Take 2.5 mg by mouth daily.    GLUCOSAMINE CHOND TRIPLE/VIT D PO Take 2 tablets by mouth daily.    indomethacin 25 MG capsule Commonly known as: INDOCIN Take 25 mg by mouth 3 (three) times daily.    loperamide  2 MG capsule Commonly known as: IMODIUM  Take 1 capsule (2 mg total) by mouth 2 (two) times daily.    meclizine 25 MG tablet Commonly known as: ANTIVERT Take 25 mg by mouth 3 (three) times daily as needed for dizziness. Take 1/2 to 1 tablet 3 times a day as needed for Dizziness    Melatonin 5 MG Caps Take by mouth.    methocarbamol  500 MG tablet Commonly known as: ROBAXIN  Take 1 tablet (500 mg total) by mouth every 6 (six) hours as needed for muscle spasms.    metoprolol succinate 25 MG 24 hr tablet Commonly known as: TOPROL-XL Take 25 mg by mouth 2 (two) times daily.    multivitamin tablet Take 1 tablet by mouth daily.    oxyCODONE  5 MG immediate release tablet Commonly known as: Oxy IR/ROXICODONE  Take 1-2 tablets (5-10 mg total) by mouth every 4 (four) hours as needed for moderate pain or severe pain.    oxyCODONE -acetaminophen  5-325 MG  tablet Commonly known as: Percocet Take 1 tablet by mouth every 6 (six) hours as needed.    rivaroxaban  10 MG Tabs tablet Commonly known as: XARELTO  Take 1 tablet (10 mg total) by mouth daily with breakfast. Take Xarelto  for two and a half more weeks following discharge from the hospital, then discontinue Xarelto . Once the patient has completed the Xarelto , they may resume the 81 mg Aspirin .    rosuvastatin  5 MG tablet Commonly known as: CRESTOR  Take 5 mg by mouth daily.    tamsulosin  0.4 MG Caps capsule Commonly known as: FLOMAX  Take 1 capsule (0.4 mg total) by mouth daily after supper.    triamcinolone  cream 0.1 % Commonly known as: KENALOG  Apply 1 application topically 2 (two) times daily.    valACYclovir  1000 MG tablet Commonly known as: VALTREX  Take 1 tablet (1,000 mg total) by mouth 3 (three) times daily.             Allergies:  Allergies       Allergies  Allergen Reactions   Dilaudid [Hydromorphone Hcl] Anaphylaxis   Hydromorphone Other (See Comments)      Other reaction(s): Irregular heart rate        No family history on file.       Social History:  reports that he has quit smoking. His smoking use included cigarettes. He has never used smokeless tobacco. He reports that he does not drink alcohol and does not use drugs.   ROS: A complete review of systems was performed.  All systems are negative except for pertinent findings as noted.   Physical Exam:  Vital signs in last 24 hours: BP (!) 165/70   Pulse 70  Constitutional:  Alert and oriented, No acute distress Cardiovascular: Regular rate  Respiratory: Normal respiratory effort Neurologic: Grossly intact, no focal deficits Psychiatric: Normal mood and affect   I have reviewed prior pt notes   I have reviewed urinalysis results--clear   IPSS sheet reviewed-15/2   I have independently reviewed prior imaging--CT scan, MRI scan results.     Hospital records reviewed            Impression/Assessment:  Recent right ureteral stone/UTI, treated, last checked stone free   Grade group 2 prostate cancer,  now higher volume       Plan:  The risks/benefits/alternatives to brachytherapy with spaceoar and cystoscopy was explained to the patient and he understands and wishes to proceed with surgery

## 2023-12-30 ENCOUNTER — Other Ambulatory Visit: Payer: Self-pay | Admitting: Urology

## 2023-12-30 DIAGNOSIS — C61 Malignant neoplasm of prostate: Secondary | ICD-10-CM

## 2024-01-02 ENCOUNTER — Telehealth: Payer: Self-pay

## 2024-01-02 NOTE — Telephone Encounter (Signed)
 Pt's wife called in today and state pt's penis is very sore from cath  and need help now. Pt is aware a message will be sent to MD, McKenzie

## 2024-01-04 ENCOUNTER — Ambulatory Visit: Admitting: Urology

## 2024-01-04 VITALS — BP 169/74 | HR 84

## 2024-01-04 DIAGNOSIS — C61 Malignant neoplasm of prostate: Secondary | ICD-10-CM

## 2024-01-04 MED ORDER — CIPROFLOXACIN HCL 500 MG PO TABS
500.0000 mg | ORAL_TABLET | Freq: Once | ORAL | Status: AC
  Administered 2024-01-04: 500 mg via ORAL

## 2024-01-04 NOTE — Progress Notes (Signed)
 01/04/2024 9:07 AM   Marcus Simpson October 13, 1945 [id number]  Referring provider: [name], MD [address] [place],  [place] [address]  Followup after brachytherapy   HPI: Mr Marcus Simpson is a 78yo here for followup after brachytherapy. Voiding trial passed today. No pain for seed insertion site. NOp issues with bowel movements   PMH: Past Medical History:  Diagnosis Date   Arthritis    Bradycardia    asymptomatic    Diabetes mellitus without complication (HCC)    History of kidney stones    Hypertension    Prostate cancer [facility])     Surgical History: Past Surgical History:  Procedure Laterality Date   COLONOSCOPY     CYSTOSCOPY WITH STENT PLACEMENT Right 12/28/2022   Procedure: CYSTOSCOPY WITH STENT PLACEMENT;  Surgeon: Marcus Sherwood BIRCH DOUGLAS, MD;  Location: [facility] OR;  Service: Urology;  Laterality: Right;   EXTRACORPOREAL SHOCK WAVE LITHOTRIPSY Right 01/25/2023   Procedure: EXTRACORPOREAL SHOCK WAVE LITHOTRIPSY (ESWL);  Surgeon: Marcus Senior, MD;  Location: [facility];  Service: Urology;  Laterality: Right;   FLEXIBLE SIGMOIDOSCOPY N/A 04/02/2015   Procedure: FLEXIBLE SIGMOIDOSCOPY;  Surgeon: Marcus RAYMOND Rivet, MD;  [facility];  Service: Endoscopy;  Laterality: N/A;  1:45 - moved to 1/11 @ 8:30 - Ann to notify   KIDNEY STONE SURGERY     RADIOACTIVE SEED IMPLANT N/A 12/29/2023   Procedure: INSERTION, RADIATION SOURCE, PROSTATE;  Surgeon: Marcus Belvie CROME, MD;  Location: [facility];  Service: Urology;  Laterality: N/A;   SPACE OAR INSTILLATION N/A 12/29/2023   Procedure: INJECTION, HYDROGEL SPACER;  Surgeon: Marcus Belvie CROME, MD;  Location: [facility];  Service: Urology;  Laterality: N/A;   TOTAL KNEE ARTHROPLASTY Left 05/02/2017   Procedure: LEFT TOTAL KNEE ARTHROPLASTY;  Surgeon: Melodi Lerner, MD;  Location: [facility];  Service: Orthopedics;  Laterality: Left;    Home Medications:  Allergies as of 01/04/2024       Reactions   Dilaudid [hydromorphone Hcl] Anaphylaxis    Hydromorphone Other (See Comments)   Other reaction(s): Irregular heart rate        Medication List        Accurate as of January 04, 2024  9:07 AM. If you have any questions, ask your nurse or doctor.          aspirin  81 MG chewable tablet Chew by mouth daily.   clotrimazole -betamethasone  cream Commonly known as: LOTRISONE  apply TOPICALLY TWICE DAILY   cyanocobalamin 1000 MCG tablet Commonly known as: VITAMIN B12 Take 1,000 mcg by mouth daily.   doxycycline  100 MG capsule Commonly known as: VIBRAMYCIN  Take 1 capsule (100 mg total) by mouth 2 (two) times daily. One po bid x 7 days   ELDERBERRY PO Take 1 tablet by mouth daily.   glipiZIDE 2.5 MG 24 hr tablet Commonly known as: GLUCOTROL XL Take 2.5 mg by mouth daily.   indomethacin 25 MG capsule Commonly known as: INDOCIN Take 25 mg by mouth 2 (two) times daily with a meal.   meclizine 25 MG tablet Commonly known as: ANTIVERT Take 25 mg by mouth 3 (three) times daily as needed for dizziness.   metoprolol succinate 25 MG 24 hr tablet Commonly known as: TOPROL-XL Take 25 mg by mouth 2 (two) times daily.   OneTouch Ultra Test test strip Generic drug: glucose blood 1 each daily.   OVER THE COUNTER MEDICATION Take 1 capsule by mouth daily. Super Beets   oxyCODONE -acetaminophen  5-325 MG tablet Commonly known as: PERCOCET/ROXICET Take  1 tablet by mouth every 6 (six) hours as needed for severe pain (pain score 7-10).   predniSONE 10 MG tablet Commonly known as: DELTASONE Take 10 mg by mouth 2 (two) times daily with a meal.   rosuvastatin  5 MG tablet Commonly known as: CRESTOR  Take 5 mg by mouth every Saturday.   Vitamin D3 50 MCG (2000 UT) Tabs Take 2,000 Units by mouth daily.        Allergies:  Allergies  Allergen Reactions   Dilaudid [Hydromorphone Hcl] Anaphylaxis   Hydromorphone Other (See Comments)    Other reaction(s): Irregular heart rate    Family History: No family history on  file.  Social History:  reports that he has quit smoking. His smoking use included cigarettes. He has never used smokeless tobacco. He reports that he does not drink alcohol and does not use drugs.  ROS: All other review of systems were reviewed and are negative except what is noted above in HPI  Physical Exam: BP (!) 169/74   Pulse 84   Constitutional:  Alert and oriented, No acute distress. HEENT: [place] AT, moist mucus membranes.  Trachea midline, no masses. Cardiovascular: No clubbing, cyanosis, or edema. Respiratory: Normal respiratory effort, no increased work of breathing. GI: Abdomen is soft, nontender, nondistended, no abdominal masses GU: No CVA tenderness.  Lymph: No cervical or inguinal lymphadenopathy. Skin: No rashes, bruises or suspicious lesions. Neurologic: Grossly intact, no focal deficits, moving all 4 extremities. Psychiatric: Normal mood and affect.  Laboratory Data: Lab Results  Component Value Date   WBC 17.1 (H) 12/23/2023   HGB 13.8 12/23/2023   HCT 43.1 12/23/2023   MCV 92.9 12/23/2023   PLT 314 12/23/2023    Lab Results  Component Value Date   CREATININE 1.46 (H) 12/23/2023    No results found for: PSA  No results found for: TESTOSTERONE  Lab Results  Component Value Date   HGBA1C 6.4 (H) 12/23/2023    Urinalysis    Component Value Date/Time   COLORURINE YELLOW 10/30/2023 0731   APPEARANCEUR CLEAR 10/30/2023 0731   APPEARANCEUR Clear 05/17/2023 1312   LABSPEC 1.015 10/30/2023 0731   PHURINE 5.0 10/30/2023 0731   GLUCOSEU NEGATIVE 10/30/2023 0731   HGBUR NEGATIVE 10/30/2023 0731   BILIRUBINUR NEGATIVE 10/30/2023 0731   BILIRUBINUR Negative 05/17/2023 1312   KETONESUR NEGATIVE 10/30/2023 0731   PROTEINUR NEGATIVE 10/30/2023 0731   NITRITE NEGATIVE 10/30/2023 0731   LEUKOCYTESUR NEGATIVE 10/30/2023 0731    Lab Results  Component Value Date   LABMICR See below: 05/17/2023   WBCUA 6-10 (A) 05/17/2023   LABEPIT 0-10 05/17/2023    MUCUS Present 11/04/2020   BACTERIA None seen 05/17/2023    Pertinent Imaging:  Results for orders placed during the hospital encounter of 05/25/23  DG Abd 1 View  Narrative CLINICAL DATA:  kidney stone  EXAM: ABDOMEN - 1 VIEW  COMPARISON:  X-ray abdomen 02/08/2023, CT abdomen pelvis 12/28/2022  FINDINGS: The bowel gas pattern is normal. Calcifications overlying the expected region of the renal shadows may represent nephrolithiasis. Calcifications measuring up to approximately 1 cm on the right and 7 mm on the left. Interval removal of a right ureteral stent.  IMPRESSION: Nephrolithiasis bilaterally.   Electronically Signed By: Morgane  Naveau M.D. On: 06/11/2023 23:50  No results found for this or any previous visit.  No results found for this or any previous visit.  No results found for this or any previous visit.  No results found for this or any previous  visit.  No results found for this or any previous visit.  No results found for this or any previous visit.  Results for orders placed during the hospital encounter of 10/30/23  CT Renal Stone Study  Narrative EXAM: CT ABDOMEN AND PELVIS WITHOUT CONTRAST 10/30/2023 08:01:49 AM  TECHNIQUE: CT of the abdomen and pelvis was performed without the administration of intravenous contrast. Multiplanar reformatted images are provided for review. Automated exposure control, iterative reconstruction, and/or weight based adjustment of the mA/kV was utilized to reduce the radiation dose to as low as reasonably achievable.  COMPARISON: CT of the abdomen and pelvis 12/28/2022.  CLINICAL HISTORY: Abdominal/flank pain, stone suspected; with complaints of left side flank pain that began yesterday. Patient states he has decreased urination.  FINDINGS:  LOWER CHEST: Mild dependent atelectasis is present about the lungs.  LIVER: The liver is unremarkable.  GALLBLADDER AND BILE DUCTS: Gallbladder is  unremarkable. No biliary ductal dilatation.  SPLEEN: No acute abnormality.  PANCREAS: No acute abnormality.  ADRENAL GLANDS: No acute abnormality.  KIDNEYS, URETERS AND BLADDER: Multiple nonobstructing stones are present in both kidneys. The largest stone in the right kidney is at the upper pole measuring 9.3 mm, similar to the prior exam. Nonobstructing stones at the upper pole of the left kidney have slightly increased in size now measuring 7 to 8 mm. Smaller nonobstructing stones are present at the lower pole of both kidneys. No hydronephrosis. No perinephric or periureteral stranding. Urinary bladder is unremarkable.  GI AND BOWEL: Stomach demonstrates no acute abnormality. There is no bowel obstruction. No bowel wall thickening.  PERITONEUM AND RETROPERITONEUM: No ascites. No free air.  VASCULATURE: Aorta is normal in caliber.  LYMPH NODES: No lymphadenopathy.  REPRODUCTIVE ORGANS: No acute abnormality.  BONES AND SOFT TISSUES: Degenerative changes are again noted in the lumbar spine with grade 1 anterolisthesis and advanced facet hypertrophy bilaterally at L4-5. Remote superior endplate fractures at T12 and L1 are stable. Transitional S1 segment is noted. No acute osseous abnormality. No focal soft tissue abnormality.  IMPRESSION: 1. Multiple nonobstructing stones in both kidneys, with the largest in the right kidney measuring 9.3 mm (stable) and the largest in the left kidney measuring 7 to 8 mm (slightly increased in size). No hydronephrosis or perinephric/ periureteral stranding. 2. No acute findings.  Electronically signed by: Lonni Necessary MD 10/30/2023 08:29 AM EDT RP Workstation: [id number]   Assessment & Plan:    1. Malignant neoplasm of prostate (HCC) (Primary) -followup 3 months  - ciprofloxacin  (CIPRO ) tablet 500 mg - Bladder Voiding Trial   No follow-ups on file.  [name], MD  [facility] [place]

## 2024-01-04 NOTE — Patient Instructions (Signed)

## 2024-01-04 NOTE — Progress Notes (Signed)
 Fill and Pull Catheter Removal  Patient is present today for a catheter removal due to post brachytherapy.  150 ml of sterile water  was instilled into the bladder when the patient felt the urge to urinate. 9 ml of water  was then drained from the balloon.  A 16FR foley cath was removed from the bladder no complications were noted .  Foley catheter intact and time of removal. Patient as then given some time to void on their own.  Patient can void  98 ml on their own after some time.  Patient tolerated well.  One oral prophylactic antibiotic given per MD orders  Performed by: Exie T. CMA  Follow up/ Additional notes: as scheduled w/ MD

## 2024-01-10 NOTE — Progress Notes (Signed)
 Post-seed nursing interview for a diagnosis of 78 y.o. gentleman with Stage T1c adenocarcinoma of the prostate with Gleason score of 3+4, and PSA of 6.6.   Patient identity verified x2.   Patient states issues as follows...  -Pain: None  -Fatigue: No -Abdomen: None -Groin: None -Urinary: Nocturia -Bowels: None -Appetite: Good -Weight: 240.8  Patient denies all other related issues at this time.  Meaningful use complete.  I-PSS (AUA) score- 14 - Moderate SHIM (ED) score- 9 Urinary Management medication(s) none Urology appointment date- saw Dr. [name] at [facility] [place] on 01/04/24  Vitals- BP (!) 150/77 (BP Location: Right Arm, Patient Position: Sitting, Cuff Size: Normal)   Pulse 96   Temp 98.5 F (36.9 C) (Oral)   Resp 18   Wt 240 lb 12.8 oz (109.2 kg)   SpO2 97%   BMI 33.58 kg/m    This concludes the interaction.

## 2024-01-18 ENCOUNTER — Telehealth: Payer: Self-pay | Admitting: *Deleted

## 2024-01-18 NOTE — Telephone Encounter (Signed)
 CALLED PATIENT TO REMIND OF POST SEED APPTS AND MRI FOR 01-19-24, SPOKE WITH PATIENT'S WIFE- Marcus Simpson AND SHE IS AWARE OF THESE APPTS. AND THE INSTRUCTIONS

## 2024-01-19 ENCOUNTER — Ambulatory Visit
Admission: RE | Admit: 2024-01-19 | Discharge: 2024-01-19 | Disposition: A | Discharge status: Home / Self Care | Source: Ambulatory Visit | Attending: Urology | Admitting: Urology

## 2024-01-19 ENCOUNTER — Ambulatory Visit (HOSPITAL_COMMUNITY)
Admission: RE | Admit: 2024-01-19 | Discharge: 2024-01-19 | Disposition: A | Discharge status: Home / Self Care | Source: Ambulatory Visit | Attending: Urology | Admitting: Urology

## 2024-01-19 ENCOUNTER — Ambulatory Visit
Admission: RE | Admit: 2024-01-19 | Discharge: 2024-01-19 | Disposition: A | Discharge status: Home / Self Care | Payer: Self-pay | Source: Ambulatory Visit | Attending: Urology | Admitting: Urology

## 2024-01-19 VITALS — BP 150/77 | HR 96 | Temp 98.5°F | Resp 18 | Wt 240.8 lb

## 2024-01-19 DIAGNOSIS — C61 Malignant neoplasm of prostate: Secondary | ICD-10-CM | POA: Insufficient documentation

## 2024-01-19 NOTE — Progress Notes (Signed)
  Radiation Oncology         [phone] ________________________________  Name: Marcus Simpson MRN: [id number]  Date: 01/19/2024  DOB: 09-02-1945  COMPLEX SIMULATION NOTE  NARRATIVE:  The patient was brought to the CT Simulation planning suite today following prostate seed implantation approximately one month ago.  Identity was confirmed.  All relevant records and images related to the planned course of therapy were reviewed.  Then, the patient was set-up supine.  CT images were obtained.  The CT images were loaded into the planning software.  Then the prostate and rectum were contoured.  Treatment planning then occurred.  The implanted iodine 125 seeds were identified by the physics staff for projection of radiation distribution  I have requested : 3D Simulation  I have requested a DVH of the following structures: Prostate and rectum.    ________________________________  Marcus Simpson Patrcia, M.D.

## 2024-01-19 NOTE — Progress Notes (Signed)
 Radiation Oncology         [phone] ________________________________  Name: Marcus Simpson MRN: [id number]  Date: 01/19/2024  DOB: 1945-11-26  Post-Seed Follow-Up Visit Note  CC: Marcus Leta NOVAK, MD  [name], MD  Diagnosis:   78 y.o. gentleman with Stage T1c adenocarcinoma of the prostate with Gleason score of 3+3, and PSA of 6.6.     ICD-10-CM   1. Prostate cancer (HCC)  C61       Interval Since Last Radiation:  3 weeks 12/29/23:  Insertion of radioactive I-125 seeds into the prostate gland; 145 Gy, definitive therapy with placement of SpaceOAR gel.  Narrative:  The patient returns today for routine follow-up.  He is complaining of increased urinary frequency and urinary hesitation symptoms. He filled out a questionnaire regarding urinary function today providing and overall IPSS score of 14 characterizing his symptoms as moderate.  He specifically denies dysuria, gross hematuria, straining to void or incontinence.  His pre-implant score was 14. He denies any abdominal pain or bowel symptoms.  He reports a healthy appetite and is maintaining his weight.  He is a little more fatigued than normal but this is gradually improving.  He has noticed that his blood sugars have been elevated over the last couple of weeks and this is being followed by his PCP.  Overall, he is quite pleased with his progress to date.  ALLERGIES:  is allergic to dilaudid [hydromorphone hcl] and hydromorphone.  Meds: Current Outpatient Medications  Medication Sig Dispense Refill   aspirin  81 MG chewable tablet Chew by mouth daily.     Cholecalciferol (VITAMIN D3) 50 MCG (2000 UT) TABS Take 2,000 Units by mouth daily.     clotrimazole -betamethasone  (LOTRISONE ) cream apply TOPICALLY TWICE DAILY (Patient not taking: Reported on 01/04/2024) 42 g 1   cyanocobalamin (VITAMIN B12) 1000 MCG tablet Take 1,000 mcg by mouth daily.     doxycycline  (VIBRAMYCIN ) 100 MG capsule Take 1 capsule (100 mg total) by  mouth 2 (two) times daily. One po bid x 7 days (Patient not taking: Reported on 01/04/2024) 14 capsule 0   ELDERBERRY PO Take 1 tablet by mouth daily.     glipiZIDE (GLUCOTROL XL) 2.5 MG 24 hr tablet Take 2.5 mg by mouth daily.     indomethacin (INDOCIN) 25 MG capsule Take 25 mg by mouth 2 (two) times daily with a meal.     meclizine (ANTIVERT) 25 MG tablet Take 25 mg by mouth 3 (three) times daily as needed for dizziness.     metoprolol succinate (TOPROL-XL) 25 MG 24 hr tablet Take 25 mg by mouth 2 (two) times daily.     ONETOUCH ULTRA TEST test strip 1 each daily.     OVER THE COUNTER MEDICATION Take 1 capsule by mouth daily. Super Beets     oxyCODONE -acetaminophen  (PERCOCET/ROXICET) 5-325 MG tablet Take 1 tablet by mouth every 6 (six) hours as needed for severe pain (pain score 7-10). 15 tablet 0   predniSONE (DELTASONE) 10 MG tablet Take 10 mg by mouth 2 (two) times daily with a meal.     rosuvastatin  (CRESTOR ) 5 MG tablet Take 5 mg by mouth every Saturday.     No current facility-administered medications for this visit.    Physical Findings: In general this is a well appearing Caucasian male in no acute distress. He's alert and oriented x4 and appropriate throughout the examination. Cardiopulmonary assessment is negative for acute distress and he exhibits normal effort.   Lab Findings:  Lab Results  Component Value Date   WBC 17.1 (H) 12/23/2023   HGB 13.8 12/23/2023   HCT 43.1 12/23/2023   MCV 92.9 12/23/2023   PLT 314 12/23/2023    Radiographic Findings:  Patient underwent CT imaging in our clinic for post implant dosimetry. The CT will be fused with his prostate MRI that was performed earlier today and will be reviewed by Dr. Patrcia to confirm there is an adequate distribution of radioactive seeds throughout the prostate gland and ensure that there are no seeds in or near the rectum. We suspect the final radiation plan and dosimetry will show appropriate coverage of the prostate  gland. He understands that we will call and inform him of any unexpected findings on further review of his imaging and dosimetry.  Impression/Plan: 78 y.o. gentleman with Stage T1c adenocarcinoma of the prostate with Gleason score of 3+3, and PSA of 6.6.  The patient is recovering from the effects of radiation. His urinary symptoms should gradually improve over the next 4-6 months. We talked about this today. He is encouraged by his improvement already and is otherwise pleased with his outcome. We also talked about long-term follow-up for prostate cancer following seed implant. He understands that ongoing PSA determinations and digital rectal exams will help perform surveillance to rule out disease recurrence. He has a follow up appointment scheduled with Dr. Sherrilee on 05/04/24. He will have his first post-treatment PSA on 04/27/24 and understands what to expect with his PSA measures. Patient was also educated today about some of the long-term effects from radiation including a small risk for rectal bleeding and possibly erectile dysfunction. We talked about some of the general management approaches to these potential complications. However, I did encourage the patient to contact our office or return at any point if he has questions or concerns related to his previous radiation and prostate cancer.    Sabra MICAEL Rusk, PA-C

## 2024-01-23 ENCOUNTER — Ambulatory Visit
Admission: RE | Admit: 2024-01-23 | Discharge: 2024-01-23 | Disposition: A | Discharge status: Home / Self Care | Source: Ambulatory Visit | Attending: Radiation Oncology | Admitting: Radiation Oncology

## 2024-01-23 ENCOUNTER — Encounter: Payer: Self-pay | Admitting: Radiation Oncology

## 2024-01-23 DIAGNOSIS — C61 Malignant neoplasm of prostate: Secondary | ICD-10-CM | POA: Insufficient documentation

## 2024-01-23 NOTE — Radiation Completion Notes (Signed)
 Patient Name: Marcus Simpson, Marcus Simpson MRN: [id number] Date of Birth: 21-May-1945 Referring Physician: GARNETTE SHACK, M.D. Date of Service: 2024-01-23 Radiation Oncologist: Adina Barge, M.D. [facility]                             RADIATION ONCOLOGY END OF TREATMENT NOTE     Diagnosis: C61 Malignant neoplasm of prostate Staging on 2023-08-08: Prostate cancer (HCC) T=cT1c, N=cN0, M=cM0 Intent: Curative     ==========DELIVERED PLANS==========  Prostate Seed Implant Date: 2023-12-29   Plan Name: Prostate Seed Implant Site: Prostate Technique: Radioactive Seed Implant I-125 Mode: Brachytherapy Dose Per Fraction: 145 Gy Prescribed Dose (Delivered / Prescribed): 145 Gy / 145 Gy Prescribed Fxs (Delivered / Prescribed): 1 / 1     ==========ON TREATMENT VISIT DATES========== 2023-12-29     ==========UPCOMING VISITS==========

## 2024-01-23 NOTE — Progress Notes (Signed)
  Radiation Oncology         [phone] ________________________________  Name: Marcus Simpson MRN: [id number]  Date: 01/23/2024  DOB: 03/18/1946  3D Planning Note   Prostate Brachytherapy Post-Implant Dosimetry  Diagnosis: 78 y.o. gentleman with Stage T1c adenocarcinoma of the prostate with Gleason score of 3+4, and PSA of 6.6.   Narrative: On a previous date, [name] returned following prostate seed implantation for post implant planning. He underwent CT scan complex simulation to delineate the three-dimensional structures of the pelvis and demonstrate the radiation distribution.  Since that time, the seed localization, and complex isodose planning with dose volume histograms have now been completed.  Results:   Prostate Coverage - The dose of radiation delivered to the 90% or more of the prostate gland (D90) was 121.14% of the prescription dose. This exceeds our goal of greater than 90%. Rectal Sparing - The volume of rectal tissue receiving the prescription dose or higher was 0.0 cc. This falls under our thresholds tolerance of 1.0 cc.  Impression: The prostate seed implant appears to show adequate target coverage and appropriate rectal sparing.  Plan:  The patient will continue to follow with urology for ongoing PSA determinations. I would anticipate a high likelihood for local tumor control with minimal risk for rectal morbidity.  ________________________________  Donnice FELIX Patrcia, M.D.

## 2024-01-25 ENCOUNTER — Encounter (INDEPENDENT_AMBULATORY_CARE_PROVIDER_SITE_OTHER): Payer: Self-pay | Admitting: Gastroenterology

## 2024-02-03 ENCOUNTER — Telehealth: Payer: Self-pay

## 2024-02-03 ENCOUNTER — Ambulatory Visit (INDEPENDENT_AMBULATORY_CARE_PROVIDER_SITE_OTHER)

## 2024-02-03 ENCOUNTER — Other Ambulatory Visit: Payer: Self-pay

## 2024-02-03 DIAGNOSIS — R339 Retention of urine, unspecified: Secondary | ICD-10-CM | POA: Diagnosis not present

## 2024-02-03 LAB — BLADDER SCAN AMB NON-IMAGING: Scan Result: 278

## 2024-02-03 MED ORDER — TAMSULOSIN HCL 0.4 MG PO CAPS: 0.4000 mg | ORAL_CAPSULE | Freq: Every day | ORAL | 11 refills | Status: AC

## 2024-02-03 MED ORDER — NITROFURANTOIN MONOHYD MACRO 100 MG PO CAPS: 100.0000 mg | ORAL_CAPSULE | Freq: Two times a day (BID) | ORAL | 0 refills | Status: DC

## 2024-02-03 NOTE — Telephone Encounter (Signed)
 Marcus Simpson, 78 y.o. gentleman with Stage T1c adenocarcinoma of the prostate with Gleason score of 3+4, and PSA of 6.6.  Had seed implant surgery on 12/29/2023.  Post seed follow up 01/19/2024.  Mrs. Melecio left message they went to see the urologist today for urinary retention had bladder scan done showed 270 ml.  Reports the doctor prescribed Tamsulosin  and gave him the option to have a catheter place and he refused.  Mrs. Siler reports Mr. Hewitt wasn't happy with urologist and wants appointment to see Dr. Patrcia.  RN to inform Sabra Rusk, PA-C and Dr. Patrcia of patient request.

## 2024-02-03 NOTE — Addendum Note (Signed)
 Addended by: SAMMIE EXIE HERO on: 02/03/2024 01:39 PM   Modules accepted: Orders

## 2024-02-03 NOTE — Telephone Encounter (Signed)
 Called pt to let him know MD McKenzie reviewed his UA from his appt today and it came back positive for infection MD has sent in abx pt voiced his understanding and confirmed pharmacy

## 2024-02-03 NOTE — Addendum Note (Signed)
 Addended by: SAMMIE EXIE HERO on: 02/03/2024 01:41 PM   Modules accepted: Orders

## 2024-02-03 NOTE — Addendum Note (Signed)
 Addended by: SAMMIE EXIE HERO on: 02/03/2024 11:27 AM   Modules accepted: Level of Service

## 2024-02-03 NOTE — Progress Notes (Signed)
 Bladder Scan completed today due to reason of unable to void   Patient cannot void prior to the bladder scan. Bladder scan result: 278  Performed By: Exie DASEN. CMA  Additional notes- Per verbal from MD mcKenzie have patient start on flomax  w/ or w/o catheter depending on pt preference.   Pt preferred no catheter today Rx was sent to pt pharmacy   Patient is scheduled to follow up with MD at scheduled date and time

## 2024-02-03 NOTE — Telephone Encounter (Signed)
 Pt/wife called in and state's that pt had GoldSeed surgery last month  unable to voided and is having bladder pain. Pt scheduled for PVR.

## 2024-02-05 ENCOUNTER — Emergency Department (HOSPITAL_COMMUNITY): Admitting: Certified Registered"

## 2024-02-05 ENCOUNTER — Emergency Department (HOSPITAL_COMMUNITY)

## 2024-02-05 ENCOUNTER — Encounter (HOSPITAL_COMMUNITY): Payer: Self-pay

## 2024-02-05 ENCOUNTER — Other Ambulatory Visit: Payer: Self-pay

## 2024-02-05 ENCOUNTER — Inpatient Hospital Stay (HOSPITAL_COMMUNITY)
Admission: EM | Admit: 2024-02-05 | Discharge: 2024-02-13 | DRG: 854 | Disposition: A | Discharge status: Home Health | Attending: Internal Medicine | Admitting: Internal Medicine

## 2024-02-05 ENCOUNTER — Encounter (HOSPITAL_COMMUNITY)
Admission: EM | Disposition: A | Discharge status: Home Health | Payer: Self-pay | Source: Home / Self Care | Attending: Internal Medicine

## 2024-02-05 DIAGNOSIS — Z6833 Body mass index (BMI) 33.0-33.9, adult: Secondary | ICD-10-CM

## 2024-02-05 DIAGNOSIS — N201 Calculus of ureter: Secondary | ICD-10-CM | POA: Diagnosis present

## 2024-02-05 DIAGNOSIS — N390 Urinary tract infection, site not specified: Secondary | ICD-10-CM

## 2024-02-05 DIAGNOSIS — R319 Hematuria, unspecified: Secondary | ICD-10-CM | POA: Diagnosis present

## 2024-02-05 DIAGNOSIS — Z1611 Resistance to penicillins: Secondary | ICD-10-CM | POA: Diagnosis not present

## 2024-02-05 DIAGNOSIS — C61 Malignant neoplasm of prostate: Secondary | ICD-10-CM | POA: Diagnosis present

## 2024-02-05 DIAGNOSIS — N132 Hydronephrosis with renal and ureteral calculous obstruction: Secondary | ICD-10-CM | POA: Diagnosis not present

## 2024-02-05 DIAGNOSIS — I38 Endocarditis, valve unspecified: Secondary | ICD-10-CM | POA: Diagnosis present

## 2024-02-05 DIAGNOSIS — R7881 Bacteremia: Secondary | ICD-10-CM | POA: Diagnosis not present

## 2024-02-05 DIAGNOSIS — N35912 Unspecified bulbous urethral stricture, male: Secondary | ICD-10-CM | POA: Diagnosis present

## 2024-02-05 DIAGNOSIS — D630 Anemia in neoplastic disease: Secondary | ICD-10-CM | POA: Diagnosis present

## 2024-02-05 DIAGNOSIS — E66811 Obesity, class 1: Secondary | ICD-10-CM | POA: Diagnosis present

## 2024-02-05 DIAGNOSIS — R600 Localized edema: Secondary | ICD-10-CM | POA: Diagnosis not present

## 2024-02-05 DIAGNOSIS — A411 Sepsis due to other specified staphylococcus: Principal | ICD-10-CM | POA: Diagnosis present

## 2024-02-05 DIAGNOSIS — M10061 Idiopathic gout, right knee: Secondary | ICD-10-CM | POA: Diagnosis not present

## 2024-02-05 DIAGNOSIS — N202 Calculus of kidney with calculus of ureter: Secondary | ICD-10-CM | POA: Diagnosis present

## 2024-02-05 DIAGNOSIS — I959 Hypotension, unspecified: Secondary | ICD-10-CM | POA: Diagnosis not present

## 2024-02-05 DIAGNOSIS — E871 Hypo-osmolality and hyponatremia: Secondary | ICD-10-CM | POA: Diagnosis present

## 2024-02-05 DIAGNOSIS — Z96652 Presence of left artificial knee joint: Secondary | ICD-10-CM | POA: Diagnosis present

## 2024-02-05 DIAGNOSIS — Z87891 Personal history of nicotine dependence: Secondary | ICD-10-CM

## 2024-02-05 DIAGNOSIS — N136 Pyonephrosis: Secondary | ICD-10-CM | POA: Diagnosis present

## 2024-02-05 DIAGNOSIS — N178 Other acute kidney failure: Secondary | ICD-10-CM | POA: Diagnosis present

## 2024-02-05 DIAGNOSIS — E785 Hyperlipidemia, unspecified: Secondary | ICD-10-CM | POA: Diagnosis present

## 2024-02-05 DIAGNOSIS — Z7984 Long term (current) use of oral hypoglycemic drugs: Secondary | ICD-10-CM

## 2024-02-05 DIAGNOSIS — Z743 Need for continuous supervision: Secondary | ICD-10-CM | POA: Diagnosis not present

## 2024-02-05 DIAGNOSIS — R509 Fever, unspecified: Secondary | ICD-10-CM | POA: Diagnosis not present

## 2024-02-05 DIAGNOSIS — N179 Acute kidney failure, unspecified: Secondary | ICD-10-CM | POA: Diagnosis present

## 2024-02-05 DIAGNOSIS — N281 Cyst of kidney, acquired: Secondary | ICD-10-CM | POA: Diagnosis present

## 2024-02-05 DIAGNOSIS — M25461 Effusion, right knee: Secondary | ICD-10-CM | POA: Diagnosis not present

## 2024-02-05 DIAGNOSIS — Z888 Allergy status to other drugs, medicaments and biological substances status: Secondary | ICD-10-CM

## 2024-02-05 DIAGNOSIS — E8721 Acute metabolic acidosis: Secondary | ICD-10-CM | POA: Diagnosis present

## 2024-02-05 DIAGNOSIS — N10 Acute tubulo-interstitial nephritis: Secondary | ICD-10-CM

## 2024-02-05 DIAGNOSIS — M109 Gout, unspecified: Secondary | ICD-10-CM | POA: Diagnosis present

## 2024-02-05 DIAGNOSIS — Z8546 Personal history of malignant neoplasm of prostate: Secondary | ICD-10-CM | POA: Diagnosis not present

## 2024-02-05 DIAGNOSIS — R6520 Severe sepsis without septic shock: Secondary | ICD-10-CM | POA: Diagnosis present

## 2024-02-05 DIAGNOSIS — I10 Essential (primary) hypertension: Secondary | ICD-10-CM

## 2024-02-05 DIAGNOSIS — Z6834 Body mass index (BMI) 34.0-34.9, adult: Secondary | ICD-10-CM

## 2024-02-05 DIAGNOSIS — A419 Sepsis, unspecified organism: Principal | ICD-10-CM

## 2024-02-05 DIAGNOSIS — M170 Bilateral primary osteoarthritis of knee: Secondary | ICD-10-CM | POA: Diagnosis present

## 2024-02-05 DIAGNOSIS — Z1152 Encounter for screening for COVID-19: Secondary | ICD-10-CM | POA: Diagnosis not present

## 2024-02-05 DIAGNOSIS — Z7982 Long term (current) use of aspirin: Secondary | ICD-10-CM

## 2024-02-05 DIAGNOSIS — Z79899 Other long term (current) drug therapy: Secondary | ICD-10-CM

## 2024-02-05 DIAGNOSIS — E119 Type 2 diabetes mellitus without complications: Secondary | ICD-10-CM | POA: Diagnosis present

## 2024-02-05 DIAGNOSIS — R001 Bradycardia, unspecified: Secondary | ICD-10-CM | POA: Diagnosis present

## 2024-02-05 DIAGNOSIS — D638 Anemia in other chronic diseases classified elsewhere: Secondary | ICD-10-CM | POA: Diagnosis not present

## 2024-02-05 DIAGNOSIS — B957 Other staphylococcus as the cause of diseases classified elsewhere: Principal | ICD-10-CM

## 2024-02-05 HISTORY — PX: CYSTOSCOPY WITH STENT PLACEMENT: SHX5790

## 2024-02-05 LAB — URINALYSIS, W/ REFLEX TO CULTURE (INFECTION SUSPECTED)
Bacteria, UA: NONE SEEN
Bilirubin Urine: NEGATIVE
Glucose, UA: NEGATIVE mg/dL
Ketones, ur: NEGATIVE mg/dL
Leukocytes,Ua: NEGATIVE
Nitrite: POSITIVE — AB
Protein, ur: 100 mg/dL — AB
Specific Gravity, Urine: 1.019 (ref 1.005–1.030)
pH: 5 (ref 5.0–8.0)

## 2024-02-05 LAB — LACTIC ACID, PLASMA
Lactic Acid, Venous: 3 mmol/L (ref 0.5–1.9)
Lactic Acid, Venous: 2.7 mmol/L (ref 0.5–1.9)
Lactic Acid, Venous: 1.5 mmol/L (ref 0.5–1.9)

## 2024-02-05 LAB — MAGNESIUM: Magnesium: 1.8 mg/dL (ref 1.7–2.4)

## 2024-02-05 LAB — RESP PANEL BY RT-PCR (RSV, FLU A&B, COVID)  RVPGX2
Influenza A by PCR: NEGATIVE
Influenza B by PCR: NEGATIVE
Resp Syncytial Virus by PCR: NEGATIVE
SARS Coronavirus 2 by RT PCR: NEGATIVE

## 2024-02-05 LAB — CBC WITH DIFFERENTIAL/PLATELET
Abs Immature Granulocytes: 0.17 K/uL — ABNORMAL HIGH (ref 0.00–0.07)
Basophils Absolute: 0.1 K/uL (ref 0.0–0.1)
Basophils Relative: 0 %
Eosinophils Absolute: 0.1 K/uL (ref 0.0–0.5)
Eosinophils Relative: 0 %
HCT: 34.7 % — ABNORMAL LOW (ref 39.0–52.0)
Hemoglobin: 11.8 g/dL — ABNORMAL LOW (ref 13.0–17.0)
Immature Granulocytes: 1 %
Lymphocytes Relative: 3 %
Lymphs Abs: 0.7 K/uL (ref 0.7–4.0)
MCH: 31.1 pg (ref 26.0–34.0)
MCHC: 34 g/dL (ref 30.0–36.0)
MCV: 91.6 fL (ref 80.0–100.0)
Monocytes Absolute: 1.4 K/uL — ABNORMAL HIGH (ref 0.1–1.0)
Monocytes Relative: 5 %
Neutro Abs: 22.9 K/uL — ABNORMAL HIGH (ref 1.7–7.7)
Neutrophils Relative %: 91 %
Platelets: 257 K/uL (ref 150–400)
RBC: 3.79 MIL/uL — ABNORMAL LOW (ref 4.22–5.81)
RDW: 13.2 % (ref 11.5–15.5)
Smear Review: NORMAL
WBC: 25.2 K/uL — ABNORMAL HIGH (ref 4.0–10.5)
nRBC: 0 % (ref 0.0–0.2)

## 2024-02-05 LAB — GLUCOSE, CAPILLARY
Glucose-Capillary: 131 mg/dL — ABNORMAL HIGH (ref 70–99)
Glucose-Capillary: 137 mg/dL — ABNORMAL HIGH (ref 70–99)
Glucose-Capillary: 163 mg/dL — ABNORMAL HIGH (ref 70–99)

## 2024-02-05 LAB — COMPREHENSIVE METABOLIC PANEL WITH GFR
ALT: 12 U/L (ref 0–44)
AST: 19 U/L (ref 15–41)
Albumin: 3.9 g/dL (ref 3.5–5.0)
Alkaline Phosphatase: 92 U/L (ref 38–126)
Anion gap: 17 — ABNORMAL HIGH (ref 5–15)
BUN: 38 mg/dL — ABNORMAL HIGH (ref 8–23)
CO2: 19 mmol/L — ABNORMAL LOW (ref 22–32)
Calcium: 8.9 mg/dL (ref 8.9–10.3)
Chloride: 98 mmol/L (ref 98–111)
Creatinine, Ser: 3.61 mg/dL — ABNORMAL HIGH (ref 0.61–1.24)
GFR, Estimated: 16 mL/min — ABNORMAL LOW (ref >60)
Glucose, Bld: 190 mg/dL — ABNORMAL HIGH (ref 70–99)
Potassium: 4.2 mmol/L (ref 3.5–5.1)
Sodium: 134 mmol/L — ABNORMAL LOW (ref 135–145)
Total Bilirubin: 1.1 mg/dL (ref 0.0–1.2)
Total Protein: 7.6 g/dL (ref 6.5–8.1)

## 2024-02-05 LAB — PROTIME-INR
INR: 1.2 (ref 0.8–1.2)
Prothrombin Time: 15.9 s — ABNORMAL HIGH (ref 11.4–15.2)

## 2024-02-05 SURGERY — CYSTOSCOPY, WITH STENT INSERTION
Anesthesia: General | Site: Penis | Laterality: Left

## 2024-02-05 MED ORDER — ROCURONIUM BROMIDE 10 MG/ML (PF) SYRINGE
PREFILLED_SYRINGE | INTRAVENOUS | Status: DC | PRN
  Administered 2024-02-05: 30 mg via INTRAVENOUS

## 2024-02-05 MED ORDER — ACETAMINOPHEN 650 MG RE SUPP
650.0000 mg | Freq: Four times a day (QID) | RECTAL | Status: DC | PRN
Start: 2024-02-05 — End: 2024-02-13

## 2024-02-05 MED ORDER — DEXAMETHASONE SOD PHOSPHATE PF 10 MG/ML IJ SOLN
INTRAMUSCULAR | Status: DC | PRN
  Administered 2024-02-05: 4 mg via INTRAVENOUS

## 2024-02-05 MED ORDER — TAMSULOSIN HCL 0.4 MG PO CAPS
0.4000 mg | ORAL_CAPSULE | Freq: Every day | ORAL | Status: DC
  Administered 2024-02-06 – 2024-02-13 (×8): 0.4 mg via ORAL
  Filled 2024-02-05 (×8): qty 1

## 2024-02-05 MED ORDER — SODIUM CHLORIDE 0.9% FLUSH
3.0000 mL | Freq: Two times a day (BID) | INTRAVENOUS | Status: DC
  Administered 2024-02-05 – 2024-02-12 (×14): 3 mL via INTRAVENOUS

## 2024-02-05 MED ORDER — SODIUM CHLORIDE 0.9 % IV SOLN
2.0000 g | Freq: Once | INTRAVENOUS | Status: AC
  Administered 2024-02-05: 2 g via INTRAVENOUS
  Filled 2024-02-05: qty 20

## 2024-02-05 MED ORDER — STERILE WATER FOR IRRIGATION IR SOLN
Status: DC | PRN
  Administered 2024-02-05: 3000 mL

## 2024-02-05 MED ORDER — ACETAMINOPHEN 325 MG PO TABS
650.0000 mg | ORAL_TABLET | Freq: Four times a day (QID) | ORAL | Status: DC | PRN
  Administered 2024-02-09 – 2024-02-11 (×4): 650 mg via ORAL
  Filled 2024-02-05 (×4): qty 2

## 2024-02-05 MED ORDER — PHENYLEPHRINE 80 MCG/ML (10ML) SYRINGE FOR IV PUSH (FOR BLOOD PRESSURE SUPPORT)
PREFILLED_SYRINGE | INTRAVENOUS | Status: DC | PRN
Start: 2024-02-05 — End: 2024-02-05
  Administered 2024-02-05: 80 ug via INTRAVENOUS
  Administered 2024-02-05: 160 ug via INTRAVENOUS

## 2024-02-05 MED ORDER — LACTATED RINGERS IV BOLUS (SEPSIS)
1000.0000 mL | Freq: Once | INTRAVENOUS | Status: AC
  Administered 2024-02-05: 1000 mL via INTRAVENOUS

## 2024-02-05 MED ORDER — SUCCINYLCHOLINE CHLORIDE 200 MG/10ML IV SOSY
PREFILLED_SYRINGE | INTRAVENOUS | Status: DC | PRN
Start: 2024-02-05 — End: 2024-02-05
  Administered 2024-02-05: 160 mg via INTRAVENOUS

## 2024-02-05 MED ORDER — ONDANSETRON HCL 4 MG/2ML IJ SOLN
INTRAMUSCULAR | Status: DC | PRN
  Administered 2024-02-05: 4 mg via INTRAVENOUS

## 2024-02-05 MED ORDER — VITAMIN B-12 1000 MCG PO TABS
1000.0000 ug | ORAL_TABLET | Freq: Every day | ORAL | Status: DC
  Administered 2024-02-06 – 2024-02-13 (×8): 1000 ug via ORAL
  Filled 2024-02-05 (×9): qty 1

## 2024-02-05 MED ORDER — ROSUVASTATIN CALCIUM 5 MG PO TABS
5.0000 mg | ORAL_TABLET | ORAL | Status: DC
  Administered 2024-02-11: 5 mg via ORAL
  Filled 2024-02-05: qty 1

## 2024-02-05 MED ORDER — FENTANYL CITRATE (PF) 50 MCG/ML IJ SOSY: 25.0000 ug | PREFILLED_SYRINGE | INTRAMUSCULAR | Status: DC | PRN

## 2024-02-05 MED ORDER — IOHEXOL 300 MG/ML  SOLN
INTRAMUSCULAR | Status: DC | PRN
  Administered 2024-02-05: 50 mL via URETHRAL

## 2024-02-05 MED ORDER — FENTANYL CITRATE (PF) 100 MCG/2ML IJ SOLN
INTRAMUSCULAR | Status: DC | PRN
  Administered 2024-02-05 (×2): 50 ug via INTRAVENOUS

## 2024-02-05 MED ORDER — LACTATED RINGERS IV SOLN: INTRAVENOUS | Status: AC

## 2024-02-05 MED ORDER — HEPARIN SODIUM (PORCINE) 5000 UNIT/ML IJ SOLN
5000.0000 [IU] | Freq: Three times a day (TID) | INTRAMUSCULAR | Status: DC
  Administered 2024-02-05 – 2024-02-13 (×24): 5000 [IU] via SUBCUTANEOUS
  Filled 2024-02-05 (×24): qty 1

## 2024-02-05 MED ORDER — LIDOCAINE HCL (PF) 2 % IJ SOLN
INTRAMUSCULAR | Status: DC | PRN
  Administered 2024-02-05: 40 mg via INTRADERMAL

## 2024-02-05 MED ORDER — PROPOFOL 10 MG/ML IV BOLUS
INTRAVENOUS | Status: DC | PRN
  Administered 2024-02-05: 50 mg via INTRAVENOUS
  Administered 2024-02-05: 150 mg via INTRAVENOUS

## 2024-02-05 MED ORDER — SUGAMMADEX SODIUM 200 MG/2ML IV SOLN
INTRAVENOUS | Status: DC | PRN
  Administered 2024-02-05: 150 mg via INTRAVENOUS

## 2024-02-05 MED ORDER — SODIUM CHLORIDE 0.9 % IV SOLN: INTRAVENOUS | Status: DC | PRN

## 2024-02-05 MED ORDER — SENNOSIDES-DOCUSATE SODIUM 8.6-50 MG PO TABS
1.0000 | ORAL_TABLET | Freq: Every evening | ORAL | Status: DC | PRN
Start: 2024-02-05 — End: 2024-02-07

## 2024-02-05 MED ORDER — SODIUM CHLORIDE 0.9 % IV SOLN
2.0000 g | INTRAVENOUS | Status: DC
  Administered 2024-02-06 – 2024-02-07 (×2): 2 g via INTRAVENOUS
  Filled 2024-02-05 (×2): qty 20

## 2024-02-05 SURGICAL SUPPLY — 13 items
BAG URINE DRAIN 2000ML AR STRL (UROLOGICAL SUPPLIES) IMPLANT
BAG URO CATCHER STRL LF (MISCELLANEOUS) ×2 IMPLANT
CATH FOLEY 2W COUNCIL 20FR 5CC (CATHETERS) IMPLANT
CATH URETL OPEN 5X70 (CATHETERS) ×2 IMPLANT
CLOTH BEACON ORANGE TIMEOUT ST (SAFETY) ×2 IMPLANT
GLOVE BIO SURGEON STRL SZ8 (GLOVE) ×2 IMPLANT
GOWN STRL REUS W/ TWL XL LVL3 (GOWN DISPOSABLE) ×2 IMPLANT
GUIDEWIRE STR DUAL SENSOR (WIRE) ×2 IMPLANT
KIT TURNOVER KIT A (KITS) ×2 IMPLANT
MANIFOLD NEPTUNE II (INSTRUMENTS) ×2 IMPLANT
PACK CYSTO (CUSTOM PROCEDURE TRAY) ×2 IMPLANT
STENT URET 6FRX26 CONTOUR (STENTS) IMPLANT
TUBING CONNECTING 10 (TUBING) IMPLANT

## 2024-02-05 NOTE — ED Notes (Signed)
 [facility] called for transport for pt

## 2024-02-05 NOTE — Sepsis Progress Note (Signed)
[place] code sepsis

## 2024-02-05 NOTE — Transfer of Care (Signed)
 Immediate Anesthesia Transfer of Care Note  Patient: Marcus Simpson  Procedure(s) Performed: CYSTOSCOPY, WITH STENT INSERTION (Left)  Patient Location: PACU  Anesthesia Type:General  Level of Consciousness: awake, alert , oriented, and patient cooperative  Airway & Oxygen Therapy: Patient Spontanous Breathing and Patient connected to face mask oxygen  Post-op Assessment: Report given to RN and Post -op Vital signs reviewed and stable  Post vital signs: Reviewed and stable  Last Vitals:  Vitals Value Taken Time  BP 149/64 02/05/24 20:56  Temp    Pulse 91 02/05/24 20:58  Resp 27 02/05/24 20:58  SpO2 92 % 02/05/24 20:58  Vitals shown include unfiled device data.  Last Pain:  Vitals:   02/05/24 1727  TempSrc: Oral  PainSc:          Complications: No notable events documented.

## 2024-02-05 NOTE — Anesthesia Procedure Notes (Signed)
 Procedure Name: Intubation Date/Time: 02/05/2024 8:16 PM  Performed by: Franchot Delon RAMAN, CRNAPre-anesthesia Checklist: Patient identified, Emergency Drugs available, Suction available and Patient being monitored Patient Re-evaluated:Patient Re-evaluated prior to induction Oxygen Delivery Method: Circle System Utilized Preoxygenation: Pre-oxygenation with 100% oxygen Induction Type: IV induction Ventilation: Mask ventilation without difficulty Laryngoscope Size: Mac and 4 Grade View: Grade I Tube type: Oral Tube size: 7.5 mm Number of attempts: 1 Airway Equipment and Method: Stylet Placement Confirmation: ETT inserted through vocal cords under direct vision, positive ETCO2 and breath sounds checked- equal and bilateral Secured at: 22 cm Tube secured with: Tape Dental Injury: Teeth and Oropharynx as per pre-operative assessment

## 2024-02-05 NOTE — ED Triage Notes (Signed)
 Pt arrived POV with wife stating pt has a kidney infection and is on Abt but got a 101.4 fever this morning. Pt has not taking any meds for fever.

## 2024-02-05 NOTE — ED Notes (Addendum)
 Patient's wife notified that he will be transported to [facility] for further treatment. Wife verbalized understanding.

## 2024-02-05 NOTE — Consult Note (Signed)
 I have been asked to see the patient by Dr. Sherran Sarah, for evaluation and management of left obstructing ureteral stone and pyelonephritis.  History of present illness: 78 year old male who presented to ER today after week of feeling ill states that he has had nausea vomiting for the past several days also had a fever yesterday of 101, porridge in the ER CT demonstrated a left ureteral stone.  Upon reaching the ER patient was also noted to have AKI with creatinine of 3.6 his baseline creatinine is 1.4 he noted to have a white count of 25 and a lactate of 3  CT demonstrated a left mid ureteral stone with upstream hydronephrosis.  There is also a right nonobstructing stones.  Patient is also under noted to have gone through brachytherapy on 12/29/2023.  This was done with Dr. Sherrilee for grade group 2 prostate cancer  Patient states he had stents before he also has a history of ESWL.  PMH: Diabetes, bradycardia, prostate cancer, history of nephrolithiasis.   Review of systems: A 12 point comprehensive review of systems was obtained and is negative unless otherwise stated in the history of present illness.  Patient Active Problem List   Diagnosis Date Noted   Sepsis secondary to UTI (HCC) 12/29/2022   Acute encephalopathy 12/29/2022   Hydronephrosis with obstructing calculus 12/29/2022   Ureteral stone with hydronephrosis 12/28/2022   OA (osteoarthritis) of knee 05/02/2017   Diarrhea 03/10/2015   Prostate cancer (HCC) 03/10/2015   HYPERLIPIDEMIA-MIXED 11/13/2008   Essential hypertension 11/13/2008   SYNCOPE AND COLLAPSE 11/13/2008    No current facility-administered medications on file prior to encounter.   Current Outpatient Medications on File Prior to Encounter  Medication Sig Dispense Refill   aspirin  81 MG chewable tablet Chew 81 mg by mouth daily.     cyanocobalamin (VITAMIN B12) 1000 MCG tablet Take 1,000 mcg by mouth daily.     glipiZIDE (GLUCOTROL XL) 2.5 MG 24 hr  tablet Take 2.5 mg by mouth daily.     indomethacin (INDOCIN) 25 MG capsule Take 25 mg by mouth 2 (two) times daily with a meal.     meclizine (ANTIVERT) 25 MG tablet Take 25 mg by mouth 3 (three) times daily as needed for dizziness.     metoprolol succinate (TOPROL-XL) 25 MG 24 hr tablet Take 25 mg by mouth daily.     oxyCODONE -acetaminophen  (PERCOCET/ROXICET) 5-325 MG tablet Take 1 tablet by mouth every 6 (six) hours as needed for severe pain (pain score 7-10). 15 tablet 0   rosuvastatin  (CRESTOR ) 5 MG tablet Take 5 mg by mouth every Saturday.     silver sulfADIAZINE (SILVADENE) 1 % cream Apply 1 Application topically 2 (two) times daily.     tamsulosin  (FLOMAX ) 0.4 MG CAPS capsule Take 1 capsule (0.4 mg total) by mouth daily. 30 capsule 11   traMADol (ULTRAM) 50 MG tablet Take 50 mg by mouth 3 (three) times daily as needed for moderate pain (pain score 4-6).     triamcinolone  cream (KENALOG ) 0.1 % Apply 1 Application topically 2 (two) times daily.     ciprofloxacin  (CIPRO ) 500 MG tablet Take 500 mg by mouth 2 (two) times daily. (Patient not taking: Reported on 02/05/2024)     nitrofurantoin, macrocrystal-monohydrate, (MACROBID) 100 MG capsule Take 1 capsule (100 mg total) by mouth every 12 (twelve) hours. (Patient not taking: Reported on 02/05/2024) 14 capsule 0   ONETOUCH ULTRA TEST test strip 1 each daily.      Past Medical History:  Diagnosis Date   Arthritis    Bradycardia    asymptomatic    Diabetes mellitus without complication (HCC)    History of kidney stones    Hypertension    Prostate cancer [facility])     Past Surgical History:  Procedure Laterality Date   COLONOSCOPY     CYSTOSCOPY WITH STENT PLACEMENT Right 12/28/2022   Procedure: CYSTOSCOPY WITH STENT PLACEMENT;  Surgeon: Carolee Sherwood [name], MD;  Location: [facility] OR;  Service: Urology;  Laterality: Right;   EXTRACORPOREAL SHOCK WAVE LITHOTRIPSY Right 01/25/2023   Procedure: EXTRACORPOREAL SHOCK WAVE LITHOTRIPSY (ESWL);  Surgeon:  [name], MD;  Location: [facility] ORS;  Service: Urology;  Laterality: Right;   FLEXIBLE SIGMOIDOSCOPY N/A 04/02/2015   Procedure: FLEXIBLE SIGMOIDOSCOPY;  Surgeon: Claudis RAYMOND Rivet, MD;  Location: [facility] END[facility] SUITE;  Service: Endoscopy;  Laterality: N/A;  1:45 - moved to 1/11 @ 8:30 - Ann to notify   KIDNEY STONE SURGERY     RADIOACTIVE SEED IMPLANT N/A 12/29/2023   Procedure: INSERTION, RADIATION SOURCE, PROSTATE;  Surgeon: Sherrilee Belvie CROME, MD;  Location: [facility] ORS;  Service: Urology;  Laterality: N/A;   SPACE OAR INSTILLATION N/A 12/29/2023   Procedure: INJECTION, HYDROGEL SPACER;  Surgeon: Sherrilee Belvie CROME, MD;  Location: [facility] ORS;  Service: Urology;  Laterality: N/A;   TOTAL KNEE ARTHROPLASTY Left 05/02/2017   Procedure: LEFT TOTAL KNEE ARTHROPLASTY;  Surgeon: Melodi Lerner, MD;  Location: [facility] ORS;  Service: Orthopedics;  Laterality: Left;    Social History   Tobacco Use   Smoking status: Former    Types: Cigarettes   Smokeless tobacco: Never   Tobacco comments:    quit at age 77   Vaping Use   Vaping status: Never Used  Substance Use Topics   Alcohol use: No    Alcohol/week: 0.0 standard drinks of alcohol   Drug use: No    History reviewed. No pertinent family history.  PE: Vitals:   02/05/24 1727 02/05/24 1730 02/05/24 1745 02/05/24 1800  BP: 125/75 (!) 114/103 101/63 (!) 116/58  Pulse:  92 90 87  Resp: (!) 21 (!) 28 20 (!) 23  Temp: 98.9 F (37.2 C)     TempSrc: Oral     SpO2: 94% 91% 93% 92%  Weight:      Height:       General: No acute distress Respiratory: Normal work of breathing room air Cardiovascular: Regular rate and rhythm  Recent Labs    02/05/24 1443  WBC 25.2*  HGB 11.8*  HCT 34.7*   Recent Labs    02/05/24 1443  NA 134*  K 4.2  CL 98  CO2 19*  GLUCOSE 190*  BUN 38*  CREATININE 3.61*  CALCIUM  8.9   Recent Labs    02/05/24 1443  INR 1.2   No results for input(s): LABURIN in the last 72 hours. Results for orders placed or  performed during the hospital encounter of 02/05/24  Resp panel by RT-PCR (RSV, Flu A&B, Covid) Anterior Nasal Swab     Status: None   Collection Time: 02/05/24  1:58 PM   Specimen: Anterior Nasal Swab  Result Value Ref Range Status   SARS Coronavirus 2 by RT PCR NEGATIVE NEGATIVE Final    Comment: (NOTE) SARS-CoV-2 target nucleic acids are NOT DETECTED.  The SARS-CoV-2 RNA is generally detectable in upper respiratory specimens during the acute phase of infection. The lowest concentration of SARS-CoV-2 viral copies this assay can detect is 138 copies/mL. A negative result does  not preclude SARS-Cov-2 infection and should not be used as the sole basis for treatment or other patient management decisions. A negative result may occur with  improper specimen collection/handling, submission of specimen other than nasopharyngeal swab, presence of viral mutation(s) within the areas targeted by this assay, and inadequate number of viral copies(<138 copies/mL). A negative result must be combined with clinical observations, patient history, and epidemiological information. The expected result is Negative.  Fact Sheet for Patients:  bloggercourse.com  Fact Sheet for Healthcare Providers:  seriousbroker.it  This test is no t yet approved or cleared by the [place]  FDA and  has been authorized for detection and/or diagnosis of SARS-CoV-2 by FDA under an Emergency Use Authorization (EUA). This EUA will remain  in effect (meaning this test can be used) for the duration of the COVID-19 declaration under Section 564(b)(1) of the Act, 21 U.S.C.section 360bbb-3(b)(1), unless the authorization is terminated  or revoked sooner.       Influenza A by PCR NEGATIVE NEGATIVE Final   Influenza B by PCR NEGATIVE NEGATIVE Final    Comment: (NOTE) The Xpert Xpress SARS-CoV-2/FLU/RSV plus assay is intended as an aid in the diagnosis of influenza from  Nasopharyngeal swab specimens and should not be used as a sole basis for treatment. Nasal washings and aspirates are unacceptable for Xpert Xpress SARS-CoV-2/FLU/RSV testing.  Fact Sheet for Patients: bloggercourse.com  Fact Sheet for Healthcare Providers: seriousbroker.it  This test is not yet approved or cleared by the [place]  FDA and has been authorized for detection and/or diagnosis of SARS-CoV-2 by FDA under an Emergency Use Authorization (EUA). This EUA will remain in effect (meaning this test can be used) for the duration of the COVID-19 declaration under Section 564(b)(1) of the Act, 21 U.S.C. section 360bbb-3(b)(1), unless the authorization is terminated or revoked.     Resp Syncytial Virus by PCR NEGATIVE NEGATIVE Final    Comment: (NOTE) Fact Sheet for Patients: bloggercourse.com  Fact Sheet for Healthcare Providers: seriousbroker.it  This test is not yet approved or cleared by the [place]  FDA and has been authorized for detection and/or diagnosis of SARS-CoV-2 by FDA under an Emergency Use Authorization (EUA). This EUA will remain in effect (meaning this test can be used) for the duration of the COVID-19 declaration under Section 564(b)(1) of the Act, 21 U.S.C. section 360bbb-3(b)(1), unless the authorization is terminated or revoked.  Performed at [facility], [address] [place], [place] [address]   Blood Culture (routine x 2)     Status: None (Preliminary result)   Collection Time: 02/05/24  2:09 PM   Specimen: BLOOD  Result Value Ref Range Status   Specimen Description BLOOD BLOOD RIGHT FOREARM  Final   Special Requests   Final    BOTTLES DRAWN AEROBIC AND ANAEROBIC Blood Culture adequate volume Performed at [facility], [address] [place], [place] [address]    Culture PENDING  Incomplete   Report Status PENDING  Incomplete  Blood  Culture (routine x 2)     Status: None (Preliminary result)   Collection Time: 02/05/24  2:43 PM   Specimen: BLOOD  Result Value Ref Range Status   Specimen Description BLOOD BLOOD LEFT ARM  Final   Special Requests   Final    BOTTLES DRAWN AEROBIC AND ANAEROBIC Blood Culture adequate volume Performed at [facility], [address], [place], [place] [address]    Culture PENDING  Incomplete   Report Status PENDING  Incomplete    Imaging: CT  02/05/24 IMPRESSION: 1. 6 mm obstructing renal calculus within the proximal to mid left ureter. 2. Multiple bilateral subcentimeter non-obstructing renal calculi. 3. Stable bilateral parenchymal and peripelvic renal cysts. No follow-up imaging is recommended. This recommendation follows ACR consensus guidelines: Management of the Incidental Renal Mass on CT: A White Paper of the ACR Incidental Findings Committee. J Am Coll Radiol [phone]. 4. Mild to moderate severity diffuse urinary bladder wall thickening which may represent sequelae associated with cystitis. Correlation with urinalysis is recommended. 5. 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 6. Aortic atherosclerosis.   Imp: 78 year old male with left obstructive pyelonephritis.  We discussed risk benefits alternatives to stent.  This included bleeding infection and damage to surrounding structures surrounding structures including ureter as well as urethra.  We discussed the need for stent postoperatively as well as the potential symptoms of stent placement.  We discussed possible inability to complete procedure due to caliber of your ureter or inability to pass stone possibly requiring long-term stent versus nephrostomy tube.  We discussed need for second surgery to remove the stone.  Patient voiced their understanding and consent was obtained.   Recommendations: - Plan for left ureteral stent placement - Keep n.p.o. -  Recommend admit to medicine - Patient received ceftriaxone  around 2:40 PM  Thank you for involving me in this patient's care, I will continue to follow along.Please page with any further questions or concerns. Steffan JAYSON Pea

## 2024-02-05 NOTE — ED Provider Notes (Signed)
  EMERGENCY DEPARTMENT AT [facility] Provider Note   CSN: [id number] Arrival date & time: 02/05/24  1346     Patient presents with: Fever   Marcus Simpson is a 78 y.o. male.Patient has history of hypertension, diabetes, prostate cancer.  Presents to ER today complaining of fever and chills.  He reports that about a week ago he was treated for UTI and was feeling better, saw his urologist on Friday and got a phone call but urinalysis was positive and is started on Macrobid for UTI.  He woke up at 4 AM vomiting early this morning, went back to sleep and woke up feeling normal was able to eat breakfast but shortly prior to arrival started having chills and bodyaches, had a fever of 101.4 Fahrenheit at home.  Chest pain or shortness of breath, no abdominal pain, no flank pain.  He reports he has had a septic kidney stone in the past that required emergent treatment in [place].    Fever      Prior to Admission medications   Medication Sig Start Date End Date Taking? Authorizing Provider  aspirin  81 MG chewable tablet Chew 81 mg by mouth daily.    [provider]  Cholecalciferol (VITAMIN D3) 50 MCG (2000 UT) TABS Take 2,000 Units by mouth daily.    [provider]  ciprofloxacin  (CIPRO ) 500 MG tablet Take 500 mg by mouth 2 (two) times daily. 01/23/24   [provider]  clotrimazole -betamethasone  (LOTRISONE ) cream apply TOPICALLY TWICE DAILY Patient taking differently: Apply 1 Application topically 2 (two) times daily. 10/11/23   [name], MD  cyanocobalamin (VITAMIN B12) 1000 MCG tablet Take 1,000 mcg by mouth daily.    [provider]  doxycycline  (VIBRAMYCIN ) 100 MG capsule Take 1 capsule (100 mg total) by mouth 2 (two) times daily. One po bid x 7 days 10/30/23   Suzette Pac, MD  ELDERBERRY PO Take 1 tablet by mouth daily.    [provider]  glipiZIDE (GLUCOTROL XL) 2.5 MG 24 hr tablet Take 2.5 mg by mouth  daily. 11/26/22   [provider]  indomethacin (INDOCIN) 25 MG capsule Take 25 mg by mouth 2 (two) times daily with a meal. 05/20/21   [provider]  meclizine (ANTIVERT) 25 MG tablet Take 25 mg by mouth 3 (three) times daily as needed for dizziness.    [provider]  metoprolol succinate (TOPROL-XL) 25 MG 24 hr tablet Take 25 mg by mouth 2 (two) times daily. 09/01/20   [provider]  nitrofurantoin, macrocrystal-monohydrate, (MACROBID) 100 MG capsule Take 1 capsule (100 mg total) by mouth every 12 (twelve) hours. 02/03/24   McKenzie, Belvie CROME, MD  [facility] ULTRA TEST test strip 1 each daily. 07/15/23   [provider]  OVER THE COUNTER MEDICATION Take 1 capsule by mouth daily. Super Beets    [provider]  oxyCODONE -acetaminophen  (PERCOCET/ROXICET) 5-325 MG tablet Take 1 tablet by mouth every 6 (six) hours as needed for severe pain (pain score 7-10). 12/29/23   McKenzie, Belvie CROME, MD  predniSONE (DELTASONE) 10 MG tablet Take 10 mg by mouth 2 (two) times daily with a meal.    [provider]  rosuvastatin  (CRESTOR ) 5 MG tablet Take 5 mg by mouth every Saturday.    [provider]  silver sulfADIAZINE (SILVADENE) 1 % cream Apply 1 Application topically 2 (two) times daily. 01/09/24   [provider]  tamsulosin  (FLOMAX ) 0.4 MG CAPS capsule Take 1  capsule (0.4 mg total) by mouth daily. 02/03/24   McKenzie, Belvie CROME, MD  traMADol (ULTRAM) 50 MG tablet Take 50 mg by mouth 3 (three) times daily as needed. 01/23/24   [provider]  triamcinolone  cream (KENALOG ) 0.1 % Apply 1 Application topically 2 (two) times daily. 01/17/24   [provider]    Allergies: Dilaudid [hydromorphone hcl]    Review of Systems  Constitutional:  Positive for fever.    Updated Vital Signs BP 125/75 (BP Location: Right Arm)   Pulse 90   Temp 98.9 F (37.2 C) (Oral)   Resp (!) 21   Ht 5' 11 (1.803 m)   Wt 108.9 kg    SpO2 94%   BMI 33.47 kg/m   Physical Exam Vitals and nursing note reviewed.  Constitutional:      General: He is not in acute distress.    Appearance: He is well-developed.  HENT:     Head: Normocephalic and atraumatic.  Eyes:     Extraocular Movements: Extraocular movements intact.     Conjunctiva/sclera: Conjunctivae normal.     Pupils: Pupils are equal, round, and reactive to light.  Cardiovascular:     Rate and Rhythm: Normal rate and regular rhythm.     Heart sounds: No murmur heard. Pulmonary:     Effort: Pulmonary effort is normal. No respiratory distress.     Breath sounds: Normal breath sounds.  Abdominal:     Palpations: Abdomen is soft.     Tenderness: There is no abdominal tenderness.  Musculoskeletal:        General: No swelling.     Cervical back: Neck supple.  Skin:    General: Skin is warm and dry.     Capillary Refill: Capillary refill takes less than 2 seconds.  Neurological:     General: No focal deficit present.     Mental Status: He is alert and oriented to person, place, and time.  Psychiatric:        Mood and Affect: Mood normal.     (all labs ordered are listed, but only abnormal results are displayed) Labs Reviewed  LACTIC ACID, PLASMA - Abnormal; Notable for the following components:      Result Value   Lactic Acid, Venous 3.0 (*)    All other components within normal limits  COMPREHENSIVE METABOLIC PANEL WITH GFR - Abnormal; Notable for the following components:   Sodium 134 (*)    CO2 19 (*)    Glucose, Bld 190 (*)    BUN 38 (*)    Creatinine, Ser 3.61 (*)    GFR, Estimated 16 (*)    Anion gap 17 (*)    All other components within normal limits  CBC WITH DIFFERENTIAL/PLATELET - Abnormal; Notable for the following components:   WBC 25.2 (*)    RBC 3.79 (*)    Hemoglobin 11.8 (*)    HCT 34.7 (*)    Neutro Abs 22.9 (*)    Monocytes Absolute 1.4 (*)    Abs Immature Granulocytes 0.17 (*)    All other components within normal  limits  PROTIME-INR - Abnormal; Notable for the following components:   Prothrombin Time 15.9 (*)    All other components within normal limits  URINALYSIS, W/ REFLEX TO CULTURE (INFECTION SUSPECTED) - Abnormal; Notable for the following components:   Color, Urine AMBER (*)    Hgb urine dipstick SMALL (*)    Protein, ur 100 (*)    Nitrite POSITIVE (*)  All other components within normal limits  RESP PANEL BY RT-PCR (RSV, FLU A&B, COVID)  RVPGX2  CULTURE, BLOOD (ROUTINE X 2)  CULTURE, BLOOD (ROUTINE X 2)  URINE CULTURE  LACTIC ACID, PLASMA    EKG: EKG Interpretation Date/Time:  Sunday February 05 2024 15:07:05 EST Ventricular Rate:  104 PR Interval:  146 QRS Duration:  79 QT Interval:  334 QTC Calculation: 440 R Axis:   26  Text Interpretation: Sinus tachycardia Abnormal R-wave progression, early transition Minimal ST depression, anterolateral leads increased rate from prior 10/25 Confirmed by Towana Sharper [phone]) on 02/05/2024 3:14:57 PM  Radiology: CT ABDOMEN PELVIS WO CONTRAST Result Date: 02/05/2024 CLINICAL DATA:  Kidney infection and is on antibiotics, presenting with fever. EXAM: CT ABDOMEN AND PELVIS WITHOUT CONTRAST TECHNIQUE: Multidetector CT imaging of the abdomen and pelvis was performed following the standard protocol without IV contrast. RADIATION DOSE REDUCTION: This exam was performed according to the departmental dose-optimization program which includes automated exposure control, adjustment of the mA and/or kV according to patient size and/or use of iterative reconstruction technique. COMPARISON:  October 30, 2023 FINDINGS: Lower chest: Mild lingular and left basilar atelectasis is seen. Hepatobiliary: No focal liver abnormality is seen. No gallstones, gallbladder wall thickening, or biliary dilatation. Pancreas: Unremarkable. No pancreatic ductal dilatation or surrounding inflammatory changes. Spleen: Normal in size without focal abnormality. Adrenals/Urinary  Tract: Adrenal glands are unremarkable. Kidneys are normal size. Stable bilateral parenchymal and peripelvic renal cysts are seen. Multiple subcentimeter non-obstructing renal calculi of various sizes are seen within both kidneys. A 6 mm obstructing renal calculus is seen within the proximal to mid left ureter with mild left-sided hydronephrosis and hydroureter. There is mild to moderate severity diffuse urinary bladder wall thickening. Stomach/Bowel: Stomach is within normal limits. Appendix appears normal. No evidence of bowel wall thickening, distention, or inflammatory changes. Vascular/Lymphatic: Aortic atherosclerosis. No enlarged abdominal or pelvic lymph nodes. 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 Other: No abdominal wall hernia or abnormality. No abdominopelvic ascites. Musculoskeletal: Multilevel degenerative changes are seen throughout the lumbar spine. IMPRESSION: 1. 6 mm obstructing renal calculus within the proximal to mid left ureter. 2. Multiple bilateral subcentimeter non-obstructing renal calculi. 3. Stable bilateral parenchymal and peripelvic renal cysts. No follow-up imaging is recommended. This recommendation follows ACR consensus guidelines: Management of the Incidental Renal Mass on CT: A White Paper of the ACR Incidental Findings Committee. J Am Coll Radiol [phone]. 4. Mild to moderate severity diffuse urinary bladder wall thickening which may represent sequelae associated with cystitis. Correlation with urinalysis is recommended. 5. 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 6. Aortic atherosclerosis. Electronically Signed   By: Suzen Dials M.D.   On: 02/05/2024 17:01   DG Chest Port 1 View Result Date: 02/05/2024 CLINICAL DATA:  Possible sepsis.  Kidney infection on antibiotics. EXAM: PORTABLE CHEST 1 VIEW COMPARISON:  10/30/2023,  12/29/2022 FINDINGS: Lungs are hypoinflated with chronic lateral left base opacification. No acute airspace process or effusion. Cardiomediastinal silhouette and remainder of the exam is unchanged. IMPRESSION: Hypoinflation without acute cardiopulmonary disease. Electronically Signed   By: Toribio Agreste M.D.   On: 02/05/2024 15:07     .Critical Care  Performed by: Suellen Sherran LABOR, PA-C Authorized by: Suellen Sherran LABOR, PA-C   Critical care provider statement:    Critical care time (minutes):  30   Critical care was necessary to treat or prevent imminent or life-threatening deterioration  of the following conditions:  Sepsis   Critical care was time spent personally by me on the following activities:  Development of treatment plan with patient or surrogate, discussions with consultants, evaluation of patient's response to treatment, examination of patient, ordering and review of laboratory studies, ordering and review of radiographic studies, ordering and performing treatments and interventions, pulse oximetry, re-evaluation of patient's condition, review of old charts and obtaining history from patient or surrogate   Care discussed with comment:  Dr. Alvaro, urology    Medications Ordered in the ED  lactated ringers  infusion ( Intravenous New Bag/Given 02/05/24 1455)  lactated ringers  bolus 1,000 mL (0 mLs Intravenous Stopped 02/05/24 1652)  cefTRIAXone  (ROCEPHIN ) 2 g in sodium chloride  0.9 % 100 mL IVPB (0 g Intravenous Stopped 02/05/24 1542)    Clinical Course as of 02/05/24 1733  Sun Feb 05, 2024  5143 78 year old male with nausea vomiting.  Has elevated white count and elevated lactate.  CT showing obstructing stone on left and has signs of UTI.  Will review with urology and getting IV antibiotics.  Will need admission. [MB]    Clinical Course User Index [MB] Towana Ozell BROCKS, MD                                 Medical Decision Making This patient presents to the ED for concern of  fever and chills, this involves an extensive number of treatment options, and is a complaint that carries with it a high risk of complications and morbidity.  The differential diagnosis includes UTI, sepsis, pyelonephritis, kidney stone, AKI, electrolyte derangement, other   Co morbidities that complicate the patient evaluation :   Prostate cancer, kidney stone   Additional history obtained:  Additional history obtained from EMR External records from outside source obtained and reviewed including prior notes, labs, imaging   Lab Tests:  I Ordered, and personally interpreted labs.  The pertinent results include:    White blood cell count 25 with left shift and only bodies present, lactic acid 3, CO2 19, BUN 38, creatinine 3.61, anion gap 17 UA with positive nitrate, loaded 20 white blood cells COVID flu RSV negative  Imaging Studies ordered:  I ordered imaging studies including CT abdomen pelvis which shows right-sided hydronephrosis with a 6 mm ureteral stone and bladder wall thickening I independently visualized and interpreted imaging within scope of identifying emergent findings  I agree with the radiologist interpretation   Cardiac Monitoring: / EKG:  The patient was maintained on a cardiac monitor.  I personally viewed and interpreted the cardiac monitored which showed an underlying rhythm of: Sinus tachycardia   Consultations Obtained:  I requested consultation with the urologist Dr. Alvaro,  and discussed lab and imaging findings as well as pertinent plan - they recommend: IV antibiotics and transfer ED to ED to [facility] for ureteral stent Discussed with Dr. Ellouise in the ED at [facility] who will accept   Problem List / ED Course / Critical interventions / Medication management  Patient was recently treated for UTI with Macrobid but developed nausea vomiting early this morning, and then developed chills this afternoon.  He last had some ice cream and soda around  noon.  Presents with tachycardia, leukocytosis of 25.2, AKI and lactic acidosis.  CT shows left ureteral stone and UA does still have positive nitrate and 11-20 white blood cells/hpf.  Sent with sepsis due to infected kidney stone.  He was given IV fluids, denies any current pain.  He was not hypotensive.  Given 2 g of Rocephin .  Discussed with urology as above the patient we sent ED to ED to [facility]  Patient is agreeable with plan of care. I ordered medication including fluids for tachycardia, Rocephin  for UTI Reevaluation of the patient after these medicines showed that the patient improved I have reviewed the patients home medicines and have made adjustments as needed   Social Determinants of Health:  Lives with his wife  Amount and/or Complexity of Data Reviewed Labs: ordered. Radiology: ordered.  Risk Prescription drug management.        Final diagnoses:  Sepsis, due to unspecified organism, unspecified whether acute organ dysfunction present [facility])  Urinary tract infection with hematuria, site unspecified  Ureterolithiasis  AKI (acute kidney injury)    ED Discharge Orders     None          Suellen Sherran LABOR, PA-C 02/05/24 1733    Towana Ozell BROCKS, MD 02/06/24 1010

## 2024-02-05 NOTE — Op Note (Signed)
 Preoperative diagnosis: left obstructing ureteral stone Postoperative diagnosis: Same  Procedures performed: Cystoscopy, left retrograde pyelogram with interpretation, left ureteral stent placement  Surgeon: Dr. Steffan Pea  Findings:  8 French narrow bulbar urethral stricture dilated to 26 French Bladder with significant trabeculations otherwise no abnormalities. 6 x 26 double-J stent no strings in left ureter Dark cloudy urine appeared from ureter after stent placement 20 French council catheter 12 cc balloon at the end the case due to dilation.  Left retrograde pyelogram interpretation: Significantly distended pelvis, no filling defects  Specimens:left renal pelvis urine culture  Indication: Marcus Simpson is a 78 y.o. patient with history nephrolithiasis and prostate cancer he is post brachytherapy 2 months ago he presented the ER after significant nausea and vomiting was found to have an AKI and left obstructive pyelo with a mid left ureteral stone. After reviewing the management options for treatment, he elected to proceed with the above surgical procedure(s). We have discussed the potential benefits and risks of the procedure, side effects of the proposed treatment, the likelihood of the patient achieving the goals of the procedure, and any potential problems that might occur during the procedure or recuperation. Informed consent has been obtained.  Procedure in detail: Patient was consented prior to being brought back to the operating room. He was then brought back to the operating room placed on the table in supine position. General anesthesia was then induced and endotracheal tube inserted. This then placed in dorsolithotomy position and prepped and draped in the routine sterile fashion. A timeout was held.  Using a 22.5 French cystoscope with a 30  lens, I gently passed the scope into the patient's urethra upon reaching the bulbar urethra there was noted to be significant  stricture I passed this with a sensor wire.  Once sensor was confirmed in the bladder with fluoroscopy using Goodwin sounds the urethra was dilated from 16 to 105 French.  Once this was completed the scope was guided into the bladder under visual guidance. A 360 cystoscopic evaluation was performed with no mucosal abnormalities, no tumors, and no foreign bodies identified. I then passed a 0. 038 Sensor wire into the left ureteral orifice and into the left renal pelvis. I then slid a 5 French open-ended ureteral catheter over the wire and into the renal pelvis and removed the wire.   I then aspirated 10 cc of urine which was sent for culture. Next I injected 10 cc of contrast into the renal collecting system and performed a retrograde pyelogram. I then replaced the wire through the open-ended ureteral catheter and remove the catheter. I then slid a 26 cm x 6 French double-J ureteral stent over the wire and into the renal pelvis under fluoroscopic guidance. Once a nice curl was noted in the renal pelvis I advanced the distal end of the stent into the bladder before removing the wire completely. A final fluoroscopic image was obtained confirming the curl in the renal pelvis as well as a curl in the bladder.  A sensor wire was then replaced in the bladder and a 20 French council was placed over the wire 12 cc were inflated in the balloon.  Disposition: The patient returned to the PACU in stable condition.  Due to dilation catheter will need to remain in for 3 to 5 days prior to removal.

## 2024-02-05 NOTE — H&P (Signed)
 History and Physical    Marcus Simpson FMW:[id number] DOB: 10/15/1945 DOA: 02/05/2024  DOS: the patient was seen and examined on 02/05/2024  PCP: [name], MD   Patient coming from: Home  I have personally briefly reviewed patient's old medical records in [facility] and CareEverywhere  HPI:   Marcus Simpson is a 78 y.o. year old male with medical history of HTN, HLD, T2DM, prostate cancer, hx of ureteral stone presenting to the ED with nausea and vomiting after being started on treatment for UTI.   Pt states he was treated for UTI last week and his symptoms improved with treatment but he began having symptoms of nausea, vomiting when he woke up this morning and had fevers and chills as well.  He reports checking his temperature and it being over 101.  He states he has a urologist outpatient and received a call from them few days ago stating his urinalysis was positive for a UTI and he was started on another antibiotic.   On arrival to the ED patient was noted to be HDS stable. Lab work and imaging. CBC shows leukocytosis at 25k, hgb at 11.8 that is baseline. CMP shows AKI with creatinine at 3.6, mild hyponatremia and slight AGMA. Lactic acid elevated at 3.0 which improved to 2.7. UA shows UTI and culture pending. CT shows findings consistent with cystitis and 6 mm obstructing stone. Urology consulted by EDP and pt underwent stent placement. [facility] contacted for admission.  Review of Systems: As mentioned in the history of present illness. All other systems reviewed and are negative.   Past Medical History:  Diagnosis Date   Arthritis    Bradycardia    asymptomatic    Diabetes mellitus without complication (HCC)    History of kidney stones    Hypertension    Prostate cancer [facility])     Past Surgical History:  Procedure Laterality Date   COLONOSCOPY     CYSTOSCOPY WITH STENT PLACEMENT Right 12/28/2022   Procedure: CYSTOSCOPY WITH STENT PLACEMENT;  Surgeon: Carolee Sherwood [name], MD;  Location: [facility] OR;  Service: Urology;  Laterality: Right;   EXTRACORPOREAL SHOCK WAVE LITHOTRIPSY Right 01/25/2023   Procedure: EXTRACORPOREAL SHOCK WAVE LITHOTRIPSY (ESWL);  Surgeon: [name], MD;  Location: [facility] ORS;  Service: Urology;  Laterality: Right;   FLEXIBLE SIGMOIDOSCOPY N/A 04/02/2015   Procedure: FLEXIBLE SIGMOIDOSCOPY;  Surgeon: Claudis RAYMOND Rivet, MD;  Location: [facility] ENDO SUITE;  Service: Endoscopy;  Laterality: N/A;  1:45 - moved to 1/11 @ 8:30 - Ann to notify   KIDNEY STONE SURGERY     RADIOACTIVE SEED IMPLANT N/A 12/29/2023   Procedure: INSERTION, RADIATION SOURCE, PROSTATE;  Surgeon: Sherrilee Belvie CROME, MD;  Location: [facility] ORS;  Service: Urology;  Laterality: N/A;   SPACE OAR INSTILLATION N/A 12/29/2023   Procedure: INJECTION, HYDROGEL SPACER;  Surgeon: Sherrilee Belvie CROME, MD;  Location: [facility] ORS;  Service: Urology;  Laterality: N/A;   TOTAL KNEE ARTHROPLASTY Left 05/02/2017   Procedure: LEFT TOTAL KNEE ARTHROPLASTY;  Surgeon: Melodi Lerner, MD;  Location: [facility] ORS;  Service: Orthopedics;  Laterality: Left;     Allergies  Allergen Reactions   Dilaudid [Hydromorphone Hcl] Anaphylaxis and Other (See Comments)    Irregular heart rate    History reviewed. No pertinent family history.  Prior to Admission medications   Medication Sig Start Date End Date Taking? Authorizing Provider  aspirin  81 MG chewable tablet Chew 81 mg by mouth daily.   Yes [provider]  cyanocobalamin (VITAMIN B12) 1000 MCG tablet Take 1,000 mcg by mouth daily.   Yes [provider]  glipiZIDE (GLUCOTROL XL) 2.5 MG 24 hr tablet Take 2.5 mg by mouth daily. 11/26/22  Yes [provider]  indomethacin (INDOCIN) 25 MG capsule Take 25 mg by mouth 2 (two) times daily with a meal. 05/20/21  Yes [provider]  meclizine (ANTIVERT) 25 MG tablet Take 25 mg by mouth 3 (three) times daily as needed for dizziness.   Yes [provider]  metoprolol succinate  (TOPROL-XL) 25 MG 24 hr tablet Take 25 mg by mouth daily. 09/01/20  Yes [provider]  oxyCODONE -acetaminophen  (PERCOCET/ROXICET) 5-325 MG tablet Take 1 tablet by mouth every 6 (six) hours as needed for severe pain (pain score 7-10). 12/29/23  Yes McKenzie, Belvie CROME, MD  rosuvastatin  (CRESTOR ) 5 MG tablet Take 5 mg by mouth every Saturday.   Yes [provider]  silver sulfADIAZINE (SILVADENE) 1 % cream Apply 1 Application topically 2 (two) times daily. 01/09/24  Yes [provider]  tamsulosin  (FLOMAX ) 0.4 MG CAPS capsule Take 1 capsule (0.4 mg total) by mouth daily. 02/03/24  Yes McKenzie, Belvie CROME, MD  traMADol (ULTRAM) 50 MG tablet Take 50 mg by mouth 3 (three) times daily as needed for moderate pain (pain score 4-6). 01/23/24  Yes [provider]  triamcinolone  cream (KENALOG ) 0.1 % Apply 1 Application topically 2 (two) times daily. 01/17/24  Yes [provider]  ciprofloxacin  (CIPRO ) 500 MG tablet Take 500 mg by mouth 2 (two) times daily. Patient not taking: Reported on 02/05/2024 01/23/24   [provider]  nitrofurantoin, macrocrystal-monohydrate, (MACROBID) 100 MG capsule Take 1 capsule (100 mg total) by mouth every 12 (twelve) hours. Patient not taking: Reported on 02/05/2024 02/03/24   Sherrilee Belvie CROME, MD  [facility] ULTRA TEST test strip 1 each daily. 07/15/23   [provider]    Social History:  reports that he has quit smoking. His smoking use included cigarettes. He has never used smokeless tobacco. He reports that he does not drink alcohol and does not use drugs. Lives with wife Tobacco- Denies use. EtOH- Denies use.  Illicit drug use- denies use.  IADLs/ADLs- can perform independently at baseline    Physical Exam: Vitals:   02/05/24 2130 02/05/24 2145 02/05/24 2155 02/05/24 2206  BP: (!) 142/67 (!) 141/66  (!) 150/76  Pulse: 80 81 81 81  Resp: (!) 22 (!) 22 (!) 22 17  Temp:   (!) 97.3 F (36.3 C) 98.1 F  (36.7 C)  TempSrc:    Oral  SpO2: 97% 100% 97% 95%  Weight:    110.3 kg  Height:    5' 11 (1.803 m)     Physical Exam General: NAD HENT: NCAT Lungs: CTAB, no wheeze, rhonchi or rales.  Cardiovascular: Normal heart sounds Abdomen: No TTP, normal bowel sounds MSK: No asymmetry or muscle atrophy.  Skin: no lesions noted on exposed skin Neuro: Alert and oriented x4. CN grossly intact Psych: Normal mood and normal affect   Labs on Admission: I have personally reviewed following labs and imaging studies  CBC: Recent Labs  Lab 02/05/24 1443  WBC 25.2*  NEUTROABS 22.9*  HGB 11.8*  HCT 34.7*  MCV 91.6  PLT 257   Basic Metabolic Panel: Recent Labs  Lab 02/05/24 1443 02/05/24 2233  NA 134*  --   K 4.2  --   CL 98  --   CO2 19*  --  GLUCOSE 190*  --   BUN 38*  --   CREATININE 3.61*  --   CALCIUM  8.9  --   MG  --  1.8   GFR: Estimated Creatinine Clearance: 20.6 mL/min (A) (by C-G formula based on SCr of 3.61 mg/dL (H)). Liver Function Tests: Recent Labs  Lab 02/05/24 1443  AST 19  ALT 12  ALKPHOS 92  BILITOT 1.1  PROT 7.6  ALBUMIN 3.9   No results for input(s): LIPASE, AMYLASE in the last 168 hours. No results for input(s): AMMONIA in the last 168 hours. Coagulation Profile: Recent Labs  Lab 02/05/24 1443  INR 1.2   Cardiac Enzymes: No results for input(s): CKTOTAL, CKMB, CKMBINDEX, TROPONINI, TROPONINIHS in the last 168 hours. BNP (last 3 results) No results for input(s): BNP in the last 8760 hours. HbA1C: No results for input(s): HGBA1C in the last 72 hours. CBG: Recent Labs  Lab 02/05/24 1949 02/05/24 2101  GLUCAP 137* 131*   Lipid Profile: No results for input(s): CHOL, HDL, LDLCALC, TRIG, CHOLHDL, LDLDIRECT in the last 72 hours. Thyroid  Function Tests: No results for input(s): TSH, T4TOTAL, FREET4, T3FREE, THYROIDAB in the last 72 hours. Anemia Panel: No results for input(s): VITAMINB12,  FOLATE, FERRITIN, TIBC, IRON, RETICCTPCT in the last 72 hours. Urine analysis:    Component Value Date/Time   COLORURINE AMBER (A) 02/05/2024 1405   APPEARANCEUR CLEAR 02/05/2024 1405   APPEARANCEUR Clear 05/17/2023 1312   LABSPEC 1.019 02/05/2024 1405   PHURINE 5.0 02/05/2024 1405   GLUCOSEU NEGATIVE 02/05/2024 1405   HGBUR SMALL (A) 02/05/2024 1405   BILIRUBINUR NEGATIVE 02/05/2024 1405   BILIRUBINUR Negative 05/17/2023 1312   KETONESUR NEGATIVE 02/05/2024 1405   PROTEINUR 100 (A) 02/05/2024 1405   NITRITE POSITIVE (A) 02/05/2024 1405   LEUKOCYTESUR NEGATIVE 02/05/2024 1405    Radiological Exams on Admission: I have personally reviewed images DG C-Arm 1-60 Min-No Report Result Date: 02/05/2024 Fluoroscopy was utilized by the requesting physician.  No radiographic interpretation.   CT ABDOMEN PELVIS WO CONTRAST Result Date: 02/05/2024 CLINICAL DATA:  Kidney infection and is on antibiotics, presenting with fever. EXAM: CT ABDOMEN AND PELVIS WITHOUT CONTRAST TECHNIQUE: Multidetector CT imaging of the abdomen and pelvis was performed following the standard protocol without IV contrast. RADIATION DOSE REDUCTION: This exam was performed according to the departmental dose-optimization program which includes automated exposure control, adjustment of the mA and/or kV according to patient size and/or use of iterative reconstruction technique. COMPARISON:  October 30, 2023 FINDINGS: Lower chest: Mild lingular and left basilar atelectasis is seen. Hepatobiliary: No focal liver abnormality is seen. No gallstones, gallbladder wall thickening, or biliary dilatation. Pancreas: Unremarkable. No pancreatic ductal dilatation or surrounding inflammatory changes. Spleen: Normal in size without focal abnormality. Adrenals/Urinary Tract: Adrenal glands are unremarkable. Kidneys are normal size. Stable bilateral parenchymal and peripelvic renal cysts are seen. Multiple subcentimeter non-obstructing  renal calculi of various sizes are seen within both kidneys. A 6 mm obstructing renal calculus is seen within the proximal to mid left ureter with mild left-sided hydronephrosis and hydroureter. There is mild to moderate severity diffuse urinary bladder wall thickening. Stomach/Bowel: Stomach is within normal limits. Appendix appears normal. No evidence of bowel wall thickening, distention, or inflammatory changes. Vascular/Lymphatic: Aortic atherosclerosis. No enlarged abdominal or pelvic lymph nodes. 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 Other: No abdominal wall hernia or abnormality. No abdominopelvic ascites. Musculoskeletal: Multilevel degenerative changes are seen throughout the lumbar spine. IMPRESSION:  1. 6 mm obstructing renal calculus within the proximal to mid left ureter. 2. Multiple bilateral subcentimeter non-obstructing renal calculi. 3. Stable bilateral parenchymal and peripelvic renal cysts. No follow-up imaging is recommended. This recommendation follows ACR consensus guidelines: Management of the Incidental Renal Mass on CT: A White Paper of the ACR Incidental Findings Committee. J Am Coll Radiol [phone]. 4. Mild to moderate severity diffuse urinary bladder wall thickening which may represent sequelae associated with cystitis. Correlation with urinalysis is recommended. 5. 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 6. Aortic atherosclerosis. Electronically Signed   By: Suzen Dials M.D.   On: 02/05/2024 17:01   DG Chest Port 1 View Result Date: 02/05/2024 CLINICAL DATA:  Possible sepsis.  Kidney infection on antibiotics. EXAM: PORTABLE CHEST 1 VIEW COMPARISON:  10/30/2023, 12/29/2022 FINDINGS: Lungs are hypoinflated with chronic lateral left base opacification. No acute airspace process or effusion. Cardiomediastinal silhouette and remainder  of the exam is unchanged. IMPRESSION: Hypoinflation without acute cardiopulmonary disease. Electronically Signed   By: Toribio Agreste M.D.   On: 02/05/2024 15:07    EKG: My personal interpretation of EKG shows: sinus tachycardia without any acute ST changes. Abnormal R wave progression that has been present previously.     Assessment/Plan Principal Problem:   Acute kidney injury due to urinary tract obstruction Active Problems:   Hyperlipidemia   Essential hypertension   Prostate cancer (HCC)   Acute Kidney Injury and UTI secondary to obstructing ureteral stone. Pt meets sepsis criteria.  Pt with AKI/UTI 2/2 to obstructing ureteral stone. Baseline creatinine is 1.4-1.5. Pt status post stent stone removal and stent placement due to strictures. . Will give IVF and trend creatinine. Urology following, appreciate their assistance.  -IVF 125 cc/hr for 20 hours, heart function is normal.  -Follows cultues -Monitor electrolytes daily, creatinine daily. Daily renal panel ordered -Avoid nephrotoxic agents, renally dose meds, use heparin for VTE ppx.   Lactic acidosis: resolved with IVF  Chronic Problems: holding home meds until full med rec is complete. HTN: holding in setting of active infection and sepsis.  HLD: continue home med T2DM: monitor CBG Prostate Cancer: sees oncology and radiation oncology. Recently had seed implant.   VTE prophylaxis:  SQ Heparin  Diet: HH Code Status:  Full Code Telemetry:  Admission status: Inpatient, Progressive Patient is from: Home Anticipated d/c is to: Home Anticipated d/c is in: 3-4 days   Family Communication: Updated at bedside  Consults called: None   Severity of Illness: The appropriate patient status for this patient is INPATIENT. Inpatient status is judged to be reasonable and necessary in order to provide the required intensity of service to ensure the patient's safety. The patient's presenting symptoms, physical exam findings, and  initial radiographic and laboratory data in the context of their chronic comorbidities is felt to place them at high risk for further clinical deterioration. Furthermore, it is not anticipated that the patient will be medically stable for discharge from the hospital within 2 midnights of admission.   * I certify that at the point of admission it is my clinical judgment that the patient will require inpatient hospital care spanning beyond 2 midnights from the point of admission due to high intensity of service, high risk for further deterioration and high frequency of surveillance required.DEWAINE Morene Bathe, MD [facility] DEL. [facility]

## 2024-02-05 NOTE — ED Notes (Signed)
 Wife notified patient is being transported to [facility] ED

## 2024-02-05 NOTE — OR PostOp (Incomplete)
 PACU TO INPATIENT HANDOFF REPORT  Name/Age/Gender Marcus Simpson 78 y.o. male  Code Status    Code Status Orders  (From admission, onward)           Start     Ordered   02/05/24 2059  Full code  Continuous       Question:  By:  Answer:  Consent: discussion documented in EHR   02/05/24 2103           Code Status History     Date Active Date Inactive Code Status Order ID Comments User Context   12/28/2022 2227 12/30/2022 0249 Limited: Do not attempt resuscitation (DNR) -DNR-LIMITED -Do Not Intubate/DNI  [id number]  Keturah Carrier, MD ED   05/02/2017 1709 05/04/2017 1455 Full Code [id number]  Melodi Lerner, MD Inpatient       Home/SNF/Other {Discharge Destination:18313::Home}  Chief Complaint Ureterolithiasis [N20.1] AKI (acute kidney injury) [N17.9] Urinary tract infection with hematuria, site unspecified [N39.0, R31.9] Sepsis, due to unspecified organism, unspecified whether acute organ dysfunction present (HCC) [A41.9] Acute kidney injury due to urinary tract obstruction [N17.8]  Level of Care/Admitting Diagnosis ED Disposition     ED Disposition  Transfer via Transport   Condition  --   Comment  The patient appears reasonably stabilized for transfer considering the current resources, flow, and capabilities available in the ED at this time, and I doubt any other [facility] requiring further screening and/or treatment in the ED prior to transfer is p resent.          Medical History Past Medical History:  Diagnosis Date   Arthritis    Bradycardia    asymptomatic    Diabetes mellitus without complication (HCC)    History of kidney stones    Hypertension    Prostate cancer (HCC)     Allergies Allergies  Allergen Reactions   Dilaudid [Hydromorphone Hcl] Anaphylaxis and Other (See Comments)    Irregular heart rate    IV Location/Drains/Wounds Patient Lines/Drains/Airways Status     Active Line/Drains/Airways     Name Placement date Placement  time Site Days   Peripheral IV 02/05/24 20 G 1 Anterior;Proximal;Right Forearm 02/05/24  1435  Forearm  less than 1   Peripheral IV 02/05/24 20 G 1 Anterior;Left Forearm 02/05/24  1440  Forearm  less than 1   Urethral Catheter Dr. Shane 20 Fr. 02/05/24  --  --  less than 1   Ureteral Drain/Stent Right ureter 6 Fr. 12/28/22  2327  Right ureter  404   Ureteral Drain/Stent Left ureter 6 Fr. 02/05/24  2040  Left ureter  less than 1   Wound 02/05/24 2106 Surgical Closed Surgical Incision Penis Other (Comment) 02/05/24  2106  Penis  less than 1            Labs/Imaging Results for orders placed or performed during the hospital encounter of 02/05/24 (from the past 48 hours)  Resp panel by RT-PCR (RSV, Flu A&B, Covid) Anterior Nasal Swab     Status: None   Collection Time: 02/05/24  1:58 PM   Specimen: Anterior Nasal Swab  Result Value Ref Range   SARS Coronavirus 2 by RT PCR NEGATIVE NEGATIVE    Comment: (NOTE) SARS-CoV-2 target nucleic acids are NOT DETECTED.  The SARS-CoV-2 RNA is generally detectable in upper respiratory specimens during the acute phase of infection. The lowest concentration of SARS-CoV-2 viral copies this assay can detect is 138 copies/mL. A negative result does not preclude SARS-Cov-2 infection and should not be used  as the sole basis for treatment or other patient management decisions. A negative result may occur with  improper specimen collection/handling, submission of specimen other than nasopharyngeal swab, presence of viral mutation(s) within the areas targeted by this assay, and inadequate number of viral copies(<138 copies/mL). A negative result must be combined with clinical observations, patient history, and epidemiological information. The expected result is Negative.  Fact Sheet for Patients:  bloggercourse.com  Fact Sheet for Healthcare Providers:  seriousbroker.it  This test is no t yet  approved or cleared by the [place]  FDA and  has been authorized for detection and/or diagnosis of SARS-CoV-2 by FDA under an Emergency Use Authorization (EUA). This EUA will remain  in effect (meaning this test can be used) for the duration of the COVID-19 declaration under Section 564(b)(1) of the Act, 21 U.S.C.section 360bbb-3(b)(1), unless the authorization is terminated  or revoked sooner.       Influenza A by PCR NEGATIVE NEGATIVE   Influenza B by PCR NEGATIVE NEGATIVE    Comment: (NOTE) The Xpert Xpress SARS-CoV-2/FLU/RSV plus assay is intended as an aid in the diagnosis of influenza from Nasopharyngeal swab specimens and should not be used as a sole basis for treatment. Nasal washings and aspirates are unacceptable for Xpert Xpress SARS-CoV-2/FLU/RSV testing.  Fact Sheet for Patients: bloggercourse.com  Fact Sheet for Healthcare Providers: seriousbroker.it  This test is not yet approved or cleared by the [place]  FDA and has been authorized for detection and/or diagnosis of SARS-CoV-2 by FDA under an Emergency Use Authorization (EUA). This EUA will remain in effect (meaning this test can be used) for the duration of the COVID-19 declaration under Section 564(b)(1) of the Act, 21 U.S.C. section 360bbb-3(b)(1), unless the authorization is terminated or revoked.     Resp Syncytial Virus by PCR NEGATIVE NEGATIVE    Comment: (NOTE) Fact Sheet for Patients: bloggercourse.com  Fact Sheet for Healthcare Providers: seriousbroker.it  This test is not yet approved or cleared by the [place]  FDA and has been authorized for detection and/or diagnosis of SARS-CoV-2 by FDA under an Emergency Use Authorization (EUA). This EUA will remain in effect (meaning this test can be used) for the duration of the COVID-19 declaration under Section 564(b)(1) of the Act, 21  U.S.C. section 360bbb-3(b)(1), unless the authorization is terminated or revoked.  Performed at [facility], [address], [place], [place] [address]   Urinalysis, w/ Reflex to Culture (Infection Suspected) -Urine, Clean Catch     Status: Abnormal   Collection Time: 02/05/24  2:05 PM  Result Value Ref Range   Specimen Source URINE, CLEAN CATCH    Color, Urine AMBER (A) YELLOW    Comment: BIOCHEMICALS MAY BE AFFECTED BY COLOR   APPearance CLEAR CLEAR   Specific Gravity, Urine 1.019 1.005 - 1.030   pH 5.0 5.0 - 8.0   Glucose, UA NEGATIVE NEGATIVE mg/dL   Hgb urine dipstick SMALL (A) NEGATIVE   Bilirubin Urine NEGATIVE NEGATIVE   Ketones, ur NEGATIVE NEGATIVE mg/dL   Protein, ur 899 (A) NEGATIVE mg/dL   Nitrite POSITIVE (A) NEGATIVE   Leukocytes,Ua NEGATIVE NEGATIVE   RBC / HPF 0-5 0 - 5 RBC/hpf   WBC, UA 11-20 0 - 5 WBC/hpf    Comment:        Reflex urine culture not performed if WBC <=10, OR if Squamous epithelial cells >5. If Squamous epithelial cells >5 suggest recollection.    Bacteria, UA NONE SEEN NONE SEEN   Squamous Epithelial / HPF  0-5 0 - 5 /HPF   Mucus PRESENT     Comment: Performed at [facility], [address] [place], [place] [address]  Blood Culture (routine x 2)     Status: None (Preliminary result)   Collection Time: 02/05/24  2:09 PM   Specimen: BLOOD  Result Value Ref Range   Specimen Description BLOOD BLOOD RIGHT FOREARM    Special Requests      BOTTLES DRAWN AEROBIC AND ANAEROBIC Blood Culture adequate volume Performed at [facility], [address] [place], [place] [address]    Culture PENDING    Report Status PENDING   Lactic acid, plasma     Status: Abnormal   Collection Time: 02/05/24  2:43 PM  Result Value Ref Range   Lactic Acid, Venous 3.0 (HH) 0.5 - 1.9 mmol/L    Comment: Critical Value, Read Back and verified with [facility] AT 1515 ON [id number] BY S DALTON Performed at [facility], [address] [place], [place] [address]    Comprehensive metabolic panel     Status: Abnormal   Collection Time: 02/05/24  2:43 PM  Result Value Ref Range   Sodium 134 (L) 135 - 145 mmol/L   Potassium 4.2 3.5 - 5.1 mmol/L   Chloride 98 98 - 111 mmol/L   CO2 19 (L) 22 - 32 mmol/L   Glucose, Bld 190 (H) 70 - 99 mg/dL    Comment: Glucose reference range applies only to samples taken after fasting for at least 8 hours.   BUN 38 (H) 8 - 23 mg/dL   Creatinine, Ser 6.38 (H) 0.61 - 1.24 mg/dL   Calcium  8.9 8.9 - 10.3 mg/dL   Total Protein 7.6 6.5 - 8.1 g/dL   Albumin 3.9 3.5 - 5.0 g/dL   AST 19 15 - 41 U/L   ALT 12 0 - 44 U/L   Alkaline Phosphatase 92 38 - 126 U/L   Total Bilirubin 1.1 0.0 - 1.2 mg/dL   GFR, Estimated 16 (L) >60 mL/min    Comment: (NOTE) Calculated using the CKD-EPI Creatinine Equation (2021)    Anion gap 17 (H) 5 - 15    Comment: Performed at [facility], [address], [place], [place] [address]  CBC with Differential     Status: Abnormal   Collection Time: 02/05/24  2:43 PM  Result Value Ref Range   WBC 25.2 (H) 4.0 - 10.5 K/uL   RBC 3.79 (L) 4.22 - 5.81 MIL/uL   Hemoglobin 11.8 (L) 13.0 - 17.0 g/dL   HCT 65.2 (L) 60.9 - 47.9 %   MCV 91.6 80.0 - 100.0 fL   MCH 31.1 26.0 - 34.0 pg   MCHC 34.0 30.0 - 36.0 g/dL   RDW 86.7 88.4 - 84.4 %   Platelets 257 150 - 400 K/uL   nRBC 0.0 0.0 - 0.2 %   Neutrophils Relative % 91 %   Neutro Abs 22.9 (H) 1.7 - 7.7 K/uL   Lymphocytes Relative 3 %   Lymphs Abs 0.7 0.7 - 4.0 K/uL   Monocytes Relative 5 %   Monocytes Absolute 1.4 (H) 0.1 - 1.0 K/uL   Eosinophils Relative 0 %   Eosinophils Absolute 0.1 0.0 - 0.5 K/uL   Basophils Relative 0 %   Basophils Absolute 0.1 0.0 - 0.1 K/uL   RBC Morphology MORPHOLOGY UNREMARKABLE    Smear Review Normal platelet morphology    Immature Granulocytes 1 %   Abs Immature Granulocytes 0.17 (H) 0.00 - 0.07 K/uL  Dohle Bodies PRESENT     Comment: Performed at [facility], [address], [place], [place] [address]  Protime-INR      Status: Abnormal   Collection Time: 02/05/24  2:43 PM  Result Value Ref Range   Prothrombin Time 15.9 (H) 11.4 - 15.2 seconds   INR 1.2 0.8 - 1.2    Comment: (NOTE) INR goal varies based on device and disease states. Performed at [facility], [address], [place], [place] [address]   Blood Culture (routine x 2)     Status: None (Preliminary result)   Collection Time: 02/05/24  2:43 PM   Specimen: BLOOD  Result Value Ref Range   Specimen Description BLOOD BLOOD LEFT ARM    Special Requests      BOTTLES DRAWN AEROBIC AND ANAEROBIC Blood Culture adequate volume Performed at [facility], [address], [place], [place] [address]    Culture PENDING    Report Status PENDING   Lactic acid, plasma     Status: Abnormal   Collection Time: 02/05/24  4:53 PM  Result Value Ref Range   Lactic Acid, Venous 2.7 (HH) 0.5 - 1.9 mmol/L    Comment: Critical value noted. Value is consistent with previously reported and called value  Performed at [facility], [address], [place], [place] [address]   Glucose, capillary     Status: Abnormal   Collection Time: 02/05/24  7:49 PM  Result Value Ref Range   Glucose-Capillary 137 (H) 70 - 99 mg/dL    Comment: Glucose reference range applies only to samples taken after fasting for at least 8 hours.  Glucose, capillary     Status: Abnormal   Collection Time: 02/05/24  9:01 PM  Result Value Ref Range   Glucose-Capillary 131 (H) 70 - 99 mg/dL    Comment: Glucose reference range applies only to samples taken after fasting for at least 8 hours.   DG C-Arm 1-60 Min-No Report Result Date: 02/05/2024 Fluoroscopy was utilized by the requesting physician.  No radiographic interpretation.   CT ABDOMEN PELVIS WO CONTRAST Result Date: 02/05/2024 CLINICAL DATA:  Kidney infection and is on antibiotics, presenting with fever. EXAM: CT ABDOMEN AND PELVIS WITHOUT CONTRAST TECHNIQUE: Multidetector CT imaging of the abdomen and pelvis was performed following the  standard protocol without IV contrast. RADIATION DOSE REDUCTION: This exam was performed according to the departmental dose-optimization program which includes automated exposure control, adjustment of the mA and/or kV according to patient size and/or use of iterative reconstruction technique. COMPARISON:  October 30, 2023 FINDINGS: Lower chest: Mild lingular and left basilar atelectasis is seen. Hepatobiliary: No focal liver abnormality is seen. No gallstones, gallbladder wall thickening, or biliary dilatation. Pancreas: Unremarkable. No pancreatic ductal dilatation or surrounding inflammatory changes. Spleen: Normal in size without focal abnormality. Adrenals/Urinary Tract: Adrenal glands are unremarkable. Kidneys are normal size. Stable bilateral parenchymal and peripelvic renal cysts are seen. Multiple subcentimeter non-obstructing renal calculi of various sizes are seen within both kidneys. A 6 mm obstructing renal calculus is seen within the proximal to mid left ureter with mild left-sided hydronephrosis and hydroureter. There is mild to moderate severity diffuse urinary bladder wall thickening. Stomach/Bowel: Stomach is within normal limits. Appendix appears normal. No evidence of bowel wall thickening, distention, or inflammatory changes. Vascular/Lymphatic: Aortic atherosclerosis. No enlarged abdominal or pelvic lymph nodes. 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 Other: No abdominal wall hernia or abnormality. No abdominopelvic ascites. Musculoskeletal: Multilevel  degenerative changes are seen throughout the lumbar spine. IMPRESSION: 1. 6 mm obstructing renal calculus within the proximal to mid left ureter. 2. Multiple bilateral subcentimeter non-obstructing renal calculi. 3. Stable bilateral parenchymal and peripelvic renal cysts. No follow-up imaging is recommended. This recommendation follows ACR consensus guidelines:  Management of the Incidental Renal Mass on CT: A White Paper of the ACR Incidental Findings Committee. J Am Coll Radiol [phone]. 4. Mild to moderate severity diffuse urinary bladder wall thickening which may represent sequelae associated with cystitis. Correlation with urinalysis is recommended. 5. 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 6. Aortic atherosclerosis. Electronically Signed   By: Suzen Dials M.D.   On: 02/05/2024 17:01   DG Chest Port 1 View Result Date: 02/05/2024 CLINICAL DATA:  Possible sepsis.  Kidney infection on antibiotics. EXAM: PORTABLE CHEST 1 VIEW COMPARISON:  10/30/2023, 12/29/2022 FINDINGS: Lungs are hypoinflated with chronic lateral left base opacification. No acute airspace process or effusion. Cardiomediastinal silhouette and remainder of the exam is unchanged. IMPRESSION: Hypoinflation without acute cardiopulmonary disease. Electronically Signed   By: Toribio Agreste M.D.   On: 02/05/2024 15:07    Pending Labs   Vitals/Pain Today's Vitals   02/05/24 2100 02/05/24 2115 02/05/24 2130 02/05/24 2145  BP: 129/65 131/64 (!) 142/67 (!) 141/66  Pulse: 93 83 80 81  Resp: 18 (!) 27 (!) 22 (!) 22  Temp:      TempSrc:      SpO2: 90% 95% 97% 100%  Weight:      Height:      PainSc:  0-No pain  0-No pain    Isolation Precautions @ISOLATION @  Administered Medications Periop Administered Meds from 02/05/2024 1930 to 02/05/2024 2153       Date/Time Order Dose Route Action Action by Comments    02/05/2024 2046 EST 0.9 %  sodium chloride  infusion -- Intravenous Anesthesia Volume Adjustment Franchot Delon RAMAN, CRNA --    02/05/2024 2010 EST 0.9 %  sodium chloride  infusion -- Intravenous New Bag/Given Franchot Delon RAMAN, CRNA --    02/05/2024 2018 EST dexamethasone  (DECADRON ) injection 4 mg Intravenous Given Franchot Delon RAMAN, CRNA --    02/05/2024 2023 EST fentaNYL  (SUBLIMAZE ) injection 50 mcg  Intravenous Given Franchot Delon RAMAN, CRNA --    02/05/2024 2014 EST fentaNYL  (SUBLIMAZE ) injection 50 mcg Intravenous Given Franchot Delon RAMAN, CRNA --    02/05/2024 2015 EST iohexol  (OMNIPAQUE ) 300 MG/ML solution 50 mL Urethral Given Carin Darice BROCKS, RN used 68ml's    02/05/2024 2014 EST lidocaine  HCl (PF) (XYLOCAINE ) 2 % injection 40 mg Intradermal Given Franchot Delon RAMAN, CRNA --    02/05/2024 2041 EST ondansetron  (ZOFRAN ) injection 4 mg Intravenous Given Franchot Delon RAMAN, CRNA --    02/05/2024 2040 EST PHENYLephrine 80 mcg/ml in normal saline Adult IV Push Syringe (For Blood Pressure Support) 160 mcg Intravenous Given Franchot Delon RAMAN, CRNA --    02/05/2024 2027 EST PHENYLephrine 80 mcg/ml in normal saline Adult IV Push Syringe (For Blood Pressure Support) 80 mcg Intravenous Given Franchot Delon RAMAN, CRNA --    02/05/2024 2032 EST propofol  (DIPRIVAN ) 10 mg/mL bolus/IV push 50 mg Intravenous Given Franchot Delon RAMAN, CRNA --    02/05/2024 2014 EST propofol  (DIPRIVAN ) 10 mg/mL bolus/IV push 150 mg Intravenous Given Franchot Delon RAMAN, CRNA --    02/05/2024 2032 EST rocuronium  (ZEMURON ) injection 30 mg Intravenous Given Franchot Delon RAMAN, CRNA --    02/05/2024 2005 EST sterile water  for irrigation 3,000 mL  Irrigation Given Carin Darice BROCKS, RN --    02/05/2024 2014 EST succinylcholine  (ANECTINE ) syringe 160 mg Intravenous Given Franchot Delon RAMAN, CRNA --    02/05/2024 2046 EST sugammadex  sodium (BRIDION ) injection 150 mg Intravenous Given Franchot Delon RAMAN, CRNA --       Mobility {Mobility:[id number]}

## 2024-02-05 NOTE — Anesthesia Preprocedure Evaluation (Addendum)
 Anesthesia Evaluation  Patient identified by MRN, date of birth, ID band Patient awake    Reviewed: Allergy & Precautions, NPO status , Patient's Chart, lab work & pertinent test results, reviewed documented beta blocker date and time   History of Anesthesia Complications Negative for: history of anesthetic complications  Airway Mallampati: II  TM Distance: >3 FB Neck ROM: Full    Dental  (+) Edentulous Upper, Edentulous Lower, Upper Dentures   Pulmonary former smoker   breath sounds clear to auscultation       Cardiovascular hypertension, Pt. on medications and Pt. on home beta blockers (-) angina  Rhythm:Regular Rate:Normal     Neuro/Psych negative neurological ROS     GI/Hepatic Neg liver ROS,,,N/V with this stone   Endo/Other  diabetes (glu 190), Oral Hypoglycemic Agents  BMI 33.5  Renal/GU ARFRenal disease     Musculoskeletal  (+) Arthritis ,    Abdominal   Peds  Hematology Hb 11.8, plt 257k   Anesthesia Other Findings Prostate cancer  Reproductive/Obstetrics                              Anesthesia Physical Anesthesia Plan  ASA: 3 and emergent  Anesthesia Plan: General   Post-op Pain Management: Ofirmev  IV (intra-op)*   Induction: Intravenous and Rapid sequence  PONV Risk Score and Plan: 2 and Ondansetron  and Dexamethasone   Airway Management Planned: Oral ETT  Additional Equipment: None  Intra-op Plan:   Post-operative Plan: Extubation in OR  Informed Consent: I have reviewed the patients History and Physical, chart, labs and discussed the procedure including the risks, benefits and alternatives for the proposed anesthesia with the patient or authorized representative who has indicated his/her understanding and acceptance.       Plan Discussed with: CRNA and Surgeon  Anesthesia Plan Comments:          Anesthesia Quick Evaluation

## 2024-02-05 NOTE — Anesthesia Postprocedure Evaluation (Signed)
 Anesthesia Post Note  Patient: Marcus Simpson  Procedure(s) Performed: CYSTOSCOPY, WITH STENT INSERTION. URETHRAL DILATION (Left: Penis)     Patient location during evaluation: PACU Anesthesia Type: General Level of consciousness: awake and alert, patient cooperative and oriented Pain management: pain level controlled Vital Signs Assessment: post-procedure vital signs reviewed and stable Respiratory status: spontaneous breathing, nonlabored ventilation, respiratory function stable and patient connected to nasal cannula oxygen Cardiovascular status: blood pressure returned to baseline and stable Postop Assessment: no apparent nausea or vomiting Anesthetic complications: no   No notable events documented.  Last Vitals:  Vitals:   02/05/24 2130 02/05/24 2145  BP: (!) 142/67   Pulse: 80 81  Resp: (!) 22 (!) 22  Temp:    SpO2: 97% 100%    Last Pain:  Vitals:   02/05/24 2145  TempSrc:   PainSc: 0-No pain                 Attikus Bartoszek,E. Ianmichael Amescua

## 2024-02-06 ENCOUNTER — Encounter (HOSPITAL_COMMUNITY): Payer: Self-pay | Admitting: Urology

## 2024-02-06 DIAGNOSIS — N178 Other acute kidney failure: Secondary | ICD-10-CM | POA: Diagnosis not present

## 2024-02-06 LAB — CBC
HCT: 32.2 % — ABNORMAL LOW (ref 39.0–52.0)
Hemoglobin: 10.6 g/dL — ABNORMAL LOW (ref 13.0–17.0)
MCH: 31.1 pg (ref 26.0–34.0)
MCHC: 32.9 g/dL (ref 30.0–36.0)
MCV: 94.4 fL (ref 80.0–100.0)
Platelets: 241 K/uL (ref 150–400)
RBC: 3.41 MIL/uL — ABNORMAL LOW (ref 4.22–5.81)
RDW: 13.3 % (ref 11.5–15.5)
WBC: 21.8 K/uL — ABNORMAL HIGH (ref 4.0–10.5)
nRBC: 0 % (ref 0.0–0.2)

## 2024-02-06 LAB — BLOOD CULTURE ID PANEL (REFLEXED) - BCID2

## 2024-02-06 LAB — BASIC METABOLIC PANEL WITH GFR
Anion gap: 13 (ref 5–15)
BUN: 39 mg/dL — ABNORMAL HIGH (ref 8–23)
CO2: 21 mmol/L — ABNORMAL LOW (ref 22–32)
Calcium: 8.5 mg/dL — ABNORMAL LOW (ref 8.9–10.3)
Chloride: 101 mmol/L (ref 98–111)
Creatinine, Ser: 3.19 mg/dL — ABNORMAL HIGH (ref 0.61–1.24)
GFR, Estimated: 19 mL/min — ABNORMAL LOW (ref >60)
Glucose, Bld: 181 mg/dL — ABNORMAL HIGH (ref 70–99)
Potassium: 4.9 mmol/L (ref 3.5–5.1)
Sodium: 135 mmol/L (ref 135–145)

## 2024-02-06 LAB — GLUCOSE, CAPILLARY
Glucose-Capillary: 142 mg/dL — ABNORMAL HIGH (ref 70–99)
Glucose-Capillary: 161 mg/dL — ABNORMAL HIGH (ref 70–99)
Glucose-Capillary: 149 mg/dL — ABNORMAL HIGH (ref 70–99)
Glucose-Capillary: 175 mg/dL — ABNORMAL HIGH (ref 70–99)

## 2024-02-06 LAB — LACTIC ACID, PLASMA: Lactic Acid, Venous: 1.3 mmol/L (ref 0.5–1.9)

## 2024-02-06 MED ORDER — CHLORHEXIDINE GLUCONATE CLOTH 2 % EX PADS
6.0000 | MEDICATED_PAD | Freq: Every day | CUTANEOUS | Status: DC
  Administered 2024-02-06 – 2024-02-13 (×8): 6 via TOPICAL

## 2024-02-06 MED ORDER — LINEZOLID 600 MG/300ML IV SOLN
600.0000 mg | Freq: Two times a day (BID) | INTRAVENOUS | Status: DC
  Administered 2024-02-07 (×2): 600 mg via INTRAVENOUS
  Filled 2024-02-06 (×2): qty 300

## 2024-02-06 MED ORDER — LINEZOLID 600 MG/300ML IV SOLN
600.0000 mg | INTRAVENOUS | Status: AC
  Administered 2024-02-06: 600 mg via INTRAVENOUS
  Filled 2024-02-06: qty 300

## 2024-02-06 NOTE — Progress Notes (Signed)
 PROGRESS NOTE    Marcus Simpson  FMW:[id number] DOB: September 03, 1945 DOA: 02/05/2024 PCP: [name], MD   Brief Narrative:  78 year old male with history of hypertension, hyperlipidemia, diabetes mellitus type 2, prostate cancer, ureteral stone presented with nausea and vomiting after being started on treatment for UTI.  On presentation, WBC was 25.2, creatinine of 3.61, lactic acid of 3 which improved to 2.7.  UA suggestive of UTI.  CT showed findings suggestive of cystitis and 6 mm obstructing left ureteral stone stone.  Patient underwent cystoscopy and stent placement by urology.  Assessment & Plan:   Severe sepsis: Present on admission UTI/acute pyelonephritis: Present on admission Left ureteral stone -Presented with tachycardia, tachypnea, leukocytosis, lactic acidosis and UTI with obstructing left ureteral stone -underwent cystoscopy and left ureteral stent placement by urology on 02/05/2024. - Continue Rocephin .  Follow cultures  Leukocytosis - Improving but still significantly elevated.  Monitor  AKI Acute metabolic acidosis - Presented with creatinine of 3.61.  Creatinine improving to 3.19 today.  Monitor.  Continue IV fluids  Lactic acidosis: Resolved  Hypertension - Monitor blood pressure.  Antihypertensives on hold for now.  Hyperlipidemia -Continue statin  Prostate cancer - Sees oncology and radiation oncology as an outpatient.  Continue tamsulosin   Diabetes mellitus type 2 - Monitor CBGs.  Start SSI if elevated.  Obesity class I - Outpatient follow-up  DVT prophylaxis: Subcutaneous heparin Code Status: Full Family Communication: None at bedside Disposition Plan: Status is: Inpatient Remains inpatient appropriate because: Of severity of illness    Consultants: Urology  Procedures: As above  Antimicrobials: Rocephin  from 02/05/2024 onwards   Subjective: Patient seen and examined at bedside.  No fever, vomiting, worsening abdominal pain  reported.  Objective: Vitals:   02/05/24 2155 02/05/24 2206 02/06/24 0247 02/06/24 0500  BP:  (!) 150/76 (!) 146/76   Pulse: 81 81 69   Resp: (!) 22 17 19    Temp: (!) 97.3 F (36.3 C) 98.1 F (36.7 C) (!) 97.4 F (36.3 C)   TempSrc:  Oral Oral   SpO2: 97% 95% 95%   Weight:  110.3 kg  110.8 kg  Height:  5' 11 (1.803 m)      Intake/Output Summary (Last 24 hours) at 02/06/2024 0650 Last data filed at 02/06/2024 0514 Gross per 24 hour  Intake 3004.98 ml  Output 326 ml  Net 2678.98 ml   Filed Weights   02/05/24 1354 02/05/24 2206 02/06/24 0500  Weight: 108.9 kg 110.3 kg 110.8 kg    Examination:  General exam: Appears calm and comfortable  Respiratory system: Bilateral decreased breath sounds at bases Cardiovascular system: S1 & S2 heard, Rate controlled Gastrointestinal system: Abdomen is obese, nondistended, soft and nontender. Normal bowel sounds heard. Extremities: No cyanosis, clubbing, edema  Central nervous system: Alert and oriented. No focal neurological deficits. Moving extremities Skin: No rashes, lesions or ulcers Psychiatry: Judgement and insight appear normal. Mood & affect appropriate.     Data Reviewed: I have personally reviewed following labs and imaging studies  CBC: Recent Labs  Lab 02/05/24 1443 02/06/24 0045  WBC 25.2* 21.8*  NEUTROABS 22.9*  --   HGB 11.8* 10.6*  HCT 34.7* 32.2*  MCV 91.6 94.4  PLT 257 241   Basic Metabolic Panel: Recent Labs  Lab 02/05/24 1443 02/05/24 2233 02/06/24 0045  NA 134*  --  135  K 4.2  --  4.9  CL 98  --  101  CO2 19*  --  21*  GLUCOSE 190*  --  181*  BUN 38*  --  39*  CREATININE 3.61*  --  3.19*  CALCIUM  8.9  --  8.5*  MG  --  1.8  --    GFR: Estimated Creatinine Clearance: 23.4 mL/min (A) (by C-G formula based on SCr of 3.19 mg/dL (H)). Liver Function Tests: Recent Labs  Lab 02/05/24 1443  AST 19  ALT 12  ALKPHOS 92  BILITOT 1.1  PROT 7.6  ALBUMIN 3.9   No results for input(s):  LIPASE, AMYLASE in the last 168 hours. No results for input(s): AMMONIA in the last 168 hours. Coagulation Profile: Recent Labs  Lab 02/05/24 1443  INR 1.2   Cardiac Enzymes: No results for input(s): CKTOTAL, CKMB, CKMBINDEX, TROPONINI in the last 168 hours. BNP (last 3 results) No results for input(s): PROBNP in the last 8760 hours. HbA1C: No results for input(s): HGBA1C in the last 72 hours. CBG: Recent Labs  Lab 02/05/24 1949 02/05/24 2101 02/05/24 2342  GLUCAP 137* 131* 163*   Lipid Profile: No results for input(s): CHOL, HDL, LDLCALC, TRIG, CHOLHDL, LDLDIRECT in the last 72 hours. Thyroid  Function Tests: No results for input(s): TSH, T4TOTAL, FREET4, T3FREE, THYROIDAB in the last 72 hours. Anemia Panel: No results for input(s): VITAMINB12, FOLATE, FERRITIN, TIBC, IRON, RETICCTPCT in the last 72 hours. Sepsis Labs: Recent Labs  Lab 02/05/24 1443 02/05/24 1653 02/05/24 2233 02/06/24 0045  LATICACIDVEN 3.0* 2.7* 1.5 1.3    Recent Results (from the past 240 hours)  Resp panel by RT-PCR (RSV, Flu A&B, Covid) Anterior Nasal Swab     Status: None   Collection Time: 02/05/24  1:58 PM   Specimen: Anterior Nasal Swab  Result Value Ref Range Status   SARS Coronavirus 2 by RT PCR NEGATIVE NEGATIVE Final    Comment: (NOTE) SARS-CoV-2 target nucleic acids are NOT DETECTED.  The SARS-CoV-2 RNA is generally detectable in upper respiratory specimens during the acute phase of infection. The lowest concentration of SARS-CoV-2 viral copies this assay can detect is 138 copies/mL. A negative result does not preclude SARS-Cov-2 infection and should not be used as the sole basis for treatment or other patient management decisions. A negative result may occur with  improper specimen collection/handling, submission of specimen other than nasopharyngeal swab, presence of viral mutation(s) within the areas targeted by this assay, and  inadequate number of viral copies(<138 copies/mL). A negative result must be combined with clinical observations, patient history, and epidemiological information. The expected result is Negative.  Fact Sheet for Patients:  bloggercourse.com  Fact Sheet for Healthcare Providers:  seriousbroker.it  This test is no t yet approved or cleared by the [place]  FDA and  has been authorized for detection and/or diagnosis of SARS-CoV-2 by FDA under an Emergency Use Authorization (EUA). This EUA will remain  in effect (meaning this test can be used) for the duration of the COVID-19 declaration under Section 564(b)(1) of the Act, 21 U.S.C.section 360bbb-3(b)(1), unless the authorization is terminated  or revoked sooner.       Influenza A by PCR NEGATIVE NEGATIVE Final   Influenza B by PCR NEGATIVE NEGATIVE Final    Comment: (NOTE) The Xpert Xpress SARS-CoV-2/FLU/RSV plus assay is intended as an aid in the diagnosis of influenza from Nasopharyngeal swab specimens and should not be used as a sole basis for treatment. Nasal washings and aspirates are unacceptable for Xpert Xpress SARS-CoV-2/FLU/RSV testing.  Fact Sheet for Patients: bloggercourse.com  Fact Sheet for Healthcare Providers: seriousbroker.it  This test is not yet approved  or cleared by the [place]  FDA and has been authorized for detection and/or diagnosis of SARS-CoV-2 by FDA under an Emergency Use Authorization (EUA). This EUA will remain in effect (meaning this test can be used) for the duration of the COVID-19 declaration under Section 564(b)(1) of the Act, 21 U.S.C. section 360bbb-3(b)(1), unless the authorization is terminated or revoked.     Resp Syncytial Virus by PCR NEGATIVE NEGATIVE Final    Comment: (NOTE) Fact Sheet for Patients: bloggercourse.com  Fact Sheet for Healthcare  Providers: seriousbroker.it  This test is not yet approved or cleared by the [place]  FDA and has been authorized for detection and/or diagnosis of SARS-CoV-2 by FDA under an Emergency Use Authorization (EUA). This EUA will remain in effect (meaning this test can be used) for the duration of the COVID-19 declaration under Section 564(b)(1) of the Act, 21 U.S.C. section 360bbb-3(b)(1), unless the authorization is terminated or revoked.  Performed at [facility], [address], [place], [place] [address]   Blood Culture (routine x 2)     Status: None (Preliminary result)   Collection Time: 02/05/24  2:09 PM   Specimen: BLOOD  Result Value Ref Range Status   Specimen Description BLOOD BLOOD RIGHT FOREARM  Final   Special Requests   Final    BOTTLES DRAWN AEROBIC AND ANAEROBIC Blood Culture adequate volume Performed at [facility], [address] [place], [place] [address]    Culture PENDING  Incomplete   Report Status PENDING  Incomplete  Blood Culture (routine x 2)     Status: None (Preliminary result)   Collection Time: 02/05/24  2:43 PM   Specimen: BLOOD  Result Value Ref Range Status   Specimen Description BLOOD BLOOD LEFT ARM  Final   Special Requests   Final    BOTTLES DRAWN AEROBIC AND ANAEROBIC Blood Culture adequate volume Performed at [facility], [address], [place], [place] [address]    Culture PENDING  Incomplete   Report Status PENDING  Incomplete         Radiology Studies: DG C-Arm 1-60 Min-No Report Result Date: 02/05/2024 Fluoroscopy was utilized by the requesting physician.  No radiographic interpretation.   CT ABDOMEN PELVIS WO CONTRAST Result Date: 02/05/2024 CLINICAL DATA:  Kidney infection and is on antibiotics, presenting with fever. EXAM: CT ABDOMEN AND PELVIS WITHOUT CONTRAST TECHNIQUE: Multidetector CT imaging of the abdomen and pelvis was performed following the standard protocol without IV contrast. RADIATION  DOSE REDUCTION: This exam was performed according to the departmental dose-optimization program which includes automated exposure control, adjustment of the mA and/or kV according to patient size and/or use of iterative reconstruction technique. COMPARISON:  October 30, 2023 FINDINGS: Lower chest: Mild lingular and left basilar atelectasis is seen. Hepatobiliary: No focal liver abnormality is seen. No gallstones, gallbladder wall thickening, or biliary dilatation. Pancreas: Unremarkable. No pancreatic ductal dilatation or surrounding inflammatory changes. Spleen: Normal in size without focal abnormality. Adrenals/Urinary Tract: Adrenal glands are unremarkable. Kidneys are normal size. Stable bilateral parenchymal and peripelvic renal cysts are seen. Multiple subcentimeter non-obstructing renal calculi of various sizes are seen within both kidneys. A 6 mm obstructing renal calculus is seen within the proximal to mid left ureter with mild left-sided hydronephrosis and hydroureter. There is mild to moderate severity diffuse urinary bladder wall thickening. Stomach/Bowel: Stomach is within normal limits. Appendix appears normal. No evidence of bowel wall thickening, distention, or inflammatory changes. Vascular/Lymphatic: Aortic atherosclerosis. No enlarged abdominal or pelvic lymph nodes. Reproductive: Multiple prostate radiation  implantation seeds are seen within a mild to moderately enlarged prostate gland. Other: No abdominal wall hernia or abnormality. No abdominopelvic ascites. Musculoskeletal: Multilevel degenerative changes are seen throughout the lumbar spine. IMPRESSION: 1. 6 mm obstructing renal calculus within the proximal to mid left ureter. 2. Multiple bilateral subcentimeter non-obstructing renal calculi. 3. Stable bilateral parenchymal and peripelvic renal cysts. No follow-up imaging is recommended. This recommendation follows ACR consensus guidelines: Management of the Incidental Renal Mass on CT: A  White Paper of the ACR Incidental Findings Committee. J Am Coll Radiol [phone]. 4. Mild to moderate severity diffuse urinary bladder wall thickening which may represent sequelae associated with cystitis. Correlation with urinalysis is recommended. 5. 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 6. Aortic atherosclerosis. Electronically Signed   By: Suzen Dials M.D.   On: 02/05/2024 17:01   DG Chest Port 1 View Result Date: 02/05/2024 CLINICAL DATA:  Possible sepsis.  Kidney infection on antibiotics. EXAM: PORTABLE CHEST 1 VIEW COMPARISON:  10/30/2023, 12/29/2022 FINDINGS: Lungs are hypoinflated with chronic lateral left base opacification. No acute airspace process or effusion. Cardiomediastinal silhouette and remainder of the exam is unchanged. IMPRESSION: Hypoinflation without acute cardiopulmonary disease. Electronically Signed   By: Toribio Agreste M.D.   On: 02/05/2024 15:07        Scheduled Meds:  cyanocobalamin  1,000 mcg Oral Daily   heparin  5,000 Units Subcutaneous Q8H   [START ON 02/11/2024] rosuvastatin   5 mg Oral Q Sat   sodium chloride  flush  3 mL Intravenous Q12H   tamsulosin   0.4 mg Oral Daily   Continuous Infusions:  cefTRIAXone  (ROCEPHIN )  IV     lactated ringers  150 mL/hr at 02/06/24 0453          Sophie Mao, MD [facility] 02/06/2024, 6:50 AM

## 2024-02-06 NOTE — Progress Notes (Signed)
 1 Day Post-Op Subjective: Doing well feeling much better, WBC and CR improving.   Objective: Vital signs in last 24 hours: Temp:  [97.3 F (36.3 C)-99.7 F (37.6 C)] 97.4 F (36.3 C) (11/17 0247) Pulse Rate:  [69-113] 69 (11/17 0247) Resp:  [16-28] 19 (11/17 0247) BP: (101-150)/(58-103) 146/76 (11/17 0247) SpO2:  [90 %-100 %] 95 % (11/17 0247) Weight:  [108.9 kg-110.8 kg] 110.8 kg (11/17 0500)  Intake/Output from previous day: 11/16 0701 - 11/17 0700 In: 3005 [I.V.:1905; IV Piggyback:1100] Out: 326 [Urine:325; Blood:1] Intake/Output this shift: No intake/output data recorded.  Physical Exam:  General: Alert and oriented CV: RRR Lungs: Clear GU: catheter in place draining clear yellow urine.   Lab Results: Recent Labs    02/05/24 1443 02/06/24 0045  HGB 11.8* 10.6*  HCT 34.7* 32.2*   BMET Recent Labs    02/05/24 1443 02/06/24 0045  NA 134* 135  K 4.2 4.9  CL 98 101  CO2 19* 21*  GLUCOSE 190* 181*  BUN 38* 39*  CREATININE 3.61* 3.19*  CALCIUM  8.9 8.5*     Studies/Results: DG C-Arm 1-60 Min-No Report Result Date: 02/05/2024 Fluoroscopy was utilized by the requesting physician.  No radiographic interpretation.   CT ABDOMEN PELVIS WO CONTRAST Result Date: 02/05/2024 CLINICAL DATA:  Kidney infection and is on antibiotics, presenting with fever. EXAM: CT ABDOMEN AND PELVIS WITHOUT CONTRAST TECHNIQUE: Multidetector CT imaging of the abdomen and pelvis was performed following the standard protocol without IV contrast. RADIATION DOSE REDUCTION: This exam was performed according to the departmental dose-optimization program which includes automated exposure control, adjustment of the mA and/or kV according to patient size and/or use of iterative reconstruction technique. COMPARISON:  October 30, 2023 FINDINGS: Lower chest: Mild lingular and left basilar atelectasis is seen. Hepatobiliary: No focal liver abnormality is seen. No gallstones, gallbladder wall thickening,  or biliary dilatation. Pancreas: Unremarkable. No pancreatic ductal dilatation or surrounding inflammatory changes. Spleen: Normal in size without focal abnormality. Adrenals/Urinary Tract: Adrenal glands are unremarkable. Kidneys are normal size. Stable bilateral parenchymal and peripelvic renal cysts are seen. Multiple subcentimeter non-obstructing renal calculi of various sizes are seen within both kidneys. A 6 mm obstructing renal calculus is seen within the proximal to mid left ureter with mild left-sided hydronephrosis and hydroureter. There is mild to moderate severity diffuse urinary bladder wall thickening. Stomach/Bowel: Stomach is within normal limits. Appendix appears normal. No evidence of bowel wall thickening, distention, or inflammatory changes. Vascular/Lymphatic: Aortic atherosclerosis. No enlarged abdominal or pelvic lymph nodes. 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 Other: No abdominal wall hernia or abnormality. No abdominopelvic ascites. Musculoskeletal: Multilevel degenerative changes are seen throughout the lumbar spine. IMPRESSION: 1. 6 mm obstructing renal calculus within the proximal to mid left ureter. 2. Multiple bilateral subcentimeter non-obstructing renal calculi. 3. Stable bilateral parenchymal and peripelvic renal cysts. No follow-up imaging is recommended. This recommendation follows ACR consensus guidelines: Management of the Incidental Renal Mass on CT: A White Paper of the ACR Incidental Findings Committee. J Am Coll Radiol [phone]. 4. Mild to moderate severity diffuse urinary bladder wall thickening which may represent sequelae associated with cystitis. Correlation with urinalysis is recommended. 5. 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 6. Aortic atherosclerosis. Electronically Signed   By: Suzen Dials M.D.   On:  02/05/2024 17:01   DG Chest Port 1 View Result Date: 02/05/2024 CLINICAL DATA:  Possible sepsis.  Kidney  infection on antibiotics. EXAM: PORTABLE CHEST 1 VIEW COMPARISON:  10/30/2023, 12/29/2022 FINDINGS: Lungs are hypoinflated with chronic lateral left base opacification. No acute airspace process or effusion. Cardiomediastinal silhouette and remainder of the exam is unchanged. IMPRESSION: Hypoinflation without acute cardiopulmonary disease. Electronically Signed   By: Toribio Agreste M.D.   On: 02/05/2024 15:07    Assessment/Plan: # L obstructive pyelo - continue stent  - continue broach spectrum abx, L Renal pelvis Cx pending   # AKI - suspect post obstructive  - continue stent and catheter - continue to monitor creatinine   # Urethral stricture - s/p dilation  - continue catheter for 3-5 days.   DR. Sherrilee has been messaged to set up f/u URS.     LOS: 1 day   Jackey Pea MD 02/06/2024, 7:30 AM [name]

## 2024-02-06 NOTE — Progress Notes (Signed)
 PHARMACY - PHYSICIAN COMMUNICATION CRITICAL VALUE ALERT - BLOOD CULTURE IDENTIFICATION (BCID)  Marcus Simpson is an 78 y.o. male who presented to [facility] on 02/05/2024 with a chief complaint of urosepsis in setting of ureteral stone  Assessment:  4/4 BCx bottles growing MRSE (suspect urinary source)  Name of physician (or Provider) ContactedBETHA Mao  Current antibiotics: Linezolid  Changes to prescribed antibiotics recommended:  Patient is on recommended antibiotics - No changes needed ID will see patient  Results for orders placed or performed during the hospital encounter of 02/05/24  Blood Culture ID Panel (Reflexed) (Collected: 02/05/2024  2:43 PM)  Result Value Ref Range   Enterococcus faecalis NOT DETECTED NOT DETECTED   Enterococcus Faecium NOT DETECTED NOT DETECTED   Listeria monocytogenes NOT DETECTED NOT DETECTED   Staphylococcus species DETECTED (A) NOT DETECTED   Staphylococcus aureus (BCID) NOT DETECTED NOT DETECTED   Staphylococcus epidermidis DETECTED (A) NOT DETECTED   Staphylococcus lugdunensis NOT DETECTED NOT DETECTED   Streptococcus species NOT DETECTED NOT DETECTED   Streptococcus agalactiae NOT DETECTED NOT DETECTED   Streptococcus pneumoniae NOT DETECTED NOT DETECTED   Streptococcus pyogenes NOT DETECTED NOT DETECTED   A.calcoaceticus-baumannii NOT DETECTED NOT DETECTED   Bacteroides fragilis NOT DETECTED NOT DETECTED   Enterobacterales NOT DETECTED NOT DETECTED   Enterobacter cloacae complex NOT DETECTED NOT DETECTED   Escherichia coli NOT DETECTED NOT DETECTED   Klebsiella aerogenes NOT DETECTED NOT DETECTED   Klebsiella oxytoca NOT DETECTED NOT DETECTED   Klebsiella pneumoniae NOT DETECTED NOT DETECTED   Proteus species NOT DETECTED NOT DETECTED   Salmonella species NOT DETECTED NOT DETECTED   Serratia marcescens NOT DETECTED NOT DETECTED   Haemophilus influenzae NOT DETECTED NOT DETECTED   Neisseria meningitidis NOT DETECTED NOT DETECTED    Pseudomonas aeruginosa NOT DETECTED NOT DETECTED   Stenotrophomonas maltophilia NOT DETECTED NOT DETECTED   Candida albicans NOT DETECTED NOT DETECTED   Candida auris NOT DETECTED NOT DETECTED   Candida glabrata NOT DETECTED NOT DETECTED   Candida krusei NOT DETECTED NOT DETECTED   Candida parapsilosis NOT DETECTED NOT DETECTED   Candida tropicalis NOT DETECTED NOT DETECTED   Cryptococcus neoformans/gattii NOT DETECTED NOT DETECTED   Methicillin resistance mecA/C DETECTED (A) NOT DETECTED    [name] A 02/06/2024  4:55 PM

## 2024-02-06 NOTE — Progress Notes (Signed)
 Mobility Specialist - Progress Note   02/06/24 1523  Mobility  Activity Ambulated with assistance  Level of Assistance Contact guard assist, steadying assist  Assistive Device Other (Comment) (IV Pole)  Distance Ambulated (ft) 500 ft  Range of Motion/Exercises Active  Activity Response Tolerated well  Mobility visit 1 Mobility  Mobility Specialist Start Time (ACUTE ONLY) 1510  Mobility Specialist Stop Time (ACUTE ONLY) 1520  Mobility Specialist Time Calculation (min) (ACUTE ONLY) 10 min   Pt was found on recliner chair and agreeable to mobilize. A little unsteady going from STS but able to recover and become steady with progression. At EOS returned to sit EOB with all needs met. NT in room.   Erminio Leos,  Mobility Specialist Can be reached via Secure Chat

## 2024-02-06 NOTE — TOC Initial Note (Signed)
 Transition of Care [facility] : [facility]) - Initial/Assessment Note   Patient Details  Name: Marcus Simpson MRN: [id number] Date of Birth: 12/14/1945  Transition of Care [facility]) CM/SW Contact:    Duwaine GORMAN [name], LCSW Phone Number: 02/06/2024, 1:28 PM  Clinical Narrative: Patient is from home with spouse. PT consulted. Care management awaiting recommendations.  Expected Discharge Plan:  (TBD) Barriers to Discharge: Continued Medical Work up  Expected Discharge Plan and Services In-house Referral: Clinical Social Work Living arrangements for the past 2 months: Single Family Home             DME Arranged: N/A DME Agency: NA  Prior Living Arrangements/Services Living arrangements for the past 2 months: Single Family Home Lives with:: Spouse Patient language and need for interpreter reviewed:: Yes Do you feel safe going back to the place where you live?: Yes      Need for Family Participation in Patient Care: No (Comment) Care giver support system in place?: Yes (comment) Criminal Activity/Legal Involvement Pertinent to Current Situation/Hospitalization: No - Comment as needed  Activities of Daily Living ADL Screening (condition at time of admission) Independently performs ADLs?: Yes (appropriate for developmental age) Is the patient deaf or have difficulty hearing?: No Does the patient have difficulty seeing, even when wearing glasses/contacts?: No Does the patient have difficulty concentrating, remembering, or making decisions?: No  Emotional Assessment Orientation: : Oriented to Self, Oriented to Place, Oriented to  Time, Oriented to Situation Alcohol / Substance Use: Not Applicable Psych Involvement: No (comment)  Admission diagnosis:  Ureterolithiasis [N20.1] AKI (acute kidney injury) [N17.9] Urinary tract infection with hematuria, site unspecified [N39.0, R31.9] Sepsis, due to unspecified organism, unspecified whether acute organ dysfunction present (HCC) [A41.9] Acute kidney injury due  to urinary tract obstruction [N17.8] Patient Active Problem List   Diagnosis Date Noted   Acute kidney injury due to urinary tract obstruction 02/05/2024   Sepsis secondary to UTI (HCC) 12/29/2022   Acute encephalopathy 12/29/2022   Hydronephrosis with obstructing calculus 12/29/2022   Ureteral stone with hydronephrosis 12/28/2022   OA (osteoarthritis) of knee 05/02/2017   Diarrhea 03/10/2015   Prostate cancer (HCC) 03/10/2015   Hyperlipidemia 11/13/2008   Essential hypertension 11/13/2008   SYNCOPE AND COLLAPSE 11/13/2008   PCP:  [name], MD Pharmacy:   [facility] - [place], [place] - [address] [place] [address] Phone: [phone] Fax: [phone]  Social Drivers of Health (SDOH) Social History: SDOH Screenings   Food Insecurity: No Food Insecurity (02/05/2024)  Housing: Low Risk  (02/05/2024)  Transportation Needs: No Transportation Needs (02/05/2024)  Utilities: Not At Risk (02/05/2024)  Alcohol Screen: Low Risk  (10/11/2023)  Depression (PHQ2-9): Low Risk  (01/19/2024)  Social Connections: Moderately Isolated (02/05/2024)  Tobacco Use: Medium Risk (02/05/2024)   SDOH Interventions:    Readmission Risk Interventions     No data to display

## 2024-02-07 ENCOUNTER — Inpatient Hospital Stay (HOSPITAL_COMMUNITY)

## 2024-02-07 DIAGNOSIS — N390 Urinary tract infection, site not specified: Secondary | ICD-10-CM

## 2024-02-07 DIAGNOSIS — B957 Other staphylococcus as the cause of diseases classified elsewhere: Secondary | ICD-10-CM | POA: Diagnosis not present

## 2024-02-07 DIAGNOSIS — N178 Other acute kidney failure: Secondary | ICD-10-CM | POA: Diagnosis not present

## 2024-02-07 DIAGNOSIS — I38 Endocarditis, valve unspecified: Secondary | ICD-10-CM | POA: Diagnosis not present

## 2024-02-07 LAB — BASIC METABOLIC PANEL WITH GFR
Anion gap: 14 (ref 5–15)
BUN: 48 mg/dL — ABNORMAL HIGH (ref 8–23)
CO2: 22 mmol/L (ref 22–32)
Calcium: 9 mg/dL (ref 8.9–10.3)
Chloride: 97 mmol/L — ABNORMAL LOW (ref 98–111)
Creatinine, Ser: 2.5 mg/dL — ABNORMAL HIGH (ref 0.61–1.24)
GFR, Estimated: 25 mL/min — ABNORMAL LOW (ref >60)
Glucose, Bld: 136 mg/dL — ABNORMAL HIGH (ref 70–99)
Potassium: 4.4 mmol/L (ref 3.5–5.1)
Sodium: 133 mmol/L — ABNORMAL LOW (ref 135–145)

## 2024-02-07 LAB — CBC WITH DIFFERENTIAL/PLATELET
Abs Immature Granulocytes: 0.1 K/uL — ABNORMAL HIGH (ref 0.00–0.07)
Basophils Absolute: 0 K/uL (ref 0.0–0.1)
Basophils Relative: 0 %
Eosinophils Absolute: 0 K/uL (ref 0.0–0.5)
Eosinophils Relative: 0 %
HCT: 31.7 % — ABNORMAL LOW (ref 39.0–52.0)
Hemoglobin: 10.6 g/dL — ABNORMAL LOW (ref 13.0–17.0)
Immature Granulocytes: 1 %
Lymphocytes Relative: 10 %
Lymphs Abs: 1.7 K/uL (ref 0.7–4.0)
MCH: 30.5 pg (ref 26.0–34.0)
MCHC: 33.4 g/dL (ref 30.0–36.0)
MCV: 91.1 fL (ref 80.0–100.0)
Monocytes Absolute: 1.2 K/uL — ABNORMAL HIGH (ref 0.1–1.0)
Monocytes Relative: 7 %
Neutro Abs: 14.7 K/uL — ABNORMAL HIGH (ref 1.7–7.7)
Neutrophils Relative %: 82 %
Platelets: 294 K/uL (ref 150–400)
RBC: 3.48 MIL/uL — ABNORMAL LOW (ref 4.22–5.81)
RDW: 13.2 % (ref 11.5–15.5)
WBC: 17.7 K/uL — ABNORMAL HIGH (ref 4.0–10.5)
nRBC: 0 % (ref 0.0–0.2)

## 2024-02-07 LAB — MAGNESIUM: Magnesium: 2.1 mg/dL (ref 1.7–2.4)

## 2024-02-07 LAB — GLUCOSE, CAPILLARY
Glucose-Capillary: 114 mg/dL — ABNORMAL HIGH (ref 70–99)
Glucose-Capillary: 180 mg/dL — ABNORMAL HIGH (ref 70–99)
Glucose-Capillary: 132 mg/dL — ABNORMAL HIGH (ref 70–99)
Glucose-Capillary: 153 mg/dL — ABNORMAL HIGH (ref 70–99)

## 2024-02-07 LAB — URINE CULTURE

## 2024-02-07 LAB — ECHOCARDIOGRAM COMPLETE
Area-P 1/2: 3.38 cm2
S' Lateral: 2.8 cm

## 2024-02-07 MED ORDER — POLYETHYLENE GLYCOL 3350 17 G PO PACK
17.0000 g | PACK | Freq: Every day | ORAL | Status: DC | PRN
  Administered 2024-02-07: 17 g via ORAL
  Filled 2024-02-07: qty 1

## 2024-02-07 MED ORDER — INSULIN ASPART 100 UNIT/ML IJ SOLN
0.0000 [IU] | Freq: Three times a day (TID) | INTRAMUSCULAR | Status: DC
  Administered 2024-02-10: 1 [IU] via SUBCUTANEOUS
  Administered 2024-02-12: 2 [IU] via SUBCUTANEOUS
  Administered 2024-02-13 (×2): 1 [IU] via SUBCUTANEOUS
  Filled 2024-02-07 (×3): qty 1
  Filled 2024-02-07: qty 2
  Filled 2024-02-07: qty 1

## 2024-02-07 MED ORDER — PERFLUTREN LIPID MICROSPHERE
1.0000 mL | INTRAVENOUS | Status: AC | PRN
  Administered 2024-02-07: 2 mL via INTRAVENOUS

## 2024-02-07 MED ORDER — VANCOMYCIN VARIABLE DOSE PER UNSTABLE RENAL FUNCTION (PHARMACIST DOSING): Status: DC

## 2024-02-07 MED ORDER — BISACODYL 10 MG RE SUPP
10.0000 mg | Freq: Every day | RECTAL | Status: DC | PRN
Start: 2024-02-07 — End: 2024-02-13

## 2024-02-07 MED ORDER — SENNOSIDES-DOCUSATE SODIUM 8.6-50 MG PO TABS
1.0000 | ORAL_TABLET | Freq: Two times a day (BID) | ORAL | Status: DC
  Administered 2024-02-07 – 2024-02-13 (×5): 1 via ORAL
  Filled 2024-02-07 (×9): qty 1

## 2024-02-07 MED ORDER — VANCOMYCIN HCL 1500 MG/300ML IV SOLN
1500.0000 mg | Freq: Once | INTRAVENOUS | Status: AC
  Administered 2024-02-07: 1500 mg via INTRAVENOUS
  Filled 2024-02-07: qty 300

## 2024-02-07 MED ORDER — SODIUM CHLORIDE 0.9 % IV SOLN: INTRAVENOUS | Status: DC

## 2024-02-07 MED ORDER — INSULIN ASPART 100 UNIT/ML IJ SOLN
0.0000 [IU] | Freq: Every day | INTRAMUSCULAR | Status: DC
  Administered 2024-02-12: 2 [IU] via SUBCUTANEOUS
  Filled 2024-02-07: qty 2

## 2024-02-07 NOTE — Evaluation (Signed)
 Physical Therapy Evaluation Patient Details Name: Marcus Simpson MRN: [id number] DOB: 04-27-1945 Today's Date: 02/07/2024  History of Present Illness  78 yo male admitted with acute kidney injury, urinary tract obstruction. Hx of DM, prostate Ca, ureteral stone  Clinical Impression  On eval, pt was Supv level for mobility. He ambulated ~350 feet while maneuvering with IV pole. Tolerated distance well. Pt reports he has been ambulating-recommended he continue mobilizing with nursing and/or mobility team as able. Do not anticipate any f/u PT needs at this time. Will continue to follow pt during this hospital stay.         If plan is discharge home, recommend the following:     Can travel by private vehicle        Equipment Recommendations None recommended by PT  Recommendations for Other Services       Functional Status Assessment Patient has had a recent decline in their functional status and demonstrates the ability to make significant improvements in function in a reasonable and predictable amount of time.     Precautions / Restrictions Precautions Precautions: Fall Restrictions Weight Bearing Restrictions Per Provider Order: No      Mobility  Bed Mobility               General bed mobility comments: oob in recliner    Transfers Overall transfer level: Modified independent Equipment used: None                    Ambulation/Gait Ambulation/Gait assistance: Supervision Gait Distance (Feet): 350 Feet Assistive device: IV Pole Gait Pattern/deviations: Step-through pattern, Decreased stride length       General Gait Details: Mild intermittent unsteadiness but no overt LOB. Dyspnea 2/4-sats 95% on RA, HR 86 bpm. Tolerated distance well.  Stairs            Wheelchair Mobility     Tilt Bed    Modified Rankin (Stroke Patients Only)       Balance Overall balance assessment: Mild deficits observed, not formally tested                                            Pertinent Vitals/Pain Pain Assessment Pain Assessment: No/denies pain    Home Living Family/patient expects to be discharged to:: Private residence Living Arrangements: Spouse/significant other Available Help at Discharge: Family Type of Home: House Home Access: Stairs to enter   Secretary/administrator of Steps: 1   Home Layout: One level Home Equipment: Agricultural Consultant (2 wheels);Cane - single point      Prior Function Prior Level of Function : Independent/Modified Independent             Mobility Comments: ind ADLs Comments: mod ind     Extremity/Trunk Assessment   Upper Extremity Assessment Upper Extremity Assessment: Overall WFL for tasks assessed    Lower Extremity Assessment Lower Extremity Assessment: Generalized weakness    Cervical / Trunk Assessment Cervical / Trunk Assessment: Normal  Communication   Communication Communication: No apparent difficulties    Cognition Arousal: Alert Behavior During Therapy: WFL for tasks assessed/performed   PT - Cognitive impairments: No apparent impairments                         Following commands: Intact       Cueing Cueing Techniques: Verbal cues  General Comments      Exercises     Assessment/Plan    PT Assessment Patient needs continued PT services  PT Problem List Decreased mobility;Decreased strength       PT Treatment Interventions DME instruction;Gait training;Functional mobility training;Therapeutic activities;Patient/family education;Balance training    PT Goals (Current goals can be found in the Care Plan section)  Acute Rehab PT Goals Patient Stated Goal: home soon PT Goal Formulation: With patient Time For Goal Achievement: 02/21/24 Potential to Achieve Goals: Good    Frequency Min 3X/week     Co-evaluation               AM-PAC PT 6 Clicks Mobility  Outcome Measure Help needed turning from your back to your  side while in a flat bed without using bedrails?: None Help needed moving from lying on your back to sitting on the side of a flat bed without using bedrails?: None Help needed moving to and from a bed to a chair (including a wheelchair)?: None Help needed standing up from a chair using your arms (e.g., wheelchair or bedside chair)?: None Help needed to walk in hospital room?: A Little Help needed climbing 3-5 steps with a railing? : A Little 6 Click Score: 22    End of Session   Activity Tolerance: Patient tolerated treatment well Patient left: in chair;with call bell/phone within reach   PT Visit Diagnosis: Muscle weakness (generalized) (M62.81);Unsteadiness on feet (R26.81)    Time: 8770-8750 PT Time Calculation (min) (ACUTE ONLY): 20 min   Charges:   PT Evaluation $PT Eval Low Complexity: 1 Low   PT General Charges $$ ACUTE PT VISIT: 1 Visit            Dannial SQUIBB, PT Acute Rehabilitation  Office: [phone]

## 2024-02-07 NOTE — Consult Note (Signed)
 [facility]    Date of Admission:  02/05/2024   Total days of inpatient antibiotics 3        Reason for Consult: Blood Cx+MRSE    Principal Problem:   Acute kidney injury due to urinary tract obstruction Active Problems:   Hyperlipidemia   Essential hypertension   Prostate cancer [facility])   Assessment: 78 year old male with history of prostate cancer, hypertension hyperlipidemia, ureteral stones, being treated for UTI with Macrobid at home presenting with nausea vomiting found to have #UTI in the setting of ureteral stone status post stent placement - Patient afebrile on admission, WBC 25K.  Started on ceftriaxone .  CT abdomen pelvis showed left ureteral stone that was obstructive as well as urinary bladder thickening concerning for cystitis.  Urology engaged patient underwent cystoscopy with left ureteral stent placement.  Urine sent for cultures from the OR multiple species growing.  ID engaged given positive blood cultures #Blood cultures on admission 4 out of 4 bottles MRSE - Sensitivities pending from both sets to look for true bacteremia versus contamination -TTE no vegetation Recommendations:  -Continue vancomycin  and ceftriaxone .  Communicate with primary -Sens from 11/16 pending. If they match then will plan on TEE -Follow repeat blood Cx to ensure clearance(after ceftriaxone  and linezolid -If blood Cx do not match suspect clinical presentation 2/2 urinary tract infection. Of note he had been on macrobid for a few days prior to hospitalization  Microbiology:   Antibiotics:   Cultures: Blood 11/16 2/2 sets stpah epi 11/18 p Urine 11/16 multiple species Other   HPI: Marcus Simpson is a 78 y.o. male with past medical history seen for hypertension hyperlipidemia, diabetes, prostate cancer, ureteral stone presented to ED with nausea, vomiting he has been treated for UTI symptoms for about a week.  He had temp 101 at home.  On  arrival WBC 25K, patient afebrile.  Hewas initially started ceftriaxone , linezolid added given positive blood cultures with staph epi.  CT abdomen pelvis without contrast showed 6 mm obstructing renal calculus mid left ureter, stable.  Pelvic renal cysts, diffuse urinary bladder thickening..  ID engaged given blood cultures grew 2 out of 2 sets staph epi   Review of Systems: Review of Systems  All other systems reviewed and are negative.   Past Medical History:  Diagnosis Date   Arthritis    Bradycardia    asymptomatic    Diabetes mellitus without complication (HCC)    History of kidney stones    Hypertension    Prostate cancer (HCC)     Social History   Tobacco Use   Smoking status: Former    Types: Cigarettes   Smokeless tobacco: Never   Tobacco comments:    quit at age 74   Vaping Use   Vaping status: Never Used  Substance Use Topics   Alcohol use: No    Alcohol/week: 0.0 standard drinks of alcohol   Drug use: No    History reviewed. No pertinent family history. Scheduled Meds:  Chlorhexidine  Gluconate Cloth  6 each Topical Q0600   cyanocobalamin  1,000 mcg Oral Daily   heparin  5,000 Units Subcutaneous Q8H   insulin  aspart  0-5 Units Subcutaneous QHS   insulin  aspart  0-6 Units Subcutaneous TID WC   [START ON 02/11/2024] rosuvastatin   5 mg Oral Q Sat   senna-docusate  1 tablet Oral BID   sodium chloride  flush  3 mL Intravenous Q12H   tamsulosin   0.4 mg Oral Daily   vancomycin  variable dose per unstable renal function (pharmacist dosing)   Does not apply See admin instructions   Continuous Infusions:  sodium chloride  75 mL/hr at 02/07/24 1120   cefTRIAXone  (ROCEPHIN )  IV 2 g (02/07/24 1439)   PRN Meds:.acetaminophen  **OR** acetaminophen , bisacodyl , polyethylene glycol Allergies  Allergen Reactions   Dilaudid [Hydromorphone Hcl] Anaphylaxis and Other (See Comments)    Irregular heart rate    OBJECTIVE: Blood pressure 136/73, pulse (!) 59, temperature (!)  97.5 F (36.4 C), temperature source Oral, resp. rate 15, height 5' 11 (1.803 m), weight 113 kg, SpO2 97%.  Physical Exam Constitutional:      General: He is not in acute distress.    Appearance: He is normal weight. He is not toxic-appearing.  HENT:     Head: Normocephalic and atraumatic.     Right Ear: External ear normal.     Left Ear: External ear normal.     Nose: No congestion or rhinorrhea.     Mouth/Throat:     Mouth: Mucous membranes are moist.     Pharynx: Oropharynx is clear.  Eyes:     Extraocular Movements: Extraocular movements intact.     Conjunctiva/sclera: Conjunctivae normal.     Pupils: Pupils are equal, round, and reactive to light.  Cardiovascular:     Rate and Rhythm: Normal rate and regular rhythm.     Heart sounds: No murmur heard.    No friction rub. No gallop.  Pulmonary:     Effort: Pulmonary effort is normal.     Breath sounds: Normal breath sounds.  Abdominal:     General: Abdomen is flat. Bowel sounds are normal.     Palpations: Abdomen is soft.  Musculoskeletal:        General: No swelling. Normal range of motion.     Cervical back: Normal range of motion and neck supple.  Skin:    General: Skin is warm and dry.  Neurological:     General: No focal deficit present.     Mental Status: He is oriented to person, place, and time.  Psychiatric:        Mood and Affect: Mood normal.     Lab Results Lab Results  Component Value Date   WBC 17.7 (H) 02/07/2024   HGB 10.6 (L) 02/07/2024   HCT 31.7 (L) 02/07/2024   MCV 91.1 02/07/2024   PLT 294 02/07/2024    Lab Results  Component Value Date   CREATININE 2.50 (H) 02/07/2024   BUN 48 (H) 02/07/2024   NA 133 (L) 02/07/2024   K 4.4 02/07/2024   CL 97 (L) 02/07/2024   CO2 22 02/07/2024    Lab Results  Component Value Date   ALT 12 02/05/2024   AST 19 02/05/2024   ALKPHOS 92 02/05/2024   BILITOT 1.1 02/05/2024       Marcus Stank, MD [facility] 02/07/2024, 7:54 PM Evaluation of this patient requires complex antimicrobial therapy evaluation and counseling + isolation needs for disease transmission risk assessment and mitigation

## 2024-02-07 NOTE — Progress Notes (Signed)
  Echocardiogram 2D Echocardiogram has been performed.  Charmaine KANDICE Gaskins 02/07/2024, 11:31 AM

## 2024-02-07 NOTE — Progress Notes (Signed)
 Pharmacy Antibiotic Note  LEVIE WAGES is a 78 y.o. male admitted on 02/05/2024 with MRSE bacteremia.  Pharmacy has been consulted for Vancomycin  dosing.  Noted AKI on admission with SCr 3.61 now down to 2.5 (baseline appears to be ~1.5). Will load with Vancomycin  and check a random level in the AM along with SCr to re-dose.   Plan: - Vancomycin  1500 mg IV x 1 dose - Will monitor VR with AM labs and SCr trends - Will continue to follow renal function, culture results, LOT, and antibiotic de-escalation plans   Height: 5' 11 (180.3 cm) Weight: 113 kg (249 lb 1.9 oz) IBW/kg (Calculated) : 75.3  Temp (24hrs), Avg:97.6 F (36.4 C), Min:97.5 F (36.4 C), Max:97.7 F (36.5 C)  Recent Labs  Lab 02/05/24 1443 02/05/24 1653 02/05/24 2233 02/06/24 0045 02/07/24 0545  WBC 25.2*  --   --  21.8* 17.7*  CREATININE 3.61*  --   --  3.19* 2.50*  LATICACIDVEN 3.0* 2.7* 1.5 1.3  --     Estimated Creatinine Clearance: 30.1 mL/min (A) (by C-G formula based on SCr of 2.5 mg/dL (H)).    Allergies  Allergen Reactions   Dilaudid [Hydromorphone Hcl] Anaphylaxis and Other (See Comments)    Irregular heart rate    Antimicrobials this admission: Rocephin  11/16 >> Linezolid 11/17 >> 11/18 Vancomycin  11/18 >>  Dose adjustments this admission:   Microbiology results: 11/16 UCx x 2 >> mult species, needs recollect 11/16 BCx >> 4/4 staph epi (BCID MRSE) 11/18 BCx >>   Thank you for allowing pharmacy to be a part of this patient's care.  Almarie Lunger, PharmD, BCPS, BCIDP Infectious Diseases Clinical Pharmacist 02/07/2024 2:59 PM   **Pharmacist phone directory can now be found on [email or web address] ([email or web address]).  Listed under [facility].

## 2024-02-07 NOTE — Progress Notes (Signed)
 PROGRESS NOTE    Marcus Simpson  FMW:[id number] DOB: [birth year] DOA: 02/05/2024 PCP: [name], MD   Brief Narrative:  78 year old male with history of hypertension, hyperlipidemia, diabetes mellitus type 2, prostate cancer, ureteral stone presented with nausea and vomiting after being started on treatment for UTI.  On presentation, WBC was 25.2, creatinine of 3.61, lactic acid of 3 which improved to 2.7.  UA suggestive of UTI.  CT showed findings suggestive of cystitis and 6 mm obstructing left ureteral stone stone.  Patient underwent cystoscopy and stent placement by urology.  Assessment & Plan:   Severe sepsis: Present on admission UTI/acute pyelonephritis: Present on admission Left ureteral stone Staph epidermidis bacteremia -Presented with tachycardia, tachypnea, leukocytosis, lactic acidosis and UTI with obstructing left ureteral stone -underwent cystoscopy and left ureteral stent placement by urology on 02/05/2024.  Urology recommended to continue Foley catheter for 3 to 5 days and outpatient follow-up with urology. - Currently on Rocephin .  Blood cultures growing Staph epidermidis.  Follow susceptibility.  Patient was started on Zyvox as well on 02/06/2024.  ID evaluation pending.  Leukocytosis - Improving.  Monitor  AKI Acute metabolic acidosis - Presented with creatinine of 3.61.  Creatinine improving to 2.50 today.  Monitor.  Resume normal saline at 75 cc an hour  Lactic acidosis: Resolved  Hypertension - Monitor blood pressure.  Antihypertensives on hold for now.  Hyperlipidemia -Continue statin  Prostate cancer - Sees oncology and radiation oncology as an outpatient.  Continue tamsulosin   Anemia of chronic disease - From chronic illnesses.  Hemoglobin stable.  Monitor intermittently.  Diabetes mellitus type 2 - Monitor CBGs.  Start SSI if elevated.  Obesity class I - Outpatient follow-up  Physical deconditioning--PT eval  DVT prophylaxis:  Subcutaneous heparin Code Status: Full Family Communication: None at bedside Disposition Plan: Status is: Inpatient Remains inpatient appropriate because: Of severity of illness    Consultants: Urology.  ID  Procedures: As above  Antimicrobials: Rocephin  from 02/05/2024 onwards.  Zyvox from 02/06/2024 onwards   Subjective: Patient seen and examined at bedside.  Denies worsening abdominal pain, fever or vomiting.  Feels slightly better. Objective: Vitals:   02/06/24 1245 02/06/24 2011 02/07/24 0454 02/07/24 0500  BP: 136/67 119/70 127/67   Pulse: 67 64 (!) 59   Resp: 20 16 15    Temp: 97.6 F (36.4 C) (!) 97.5 F (36.4 C) 97.7 F (36.5 C)   TempSrc: Oral Oral Oral   SpO2: 98% 96% 96%   Weight:    113 kg  Height:        Intake/Output Summary (Last 24 hours) at 02/07/2024 0802 Last data filed at 02/07/2024 0730 Gross per 24 hour  Intake 1686.08 ml  Output 2900 ml  Net -1213.92 ml   Filed Weights   02/05/24 2206 02/06/24 0500 02/07/24 0500  Weight: 110.3 kg 110.8 kg 113 kg    Examination:  General: On room air.  No distress ENT/neck: No thyromegaly.  JVD is not elevated  respiratory: Decreased breath sounds at bases bilaterally with some crackles; no wheezing  CVS: S1-S2 heard, mild intermittent bradycardia present  abdominal: Soft, obese, nontender, slightly distended; no organomegaly, bowel sounds are heard Extremities: Trace lower extremity edema; no cyanosis  CNS: Awake and alert.  No focal neurologic deficit.  Moves extremities Lymph: No obvious lymphadenopathy Skin: No obvious ecchymosis/lesions  psych: Affect, judgment and mood are normal  musculoskeletal: No obvious joint swelling/deformity Genitourinary: Foley catheter present     Data Reviewed: I have personally  reviewed following labs and imaging studies  CBC: Recent Labs  Lab 02/05/24 1443 02/06/24 0045 02/07/24 0545  WBC 25.2* 21.8* 17.7*  NEUTROABS 22.9*  --  14.7*  HGB 11.8* 10.6*  10.6*  HCT 34.7* 32.2* 31.7*  MCV 91.6 94.4 91.1  PLT 257 241 294   Basic Metabolic Panel: Recent Labs  Lab 02/05/24 1443 02/05/24 2233 02/06/24 0045 02/07/24 0545  NA 134*  --  135 133*  K 4.2  --  4.9 4.4  CL 98  --  101 97*  CO2 19*  --  21* 22  GLUCOSE 190*  --  181* 136*  BUN 38*  --  39* 48*  CREATININE 3.61*  --  3.19* 2.50*  CALCIUM  8.9  --  8.5* 9.0  MG  --  1.8  --  2.1   GFR: Estimated Creatinine Clearance: 30.1 mL/min (A) (by C-G formula based on SCr of 2.5 mg/dL (H)). Liver Function Tests: Recent Labs  Lab 02/05/24 1443  AST 19  ALT 12  ALKPHOS 92  BILITOT 1.1  PROT 7.6  ALBUMIN 3.9   No results for input(s): LIPASE, AMYLASE in the last 168 hours. No results for input(s): AMMONIA in the last 168 hours. Coagulation Profile: Recent Labs  Lab 02/05/24 1443  INR 1.2   Cardiac Enzymes: No results for input(s): CKTOTAL, CKMB, CKMBINDEX, TROPONINI in the last 168 hours. BNP (last 3 results) No results for input(s): PROBNP in the last 8760 hours. HbA1C: No results for input(s): HGBA1C in the last 72 hours. CBG: Recent Labs  Lab 02/06/24 0730 02/06/24 1139 02/06/24 1611 02/06/24 2013 02/07/24 0740  GLUCAP 142* 161* 149* 175* 114*   Lipid Profile: No results for input(s): CHOL, HDL, LDLCALC, TRIG, CHOLHDL, LDLDIRECT in the last 72 hours. Thyroid  Function Tests: No results for input(s): TSH, T4TOTAL, FREET4, T3FREE, THYROIDAB in the last 72 hours. Anemia Panel: No results for input(s): VITAMINB12, FOLATE, FERRITIN, TIBC, IRON, RETICCTPCT in the last 72 hours. Sepsis Labs: Recent Labs  Lab 02/05/24 1443 02/05/24 1653 02/05/24 2233 02/06/24 0045  LATICACIDVEN 3.0* 2.7* 1.5 1.3    Recent Results (from the past 240 hours)  Resp panel by RT-PCR (RSV, Flu A&B, Covid) Anterior Nasal Swab     Status: None   Collection Time: 02/05/24  1:58 PM   Specimen: Anterior Nasal Swab  Result Value Ref  Range Status   SARS Coronavirus 2 by RT PCR NEGATIVE NEGATIVE Final    Comment: (NOTE) SARS-CoV-2 target nucleic acids are NOT DETECTED.  The SARS-CoV-2 RNA is generally detectable in upper respiratory specimens during the acute phase of infection. The lowest concentration of SARS-CoV-2 viral copies this assay can detect is 138 copies/mL. A negative result does not preclude SARS-Cov-2 infection and should not be used as the sole basis for treatment or other patient management decisions. A negative result may occur with  improper specimen collection/handling, submission of specimen other than nasopharyngeal swab, presence of viral mutation(s) within the areas targeted by this assay, and inadequate number of viral copies(<138 copies/mL). A negative result must be combined with clinical observations, patient history, and epidemiological information. The expected result is Negative.  Fact Sheet for Patients:  bloggercourse.com  Fact Sheet for Healthcare Providers:  seriousbroker.it  This test is no t yet approved or cleared by the [place]  FDA and  has been authorized for detection and/or diagnosis of SARS-CoV-2 by FDA under an Emergency Use Authorization (EUA). This EUA will remain  in effect (meaning this test can  be used) for the duration of the COVID-19 declaration under Section 564(b)(1) of the Act, 21 U.S.C.section 360bbb-3(b)(1), unless the authorization is terminated  or revoked sooner.       Influenza A by PCR NEGATIVE NEGATIVE Final   Influenza B by PCR NEGATIVE NEGATIVE Final    Comment: (NOTE) The Xpert Xpress SARS-CoV-2/FLU/RSV plus assay is intended as an aid in the diagnosis of influenza from Nasopharyngeal swab specimens and should not be used as a sole basis for treatment. Nasal washings and aspirates are unacceptable for Xpert Xpress SARS-CoV-2/FLU/RSV testing.  Fact Sheet for  Patients: bloggercourse.com  Fact Sheet for Healthcare Providers: seriousbroker.it  This test is not yet approved or cleared by the [place]  FDA and has been authorized for detection and/or diagnosis of SARS-CoV-2 by FDA under an Emergency Use Authorization (EUA). This EUA will remain in effect (meaning this test can be used) for the duration of the COVID-19 declaration under Section 564(b)(1) of the Act, 21 U.S.C. section 360bbb-3(b)(1), unless the authorization is terminated or revoked.     Resp Syncytial Virus by PCR NEGATIVE NEGATIVE Final    Comment: (NOTE) Fact Sheet for Patients: bloggercourse.com  Fact Sheet for Healthcare Providers: seriousbroker.it  This test is not yet approved or cleared by the [place]  FDA and has been authorized for detection and/or diagnosis of SARS-CoV-2 by FDA under an Emergency Use Authorization (EUA). This EUA will remain in effect (meaning this test can be used) for the duration of the COVID-19 declaration under Section 564(b)(1) of the Act, 21 U.S.C. section 360bbb-3(b)(1), unless the authorization is terminated or revoked.  Performed at [facility], [address] [place], [place] [address]   Urine Culture     Status: None (Preliminary result)   Collection Time: 02/05/24  2:05 PM   Specimen: Urine, Random  Result Value Ref Range Status   Specimen Description   Final    URINE, RANDOM Performed at [facility], [address], [place], [place] [address]    Special Requests   Final    NONE Reflexed from [id number] Performed at [facility], [address] [place], [place] [address]    Culture   Final    CULTURE REINCUBATED FOR BETTER GROWTH Performed at [facility], [address], [place], [place] [address]    Report Status PENDING  Incomplete  Blood Culture (routine x 2)     Status: None (Preliminary result)    Collection Time: 02/05/24  2:09 PM   Specimen: BLOOD  Result Value Ref Range Status   Specimen Description   Final    BLOOD BLOOD RIGHT FOREARM Performed at [facility], [address], [place], [place] [address]    Special Requests   Final    BOTTLES DRAWN AEROBIC AND ANAEROBIC Blood Culture adequate volume Performed at [facility], [address] [place], [place] [address]    Culture  Setup Time   Final    GRAM POSITIVE COCCI IN BOTH AEROBIC AND ANAEROBIC BOTTLES Gram Stain Report Called to,Read Back By and Verified With: L SAUNDERS AT [facility] AT 0812 ON [id number] BY S DALTON    Culture GRAM POSITIVE COCCI  Final   Report Status PENDING  Incomplete  Blood Culture (routine x 2)     Status: None (Preliminary result)   Collection Time: 02/05/24  2:43 PM   Specimen: BLOOD  Result Value Ref Range Status   Specimen Description   Final    BLOOD BLOOD LEFT ARM Performed at [facility], [address], [place], [place] [address]    Special Requests   Final    BOTTLES DRAWN AEROBIC AND ANAEROBIC Blood Culture adequate volume Performed at [facility], [address] [place], [place] [address]    Culture  Setup Time   Final    GRAM POSITIVE COCCI IN BOTH AEROBIC AND ANAEROBIC BOTTLES Gram Stain Report Called to,Read Back By and Verified With: L SAUNDERS AT [facility] AT 0812 ON [id number] BY S DALTON CRITICAL RESULT CALLED TO, READ BACK BY AND VERIFIED WITH: PHARMD DREW WOFFORD ON 02/06/24 @ 1517 BY DRT Performed at [facility], [address] [place], [place] [address]    Culture GRAM POSITIVE COCCI  Final   Report Status PENDING  Incomplete  Blood Culture ID Panel (Reflexed)     Status: Abnormal   Collection Time: 02/05/24  2:43 PM  Result Value Ref Range Status   Enterococcus faecalis NOT DETECTED NOT DETECTED Final   Enterococcus Faecium NOT DETECTED NOT DETECTED Final   Listeria monocytogenes NOT DETECTED NOT DETECTED Final   Staphylococcus species DETECTED (A) NOT DETECTED Final     Comment: CRITICAL RESULT CALLED TO, READ BACK BY AND VERIFIED WITH: PHARMD DREW WOFFORD ON 02/06/24 @ 1517 BY DRT    Staphylococcus aureus (BCID) NOT DETECTED NOT DETECTED Final   Staphylococcus epidermidis DETECTED (A) NOT DETECTED Final    Comment: Methicillin (oxacillin) resistant coagulase negative staphylococcus. Possible blood culture contaminant (unless isolated from more than one blood culture draw or clinical case suggests pathogenicity). No antibiotic treatment is indicated for blood  culture contaminants. CRITICAL RESULT CALLED TO, READ BACK BY AND VERIFIED WITH: PHARMD DREW WOFFORD ON 02/06/24 @ 1517 BY DRT    Staphylococcus lugdunensis NOT DETECTED NOT DETECTED Final   Streptococcus species NOT DETECTED NOT DETECTED Final   Streptococcus agalactiae NOT DETECTED NOT DETECTED Final   Streptococcus pneumoniae NOT DETECTED NOT DETECTED Final   Streptococcus pyogenes NOT DETECTED NOT DETECTED Final   A.calcoaceticus-baumannii NOT DETECTED NOT DETECTED Final   Bacteroides fragilis NOT DETECTED NOT DETECTED Final   Enterobacterales NOT DETECTED NOT DETECTED Final   Enterobacter cloacae complex NOT DETECTED NOT DETECTED Final   Escherichia coli NOT DETECTED NOT DETECTED Final   Klebsiella aerogenes NOT DETECTED NOT DETECTED Final   Klebsiella oxytoca NOT DETECTED NOT DETECTED Final   Klebsiella pneumoniae NOT DETECTED NOT DETECTED Final   Proteus species NOT DETECTED NOT DETECTED Final   Salmonella species NOT DETECTED NOT DETECTED Final   Serratia marcescens NOT DETECTED NOT DETECTED Final   Haemophilus influenzae NOT DETECTED NOT DETECTED Final   Neisseria meningitidis NOT DETECTED NOT DETECTED Final   Pseudomonas aeruginosa NOT DETECTED NOT DETECTED Final   Stenotrophomonas maltophilia NOT DETECTED NOT DETECTED Final   Candida albicans NOT DETECTED NOT DETECTED Final   Candida auris NOT DETECTED NOT DETECTED Final   Candida glabrata NOT DETECTED NOT DETECTED Final   Candida  krusei NOT DETECTED NOT DETECTED Final   Candida parapsilosis NOT DETECTED NOT DETECTED Final   Candida tropicalis NOT DETECTED NOT DETECTED Final   Cryptococcus neoformans/gattii NOT DETECTED NOT DETECTED Final   Methicillin resistance mecA/C DETECTED (A) NOT DETECTED Final    Comment: CRITICAL RESULT CALLED TO, READ BACK BY AND VERIFIED WITH: PHARMD DREW WOFFORD ON 02/06/24 @ 1517 BY DRT Performed at [facility], [address], [place], [place] [address]   Urine Culture     Status: None (Preliminary result)   Collection Time: 02/05/24  8:41 PM   Specimen: Urine, Cystoscope  Result Value Ref Range Status   Specimen Description   Final    URINE, RANDOM Performed at [facility], [address], [place], [place] [address]    Special Requests   Final    NONE Performed at [facility], [address], [place], [place] [address]    Culture   Final    CULTURE REINCUBATED FOR BETTER GROWTH Performed at [facility], [address], [place], [place] [address]    Report Status PENDING  Incomplete         Radiology Studies: DG C-Arm 1-60 Min-No Report Result Date: 02/05/2024 Fluoroscopy was utilized by the requesting physician.  No radiographic interpretation.   CT ABDOMEN PELVIS WO CONTRAST Result Date: 02/05/2024 CLINICAL DATA:  Kidney infection and is on antibiotics, presenting with fever. EXAM: CT ABDOMEN AND PELVIS WITHOUT CONTRAST TECHNIQUE: Multidetector CT imaging of the abdomen and pelvis was performed following the standard protocol without IV contrast. RADIATION DOSE REDUCTION: This exam was performed according to the departmental dose-optimization program which includes automated exposure control, adjustment of the mA and/or kV according to patient size and/or use of iterative reconstruction technique. COMPARISON:  October 30, 2023 FINDINGS: Lower chest: Mild lingular and left basilar atelectasis is seen. Hepatobiliary: No  focal liver abnormality is seen. No gallstones, gallbladder wall thickening, or biliary dilatation. Pancreas: Unremarkable. No pancreatic ductal dilatation or surrounding inflammatory changes. Spleen: Normal in size without focal abnormality. Adrenals/Urinary Tract: Adrenal glands are unremarkable. Kidneys are normal size. Stable bilateral parenchymal and peripelvic renal cysts are seen. Multiple subcentimeter non-obstructing renal calculi of various sizes are seen within both kidneys. A 6 mm obstructing renal calculus is seen within the proximal to mid left ureter with mild left-sided hydronephrosis and hydroureter. There is mild to moderate severity diffuse urinary bladder wall thickening. Stomach/Bowel: Stomach is within normal limits. Appendix appears normal. No evidence of bowel wall thickening, distention, or inflammatory changes. Vascular/Lymphatic: Aortic atherosclerosis. No enlarged abdominal or pelvic lymph nodes. 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 Other: No abdominal wall hernia or abnormality. No abdominopelvic ascites. Musculoskeletal: Multilevel degenerative changes are seen throughout the lumbar spine. IMPRESSION: 1. 6 mm obstructing renal calculus within the proximal to mid left ureter. 2. Multiple bilateral subcentimeter non-obstructing renal calculi. 3. Stable bilateral parenchymal and peripelvic renal cysts. No follow-up imaging is recommended. This recommendation follows ACR consensus guidelines: Management of the Incidental Renal Mass on CT: A White Paper of the ACR Incidental Findings Committee. J Am Coll Radiol [phone]. 4. Mild to moderate severity diffuse urinary bladder wall thickening which may represent sequelae associated with cystitis. Correlation with urinalysis is recommended. 5. 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 6. Aortic  atherosclerosis. Electronically Signed   By: Suzen Dials M.D.   On: 02/05/2024 17:01   DG Chest Port 1 View Result Date: 02/05/2024 CLINICAL DATA:  Possible sepsis.  Kidney infection on antibiotics. EXAM: PORTABLE CHEST 1 VIEW COMPARISON:  10/30/2023, 12/29/2022 FINDINGS: Lungs are hypoinflated with chronic lateral left base opacification. No acute airspace process or effusion. Cardiomediastinal silhouette and remainder of the exam is unchanged. IMPRESSION: Hypoinflation without acute cardiopulmonary disease. Electronically Signed   By: Toribio Agreste M.D.   On: 02/05/2024 15:07        Scheduled Meds:  Chlorhexidine  Gluconate Cloth  6 each Topical Q0600   cyanocobalamin  1,000  mcg Oral Daily   heparin  5,000 Units Subcutaneous Q8H   [START ON 02/11/2024] rosuvastatin   5 mg Oral Q Sat   sodium chloride  flush  3 mL Intravenous Q12H   tamsulosin   0.4 mg Oral Daily   Continuous Infusions:  cefTRIAXone  (ROCEPHIN )  IV 2 g (02/06/24 1526)   linezolid (ZYVOX) IV 300 mL/hr at 02/07/24 0302          Sophie Mao, MD [facility] 02/07/2024, 8:02 AM

## 2024-02-07 NOTE — Plan of Care (Signed)
  Problem: Clinical Measurements: Goal: Diagnostic test results will improve Outcome: Progressing Goal: Signs and symptoms of infection will decrease Outcome: Progressing   Problem: Respiratory: Goal: Ability to maintain adequate ventilation will improve Outcome: Progressing   Problem: Education: Goal: Knowledge of General Education information will improve Description: Including pain rating scale, medication(s)/side effects and non-pharmacologic comfort measures Outcome: Progressing

## 2024-02-07 NOTE — TOC Progression Note (Signed)
 Transition of Care [facility]) - Progression Note   Patient Details  Name: Marcus Simpson MRN: [id number] Date of Birth: 26-Aug-1945  Transition of Care [facility]) CM/SW Contact  Duwaine GORMAN [name], LCSW Phone Number: 02/07/2024, 2:15 PM  Clinical Narrative: PT evaluation did not recommend any PT follow up or DME. No care management needs identified at this time.  Expected Discharge Plan: Home/Self Care Barriers to Discharge: Continued Medical Work up  Expected Discharge Plan and Services In-house Referral: Clinical Social Work Post Acute Care Choice: NA Living arrangements for the past 2 months: Single Family Home           DME Arranged: N/A DME Agency: NA  Social Drivers of Health (SDOH) Interventions SDOH Screenings   Food Insecurity: No Food Insecurity (02/05/2024)  Housing: Low Risk  (02/05/2024)  Transportation Needs: No Transportation Needs (02/05/2024)  Utilities: Not At Risk (02/05/2024)  Alcohol Screen: Low Risk  (10/11/2023)  Depression (PHQ2-9): Low Risk  (01/19/2024)  Social Connections: Moderately Isolated (02/05/2024)  Tobacco Use: Medium Risk (02/05/2024)   Readmission Risk Interventions     No data to display

## 2024-02-07 NOTE — Progress Notes (Addendum)
 2 Days Post-Op Subjective: Doing well feeling better no N/V/F/C.   Objective: Vital signs in last 24 hours: Temp:  [97.5 F (36.4 C)-97.7 F (36.5 C)] 97.7 F (36.5 C) (11/18 0454) Pulse Rate:  [59-67] 59 (11/18 0454) Resp:  [15-20] 15 (11/18 0454) BP: (119-136)/(67-70) 127/67 (11/18 0454) SpO2:  [96 %-98 %] 96 % (11/18 0454) Weight:  [886 kg] 113 kg (11/18 0500)  Intake/Output from previous day: 11/17 0701 - 11/18 0700 In: 1686.1 [P.O.:1480; IV Piggyback:206.1] Out: 2550 [Urine:2550] Intake/Output this shift: No intake/output data recorded.  Physical Exam:  General: Alert and oriented CV: RRR Lungs: Clear GU: catheter in place draining clear yellow urine   Lab Results: Recent Labs    02/05/24 1443 02/06/24 0045  HGB 11.8* 10.6*  HCT 34.7* 32.2*   BMET Recent Labs    02/05/24 1443 02/06/24 0045  NA 134* 135  K 4.2 4.9  CL 98 101  CO2 19* 21*  GLUCOSE 190* 181*  BUN 38* 39*  CREATININE 3.61* 3.19*  CALCIUM  8.9 8.5*     Studies/Results: DG C-Arm 1-60 Min-No Report Result Date: 02/05/2024 Fluoroscopy was utilized by the requesting physician.  No radiographic interpretation.   CT ABDOMEN PELVIS WO CONTRAST Result Date: 02/05/2024 CLINICAL DATA:  Kidney infection and is on antibiotics, presenting with fever. EXAM: CT ABDOMEN AND PELVIS WITHOUT CONTRAST TECHNIQUE: Multidetector CT imaging of the abdomen and pelvis was performed following the standard protocol without IV contrast. RADIATION DOSE REDUCTION: This exam was performed according to the departmental dose-optimization program which includes automated exposure control, adjustment of the mA and/or kV according to patient size and/or use of iterative reconstruction technique. COMPARISON:  October 30, 2023 FINDINGS: Lower chest: Mild lingular and left basilar atelectasis is seen. Hepatobiliary: No focal liver abnormality is seen. No gallstones, gallbladder wall thickening, or biliary dilatation. Pancreas:  Unremarkable. No pancreatic ductal dilatation or surrounding inflammatory changes. Spleen: Normal in size without focal abnormality. Adrenals/Urinary Tract: Adrenal glands are unremarkable. Kidneys are normal size. Stable bilateral parenchymal and peripelvic renal cysts are seen. Multiple subcentimeter non-obstructing renal calculi of various sizes are seen within both kidneys. A 6 mm obstructing renal calculus is seen within the proximal to mid left ureter with mild left-sided hydronephrosis and hydroureter. There is mild to moderate severity diffuse urinary bladder wall thickening. Stomach/Bowel: Stomach is within normal limits. Appendix appears normal. No evidence of bowel wall thickening, distention, or inflammatory changes. Vascular/Lymphatic: Aortic atherosclerosis. No enlarged abdominal or pelvic lymph nodes. 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 Other: No abdominal wall hernia or abnormality. No abdominopelvic ascites. Musculoskeletal: Multilevel degenerative changes are seen throughout the lumbar spine. IMPRESSION: 1. 6 mm obstructing renal calculus within the proximal to mid left ureter. 2. Multiple bilateral subcentimeter non-obstructing renal calculi. 3. Stable bilateral parenchymal and peripelvic renal cysts. No follow-up imaging is recommended. This recommendation follows ACR consensus guidelines: Management of the Incidental Renal Mass on CT: A White Paper of the ACR Incidental Findings Committee. J Am Coll Radiol [phone]. 4. Mild to moderate severity diffuse urinary bladder wall thickening which may represent sequelae associated with cystitis. Correlation with urinalysis is recommended. 5. 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 6. Aortic atherosclerosis. Electronically Signed   By: Suzen Dials M.D.   On: 02/05/2024 17:01   DG Chest Port 1  View Result Date: 02/05/2024 CLINICAL DATA:  Possible sepsis.  Kidney infection on antibiotics. EXAM: PORTABLE  CHEST 1 VIEW COMPARISON:  10/30/2023, 12/29/2022 FINDINGS: Lungs are hypoinflated with chronic lateral left base opacification. No acute airspace process or effusion. Cardiomediastinal silhouette and remainder of the exam is unchanged. IMPRESSION: Hypoinflation without acute cardiopulmonary disease. Electronically Signed   By: Toribio Agreste M.D.   On: 02/05/2024 15:07    Assessment/Plan: # L obstructive pyelo - continue stent  - continue broach spectrum abx, L Renal pelvis Cx pending  - blood Cx gram + cocci results peniding  - Urine culture shows multiple species no recent cultures to evaluate resistances recommend transitioning to oral ABX with 24-hour observation to make sure patient does not worsen   # AKI - suspect post obstructive  - continue stent and catheter - continue to monitor creatinine    # Urethral stricture - s/p dilation  - continue catheter for 3-5 days.    DR. Sherrilee has been messaged to set up f/u URS.  Pt will need Void trial as outpatient if discharge prior to 3-5 days post surgery   Urology to follow peripherally   LOS: 2 days   Jackey Pea MD 02/07/2024, 7:22 AM [name]

## 2024-02-08 DIAGNOSIS — B957 Other staphylococcus as the cause of diseases classified elsewhere: Secondary | ICD-10-CM

## 2024-02-08 DIAGNOSIS — C61 Malignant neoplasm of prostate: Secondary | ICD-10-CM | POA: Diagnosis not present

## 2024-02-08 DIAGNOSIS — R7881 Bacteremia: Secondary | ICD-10-CM

## 2024-02-08 DIAGNOSIS — R600 Localized edema: Secondary | ICD-10-CM

## 2024-02-08 DIAGNOSIS — I10 Essential (primary) hypertension: Secondary | ICD-10-CM

## 2024-02-08 DIAGNOSIS — R6520 Severe sepsis without septic shock: Secondary | ICD-10-CM

## 2024-02-08 DIAGNOSIS — E785 Hyperlipidemia, unspecified: Secondary | ICD-10-CM

## 2024-02-08 DIAGNOSIS — A419 Sepsis, unspecified organism: Secondary | ICD-10-CM

## 2024-02-08 DIAGNOSIS — N390 Urinary tract infection, site not specified: Secondary | ICD-10-CM | POA: Diagnosis not present

## 2024-02-08 DIAGNOSIS — D638 Anemia in other chronic diseases classified elsewhere: Secondary | ICD-10-CM

## 2024-02-08 DIAGNOSIS — N178 Other acute kidney failure: Secondary | ICD-10-CM | POA: Diagnosis not present

## 2024-02-08 DIAGNOSIS — E119 Type 2 diabetes mellitus without complications: Secondary | ICD-10-CM

## 2024-02-08 DIAGNOSIS — N201 Calculus of ureter: Secondary | ICD-10-CM | POA: Diagnosis present

## 2024-02-08 DIAGNOSIS — N10 Acute tubulo-interstitial nephritis: Secondary | ICD-10-CM

## 2024-02-08 DIAGNOSIS — Z1629 Resistance to other single specified antibiotic: Secondary | ICD-10-CM

## 2024-02-08 LAB — GLUCOSE, CAPILLARY
Glucose-Capillary: 124 mg/dL — ABNORMAL HIGH (ref 70–99)
Glucose-Capillary: 112 mg/dL — ABNORMAL HIGH (ref 70–99)
Glucose-Capillary: 168 mg/dL — ABNORMAL HIGH (ref 70–99)
Glucose-Capillary: 114 mg/dL — ABNORMAL HIGH (ref 70–99)

## 2024-02-08 LAB — CBC WITH DIFFERENTIAL/PLATELET
Abs Immature Granulocytes: 0.07 K/uL (ref 0.00–0.07)
Basophils Absolute: 0 K/uL (ref 0.0–0.1)
Basophils Relative: 0 %
Eosinophils Absolute: 0.3 K/uL (ref 0.0–0.5)
Eosinophils Relative: 3 %
HCT: 33.5 % — ABNORMAL LOW (ref 39.0–52.0)
Hemoglobin: 11 g/dL — ABNORMAL LOW (ref 13.0–17.0)
Immature Granulocytes: 1 %
Lymphocytes Relative: 16 %
Lymphs Abs: 2.1 K/uL (ref 0.7–4.0)
MCH: 30.6 pg (ref 26.0–34.0)
MCHC: 32.8 g/dL (ref 30.0–36.0)
MCV: 93.1 fL (ref 80.0–100.0)
Monocytes Absolute: 1.2 K/uL — ABNORMAL HIGH (ref 0.1–1.0)
Monocytes Relative: 9 %
Neutro Abs: 9.3 K/uL — ABNORMAL HIGH (ref 1.7–7.7)
Neutrophils Relative %: 71 %
Platelets: 317 K/uL (ref 150–400)
RBC: 3.6 MIL/uL — ABNORMAL LOW (ref 4.22–5.81)
RDW: 13.3 % (ref 11.5–15.5)
WBC: 13 K/uL — ABNORMAL HIGH (ref 4.0–10.5)
nRBC: 0 % (ref 0.0–0.2)

## 2024-02-08 LAB — BASIC METABOLIC PANEL WITH GFR
Anion gap: 15 (ref 5–15)
BUN: 46 mg/dL — ABNORMAL HIGH (ref 8–23)
CO2: 19 mmol/L — ABNORMAL LOW (ref 22–32)
Calcium: 8.8 mg/dL — ABNORMAL LOW (ref 8.9–10.3)
Chloride: 101 mmol/L (ref 98–111)
Creatinine, Ser: 2.11 mg/dL — ABNORMAL HIGH (ref 0.61–1.24)
GFR, Estimated: 31 mL/min — ABNORMAL LOW (ref >60)
Glucose, Bld: 139 mg/dL — ABNORMAL HIGH (ref 70–99)
Potassium: 4 mmol/L (ref 3.5–5.1)
Sodium: 135 mmol/L (ref 135–145)

## 2024-02-08 LAB — VANCOMYCIN, RANDOM: Vancomycin Rm: 12 ug/mL

## 2024-02-08 LAB — CULTURE, BLOOD (ROUTINE X 2): Special Requests: ADEQUATE

## 2024-02-08 LAB — MAGNESIUM: Magnesium: 2.2 mg/dL (ref 1.7–2.4)

## 2024-02-08 MED ORDER — VANCOMYCIN HCL 750 MG/150ML IV SOLN
750.0000 mg | Freq: Two times a day (BID) | INTRAVENOUS | Status: DC
  Administered 2024-02-08 – 2024-02-09 (×3): 750 mg via INTRAVENOUS
  Filled 2024-02-08 (×3): qty 150

## 2024-02-08 MED ORDER — SODIUM BICARBONATE 650 MG PO TABS
650.0000 mg | ORAL_TABLET | Freq: Two times a day (BID) | ORAL | Status: AC
Start: 2024-02-08 — End: 2024-02-11
  Administered 2024-02-08 – 2024-02-11 (×6): 650 mg via ORAL
  Filled 2024-02-08 (×6): qty 1

## 2024-02-08 MED ORDER — MECLIZINE HCL 25 MG PO TABS: 25.0000 mg | ORAL_TABLET | Freq: Three times a day (TID) | ORAL | Status: DC | PRN

## 2024-02-08 MED ORDER — METOPROLOL SUCCINATE ER 50 MG PO TB24
25.0000 mg | ORAL_TABLET | Freq: Every day | ORAL | Status: DC
  Administered 2024-02-08 – 2024-02-13 (×6): 25 mg via ORAL
  Filled 2024-02-08 (×6): qty 1

## 2024-02-08 MED ORDER — TRIAMCINOLONE ACETONIDE 0.1 % EX CREA
1.0000 | TOPICAL_CREAM | Freq: Two times a day (BID) | CUTANEOUS | Status: DC
  Administered 2024-02-12 – 2024-02-13 (×2): 1 via TOPICAL
  Filled 2024-02-08 (×2): qty 15

## 2024-02-08 NOTE — Progress Notes (Signed)
 [facility]  Date of Admission:  02/05/2024   Total days of inpatient antibiotics 4  Principal Problem:   Acute kidney injury due to urinary tract obstruction Active Problems:   Hyperlipidemia   Essential hypertension   Prostate cancer [facility])          Assessment: 78 year old male with history of prostate cancer, hypertension hyperlipidemia, ureteral stones, being treated for UTI with Macrobid at home presenting with nausea vomiting found to have #UTI in the setting of ureteral stone status post stent placement - Patient afebrile on admission, WBC 25K.  Started on ceftriaxone .  CT abdomen pelvis showed left ureteral stone that was obstructive as well as urinary bladder thickening concerning for cystitis.  Urology engaged patient underwent cystoscopy with left ureteral stent placement.  Urine sent for cultures from the OR multiple species growing.  ID engaged given positive blood cultures #Blood cultures on admission 4 out of 4 bottles MRSE - Sensitivities pending from both sets to look for true bacteremia versus contamination -TTE no vegetation Recommendations:  -Continue vancomycin  - Discontinue ceftriaxone  - Reviewed sensitivities for MRSE, they match.  I recommended TEE to look for vegetation.  Patient declined states that he is 78 years old and does not want to do a TEE.  He is agreeable to empiric biotics for endocarditis course.  He understands that we might miss a larger vegetation. - PICC line when repeat blood culture on 11/18 no growth x 3 days. - Relayed to primary - Standard precaution   Microbiology:   Antibiotics: Ceftriaxone  9/16-18 Linezolid 11/17-18 Vancomycin  11/8-present Cultures: Blood 11/16 2/2 sets stpah epi 11/18 p Urine 11/16 multiple species SUBJECTIVE: Sitting in chair.  No new complaints. Interval:  Febrile overnight WBC 13K. Review of Systems: Review of Systems  All other systems reviewed and are  negative.    Scheduled Meds:  Chlorhexidine  Gluconate Cloth  6 each Topical Q0600   cyanocobalamin  1,000 mcg Oral Daily   heparin  5,000 Units Subcutaneous Q8H   insulin  aspart  0-5 Units Subcutaneous QHS   insulin  aspart  0-6 Units Subcutaneous TID WC   metoprolol succinate  25 mg Oral Daily   [START ON 02/11/2024] rosuvastatin   5 mg Oral Q Sat   senna-docusate  1 tablet Oral BID   sodium chloride  flush  3 mL Intravenous Q12H   tamsulosin   0.4 mg Oral Daily   triamcinolone  cream  1 Application Topical BID   Continuous Infusions:  sodium chloride  75 mL/hr at 02/08/24 1342   vancomycin  750 mg (02/08/24 0913)   PRN Meds:.acetaminophen  **OR** acetaminophen , bisacodyl , meclizine, polyethylene glycol Allergies  Allergen Reactions   Dilaudid [Hydromorphone Hcl] Anaphylaxis and Other (See Comments)    Irregular heart rate    OBJECTIVE: Vitals:   02/07/24 2058 02/08/24 0500 02/08/24 0546 02/08/24 1337  BP: (!) 146/73  (!) 155/68 134/68  Pulse: 63  68 73  Resp: 20  20 20   Temp: 97.7 F (36.5 C)  (!) 97.5 F (36.4 C) 98.2 F (36.8 C)  TempSrc: Oral  Oral Oral  SpO2: 98%  98% 97%  Weight:  112.9 kg    Height:       Body mass index is 34.71 kg/m.  Physical Exam Constitutional:      General: He is not in acute distress.    Appearance: He is normal weight. He is not toxic-appearing.  HENT:     Head: Normocephalic and atraumatic.  Right Ear: External ear normal.     Left Ear: External ear normal.     Nose: No congestion or rhinorrhea.     Mouth/Throat:     Mouth: Mucous membranes are moist.     Pharynx: Oropharynx is clear.  Eyes:     Extraocular Movements: Extraocular movements intact.     Conjunctiva/sclera: Conjunctivae normal.     Pupils: Pupils are equal, round, and reactive to light.  Cardiovascular:     Rate and Rhythm: Normal rate and regular rhythm.     Heart sounds: No murmur heard.    No friction rub. No gallop.  Pulmonary:     Effort: Pulmonary  effort is normal.     Breath sounds: Normal breath sounds.  Abdominal:     General: Abdomen is flat. Bowel sounds are normal.     Palpations: Abdomen is soft.  Musculoskeletal:        General: No swelling. Normal range of motion.     Cervical back: Normal range of motion and neck supple.  Skin:    General: Skin is warm and dry.  Neurological:     General: No focal deficit present.     Mental Status: He is oriented to person, place, and time.  Psychiatric:        Mood and Affect: Mood normal.       Lab Results Lab Results  Component Value Date   WBC 13.0 (H) 02/08/2024   HGB 11.0 (L) 02/08/2024   HCT 33.5 (L) 02/08/2024   MCV 93.1 02/08/2024   PLT 317 02/08/2024    Lab Results  Component Value Date   CREATININE 2.11 (H) 02/08/2024   BUN 46 (H) 02/08/2024   NA 135 02/08/2024   K 4.0 02/08/2024   CL 101 02/08/2024   CO2 19 (L) 02/08/2024    Lab Results  Component Value Date   ALT 12 02/05/2024   AST 19 02/05/2024   ALKPHOS 92 02/05/2024   BILITOT 1.1 02/05/2024        Loney Stank, MD [facility] 02/08/2024, 2:03 PM Evaluation of this patient requires complex antimicrobial therapy evaluation and counseling + isolation needs for disease transmission risk assessment and mitigation

## 2024-02-08 NOTE — Progress Notes (Signed)
 Pharmacy Antibiotic Note  Marcus Simpson is a 78 y.o. male admitted on 02/05/2024 with MRSE bacteremia.  Pharmacy has been consulted for Vancomycin  dosing.  VR this AM is 12 mcg/ml ~12 hours after the initial dosing, safe to re-dose. Estimated clearance is likely higher than measured in our AUC calculator.   Plan: - Start Vancomycin  750 mg IV every 12 hours (estimated VT~15 mcg/ml) - Will monitor renal improvement and make adjustments as necessary  Height: 5' 11 (180.3 cm) Weight: 112.9 kg (248 lb 14.4 oz) IBW/kg (Calculated) : 75.3  Temp (24hrs), Avg:97.6 F (36.4 C), Min:97.5 F (36.4 C), Max:97.7 F (36.5 C)  Recent Labs  Lab 02/05/24 1443 02/05/24 1653 02/05/24 2233 02/06/24 0045 02/07/24 0545 02/08/24 0537  WBC 25.2*  --   --  21.8* 17.7* 13.0*  CREATININE 3.61*  --   --  3.19* 2.50* 2.11*  LATICACIDVEN 3.0* 2.7* 1.5 1.3  --   --   VANCORANDOM  --   --   --   --   --  12    Estimated Creatinine Clearance: 35.7 mL/min (A) (by C-G formula based on SCr of 2.11 mg/dL (H)).    Allergies  Allergen Reactions   Dilaudid [Hydromorphone Hcl] Anaphylaxis and Other (See Comments)    Irregular heart rate    Antimicrobials this admission: Rocephin  11/16 >> Linezolid 11/17 >> 11/18 Vancomycin  11/18 >>  Dose adjustments this admission:   Microbiology results: 11/16 UCx x 2 >> mult species, needs recollect 11/16 BCx >> 4/4 staph epi (BCID MRSE) 11/18 BCx >>   Thank you for allowing pharmacy to be a part of this patient's care.  Almarie Lunger, PharmD, BCPS, BCIDP Infectious Diseases Clinical Pharmacist 02/08/2024 8:39 AM   **Pharmacist phone directory can now be found on [email or web address] ([email or web address]).  Listed under [facility].

## 2024-02-08 NOTE — Plan of Care (Signed)
  Problem: Respiratory: Goal: Ability to maintain adequate ventilation will improve Outcome: Progressing   Problem: Clinical Measurements: Goal: Diagnostic test results will improve Outcome: Progressing Goal: Signs and symptoms of infection will decrease Outcome: Progressing   Problem: Fluid Volume: Goal: Hemodynamic stability will improve Outcome: Progressing

## 2024-02-08 NOTE — Progress Notes (Signed)
 PROGRESS NOTE    Marcus Simpson  FMW:[id number] DOB: 04-12-1945 DOA: 02/05/2024 PCP: [name], MD    Chief Complaint  Patient presents with   Fever    Brief Narrative:  78 year old male with history of hypertension, hyperlipidemia, diabetes mellitus type 2, prostate cancer, ureteral stone presented with nausea and vomiting after being started on treatment for UTI.  On presentation, WBC was 25.2, creatinine of 3.61, lactic acid of 3 which improved to 2.7.  UA suggestive of UTI.  CT showed findings suggestive of cystitis and 6 mm obstructing left ureteral stone stone.  Patient underwent cystoscopy and stent placement by urology.     Assessment & Plan:   Principal Problem:   Acute kidney injury due to urinary tract obstruction Active Problems:   Hyperlipidemia   Essential hypertension   Prostate cancer (HCC)   Bacteremia due to methicillin resistant Staphylococcus epidermidis   Acute pyelonephritis   Left ureteral calculus   Anemia of chronic disease   Well controlled type 2 diabetes mellitus (HCC)   Bilateral lower extremity edema   Severe sepsis (HCC)  #1 severe sepsis secondary to UTI/acute pyelonephritis, MRSE bacteremia/obstructing left ureteral stone - Patient on admission met criteria for sepsis with tachycardia, tachypnea, significant leukocytosis, lactic acidosis, UTI with obstructing left ureteral stone. - Patient pancultured with blood cultures positive for MRSE. - Patient seen in consultation by urology underwent cystoscopy and left ureteral stent placement on 02/05/2024. - Urology recommending continuation of Foley catheter for 3 to 5 days with outpatient follow-up with urology. - Patient noted to be on IV Rocephin , patient noted to have been started on Zyvox on 02/06/2024. -Patient started on IV Vancomycin .  -Zyvox and Rocephin  discontinued. -Urology following. - ID following. - See problem #2.  #2 MRSE bacteremia -Blood cultures noted 4/4 positive for  MRSE. -2D echo done negative for vegetations. - Patient seen in consultation by ID who reviewed blood cultures, sensitivities and recommended TEE to look for vegetation. - Per ID patient declined stating that he was 78 years old and did not want to undergo a TEE. - Per ID patient agreeable to empiric antibiotics for a course for endocarditis. - Repeat blood cultures obtained 02/07/2024 with no growth to date - Per ID, PICC line may be placed if repeat blood cultures from 02/07/2024 with no growth x 3 days. - Continue IV vancomycin . - IV Rocephin  discontinued. - ID following and appreciate the input and recommendations.  3.  AKI/acute metabolic acidosis -Patient on presentation noted to have a creatinine of 3.61. - AKI likely postrenal azotemia secondary to obstructing left ureteral stone in the setting of Indocin. - Patient seen by urology underwent cystoscopy and left ureteral stent placement 02/05/2024. - Patient with a urine output of 4.5 to 5 L over the past 24 hours - Renal function slowly trending down creatinine down to 2.11 from as high as 3.61 on admission. - Last creatinine noted on 12/23/2023 at 1.46. - Patient noted with some lower extremity edema. - Saline lock IV fluids. - Acidosis improving. - Place on bicarb tablets. - Avoid nephrotoxic agents.  4. lactic acidosis -Resolved.  5.  Hypertension -Resume home regimen Toprol-XL.  6.  Hyperlipidemia -Continue statin.  7.  Prostate cancer -Continue tamsulosin . - Outpatient follow-up with oncology and radiation oncology.  8.  Anemia of chronic disease -Hemoglobin stable at 11.0.  9.  Well-controlled diabetes mellitus type 2 -Hemoglobin A1c 6.4 (12/23/2023). -CBG 124 this morning. - Patient noted to be on glipizide prior  to admission which is currently on hold. - SSI.  10.  Lower extremity edema -Patient with noted lower extremity edema. - Patient noted to be on IV fluids. - Saline lock IV fluids. -  Follow.  11.  Obesity class I -BMI 34.71 kg/m. - Lifestyle modification. - Outpatient follow-up with PCP.   DVT prophylaxis: Heparin Code Status: Full Family Communication: Updated patient.  No family at bedside. Disposition: Home when clinically improved, PICC line placed, cleared by ID and urology.  Likely home on Saturday, 02/11/2024  Status is: Inpatient Remains inpatient appropriate because: Severity of illness   Consultants:  Urology: Dr. Shane 02/05/2024 ID: Dr. Dennise 02/07/2024  Procedures:  CT abdomen and pelvis 02/05/2024 Chest x-ray 02/05/2024 2D echo 02/07/2024 Cystoscopy, left retrograde pyelogram with interpretation, left ureteral stent placement by urology: Dr. Shane 02/05/2024  Antimicrobials:  Anti-infectives (From admission, onward)    Start     Dose/Rate Route Frequency Ordered Stop   02/08/24 0930  vancomycin  (VANCOREADY) IVPB 750 mg/150 mL        750 mg 150 mL/hr over 60 Minutes Intravenous Every 12 hours 02/08/24 0843     02/07/24 1545  vancomycin  (VANCOREADY) IVPB 1500 mg/300 mL        1,500 mg 150 mL/hr over 120 Minutes Intravenous  Once 02/07/24 1452 02/07/24 1833   02/07/24 1502  vancomycin  variable dose per unstable renal function (pharmacist dosing)  Status:  Discontinued         Does not apply See admin instructions 02/07/24 1502 02/08/24 0844   02/07/24 0330  linezolid (ZYVOX) IVPB 600 mg  Status:  Discontinued        600 mg 300 mL/hr over 60 Minutes Intravenous Every 12 hours 02/06/24 1432 02/07/24 1452   02/06/24 1530  linezolid (ZYVOX) IVPB 600 mg        600 mg 300 mL/hr over 60 Minutes Intravenous STAT 02/06/24 1432 02/06/24 1852   02/06/24 1500  cefTRIAXone  (ROCEPHIN ) 2 g in sodium chloride  0.9 % 100 mL IVPB  Status:  Discontinued        2 g 200 mL/hr over 30 Minutes Intravenous Every 24 hours 02/05/24 2203 02/08/24 1049   02/05/24 1430  cefTRIAXone  (ROCEPHIN ) 2 g in sodium chloride  0.9 % 100 mL IVPB        2 g 200 mL/hr  over 30 Minutes Intravenous Once 02/05/24 1426 02/05/24 1542         Subjective: Patient laying in bed.  Denies any chest pain or shortness of breath.  No abdominal pain.  Some complaints of lower extremity swelling.  Foley catheter in place.  Objective: Vitals:   02/08/24 0500 02/08/24 0546 02/08/24 1337 02/08/24 2103  BP:  (!) 155/68 134/68 (!) 159/91  Pulse:  68 73 81  Resp:  20 20 20   Temp:  (!) 97.5 F (36.4 C) 98.2 F (36.8 C) 98.7 F (37.1 C)  TempSrc:  Oral Oral Oral  SpO2:  98% 97% 99%  Weight: 112.9 kg     Height:        Intake/Output Summary (Last 24 hours) at 02/08/2024 2146 Last data filed at 02/08/2024 1800 Gross per 24 hour  Intake 845.01 ml  Output 4375 ml  Net -3529.99 ml   Filed Weights   02/06/24 0500 02/07/24 0500 02/08/24 0500  Weight: 110.8 kg 113 kg 112.9 kg    Examination:  General exam: Appears calm and comfortable  Respiratory system: Clear to auscultation.  No wheezes, no crackles, no rhonchi.  Fair air movement.  Speaking in full sentences.  Respiratory effort normal. Cardiovascular system: S1 & S2 heard, RRR. No JVD, murmurs, rubs, gallops or clicks.  2+ bilateral lower extremity edema. Gastrointestinal system: Abdomen is nondistended, soft and nontender. No organomegaly or masses felt. Normal bowel sounds heard. Central nervous system: Alert and oriented. No focal neurological deficits. Extremities: 2+ bilateral lower extremity edema.  Symmetric 5 x 5 power. Skin: No rashes, lesions or ulcers Psychiatry: Judgement and insight appear normal. Mood & affect appropriate.     Data Reviewed: I have personally reviewed following labs and imaging studies  CBC: Recent Labs  Lab 02/05/24 1443 02/06/24 0045 02/07/24 0545 02/08/24 0537  WBC 25.2* 21.8* 17.7* 13.0*  NEUTROABS 22.9*  --  14.7* 9.3*  HGB 11.8* 10.6* 10.6* 11.0*  HCT 34.7* 32.2* 31.7* 33.5*  MCV 91.6 94.4 91.1 93.1  PLT 257 241 294 317    Basic Metabolic Panel: Recent  Labs  Lab 02/05/24 1443 02/05/24 2233 02/06/24 0045 02/07/24 0545 02/08/24 0537  NA 134*  --  135 133* 135  K 4.2  --  4.9 4.4 4.0  CL 98  --  101 97* 101  CO2 19*  --  21* 22 19*  GLUCOSE 190*  --  181* 136* 139*  BUN 38*  --  39* 48* 46*  CREATININE 3.61*  --  3.19* 2.50* 2.11*  CALCIUM  8.9  --  8.5* 9.0 8.8*  MG  --  1.8  --  2.1 2.2    GFR: Estimated Creatinine Clearance: 35.7 mL/min (A) (by C-G formula based on SCr of 2.11 mg/dL (H)).  Liver Function Tests: Recent Labs  Lab 02/05/24 1443  AST 19  ALT 12  ALKPHOS 92  BILITOT 1.1  PROT 7.6  ALBUMIN 3.9    CBG: Recent Labs  Lab 02/07/24 2056 02/08/24 0743 02/08/24 1106 02/08/24 1624 02/08/24 2100  GLUCAP 153* 124* 112* 168* 114*     Recent Results (from the past 240 hours)  Resp panel by RT-PCR (RSV, Flu A&B, Covid) Anterior Nasal Swab     Status: None   Collection Time: 02/05/24  1:58 PM   Specimen: Anterior Nasal Swab  Result Value Ref Range Status   SARS Coronavirus 2 by RT PCR NEGATIVE NEGATIVE Final    Comment: (NOTE) SARS-CoV-2 target nucleic acids are NOT DETECTED.  The SARS-CoV-2 RNA is generally detectable in upper respiratory specimens during the acute phase of infection. The lowest concentration of SARS-CoV-2 viral copies this assay can detect is 138 copies/mL. A negative result does not preclude SARS-Cov-2 infection and should not be used as the sole basis for treatment or other patient management decisions. A negative result may occur with  improper specimen collection/handling, submission of specimen other than nasopharyngeal swab, presence of viral mutation(s) within the areas targeted by this assay, and inadequate number of viral copies(<138 copies/mL). A negative result must be combined with clinical observations, patient history, and epidemiological information. The expected result is Negative.  Fact Sheet for Patients:  bloggercourse.com  Fact Sheet for  Healthcare Providers:  seriousbroker.it  This test is no t yet approved or cleared by the [place]  FDA and  has been authorized for detection and/or diagnosis of SARS-CoV-2 by FDA under an Emergency Use Authorization (EUA). This EUA will remain  in effect (meaning this test can be used) for the duration of the COVID-19 declaration under Section 564(b)(1) of the Act, 21 U.S.C.section 360bbb-3(b)(1), unless the authorization is terminated  or revoked sooner.  Influenza A by PCR NEGATIVE NEGATIVE Final   Influenza B by PCR NEGATIVE NEGATIVE Final    Comment: (NOTE) The Xpert Xpress SARS-CoV-2/FLU/RSV plus assay is intended as an aid in the diagnosis of influenza from Nasopharyngeal swab specimens and should not be used as a sole basis for treatment. Nasal washings and aspirates are unacceptable for Xpert Xpress SARS-CoV-2/FLU/RSV testing.  Fact Sheet for Patients: bloggercourse.com  Fact Sheet for Healthcare Providers: seriousbroker.it  This test is not yet approved or cleared by the [place]  FDA and has been authorized for detection and/or diagnosis of SARS-CoV-2 by FDA under an Emergency Use Authorization (EUA). This EUA will remain in effect (meaning this test can be used) for the duration of the COVID-19 declaration under Section 564(b)(1) of the Act, 21 U.S.C. section 360bbb-3(b)(1), unless the authorization is terminated or revoked.     Resp Syncytial Virus by PCR NEGATIVE NEGATIVE Final    Comment: (NOTE) Fact Sheet for Patients: bloggercourse.com  Fact Sheet for Healthcare Providers: seriousbroker.it  This test is not yet approved or cleared by the [place]  FDA and has been authorized for detection and/or diagnosis of SARS-CoV-2 by FDA under an Emergency Use Authorization (EUA). This EUA will remain in effect (meaning this  test can be used) for the duration of the COVID-19 declaration under Section 564(b)(1) of the Act, 21 U.S.C. section 360bbb-3(b)(1), unless the authorization is terminated or revoked.  Performed at [facility], [address], [place], [place] [address]   Urine Culture     Status: Abnormal   Collection Time: 02/05/24  2:05 PM   Specimen: Urine, Random  Result Value Ref Range Status   Specimen Description   Final    URINE, RANDOM Performed at [facility], [address], [place], [place] [address]    Special Requests   Final    NONE Reflexed from [id number] Performed at [facility], [address], [place], [place] [address]    Culture MULTIPLE SPECIES PRESENT, SUGGEST RECOLLECTION (A)  Final   Report Status 02/07/2024 FINAL  Final  Blood Culture (routine x 2)     Status: Abnormal (Preliminary result)   Collection Time: 02/05/24  2:09 PM   Specimen: BLOOD  Result Value Ref Range Status   Specimen Description   Final    BLOOD BLOOD RIGHT FOREARM Performed at [facility], [address] [place], [place] [address]    Special Requests   Final    BOTTLES DRAWN AEROBIC AND ANAEROBIC Blood Culture adequate volume Performed at [facility], [address], [place], [place] [address]    Culture  Setup Time   Final    GRAM POSITIVE COCCI IN BOTH AEROBIC AND ANAEROBIC BOTTLES Gram Stain Report Called to,Read Back By and Verified With: L SAUNDERS AT [facility] AT 0812 ON [id number] BY S DALTON    Culture (A)  Final    STAPHYLOCOCCUS EPIDERMIDIS Sent to [facility] for further susceptibility testing. Performed at [facility], [address] [place], [place] [address]    Report Status PENDING  Incomplete   Organism ID, Bacteria STAPHYLOCOCCUS EPIDERMIDIS  Final      Susceptibility   Staphylococcus epidermidis - MIC*    CIPROFLOXACIN  >=8 RESISTANT Resistant     ERYTHROMYCIN >=8 RESISTANT Resistant     GENTAMICIN  <=0.5 SENSITIVE Sensitive     OXACILLIN >=4 RESISTANT Resistant     TETRACYCLINE  2 SENSITIVE Sensitive     VANCOMYCIN  1 SENSITIVE Sensitive     TRIMETH/SULFA 80 RESISTANT Resistant  CLINDAMYCIN >=8 RESISTANT Resistant     RIFAMPIN <=0.5 SENSITIVE Sensitive     Inducible Clindamycin NEGATIVE Sensitive     * STAPHYLOCOCCUS EPIDERMIDIS  Blood Culture (routine x 2)     Status: Abnormal   Collection Time: 02/05/24  2:43 PM   Specimen: BLOOD  Result Value Ref Range Status   Specimen Description   Final    BLOOD BLOOD LEFT ARM Performed at [facility], [address] [place], [place] [address]    Special Requests   Final    BOTTLES DRAWN AEROBIC AND ANAEROBIC Blood Culture adequate volume Performed at [facility], [address], [place], [place] [address]    Culture  Setup Time   Final    GRAM POSITIVE COCCI IN BOTH AEROBIC AND ANAEROBIC BOTTLES Gram Stain Report Called to,Read Back By and Verified With: L SAUNDERS AT [facility] AT 0812 ON [id number] BY S DALTON CRITICAL RESULT CALLED TO, READ BACK BY AND VERIFIED WITH: PHARMD DREW WOFFORD ON 02/06/24 @ 1517 BY DRT Performed at [facility], [address], [place], [place] [address]    Culture STAPHYLOCOCCUS EPIDERMIDIS (A)  Final   Report Status 02/08/2024 FINAL  Final   Organism ID, Bacteria STAPHYLOCOCCUS EPIDERMIDIS  Final      Susceptibility   Staphylococcus epidermidis - MIC*    CIPROFLOXACIN  >=8 RESISTANT Resistant     ERYTHROMYCIN >=8 RESISTANT Resistant     GENTAMICIN  <=0.5 SENSITIVE Sensitive     OXACILLIN >=4 RESISTANT Resistant     TETRACYCLINE 2 SENSITIVE Sensitive     VANCOMYCIN  1 SENSITIVE Sensitive     TRIMETH/SULFA 160 RESISTANT Resistant     CLINDAMYCIN >=8 RESISTANT Resistant     RIFAMPIN <=0.5 SENSITIVE Sensitive     Inducible Clindamycin NEGATIVE Sensitive     * STAPHYLOCOCCUS EPIDERMIDIS  Blood Culture ID Panel (Reflexed)     Status: Abnormal   Collection Time: 02/05/24  2:43 PM  Result Value Ref Range Status   Enterococcus faecalis NOT DETECTED NOT DETECTED Final   Enterococcus  Faecium NOT DETECTED NOT DETECTED Final   Listeria monocytogenes NOT DETECTED NOT DETECTED Final   Staphylococcus species DETECTED (A) NOT DETECTED Final    Comment: CRITICAL RESULT CALLED TO, READ BACK BY AND VERIFIED WITH: PHARMD DREW WOFFORD ON 02/06/24 @ 1517 BY DRT    Staphylococcus aureus (BCID) NOT DETECTED NOT DETECTED Final   Staphylococcus epidermidis DETECTED (A) NOT DETECTED Final    Comment: Methicillin (oxacillin) resistant coagulase negative staphylococcus. Possible blood culture contaminant (unless isolated from more than one blood culture draw or clinical case suggests pathogenicity). No antibiotic treatment is indicated for blood  culture contaminants. CRITICAL RESULT CALLED TO, READ BACK BY AND VERIFIED WITH: PHARMD DREW WOFFORD ON 02/06/24 @ 1517 BY DRT    Staphylococcus lugdunensis NOT DETECTED NOT DETECTED Final   Streptococcus species NOT DETECTED NOT DETECTED Final   Streptococcus agalactiae NOT DETECTED NOT DETECTED Final   Streptococcus pneumoniae NOT DETECTED NOT DETECTED Final   Streptococcus pyogenes NOT DETECTED NOT DETECTED Final   A.calcoaceticus-baumannii NOT DETECTED NOT DETECTED Final   Bacteroides fragilis NOT DETECTED NOT DETECTED Final   Enterobacterales NOT DETECTED NOT DETECTED Final   Enterobacter cloacae complex NOT DETECTED NOT DETECTED Final   Escherichia coli NOT DETECTED NOT DETECTED Final   Klebsiella aerogenes NOT DETECTED NOT DETECTED Final   Klebsiella oxytoca NOT DETECTED NOT DETECTED Final   Klebsiella pneumoniae NOT DETECTED NOT DETECTED Final   Proteus species NOT DETECTED NOT DETECTED  Final   Salmonella species NOT DETECTED NOT DETECTED Final   Serratia marcescens NOT DETECTED NOT DETECTED Final   Haemophilus influenzae NOT DETECTED NOT DETECTED Final   Neisseria meningitidis NOT DETECTED NOT DETECTED Final   Pseudomonas aeruginosa NOT DETECTED NOT DETECTED Final   Stenotrophomonas maltophilia NOT DETECTED NOT DETECTED Final    Candida albicans NOT DETECTED NOT DETECTED Final   Candida auris NOT DETECTED NOT DETECTED Final   Candida glabrata NOT DETECTED NOT DETECTED Final   Candida krusei NOT DETECTED NOT DETECTED Final   Candida parapsilosis NOT DETECTED NOT DETECTED Final   Candida tropicalis NOT DETECTED NOT DETECTED Final   Cryptococcus neoformans/gattii NOT DETECTED NOT DETECTED Final   Methicillin resistance mecA/C DETECTED (A) NOT DETECTED Final    Comment: CRITICAL RESULT CALLED TO, READ BACK BY AND VERIFIED WITH: PHARMD DREW WOFFORD ON 02/06/24 @ 1517 BY DRT Performed at [facility], [address] [place], [place] [address]   Urine Culture     Status: Abnormal   Collection Time: 02/05/24  8:41 PM   Specimen: Urine, Cystoscope  Result Value Ref Range Status   Specimen Description   Final    URINE, RANDOM Performed at [facility], [address], [place], [place] [address]    Special Requests   Final    NONE Performed at [facility], [address] [place], [place] [address]    Culture MULTIPLE SPECIES PRESENT, SUGGEST RECOLLECTION (A)  Final   Report Status 02/07/2024 FINAL  Final  Culture, blood (Routine X 2) w Reflex to ID Panel     Status: None (Preliminary result)   Collection Time: 02/07/24 11:23 AM   Specimen: BLOOD RIGHT HAND  Result Value Ref Range Status   Specimen Description BLOOD RIGHT HAND  Final   Special Requests   Final    BOTTLES DRAWN AEROBIC ONLY Blood Culture results may not be optimal due to an inadequate volume of blood received in culture bottles   Culture   Final    NO GROWTH < 24 HOURS Performed at [facility], [address] [place], [place] [address]    Report Status PENDING  Incomplete  Culture, blood (Routine X 2) w Reflex to ID Panel     Status: None (Preliminary result)   Collection Time: 02/07/24 11:27 AM   Specimen: BLOOD RIGHT HAND  Result Value Ref Range Status   Specimen Description BLOOD RIGHT  HAND  Final   Special Requests   Final    BOTTLES DRAWN AEROBIC ONLY Blood Culture results may not be optimal due to an inadequate volume of blood received in culture bottles   Culture   Final    NO GROWTH < 24 HOURS Performed at [facility], [address], [place], [place] [address]    Report Status PENDING  Incomplete         Radiology Studies: ECHOCARDIOGRAM COMPLETE Result Date: 02/07/2024    ECHOCARDIOGRAM REPORT   Patient Name:   Marcus Simpson Date of Exam: 02/07/2024 Medical Rec #:  [id number]         Height:       71.0 in Accession #:    [id number]        Weight:       249.1 lb Date of Birth:  1946-02-07          BSA:          2.315 m Patient Age:  78 years          BP:           127/67 mmHg Patient Gender: M                 HR:           66 bpm. Exam Location:  Inpatient Procedure: 2D Echo and Intracardiac Opacification Agent (Both Spectral and Color            Flow Doppler were utilized during procedure). Indications:    Endocarditis  History:        Patient has no prior history of Echocardiogram examinations.  Sonographer:    Charmaine Gaskins Referring Phys: [id number] [facility] IMPRESSIONS  1. NO obvious vegetations Recommend TEE if clinically indicated.  2. Left ventricular ejection fraction, by estimation, is 60 to 65%. The left ventricle has normal function. The left ventricle has no regional wall motion abnormalities. Left ventricular diastolic parameters were normal.  3. Right ventricular systolic function is normal. The right ventricular size is normal.  4. The mitral valve is normal in structure. Trivial mitral valve regurgitation.  5. The aortic valve is normal in structure. Aortic valve regurgitation is not visualized.  6. Aortic dilatation noted. There is mild dilatation of the ascending aorta, measuring 41 mm. FINDINGS  Left Ventricle: Left ventricular ejection fraction, by estimation, is 60 to 65%. The left ventricle has normal function. The left ventricle has no  regional wall motion abnormalities. Definity contrast agent was given IV to delineate the left ventricular  endocardial borders. The left ventricular internal cavity size was normal in size. There is no left ventricular hypertrophy. Left ventricular diastolic parameters were normal. Right Ventricle: The right ventricular size is normal. Right vetricular wall thickness was not assessed. Right ventricular systolic function is normal. Left Atrium: Left atrial size was normal in size. Right Atrium: Right atrial size was normal in size. Pericardium: There is no evidence of pericardial effusion. Mitral Valve: The mitral valve is normal in structure. Trivial mitral valve regurgitation. Tricuspid Valve: The tricuspid valve is normal in structure. Tricuspid valve regurgitation is mild. Aortic Valve: The aortic valve is normal in structure. Aortic valve regurgitation is not visualized. Pulmonic Valve: The pulmonic valve was normal in structure. Pulmonic valve regurgitation is mild. Aorta: The aortic root is normal in size and structure and aortic dilatation noted. There is mild dilatation of the ascending aorta, measuring 41 mm. IAS/Shunts: No atrial level shunt detected by color flow Doppler.  LEFT VENTRICLE PLAX 2D LVIDd:         4.90 cm   Diastology LVIDs:         2.80 cm   LV e' medial:    6.64 cm/s LV PW:         0.90 cm   LV E/e' medial:  11.8 LV IVS:        0.90 cm   LV e' lateral:   7.18 cm/s LVOT diam:     2.20 cm   LV E/e' lateral: 10.9 LVOT Area:     3.80 cm  RIGHT VENTRICLE RV Basal diam:  3.30 cm RV Mid diam:    3.50 cm RV S prime:     14.10 cm/s LEFT ATRIUM             Index        RIGHT ATRIUM           Index LA Vol (A2C):   78.4 ml 33.87 ml/m  RA Area:     16.80 cm LA Vol (A4C):   71.1 ml 30.71 ml/m  RA Volume:   41.60 ml  17.97 ml/m LA Biplane Vol: 75.6 ml 32.66 ml/m   AORTA Ao Root diam: 3.50 cm Ao Asc diam:  4.10 cm MITRAL VALVE               TRICUSPID VALVE MV Area (PHT): 3.38 cm    TR Peak grad:    29.4 mmHg MV E velocity: 78.40 cm/s  TR Vmax:        271.00 cm/s MV A velocity: 87.80 cm/s MV E/A ratio:  0.89        SHUNTS                            Systemic Diam: 2.20 cm Vina Gull MD Electronically signed by Vina Gull MD Signature Date/Time: 02/07/2024/2:08:38 PM    Final         Scheduled Meds:  Chlorhexidine  Gluconate Cloth  6 each Topical Q0600   cyanocobalamin  1,000 mcg Oral Daily   heparin  5,000 Units Subcutaneous Q8H   insulin  aspart  0-5 Units Subcutaneous QHS   insulin  aspart  0-6 Units Subcutaneous TID WC   metoprolol succinate  25 mg Oral Daily   [START ON 02/11/2024] rosuvastatin   5 mg Oral Q Sat   senna-docusate  1 tablet Oral BID   sodium bicarbonate  650 mg Oral BID   sodium chloride  flush  3 mL Intravenous Q12H   tamsulosin   0.4 mg Oral Daily   triamcinolone  cream  1 Application Topical BID   Continuous Infusions:  vancomycin  750 mg (02/08/24 2125)     LOS: 3 days    Time spent: 40 minutes    Toribio Hummer, MD [facility]   To contact the attending provider between 7A-7P or the covering provider during after hours 7P-7A, please log into the web site [email or web address] and access using universal [place] password for that web site. If you do not have the password, please call the hospital operator.  02/08/2024, 9:46 PM

## 2024-02-09 ENCOUNTER — Other Ambulatory Visit: Payer: Self-pay | Admitting: Urology

## 2024-02-09 DIAGNOSIS — N201 Calculus of ureter: Secondary | ICD-10-CM

## 2024-02-09 DIAGNOSIS — N178 Other acute kidney failure: Secondary | ICD-10-CM | POA: Diagnosis not present

## 2024-02-09 DIAGNOSIS — I10 Essential (primary) hypertension: Secondary | ICD-10-CM | POA: Diagnosis not present

## 2024-02-09 DIAGNOSIS — C61 Malignant neoplasm of prostate: Secondary | ICD-10-CM | POA: Diagnosis not present

## 2024-02-09 DIAGNOSIS — E785 Hyperlipidemia, unspecified: Secondary | ICD-10-CM | POA: Diagnosis not present

## 2024-02-09 LAB — BASIC METABOLIC PANEL WITH GFR
Anion gap: 15 (ref 5–15)
BUN: 39 mg/dL — ABNORMAL HIGH (ref 8–23)
CO2: 19 mmol/L — ABNORMAL LOW (ref 22–32)
Calcium: 9.3 mg/dL (ref 8.9–10.3)
Chloride: 104 mmol/L (ref 98–111)
Creatinine, Ser: 1.88 mg/dL — ABNORMAL HIGH (ref 0.61–1.24)
GFR, Estimated: 36 mL/min — ABNORMAL LOW (ref >60)
Glucose, Bld: 158 mg/dL — ABNORMAL HIGH (ref 70–99)
Potassium: 4.2 mmol/L (ref 3.5–5.1)
Sodium: 138 mmol/L (ref 135–145)

## 2024-02-09 LAB — CBC WITH DIFFERENTIAL/PLATELET
Abs Immature Granulocytes: 0.09 K/uL — ABNORMAL HIGH (ref 0.00–0.07)
Basophils Absolute: 0.1 K/uL (ref 0.0–0.1)
Basophils Relative: 1 %
Eosinophils Absolute: 0.5 K/uL (ref 0.0–0.5)
Eosinophils Relative: 4 %
HCT: 34.1 % — ABNORMAL LOW (ref 39.0–52.0)
Hemoglobin: 11 g/dL — ABNORMAL LOW (ref 13.0–17.0)
Immature Granulocytes: 1 %
Lymphocytes Relative: 18 %
Lymphs Abs: 2.3 K/uL (ref 0.7–4.0)
MCH: 30.1 pg (ref 26.0–34.0)
MCHC: 32.3 g/dL (ref 30.0–36.0)
MCV: 93.4 fL (ref 80.0–100.0)
Monocytes Absolute: 1.5 K/uL — ABNORMAL HIGH (ref 0.1–1.0)
Monocytes Relative: 12 %
Neutro Abs: 8.4 K/uL — ABNORMAL HIGH (ref 1.7–7.7)
Neutrophils Relative %: 64 %
Platelets: 314 K/uL (ref 150–400)
RBC: 3.65 MIL/uL — ABNORMAL LOW (ref 4.22–5.81)
RDW: 13.5 % (ref 11.5–15.5)
WBC: 12.9 K/uL — ABNORMAL HIGH (ref 4.0–10.5)
nRBC: 0 % (ref 0.0–0.2)

## 2024-02-09 LAB — GLUCOSE, CAPILLARY
Glucose-Capillary: 128 mg/dL — ABNORMAL HIGH (ref 70–99)
Glucose-Capillary: 143 mg/dL — ABNORMAL HIGH (ref 70–99)
Glucose-Capillary: 157 mg/dL — ABNORMAL HIGH (ref 70–99)

## 2024-02-09 LAB — CK: Total CK: 55 U/L (ref 49–397)

## 2024-02-09 MED ORDER — DAPTOMYCIN-SODIUM CHLORIDE 700-0.9 MG/100ML-% IV SOLN
8.0000 mg/kg | Freq: Every day | INTRAVENOUS | Status: DC
  Administered 2024-02-09 – 2024-02-13 (×5): 700 mg via INTRAVENOUS
  Filled 2024-02-09 (×5): qty 100

## 2024-02-09 MED ORDER — OXYCODONE-ACETAMINOPHEN 5-325 MG PO TABS: 1.0000 | ORAL_TABLET | Freq: Four times a day (QID) | ORAL | 0 refills | Status: DC | PRN

## 2024-02-09 NOTE — Progress Notes (Signed)
 PHARMACY CONSULT NOTE FOR:  OUTPATIENT  PARENTERAL ANTIBIOTIC THERAPY (OPAT)  Indication: MRSE bacteremia Regimen: Daptomycin 700 mg IV every 24 hours End date: 03/20/24  IV antibiotic discharge orders are pended. To discharging provider:  please sign these orders via discharge navigator,  Select New Orders & click on the button choice - Manage This Unsigned Work.     Thank you for allowing pharmacy to be a part of this patient's care.  Almarie Lunger, PharmD, BCPS, BCIDP Infectious Diseases Clinical Pharmacist 02/09/2024 3:25 PM   **Pharmacist phone directory can now be found on [email or web address] ([email or web address]).  Listed under [facility].

## 2024-02-09 NOTE — Progress Notes (Signed)
 PROGRESS NOTE    Marcus Simpson  FMW:[id number] DOB: Apr 27, 1945 DOA: 02/05/2024 PCP: [name], MD    Chief Complaint  Patient presents with   Fever    Brief Narrative:  78 year old male with history of hypertension, hyperlipidemia, diabetes mellitus type 2, prostate cancer, ureteral stone presented with nausea and vomiting after being started on treatment for UTI.  On presentation, WBC was 25.2, creatinine of 3.61, lactic acid of 3 which improved to 2.7.  UA suggestive of UTI.  CT showed findings suggestive of cystitis and 6 mm obstructing left ureteral stone stone.  Patient underwent cystoscopy and stent placement by urology.     Assessment & Plan:   Principal Problem:   Acute kidney injury due to urinary tract obstruction Active Problems:   Hyperlipidemia   Essential hypertension   Prostate cancer (HCC)   Bacteremia due to methicillin resistant Staphylococcus epidermidis   Acute pyelonephritis   Left ureteral calculus   Anemia of chronic disease   Well controlled type 2 diabetes mellitus (HCC)   Bilateral lower extremity edema   Severe sepsis (HCC)  #1 severe sepsis secondary to UTI/acute pyelonephritis, MRSE bacteremia/obstructing left ureteral stone - Patient on admission met criteria for sepsis with tachycardia, tachypnea, significant leukocytosis, lactic acidosis, UTI with obstructing left ureteral stone. - Patient pancultured with blood cultures positive for MRSE. - Patient seen in consultation by urology underwent cystoscopy and left ureteral stent placement on 02/05/2024. - Urology recommending continuation of Foley catheter for 3 to 5 days with outpatient follow-up with urology. - Patient noted to be on IV Rocephin , patient noted to have been started on Zyvox on 02/06/2024. -Patient started on IV Vancomycin .  -Zyvox and Rocephin  discontinued. -Urology following. - ID following. - See problem #2.  #2 MRSE bacteremia -Blood cultures noted 4/4 positive for  MRSE. -2D echo done negative for vegetations. - Patient seen in consultation by ID who reviewed blood cultures, sensitivities and recommended TEE to look for vegetation. - Per ID patient declined stating that he was 78 years old and did not want to undergo a TEE. - Per ID patient agreeable to empiric antibiotics for a course for endocarditis. - Repeat blood cultures obtained 02/07/2024 with no growth to date - Per ID, PICC line may be placed if repeat blood cultures from 02/07/2024 with no growth x 3 days. - Continue IV vancomycin . - IV Rocephin  discontinued. - ID following and appreciate the input and recommendations.  3.  AKI/acute metabolic acidosis -Patient on presentation noted to have a creatinine of 3.61. - AKI likely postrenal azotemia secondary to obstructing left ureteral stone in the setting of Indocin. - Patient seen by urology underwent cystoscopy and left ureteral stent placement 02/05/2024. - Patient with a urine output of 4.1 L over the past 24 hours - Renal function slowly trending down creatinine down to 1.88 from 2.11 from as high as 3.61 on admission. - Last creatinine noted on 12/23/2023 at 1.46. - Patient noted with some lower extremity edema. -Patient seems to be auto diuresing. - Saline lock IV fluids. - Acidosis improving. - Continue bicarb tablets. - Avoid nephrotoxic agents.  4. lactic acidosis -Resolved.  5.  Hypertension - Continue home regimen Toprol-XL.  6.  Hyperlipidemia - Statin.  7.  Prostate cancer -Continue tamsulosin . - Outpatient follow-up with oncology and radiation oncology.  8.  Anemia of chronic disease -Hemoglobin stable at 11.0.  9.  Well-controlled diabetes mellitus type 2 -Hemoglobin A1c 6.4 (12/23/2023). -CBG 128 this morning. - Patient  noted to be on glipizide prior to admission which is currently on hold. - Continue SSI.   10.  Lower extremity edema -Patient with noted lower extremity edema. - Patient noted to be on IV  fluids. -IV fluids discontinued. -Patient seems to be auto diuresing with urine output of 4.1 L over the past 24 hours. -Follow urine output.  11.  Obesity class I -BMI 34.71 kg/m. - Lifestyle modification. - Outpatient follow-up with PCP.   DVT prophylaxis: Heparin Code Status: Full Family Communication: Updated patient.  Updated wife at bedside.   Disposition: Home when clinically improved, PICC line placed, cleared by ID and urology.  Likely home on Saturday, 02/11/2024  Status is: Inpatient Remains inpatient appropriate because: Severity of illness   Consultants:  Urology: Dr. Shane 02/05/2024 ID: Dr. Dennise 02/07/2024  Procedures:  CT abdomen and pelvis 02/05/2024 Chest x-ray 02/05/2024 2D echo 02/07/2024 Cystoscopy, left retrograde pyelogram with interpretation, left ureteral stent placement by urology: Dr. Shane 02/05/2024  Antimicrobials:  Anti-infectives (From admission, onward)    Start     Dose/Rate Route Frequency Ordered Stop   02/09/24 1530  DAPTOmycin (CUBICIN) IVPB 700 mg/150mL premix        8 mg/kg  89.3 kg (Adjusted) 200 mL/hr over 30 Minutes Intravenous Daily 02/09/24 1442     02/08/24 0930  vancomycin  (VANCOREADY) IVPB 750 mg/150 mL  Status:  Discontinued        750 mg 150 mL/hr over 60 Minutes Intravenous Every 12 hours 02/08/24 0843 02/09/24 1442   02/07/24 1545  vancomycin  (VANCOREADY) IVPB 1500 mg/300 mL        1,500 mg 150 mL/hr over 120 Minutes Intravenous  Once 02/07/24 1452 02/07/24 1833   02/07/24 1502  vancomycin  variable dose per unstable renal function (pharmacist dosing)  Status:  Discontinued         Does not apply See admin instructions 02/07/24 1502 02/08/24 0844   02/07/24 0330  linezolid (ZYVOX) IVPB 600 mg  Status:  Discontinued        600 mg 300 mL/hr over 60 Minutes Intravenous Every 12 hours 02/06/24 1432 02/07/24 1452   02/06/24 1530  linezolid (ZYVOX) IVPB 600 mg        600 mg 300 mL/hr over 60 Minutes Intravenous  STAT 02/06/24 1432 02/06/24 1852   02/06/24 1500  cefTRIAXone  (ROCEPHIN ) 2 g in sodium chloride  0.9 % 100 mL IVPB  Status:  Discontinued        2 g 200 mL/hr over 30 Minutes Intravenous Every 24 hours 02/05/24 2203 02/08/24 1049   02/05/24 1430  cefTRIAXone  (ROCEPHIN ) 2 g in sodium chloride  0.9 % 100 mL IVPB        2 g 200 mL/hr over 30 Minutes Intravenous Once 02/05/24 1426 02/05/24 1542         Subjective: Sitting up in recliner with legs elevated.  Denies any chest pain or shortness of breath.  No abdominal pain.  Concerned about lower extremity edema however states he is improving from this morning.  Foley catheter in place.  Objective: Vitals:   02/08/24 2103 02/09/24 0500 02/09/24 0611 02/09/24 1358  BP: (!) 159/91  (!) 171/71 (!) 141/63  Pulse: 81  85 75  Resp: 20  18   Temp: 98.7 F (37.1 C)  97.7 F (36.5 C) 97.8 F (36.6 C)  TempSrc: Oral  Oral Oral  SpO2: 99%  96% 97%  Weight:  110.4 kg    Height:  Intake/Output Summary (Last 24 hours) at 02/09/2024 1942 Last data filed at 02/09/2024 1856 Gross per 24 hour  Intake 310.83 ml  Output 3475 ml  Net -3164.17 ml   Filed Weights   02/07/24 0500 02/08/24 0500 02/09/24 0500  Weight: 113 kg 112.9 kg 110.4 kg    Examination:  General exam: NAD Respiratory system: CTAB.  No wheezes, no crackles, no rhonchi.  Fair air movement.  Speaking in full sentences.  Cardiovascular system: Regular rate rhythm no murmurs rubs or gallops.  No JVD.  1-2+ bilateral lower extremity edema.  Gastrointestinal system: Abdomen is soft, nontender, nondistended, positive bowel sounds.  No rebound.  No guarding.  Central nervous system: Alert and oriented. No focal neurological deficits. Extremities: 1-2+ bilateral lower extremity edema.  Symmetric 5 x 5 power. Skin: No rashes, lesions or ulcers Psychiatry: Judgement and insight appear normal. Mood & affect appropriate.     Data Reviewed: I have personally reviewed following labs  and imaging studies  CBC: Recent Labs  Lab 02/05/24 1443 02/06/24 0045 02/07/24 0545 02/08/24 0537 02/09/24 0558  WBC 25.2* 21.8* 17.7* 13.0* 12.9*  NEUTROABS 22.9*  --  14.7* 9.3* 8.4*  HGB 11.8* 10.6* 10.6* 11.0* 11.0*  HCT 34.7* 32.2* 31.7* 33.5* 34.1*  MCV 91.6 94.4 91.1 93.1 93.4  PLT 257 241 294 317 314    Basic Metabolic Panel: Recent Labs  Lab 02/05/24 1443 02/05/24 2233 02/06/24 0045 02/07/24 0545 02/08/24 0537 02/09/24 0558  NA 134*  --  135 133* 135 138  K 4.2  --  4.9 4.4 4.0 4.2  CL 98  --  101 97* 101 104  CO2 19*  --  21* 22 19* 19*  GLUCOSE 190*  --  181* 136* 139* 158*  BUN 38*  --  39* 48* 46* 39*  CREATININE 3.61*  --  3.19* 2.50* 2.11* 1.88*  CALCIUM  8.9  --  8.5* 9.0 8.8* 9.3  MG  --  1.8  --  2.1 2.2  --     GFR: Estimated Creatinine Clearance: 39.6 mL/min (A) (by C-G formula based on SCr of 1.88 mg/dL (H)).  Liver Function Tests: Recent Labs  Lab 02/05/24 1443  AST 19  ALT 12  ALKPHOS 92  BILITOT 1.1  PROT 7.6  ALBUMIN 3.9    CBG: Recent Labs  Lab 02/08/24 1624 02/08/24 2100 02/09/24 0800 02/09/24 1142 02/09/24 1636  GLUCAP 168* 114* 128* 143* 157*     Recent Results (from the past 240 hours)  Resp panel by RT-PCR (RSV, Flu A&B, Covid) Anterior Nasal Swab     Status: None   Collection Time: 02/05/24  1:58 PM   Specimen: Anterior Nasal Swab  Result Value Ref Range Status   SARS Coronavirus 2 by RT PCR NEGATIVE NEGATIVE Final    Comment: (NOTE) SARS-CoV-2 target nucleic acids are NOT DETECTED.  The SARS-CoV-2 RNA is generally detectable in upper respiratory specimens during the acute phase of infection. The lowest concentration of SARS-CoV-2 viral copies this assay can detect is 138 copies/mL. A negative result does not preclude SARS-Cov-2 infection and should not be used as the sole basis for treatment or other patient management decisions. A negative result may occur with  improper specimen collection/handling,  submission of specimen other than nasopharyngeal swab, presence of viral mutation(s) within the areas targeted by this assay, and inadequate number of viral copies(<138 copies/mL). A negative result must be combined with clinical observations, patient history, and epidemiological information. The expected result is Negative.  Fact Sheet for Patients:  bloggercourse.com  Fact Sheet for Healthcare Providers:  seriousbroker.it  This test is no t yet approved or cleared by the [place]  FDA and  has been authorized for detection and/or diagnosis of SARS-CoV-2 by FDA under an Emergency Use Authorization (EUA). This EUA will remain  in effect (meaning this test can be used) for the duration of the COVID-19 declaration under Section 564(b)(1) of the Act, 21 U.S.C.section 360bbb-3(b)(1), unless the authorization is terminated  or revoked sooner.       Influenza A by PCR NEGATIVE NEGATIVE Final   Influenza B by PCR NEGATIVE NEGATIVE Final    Comment: (NOTE) The Xpert Xpress SARS-CoV-2/FLU/RSV plus assay is intended as an aid in the diagnosis of influenza from Nasopharyngeal swab specimens and should not be used as a sole basis for treatment. Nasal washings and aspirates are unacceptable for Xpert Xpress SARS-CoV-2/FLU/RSV testing.  Fact Sheet for Patients: bloggercourse.com  Fact Sheet for Healthcare Providers: seriousbroker.it  This test is not yet approved or cleared by the [place]  FDA and has been authorized for detection and/or diagnosis of SARS-CoV-2 by FDA under an Emergency Use Authorization (EUA). This EUA will remain in effect (meaning this test can be used) for the duration of the COVID-19 declaration under Section 564(b)(1) of the Act, 21 U.S.C. section 360bbb-3(b)(1), unless the authorization is terminated or revoked.     Resp Syncytial Virus by PCR NEGATIVE  NEGATIVE Final    Comment: (NOTE) Fact Sheet for Patients: bloggercourse.com  Fact Sheet for Healthcare Providers: seriousbroker.it  This test is not yet approved or cleared by the [place]  FDA and has been authorized for detection and/or diagnosis of SARS-CoV-2 by FDA under an Emergency Use Authorization (EUA). This EUA will remain in effect (meaning this test can be used) for the duration of the COVID-19 declaration under Section 564(b)(1) of the Act, 21 U.S.C. section 360bbb-3(b)(1), unless the authorization is terminated or revoked.  Performed at [facility], [address], [place], [place] [address]   Urine Culture     Status: Abnormal   Collection Time: 02/05/24  2:05 PM   Specimen: Urine, Random  Result Value Ref Range Status   Specimen Description   Final    URINE, RANDOM Performed at [facility], [address] [place], [place] [address]    Special Requests   Final    NONE Reflexed from [id number] Performed at [facility], [address] [place], [place] [address]    Culture MULTIPLE SPECIES PRESENT, SUGGEST RECOLLECTION (A)  Final   Report Status 02/07/2024 FINAL  Final  Blood Culture (routine x 2)     Status: Abnormal (Preliminary result)   Collection Time: 02/05/24  2:09 PM   Specimen: BLOOD  Result Value Ref Range Status   Specimen Description   Final    BLOOD BLOOD RIGHT FOREARM Performed at [facility], [address], [place], [place] [address]    Special Requests   Final    BOTTLES DRAWN AEROBIC AND ANAEROBIC Blood Culture adequate volume Performed at [facility], [address] [place], [place] [address]    Culture  Setup Time   Final    GRAM POSITIVE COCCI IN BOTH AEROBIC AND ANAEROBIC BOTTLES Gram Stain Report Called to,Read Back By and Verified With: L SAUNDERS AT [facility] AT 0812 ON [id number] BY S DALTON    Culture (A)  Final    STAPHYLOCOCCUS EPIDERMIDIS Sent to [facility] for further susceptibility  testing. Performed at [facility], [address] [place], [place] [address]    Report Status PENDING  Incomplete   Organism ID, Bacteria STAPHYLOCOCCUS EPIDERMIDIS  Final      Susceptibility   Staphylococcus epidermidis - MIC*    CIPROFLOXACIN  >=8 RESISTANT Resistant     ERYTHROMYCIN >=8 RESISTANT Resistant     GENTAMICIN  <=0.5 SENSITIVE Sensitive     OXACILLIN >=4 RESISTANT Resistant     TETRACYCLINE 2 SENSITIVE Sensitive     VANCOMYCIN  1 SENSITIVE Sensitive     TRIMETH/SULFA 80 RESISTANT Resistant     CLINDAMYCIN >=8 RESISTANT Resistant     RIFAMPIN <=0.5 SENSITIVE Sensitive     Inducible Clindamycin NEGATIVE Sensitive     * STAPHYLOCOCCUS EPIDERMIDIS  Blood Culture (routine x 2)     Status: Abnormal   Collection Time: 02/05/24  2:43 PM   Specimen: BLOOD  Result Value Ref Range Status   Specimen Description   Final    BLOOD BLOOD LEFT ARM Performed at [facility], [address], [place], [place] [address]    Special Requests   Final    BOTTLES DRAWN AEROBIC AND ANAEROBIC Blood Culture adequate volume Performed at [facility], [address], [place], [place] [address]    Culture  Setup Time   Final    GRAM POSITIVE COCCI IN BOTH AEROBIC AND ANAEROBIC BOTTLES Gram Stain Report Called to,Read Back By and Verified With: L SAUNDERS AT [facility] AT 0812 ON [id number] BY S DALTON CRITICAL RESULT CALLED TO, READ BACK BY AND VERIFIED WITH: PHARMD DREW WOFFORD ON 02/06/24 @ 1517 BY DRT Performed at [facility], [address] [place], [place] [address]    Culture STAPHYLOCOCCUS EPIDERMIDIS (A)  Final   Report Status 02/08/2024 FINAL  Final   Organism ID, Bacteria STAPHYLOCOCCUS EPIDERMIDIS  Final      Susceptibility   Staphylococcus epidermidis - MIC*    CIPROFLOXACIN  >=8 RESISTANT Resistant     ERYTHROMYCIN >=8 RESISTANT Resistant     GENTAMICIN  <=0.5 SENSITIVE Sensitive     OXACILLIN >=4 RESISTANT Resistant     TETRACYCLINE 2 SENSITIVE Sensitive     VANCOMYCIN  1  SENSITIVE Sensitive     TRIMETH/SULFA 160 RESISTANT Resistant     CLINDAMYCIN >=8 RESISTANT Resistant     RIFAMPIN <=0.5 SENSITIVE Sensitive     Inducible Clindamycin NEGATIVE Sensitive     * STAPHYLOCOCCUS EPIDERMIDIS  Blood Culture ID Panel (Reflexed)     Status: Abnormal   Collection Time: 02/05/24  2:43 PM  Result Value Ref Range Status   Enterococcus faecalis NOT DETECTED NOT DETECTED Final   Enterococcus Faecium NOT DETECTED NOT DETECTED Final   Listeria monocytogenes NOT DETECTED NOT DETECTED Final   Staphylococcus species DETECTED (A) NOT DETECTED Final    Comment: CRITICAL RESULT CALLED TO, READ BACK BY AND VERIFIED WITH: PHARMD DREW WOFFORD ON 02/06/24 @ 1517 BY DRT    Staphylococcus aureus (BCID) NOT DETECTED NOT DETECTED Final   Staphylococcus epidermidis DETECTED (A) NOT DETECTED Final    Comment: Methicillin (oxacillin) resistant coagulase negative staphylococcus. Possible blood culture contaminant (unless isolated from more than one blood culture draw or clinical case suggests pathogenicity). No antibiotic treatment is indicated for blood  culture contaminants. CRITICAL RESULT CALLED TO, READ BACK BY AND VERIFIED WITH: PHARMD DREW WOFFORD ON 02/06/24 @ 1517 BY DRT    Staphylococcus lugdunensis NOT DETECTED NOT DETECTED Final   Streptococcus species NOT DETECTED NOT DETECTED Final   Streptococcus agalactiae NOT DETECTED NOT DETECTED Final  Streptococcus pneumoniae NOT DETECTED NOT DETECTED Final   Streptococcus pyogenes NOT DETECTED NOT DETECTED Final   A.calcoaceticus-baumannii NOT DETECTED NOT DETECTED Final   Bacteroides fragilis NOT DETECTED NOT DETECTED Final   Enterobacterales NOT DETECTED NOT DETECTED Final   Enterobacter cloacae complex NOT DETECTED NOT DETECTED Final   Escherichia coli NOT DETECTED NOT DETECTED Final   Klebsiella aerogenes NOT DETECTED NOT DETECTED Final   Klebsiella oxytoca NOT DETECTED NOT DETECTED Final   Klebsiella pneumoniae NOT  DETECTED NOT DETECTED Final   Proteus species NOT DETECTED NOT DETECTED Final   Salmonella species NOT DETECTED NOT DETECTED Final   Serratia marcescens NOT DETECTED NOT DETECTED Final   Haemophilus influenzae NOT DETECTED NOT DETECTED Final   Neisseria meningitidis NOT DETECTED NOT DETECTED Final   Pseudomonas aeruginosa NOT DETECTED NOT DETECTED Final   Stenotrophomonas maltophilia NOT DETECTED NOT DETECTED Final   Candida albicans NOT DETECTED NOT DETECTED Final   Candida auris NOT DETECTED NOT DETECTED Final   Candida glabrata NOT DETECTED NOT DETECTED Final   Candida krusei NOT DETECTED NOT DETECTED Final   Candida parapsilosis NOT DETECTED NOT DETECTED Final   Candida tropicalis NOT DETECTED NOT DETECTED Final   Cryptococcus neoformans/gattii NOT DETECTED NOT DETECTED Final   Methicillin resistance mecA/C DETECTED (A) NOT DETECTED Final    Comment: CRITICAL RESULT CALLED TO, READ BACK BY AND VERIFIED WITH: PHARMD DREW WOFFORD ON 02/06/24 @ 1517 BY DRT Performed at [facility], [address] [place], [place] [address]   Urine Culture     Status: Abnormal   Collection Time: 02/05/24  8:41 PM   Specimen: Urine, Cystoscope  Result Value Ref Range Status   Specimen Description   Final    URINE, RANDOM Performed at [facility], [address], [place], [place] [address]    Special Requests   Final    NONE Performed at [facility], [address], [place], [place] [address]    Culture MULTIPLE SPECIES PRESENT, SUGGEST RECOLLECTION (A)  Final   Report Status 02/07/2024 FINAL  Final  Culture, blood (Routine X 2) w Reflex to ID Panel     Status: None (Preliminary result)   Collection Time: 02/07/24 11:23 AM   Specimen: BLOOD RIGHT HAND  Result Value Ref Range Status   Specimen Description BLOOD RIGHT HAND  Final   Special Requests   Final    BOTTLES DRAWN AEROBIC ONLY Blood Culture results may not be optimal due to an inadequate  volume of blood received in culture bottles   Culture   Final    NO GROWTH 2 DAYS Performed at [facility], [address], [place], [place] [address]    Report Status PENDING  Incomplete  Culture, blood (Routine X 2) w Reflex to ID Panel     Status: None (Preliminary result)   Collection Time: 02/07/24 11:27 AM   Specimen: BLOOD RIGHT HAND  Result Value Ref Range Status   Specimen Description BLOOD RIGHT HAND  Final   Special Requests   Final    BOTTLES DRAWN AEROBIC ONLY Blood Culture results may not be optimal due to an inadequate volume of blood received in culture bottles   Culture   Final    NO GROWTH 2 DAYS Performed at [facility], [address] [place], [place] [address]    Report Status PENDING  Incomplete         Radiology Studies: No results found.  Scheduled Meds:  Chlorhexidine  Gluconate Cloth  6 each Topical Q0600   cyanocobalamin  1,000 mcg Oral Daily   heparin  5,000 Units Subcutaneous Q8H   insulin  aspart  0-5 Units Subcutaneous QHS   insulin  aspart  0-6 Units Subcutaneous TID WC   metoprolol succinate  25 mg Oral Daily   [START ON 02/11/2024] rosuvastatin   5 mg Oral Q Sat   senna-docusate  1 tablet Oral BID   sodium bicarbonate  650 mg Oral BID   sodium chloride  flush  3 mL Intravenous Q12H   tamsulosin   0.4 mg Oral Daily   triamcinolone  cream  1 Application Topical BID   Continuous Infusions:  DAPTOmycin 700 mg (02/09/24 1536)     LOS: 4 days    Time spent: 40 minutes    Toribio Hummer, MD [facility]   To contact the attending provider between 7A-7P or the covering provider during after hours 7P-7A, please log into the web site [email or web address] and access using universal [place] password for that web site. If you do not have the password, please call the hospital operator.  02/09/2024, 7:42 PM

## 2024-02-09 NOTE — Plan of Care (Signed)
   Problem: Fluid Volume: Goal: Hemodynamic stability will improve Outcome: Progressing   Problem: Clinical Measurements: Goal: Diagnostic test results will improve Outcome: Progressing Goal: Signs and symptoms of infection will decrease Outcome: Progressing   Problem: Respiratory: Goal: Ability to maintain adequate ventilation will improve Outcome: Progressing

## 2024-02-09 NOTE — TOC Progression Note (Signed)
 Transition of Care [facility]) - Progression Note   Patient Details  Name: Marcus Simpson MRN: [id number] Date of Birth: 06-19-1945  Transition of Care [facility]) CM/SW Contact  Duwaine GORMAN [name], LCSW Phone Number: 02/09/2024, 11:15 AM  Clinical Narrative: CSW notified patient will discharge home with IV antibiotics. CSW met with patient and wife to discuss discharge plan. Patient agreeable to [facility] providing HHRN in addition to the IV antibiotics. CSW coordinating discharge with Pam with [facility]. Care management to follow.  Expected Discharge Plan: Home w Home Health Services Barriers to Discharge: Continued Medical Work up  Expected Discharge Plan and Services In-house Referral: Clinical Social Work Post Acute Care Choice: Home Health Living arrangements for the past 2 months: Single Family Home           DME Arranged: N/A DME Agency: NA HH Arranged: RN, IV Antibiotics HH Agency: [employer] Date HH Agency Contacted: 02/09/24 Representative spoke with at [facility] Agency: Pam  Social Drivers of Health (SDOH) Interventions SDOH Screenings   Food Insecurity: No Food Insecurity (02/05/2024)  Housing: Low Risk  (02/05/2024)  Transportation Needs: No Transportation Needs (02/05/2024)  Utilities: Not At Risk (02/05/2024)  Alcohol Screen: Low Risk  (10/11/2023)  Depression (PHQ2-9): Low Risk  (01/19/2024)  Social Connections: Moderately Isolated (02/05/2024)  Tobacco Use: Medium Risk (02/05/2024)   Readmission Risk Interventions     No data to display

## 2024-02-09 NOTE — Progress Notes (Signed)
 PT Cancellation Note  Patient Details Name: Marcus Simpson MRN: [id number] DOB: January 13, 1946   Cancelled Treatment:    Reason Eval/Treat Not Completed: Pain limiting ability to participate (pt reports he's got new swelling in BLEs and is in too much pain to ambulate. He attemped to walk with his wife recently but could not 2* pain. Will follow.)   Sylvan Delon Copp PT 02/09/2024  Acute Rehabilitation Services  Office [phone]

## 2024-02-09 NOTE — Plan of Care (Signed)
  Problem: Clinical Measurements: Goal: Diagnostic test results will improve Outcome: Progressing Goal: Signs and symptoms of infection will decrease Outcome: Progressing   Problem: Respiratory: Goal: Ability to maintain adequate ventilation will improve Outcome: Progressing   Problem: Education: Goal: Knowledge of General Education information will improve Description: Including pain rating scale, medication(s)/side effects and non-pharmacologic comfort measures Outcome: Progressing

## 2024-02-10 ENCOUNTER — Inpatient Hospital Stay (HOSPITAL_COMMUNITY)

## 2024-02-10 ENCOUNTER — Other Ambulatory Visit: Payer: Self-pay

## 2024-02-10 DIAGNOSIS — Z1611 Resistance to penicillins: Secondary | ICD-10-CM | POA: Diagnosis not present

## 2024-02-10 DIAGNOSIS — B957 Other staphylococcus as the cause of diseases classified elsewhere: Secondary | ICD-10-CM | POA: Diagnosis not present

## 2024-02-10 DIAGNOSIS — C61 Malignant neoplasm of prostate: Secondary | ICD-10-CM | POA: Diagnosis not present

## 2024-02-10 DIAGNOSIS — N178 Other acute kidney failure: Secondary | ICD-10-CM | POA: Diagnosis not present

## 2024-02-10 DIAGNOSIS — I10 Essential (primary) hypertension: Secondary | ICD-10-CM | POA: Diagnosis not present

## 2024-02-10 DIAGNOSIS — N390 Urinary tract infection, site not specified: Secondary | ICD-10-CM | POA: Diagnosis not present

## 2024-02-10 DIAGNOSIS — R7881 Bacteremia: Secondary | ICD-10-CM | POA: Diagnosis not present

## 2024-02-10 DIAGNOSIS — M25461 Effusion, right knee: Secondary | ICD-10-CM | POA: Insufficient documentation

## 2024-02-10 LAB — GLUCOSE, CAPILLARY
Glucose-Capillary: 157 mg/dL — ABNORMAL HIGH (ref 70–99)
Glucose-Capillary: 120 mg/dL — ABNORMAL HIGH (ref 70–99)
Glucose-Capillary: 163 mg/dL — ABNORMAL HIGH (ref 70–99)
Glucose-Capillary: 143 mg/dL — ABNORMAL HIGH (ref 70–99)

## 2024-02-10 LAB — BASIC METABOLIC PANEL WITH GFR
Anion gap: 14 (ref 5–15)
BUN: 28 mg/dL — ABNORMAL HIGH (ref 8–23)
CO2: 21 mmol/L — ABNORMAL LOW (ref 22–32)
Calcium: 9.6 mg/dL (ref 8.9–10.3)
Chloride: 100 mmol/L (ref 98–111)
Creatinine, Ser: 1.72 mg/dL — ABNORMAL HIGH (ref 0.61–1.24)
GFR, Estimated: 40 mL/min — ABNORMAL LOW (ref >60)
Glucose, Bld: 156 mg/dL — ABNORMAL HIGH (ref 70–99)
Potassium: 4.2 mmol/L (ref 3.5–5.1)
Sodium: 136 mmol/L (ref 135–145)

## 2024-02-10 LAB — CBC
HCT: 36.2 % — ABNORMAL LOW (ref 39.0–52.0)
Hemoglobin: 11.7 g/dL — ABNORMAL LOW (ref 13.0–17.0)
MCH: 30.2 pg (ref 26.0–34.0)
MCHC: 32.3 g/dL (ref 30.0–36.0)
MCV: 93.5 fL (ref 80.0–100.0)
Platelets: 377 K/uL (ref 150–400)
RBC: 3.87 MIL/uL — ABNORMAL LOW (ref 4.22–5.81)
RDW: 13.5 % (ref 11.5–15.5)
WBC: 16.8 K/uL — ABNORMAL HIGH (ref 4.0–10.5)
nRBC: 0 % (ref 0.0–0.2)

## 2024-02-10 LAB — SYNOVIAL CELL COUNT + DIFF, W/ CRYSTALS
Lymphocytes-Synovial Fld: 1 % (ref 0–20)
Monocyte-Macrophage-Synovial Fluid: 6 % — ABNORMAL LOW (ref 50–90)
Neutrophil, Synovial: 93 % — ABNORMAL HIGH (ref 0–25)
WBC, Synovial: 1470 /mm3 — ABNORMAL HIGH (ref 0–200)

## 2024-02-10 MED ORDER — DAPTOMYCIN IV (FOR PTA / DISCHARGE USE ONLY): 700.0000 mg | INTRAVENOUS | 0 refills | Status: DC

## 2024-02-10 MED ORDER — TRAMADOL HCL 50 MG PO TABS
50.0000 mg | ORAL_TABLET | Freq: Once | ORAL | Status: AC
  Administered 2024-02-10: 50 mg via ORAL
  Filled 2024-02-10: qty 1

## 2024-02-10 MED ORDER — LIDOCAINE HCL (PF) 1 % IJ SOLN
5.0000 mL | Freq: Once | INTRAMUSCULAR | Status: AC
  Administered 2024-02-10: 5 mL
  Filled 2024-02-10 (×2): qty 5

## 2024-02-10 MED ORDER — SODIUM CHLORIDE 0.9% FLUSH
10.0000 mL | INTRAVENOUS | Status: DC | PRN
  Administered 2024-02-13: 10 mL

## 2024-02-10 MED ORDER — OXYCODONE HCL 5 MG PO TABS
5.0000 mg | ORAL_TABLET | Freq: Four times a day (QID) | ORAL | Status: DC | PRN
  Administered 2024-02-10 – 2024-02-12 (×5): 5 mg via ORAL
  Filled 2024-02-10 (×5): qty 1

## 2024-02-10 NOTE — Plan of Care (Signed)
   Problem: Fluid Volume: Goal: Hemodynamic stability will improve Outcome: Progressing   Problem: Clinical Measurements: Goal: Diagnostic test results will improve Outcome: Progressing Goal: Signs and symptoms of infection will decrease Outcome: Progressing   Problem: Respiratory: Goal: Ability to maintain adequate ventilation will improve Outcome: Progressing

## 2024-02-10 NOTE — Progress Notes (Signed)
 Peripherally Inserted Central Catheter Placement  The IV Nurse has discussed with the patient and/or persons authorized to consent for the patient, the purpose of this procedure and the potential benefits and risks involved with this procedure.  The benefits include less needle sticks, lab draws from the catheter, and the patient may be discharged home with the catheter. Risks include, but not limited to, infection, bleeding, blood clot (thrombus formation), and puncture of an artery; nerve damage and irregular heartbeat and possibility to perform a PICC exchange if needed/ordered by physician.  Alternatives to this procedure were also discussed.  Bard Power PICC patient education guide, fact sheet on infection prevention and patient information card has been provided to patient /or left at bedside.    PICC Placement Documentation  PICC Single Lumen 02/10/24 Right Basilic 40 cm 0 cm (Active)  Indication for Insertion or Continuance of Line Home intravenous therapies (PICC only) 02/10/24 1847  Exposed Catheter (cm) 0 cm 02/10/24 1847  Site Assessment Clean, Dry, Intact 02/10/24 1847  Line Status Saline locked;Blood return noted 02/10/24 1847  Dressing Type Transparent;Securing device 02/10/24 1847  Dressing Status Antimicrobial disc/dressing in place;Clean, Dry, Intact 02/10/24 1847  Line Care Connections checked and tightened 02/10/24 1847  Line Adjustment (NICU/IV Team Only) No 02/10/24 1847  Dressing Intervention New dressing;Adhesive placed at insertion site (IV team only) 02/10/24 1847  Dressing Change Due 02/17/24 02/10/24 1847       Marcus Simpson 02/10/2024, 6:48 PM

## 2024-02-10 NOTE — Progress Notes (Addendum)
 PROGRESS NOTE    Marcus Simpson  FMW:[id number] DOB: Aug 11, 1945 DOA: 02/05/2024 PCP: [name], MD    Chief Complaint  Patient presents with   Fever    Brief Narrative:  78 year old male with history of hypertension, hyperlipidemia, diabetes mellitus type 2, prostate cancer, ureteral stone presented with nausea and vomiting after being started on treatment for UTI.  On presentation, WBC was 25.2, creatinine of 3.61, lactic acid of 3 which improved to 2.7.  UA suggestive of UTI.  CT showed findings suggestive of cystitis and 6 mm obstructing left ureteral stone stone.  Patient underwent cystoscopy and stent placement by urology.     Assessment & Plan:   Principal Problem:   Acute kidney injury due to urinary tract obstruction Active Problems:   Hyperlipidemia   Essential hypertension   Prostate cancer (HCC)   Bacteremia due to methicillin resistant Staphylococcus epidermidis   Acute pyelonephritis   Left ureteral calculus   Anemia of chronic disease   Well controlled type 2 diabetes mellitus (HCC)   Bilateral lower extremity edema   Severe sepsis (HCC)   Effusion of right knee joint  #1 severe sepsis secondary to UTI/acute pyelonephritis, MRSE bacteremia/obstructing left ureteral stone - Patient on admission met criteria for sepsis with tachycardia, tachypnea, significant leukocytosis, lactic acidosis, UTI with obstructing left ureteral stone. - Patient pancultured with blood cultures positive for MRSE. - Patient seen in consultation by urology underwent cystoscopy and left ureteral stent placement on 02/05/2024. - Urology recommended continuation of Foley catheter for 3 to 5 days with outpatient follow-up with urology. - Patient noted to be on IV Rocephin , patient noted to have been started on Zyvox  on 02/06/2024. -Patient started on IV Vancomycin .  -Zyvox  and Rocephin  discontinued. -IV Vanco discontinued and patient placed on IV daptomycin  per ID  recommendations. -Urology following. - ID following. - See problem #2.  #2 MRSE bacteremia -Blood cultures noted 4/4 positive for MRSE. -2D echo done negative for vegetations. - Patient seen in consultation by ID who reviewed blood cultures, sensitivities and recommended TEE to look for vegetation. - Per ID patient declined stating that he was 78 years old and did not want to undergo a TEE. - Per ID patient agreeable to empiric antibiotics for a course for endocarditis. - Repeat blood cultures obtained 02/07/2024 with no growth to date - Per ID, PICC line may be placed if repeat blood cultures from 02/07/2024 with no growth x 3 days. -PICC line ordered to be placed today. -IV vancomycin  has been changed to IV daptomycin  per ID who are recommending 6 weeks of IV antibiotics from negative blood cultures on 02/07/2024 with EOT 12/30/ 2025. - IV Rocephin  discontinued. - ID following and appreciate the input and recommendations.  3.  AKI/acute metabolic acidosis -Patient on presentation noted to have a creatinine of 3.61. - AKI likely postrenal azotemia secondary to obstructing left ureteral stone in the setting of Indocin. - Patient seen by urology underwent cystoscopy and left ureteral stent placement 02/05/2024. - Patient with a urine output of 3.7 L over the past 24 hours - Renal function slowly trending down creatinine down to 1.72 from 1.88 from 2.11 from as high as 3.61 on admission. - Last creatinine noted on 12/23/2023 at 1.46. - Patient noted with some lower extremity edema. -Patient seems to be auto diuresing. -Will hold off on Lasix  today. - Saline lock IV fluids. - Acidosis improving. - Continue bicarb tablets. - Avoid nephrotoxic agents.  4.  Large right suprapatellar  knee joint effusion - Patient noted with complaints of right knee pain today per RN affecting his ability to ambulate. - Plain films of the right knee were obtained which did show a large right suprapatellar  knee joint effusion. - Patient with MRSE bacteremia and on IV daptomycin . -Leukocytosis trending up. - Consult with orthopedics for further evaluation and management.  5. lactic acidosis -Resolved.  6.  Hypertension - Continue home regimen Toprol -XL.  7.  Hyperlipidemia - Continue statin.  8.  Prostate cancer -Continue tamsulosin . - Outpatient follow-up with oncology and radiation oncology.  9.  Anemia of chronic disease -Hemoglobin stable at 11.7.  10.  Well-controlled diabetes mellitus type 2 -Hemoglobin A1c 6.4 (12/23/2023). -CBG 157 this morning. - Patient noted to be on glipizide prior to admission which is currently on hold. - Continue SSI.   11.  Lower extremity edema -Patient with noted lower extremity edema. - Patient noted to be on IV fluids. -IV fluids discontinued. -Patient seems to be auto diuresing with urine output of 3.7 L over the past 24 hours. -Hold off on diuretics for now. -Follow urine output.  12.  Obesity class I -BMI 34.71 kg/m. - Lifestyle modification. - Outpatient follow-up with PCP.   DVT prophylaxis: Heparin  Code Status: Full Family Communication: Updated patient.  Updated wife at bedside.   Disposition: Home when clinically improved, PICC line placed, cleared by ID and urology.  Likely home on Saturday, 02/11/2024  Status is: Inpatient Remains inpatient appropriate because: Severity of illness   Consultants:  Urology: Dr. Shane 02/05/2024 ID: Dr. Dennise 02/07/2024 Orthopedics pending  Procedures:  CT abdomen and pelvis 02/05/2024 Chest x-ray 02/05/2024 2D echo 02/07/2024 Cystoscopy, left retrograde pyelogram with interpretation, left ureteral stent placement by urology: Dr. Shane 02/05/2024 Plain films of the right knee 02/10/2024 PICC line pending  Antimicrobials:  Anti-infectives (From admission, onward)    Start     Dose/Rate Route Frequency Ordered Stop   02/10/24 0000  daptomycin  (CUBICIN ) IVPB  Status:   Discontinued        700 mg Intravenous Every 24 hours 02/10/24 0915 02/10/24    02/10/24 0000  daptomycin  (CUBICIN ) IVPB  Status:  Discontinued        700 mg Intravenous Every 24 hours 02/10/24 0920 02/10/24    02/10/24 0000  daptomycin  (CUBICIN ) IVPB  Status:  Discontinued        700 mg Intravenous Every 24 hours 02/10/24 1110 02/10/24    02/10/24 0000  daptomycin  (CUBICIN ) IVPB        700 mg Intravenous Every 24 hours 02/10/24 1224 03/20/24 2359   02/09/24 1530  DAPTOmycin  (CUBICIN ) IVPB 700 mg/129mL premix        8 mg/kg  89.3 kg (Adjusted) 200 mL/hr over 30 Minutes Intravenous Daily 02/09/24 1442     02/08/24 0930  vancomycin  (VANCOREADY) IVPB 750 mg/150 mL  Status:  Discontinued        750 mg 150 mL/hr over 60 Minutes Intravenous Every 12 hours 02/08/24 0843 02/09/24 1442   02/07/24 1545  vancomycin  (VANCOREADY) IVPB 1500 mg/300 mL        1,500 mg 150 mL/hr over 120 Minutes Intravenous  Once 02/07/24 1452 02/07/24 1833   02/07/24 1502  vancomycin  variable dose per unstable renal function (pharmacist dosing)  Status:  Discontinued         Does not apply See admin instructions 02/07/24 1502 02/08/24 0844   02/07/24 0330  linezolid  (ZYVOX ) IVPB 600 mg  Status:  Discontinued  600 mg 300 mL/hr over 60 Minutes Intravenous Every 12 hours 02/06/24 1432 02/07/24 1452   02/06/24 1530  linezolid  (ZYVOX ) IVPB 600 mg        600 mg 300 mL/hr over 60 Minutes Intravenous STAT 02/06/24 1432 02/06/24 1852   02/06/24 1500  cefTRIAXone  (ROCEPHIN ) 2 g in sodium chloride  0.9 % 100 mL IVPB  Status:  Discontinued        2 g 200 mL/hr over 30 Minutes Intravenous Every 24 hours 02/05/24 2203 02/08/24 1049   02/05/24 1430  cefTRIAXone  (ROCEPHIN ) 2 g in sodium chloride  0.9 % 100 mL IVPB        2 g 200 mL/hr over 30 Minutes Intravenous Once 02/05/24 1426 02/05/24 1542         Subjective: Patient sitting up in recliner.  Patient with complaints of right knee pain with difficulty ambulating.   Patient with some lower extremity edema that has improved.  Patient denies any chest pain or shortness of breath.  Patient refusing MRI of the right knee for further evaluation and states he will manage.  Patient concerned about cost of MRI.    Objective: Vitals:   02/09/24 2203 02/10/24 0500 02/10/24 0537 02/10/24 1420  BP: (!) 181/68  (!) 156/79 137/66  Pulse: 85  92 82  Resp: 18  18   Temp: 98.5 F (36.9 C)  99.3 F (37.4 C) 100.2 F (37.9 C)  TempSrc: Oral  Oral Oral  SpO2: 95%  97% 96%  Weight:  110.4 kg    Height:        Intake/Output Summary (Last 24 hours) at 02/10/2024 1630 Last data filed at 02/10/2024 1220 Gross per 24 hour  Intake 320 ml  Output 2850 ml  Net -2530 ml   Filed Weights   02/08/24 0500 02/09/24 0500 02/10/24 0500  Weight: 112.9 kg 110.4 kg 110.4 kg    Examination:  General exam: NAD. Respiratory system: Lungs clear to auscultation bilaterally.  No wheezes, no crackles, no rhonchi.  Fair air movement.  Speaking in full sentences.  Cardiovascular system: RRR no murmurs rubs or gallops.  No JVD.  1-2+ bilateral lower extremity edema.  Gastrointestinal system: Abdomen is soft, nontender, nondistended, positive bowel sounds.  No rebound.  No guarding.  Central nervous system: Alert and oriented. No focal neurological deficits. Extremities: 1-2+ bilateral lower extremity edema.  Right knee swelling, with tenderness to palpation, no significant erythema, no significant warmth. Skin: No rashes, lesions or ulcers Psychiatry: Judgement and insight appear normal. Mood & affect appropriate.     Data Reviewed: I have personally reviewed following labs and imaging studies  CBC: Recent Labs  Lab 02/05/24 1443 02/06/24 0045 02/07/24 0545 02/08/24 0537 02/09/24 0558 02/10/24 0524  WBC 25.2* 21.8* 17.7* 13.0* 12.9* 16.8*  NEUTROABS 22.9*  --  14.7* 9.3* 8.4*  --   HGB 11.8* 10.6* 10.6* 11.0* 11.0* 11.7*  HCT 34.7* 32.2* 31.7* 33.5* 34.1* 36.2*  MCV  91.6 94.4 91.1 93.1 93.4 93.5  PLT 257 241 294 317 314 377    Basic Metabolic Panel: Recent Labs  Lab 02/05/24 2233 02/06/24 0045 02/07/24 0545 02/08/24 0537 02/09/24 0558 02/10/24 0524  NA  --  135 133* 135 138 136  K  --  4.9 4.4 4.0 4.2 4.2  CL  --  101 97* 101 104 100  CO2  --  21* 22 19* 19* 21*  GLUCOSE  --  181* 136* 139* 158* 156*  BUN  --  39* 48*  46* 39* 28*  CREATININE  --  3.19* 2.50* 2.11* 1.88* 1.72*  CALCIUM   --  8.5* 9.0 8.8* 9.3 9.6  MG 1.8  --  2.1 2.2  --   --     GFR: Estimated Creatinine Clearance: 43.3 mL/min (A) (by C-G formula based on SCr of 1.72 mg/dL (H)).  Liver Function Tests: Recent Labs  Lab 02/05/24 1443  AST 19  ALT 12  ALKPHOS 92  BILITOT 1.1  PROT 7.6  ALBUMIN 3.9    CBG: Recent Labs  Lab 02/09/24 1142 02/09/24 1636 02/10/24 0729 02/10/24 1150 02/10/24 1524  GLUCAP 143* 157* 157* 120* 163*     Recent Results (from the past 240 hours)  MIC (1 Drug)-Blood culture; 02/05/2024; BLOOD RIGHT FOREARM; MRSE; Daptomycin      Status: Abnormal   Collection Time: 02/05/24 10:31 AM   Specimen: BLOOD RIGHT FOREARM  Result Value Ref Range Status   Min Inhibitory Conc (1 Drug) Preliminary report (A)  Final    Comment: (NOTE) Performed At: [facility] [employer] [address] [place], [place] [id number] Jennette Shorter MD Ey:[id number]    Source BLOOD  Final    Comment: Performed at [facility], [address], [place], [place] [address]  MIC Result     Status: Abnormal   Collection Time: 02/05/24 10:31 AM  Result Value Ref Range Status   Result 1 (MIC) Comment (A)  Final    Comment: (NOTE) Methicillin - resistant Staphylococcus aureus Identification performed by account, not confirmed by this laboratory. Daptomycin  Performed At: [facility] [address] [place], [place] [id number] Jennette Shorter MD Ey:[id number]   Resp panel by RT-PCR (RSV, Flu A&B, Covid) Anterior Nasal Swab     Status: None   Collection  Time: 02/05/24  1:58 PM   Specimen: Anterior Nasal Swab  Result Value Ref Range Status   SARS Coronavirus 2 by RT PCR NEGATIVE NEGATIVE Final    Comment: (NOTE) SARS-CoV-2 target nucleic acids are NOT DETECTED.  The SARS-CoV-2 RNA is generally detectable in upper respiratory specimens during the acute phase of infection. The lowest concentration of SARS-CoV-2 viral copies this assay can detect is 138 copies/mL. A negative result does not preclude SARS-Cov-2 infection and should not be used as the sole basis for treatment or other patient management decisions. A negative result may occur with  improper specimen collection/handling, submission of specimen other than nasopharyngeal swab, presence of viral mutation(s) within the areas targeted by this assay, and inadequate number of viral copies(<138 copies/mL). A negative result must be combined with clinical observations, patient history, and epidemiological information. The expected result is Negative.  Fact Sheet for Patients:  bloggercourse.com  Fact Sheet for Healthcare Providers:  seriousbroker.it  This test is no t yet approved or cleared by the [place]  FDA and  has been authorized for detection and/or diagnosis of SARS-CoV-2 by FDA under an Emergency Use Authorization (EUA). This EUA will remain  in effect (meaning this test can be used) for the duration of the COVID-19 declaration under Section 564(b)(1) of the Act, 21 U.S.C.section 360bbb-3(b)(1), unless the authorization is terminated  or revoked sooner.       Influenza A by PCR NEGATIVE NEGATIVE Final   Influenza B by PCR NEGATIVE NEGATIVE Final    Comment: (NOTE) The Xpert Xpress SARS-CoV-2/FLU/RSV plus assay is intended as an aid in the diagnosis of influenza from Nasopharyngeal swab specimens and should not be used as a sole basis for treatment. Nasal washings and aspirates  are unacceptable for Xpert Xpress  SARS-CoV-2/FLU/RSV testing.  Fact Sheet for Patients: bloggercourse.com  Fact Sheet for Healthcare Providers: seriousbroker.it  This test is not yet approved or cleared by the [place]  FDA and has been authorized for detection and/or diagnosis of SARS-CoV-2 by FDA under an Emergency Use Authorization (EUA). This EUA will remain in effect (meaning this test can be used) for the duration of the COVID-19 declaration under Section 564(b)(1) of the Act, 21 U.S.C. section 360bbb-3(b)(1), unless the authorization is terminated or revoked.     Resp Syncytial Virus by PCR NEGATIVE NEGATIVE Final    Comment: (NOTE) Fact Sheet for Patients: bloggercourse.com  Fact Sheet for Healthcare Providers: seriousbroker.it  This test is not yet approved or cleared by the [place]  FDA and has been authorized for detection and/or diagnosis of SARS-CoV-2 by FDA under an Emergency Use Authorization (EUA). This EUA will remain in effect (meaning this test can be used) for the duration of the COVID-19 declaration under Section 564(b)(1) of the Act, 21 U.S.C. section 360bbb-3(b)(1), unless the authorization is terminated or revoked.  Performed at [facility], [address], [place], [place] [address]   Urine Culture     Status: Abnormal   Collection Time: 02/05/24  2:05 PM   Specimen: Urine, Random  Result Value Ref Range Status   Specimen Description   Final    URINE, RANDOM Performed at [facility], [address], [place], [place] [address]    Special Requests   Final    NONE Reflexed from [id number] Performed at [facility], [address] [place], [place] [address]    Culture MULTIPLE SPECIES PRESENT, SUGGEST RECOLLECTION (A)  Final   Report Status 02/07/2024 FINAL  Final  Blood Culture (routine x 2)     Status: Abnormal (Preliminary result)   Collection Time: 02/05/24  2:09 PM    Specimen: BLOOD  Result Value Ref Range Status   Specimen Description   Final    BLOOD BLOOD RIGHT FOREARM Performed at [facility], [address], [place], [place] [address]    Special Requests   Final    BOTTLES DRAWN AEROBIC AND ANAEROBIC Blood Culture adequate volume Performed at [facility], [address] [place], [place] [address]    Culture  Setup Time   Final    GRAM POSITIVE COCCI IN BOTH AEROBIC AND ANAEROBIC BOTTLES Gram Stain Report Called to,Read Back By and Verified With: L SAUNDERS AT [facility] AT 0812 ON [id number] BY S DALTON    Culture (A)  Final    STAPHYLOCOCCUS EPIDERMIDIS Sent to [facility] for further susceptibility testing. Performed at [facility], [address], [place], [place] [address]    Report Status PENDING  Incomplete   Organism ID, Bacteria STAPHYLOCOCCUS EPIDERMIDIS  Final      Susceptibility   Staphylococcus epidermidis - MIC*    CIPROFLOXACIN  >=8 RESISTANT Resistant     ERYTHROMYCIN >=8 RESISTANT Resistant     GENTAMICIN  <=0.5 SENSITIVE Sensitive     OXACILLIN >=4 RESISTANT Resistant     TETRACYCLINE 2 SENSITIVE Sensitive     VANCOMYCIN  1 SENSITIVE Sensitive     TRIMETH/SULFA 80 RESISTANT Resistant     CLINDAMYCIN >=8 RESISTANT Resistant     RIFAMPIN <=0.5 SENSITIVE Sensitive     Inducible Clindamycin NEGATIVE Sensitive     * STAPHYLOCOCCUS EPIDERMIDIS  Blood Culture (routine x 2)     Status: Abnormal   Collection Time: 02/05/24  2:43 PM   Specimen: BLOOD  Result Value Ref Range Status   Specimen Description   Final    BLOOD BLOOD LEFT ARM Performed at [facility], [address], [place], [place] [address]    Special Requests   Final    BOTTLES DRAWN AEROBIC AND ANAEROBIC Blood Culture adequate volume Performed at [facility], [address], [place], [place] [address]    Culture  Setup Time   Final    GRAM POSITIVE COCCI IN BOTH AEROBIC AND ANAEROBIC BOTTLES Gram Stain Report Called to,Read Back By and Verified With: L SAUNDERS  AT [facility] AT 0812 ON [id number] BY S DALTON CRITICAL RESULT CALLED TO, READ BACK BY AND VERIFIED WITH: PHARMD DREW WOFFORD ON 02/06/24 @ 1517 BY DRT Performed at [facility], [address] [place], [place] [address]    Culture STAPHYLOCOCCUS EPIDERMIDIS (A)  Final   Report Status 02/08/2024 FINAL  Final   Organism ID, Bacteria STAPHYLOCOCCUS EPIDERMIDIS  Final      Susceptibility   Staphylococcus epidermidis - MIC*    CIPROFLOXACIN  >=8 RESISTANT Resistant     ERYTHROMYCIN >=8 RESISTANT Resistant     GENTAMICIN  <=0.5 SENSITIVE Sensitive     OXACILLIN >=4 RESISTANT Resistant     TETRACYCLINE 2 SENSITIVE Sensitive     VANCOMYCIN  1 SENSITIVE Sensitive     TRIMETH/SULFA 160 RESISTANT Resistant     CLINDAMYCIN >=8 RESISTANT Resistant     RIFAMPIN <=0.5 SENSITIVE Sensitive     Inducible Clindamycin NEGATIVE Sensitive     * STAPHYLOCOCCUS EPIDERMIDIS  Blood Culture ID Panel (Reflexed)     Status: Abnormal   Collection Time: 02/05/24  2:43 PM  Result Value Ref Range Status   Enterococcus faecalis NOT DETECTED NOT DETECTED Final   Enterococcus Faecium NOT DETECTED NOT DETECTED Final   Listeria monocytogenes NOT DETECTED NOT DETECTED Final   Staphylococcus species DETECTED (A) NOT DETECTED Final    Comment: CRITICAL RESULT CALLED TO, READ BACK BY AND VERIFIED WITH: PHARMD DREW WOFFORD ON 02/06/24 @ 1517 BY DRT    Staphylococcus aureus (BCID) NOT DETECTED NOT DETECTED Final   Staphylococcus epidermidis DETECTED (A) NOT DETECTED Final    Comment: Methicillin (oxacillin) resistant coagulase negative staphylococcus. Possible blood culture contaminant (unless isolated from more than one blood culture draw or clinical case suggests pathogenicity). No antibiotic treatment is indicated for blood  culture contaminants. CRITICAL RESULT CALLED TO, READ BACK BY AND VERIFIED WITH: PHARMD DREW WOFFORD ON 02/06/24 @ 1517 BY DRT    Staphylococcus lugdunensis NOT DETECTED NOT DETECTED Final    Streptococcus species NOT DETECTED NOT DETECTED Final   Streptococcus agalactiae NOT DETECTED NOT DETECTED Final   Streptococcus pneumoniae NOT DETECTED NOT DETECTED Final   Streptococcus pyogenes NOT DETECTED NOT DETECTED Final   A.calcoaceticus-baumannii NOT DETECTED NOT DETECTED Final   Bacteroides fragilis NOT DETECTED NOT DETECTED Final   Enterobacterales NOT DETECTED NOT DETECTED Final   Enterobacter cloacae complex NOT DETECTED NOT DETECTED Final   Escherichia coli NOT DETECTED NOT DETECTED Final   Klebsiella aerogenes NOT DETECTED NOT DETECTED Final   Klebsiella oxytoca NOT DETECTED NOT DETECTED Final   Klebsiella pneumoniae NOT DETECTED NOT DETECTED Final   Proteus species NOT DETECTED NOT DETECTED Final   Salmonella species NOT DETECTED NOT DETECTED Final   Serratia marcescens NOT DETECTED NOT DETECTED Final   Haemophilus influenzae NOT DETECTED NOT DETECTED Final   Neisseria meningitidis NOT DETECTED NOT DETECTED Final   Pseudomonas aeruginosa NOT DETECTED NOT DETECTED Final   Stenotrophomonas maltophilia NOT  DETECTED NOT DETECTED Final   Candida albicans NOT DETECTED NOT DETECTED Final   Candida auris NOT DETECTED NOT DETECTED Final   Candida glabrata NOT DETECTED NOT DETECTED Final   Candida krusei NOT DETECTED NOT DETECTED Final   Candida parapsilosis NOT DETECTED NOT DETECTED Final   Candida tropicalis NOT DETECTED NOT DETECTED Final   Cryptococcus neoformans/gattii NOT DETECTED NOT DETECTED Final   Methicillin resistance mecA/C DETECTED (A) NOT DETECTED Final    Comment: CRITICAL RESULT CALLED TO, READ BACK BY AND VERIFIED WITH: PHARMD DREW WOFFORD ON 02/06/24 @ 1517 BY DRT Performed at [facility], [address], [place], [place] [address]   Urine Culture     Status: Abnormal   Collection Time: 02/05/24  8:41 PM   Specimen: Urine, Cystoscope  Result Value Ref Range Status   Specimen Description   Final    URINE, RANDOM Performed at [facility], [address], [place], [place] [address]    Special Requests   Final    NONE Performed at [facility], [address], [place], [place] [address]    Culture MULTIPLE SPECIES PRESENT, SUGGEST RECOLLECTION (A)  Final   Report Status 02/07/2024 FINAL  Final  Culture, blood (Routine X 2) w Reflex to ID Panel     Status: None (Preliminary result)   Collection Time: 02/07/24 11:23 AM   Specimen: BLOOD RIGHT HAND  Result Value Ref Range Status   Specimen Description BLOOD RIGHT HAND  Final   Special Requests   Final    BOTTLES DRAWN AEROBIC ONLY Blood Culture results may not be optimal due to an inadequate volume of blood received in culture bottles   Culture   Final    NO GROWTH 3 DAYS Performed at [facility], [address], [place], [place] [address]    Report Status PENDING  Incomplete  Culture, blood (Routine X 2) w Reflex to ID Panel     Status: None (Preliminary result)   Collection Time: 02/07/24 11:27 AM   Specimen: BLOOD RIGHT HAND  Result Value Ref Range Status   Specimen Description BLOOD RIGHT HAND  Final   Special Requests   Final    BOTTLES DRAWN AEROBIC ONLY Blood Culture results may not be optimal due to an inadequate volume of blood received in culture bottles   Culture   Final    NO GROWTH 3 DAYS Performed at [facility], [address], [place], [place] [address]    Report Status PENDING  Incomplete         Radiology Studies: DG Knee Complete 4 Views Right Result Date: 02/10/2024 EXAM: 4 OR MORE VIEW(S) XRAY OF THE KNEE 02/10/2024 10:42:00 AM COMPARISON: None available. CLINICAL HISTORY: 136[address] Swelling [phone] FINDINGS: BONES AND JOINTS: No acute fracture. No focal osseous lesion. No joint dislocation. Mild tri-compartment degenerative changes. Chondrocalcinosis. Large suprapatellar knee joint effusion . SOFT TISSUES: The soft tissues are unremarkable. IMPRESSION: 1. Large suprapatellar knee joint effusion. 2.  Tricompartmental osteoarthritis. Electronically signed by: Waddell Calk MD 02/10/2024 02:07 PM EST RP Workstation: [id number]   US  EKG SITE RITE Result Date: 02/10/2024 If Site Rite image not attached, placement could not be confirmed due to current cardiac rhythm.  US  EKG SITE RITE Result Date: 02/10/2024 If Site Rite image not attached, placement could not be confirmed due to current cardiac rhythm.        Scheduled Meds:  Chlorhexidine  Gluconate Cloth  6 each Topical  Q0600   cyanocobalamin   1,000 mcg Oral Daily   heparin   5,000 Units Subcutaneous Q8H   insulin  aspart  0-5 Units Subcutaneous QHS   insulin  aspart  0-6 Units Subcutaneous TID WC   metoprolol  succinate  25 mg Oral Daily   [START ON 02/11/2024] rosuvastatin   5 mg Oral Q Sat   senna-docusate  1 tablet Oral BID   sodium bicarbonate   650 mg Oral BID   sodium chloride  flush  3 mL Intravenous Q12H   tamsulosin   0.4 mg Oral Daily   triamcinolone  cream  1 Application Topical BID   Continuous Infusions:  DAPTOmycin  700 mg (02/10/24 1459)     LOS: 5 days    Time spent: 40 minutes    Toribio Hummer, MD [facility]   To contact the attending provider between 7A-7P or the covering provider during after hours 7P-7A, please log into the web site [email or web address] and access using universal [place] password for that web site. If you do not have the password, please call the hospital operator.  02/10/2024, 4:30 PM

## 2024-02-10 NOTE — Progress Notes (Signed)
 Physical Therapy Treatment Patient Details Name: Marcus Simpson MRN: [id number] DOB: 1946/01/03 Today's Date: 02/10/2024   History of Present Illness 78 yo male admitted with acute kidney injury, urinary tract obstruction. s/p ureteral stent 02/05/24.  Hx of DM, prostate Ca, ureteral stone.R knee x-ray 02/10/2024  Large suprapatellar knee joint effusion.  Tricompartmental osteoarthritis.    PT Comments   Pt admitted with above diagnosis.  Pt currently with functional limitations due to the deficits listed below (see PT Problem List). New dx as above with R knee pain limiting mobility, no current change in R LE WB recommendations. Pt in recliner when PT returned later in the day. Pt stated he wanted to get in bed. PT set up for transfer tasks, donned pt shoes, min A and pt able to take 2-3 steps toward bed with RW and then reported the pain was too much requiring PT to place recliner behind pt and guide to recliner. PT expressed concern per anticipation of increased pain and difficulty with transfer tasks as the day progresses and encouraged pt to transfer recliner to bed with PT. Pt declined, stating that it was too painful for his R knee and that he did not want to try it again. Pt left seated in recliner, all needs in place and CP to R knee. Pending pt progression with mobility tasks HH vs SNF.  Pt will benefit from acute skilled PT to increase their independence and safety with mobility to allow discharge.      If plan is discharge home, recommend the following: Two people to help with walking and/or transfers;A lot of help with bathing/dressing/bathroom;Assistance with cooking/housework;Assist for transportation;Help with stairs or ramp for entrance   Can travel by private vehicle        Equipment Recommendations  None recommended by PT    Recommendations for Other Services       Precautions / Restrictions Precautions Precautions: Fall Restrictions Weight Bearing Restrictions Per  Provider Order:  (pt self limiting R LE due to knee pain no acute findings on imaging)     Mobility  Bed Mobility               General bed mobility comments: oob in recliner    Transfers Overall transfer level: Needs assistance Equipment used: Rolling walker (2 wheels) Transfers: Sit to/from Stand Sit to Stand: Min assist           General transfer comment: pt seated in recliner when PT arrived. pt requesting to get to bed, pt seated with standard socks donned, ed provided on proper and safe foot wear and PT donned B shoes, pt c/o R knee pain, min A for sit to stand from recliner and pt initiating pivoting to bed x 2-3 steps with use of RW and pt reported he needed to sit back down, PT placed recliner behind pt and guided pt to recliner. PT provided ed on anticipation of increased difficulty with transfer from recliner with prolonged sitting. pt indicated that he was not going to try and transfer to the bed again due to pain. pt declined second trial for transfer    Ambulation/Gait               General Gait Details: NT due to R knee pain   Stairs             Wheelchair Mobility     Tilt Bed    Modified Rankin (Stroke Patients Only)       Balance Overall  balance assessment: Mild deficits observed, not formally tested                                          Communication Communication Communication: No apparent difficulties  Cognition Arousal: Alert Behavior During Therapy: WFL for tasks assessed/performed   PT - Cognitive impairments: No apparent impairments                         Following commands: Intact      Cueing Cueing Techniques: Verbal cues  Exercises      General Comments        Pertinent Vitals/Pain Pain Assessment Pain Assessment: Faces Faces Pain Scale: Hurts worst Pain Location: R knee Pain Descriptors / Indicators: Aching, Constant, Discomfort, Grimacing, Guarding Pain Intervention(s):  Limited activity within patient's tolerance, Monitored during session, Premedicated before session, Repositioned, Ice applied    Home Living                          Prior Function            PT Goals (current goals can now be found in the care plan section) Acute Rehab PT Goals Patient Stated Goal: home soon PT Goal Formulation: With patient Time For Goal Achievement: 02/21/24 Potential to Achieve Goals: Good Progress towards PT goals: Not progressing toward goals - comment    Frequency    Min 3X/week      PT Plan      Co-evaluation              AM-PAC PT 6 Clicks Mobility   Outcome Measure  Help needed turning from your back to your side while in a flat bed without using bedrails?: None Help needed moving from lying on your back to sitting on the side of a flat bed without using bedrails?: None Help needed moving to and from a bed to a chair (including a wheelchair)?: A Lot Help needed standing up from a chair using your arms (e.g., wheelchair or bedside chair)?: A Lot Help needed to walk in hospital room?: Total Help needed climbing 3-5 steps with a railing? : Total 6 Click Score: 14    End of Session   Activity Tolerance: Patient limited by pain Patient left: in chair;with call bell/phone within reach Nurse Communication: Mobility status;Patient requests pain meds (pt reporting pain medication prior to PT arrival) PT Visit Diagnosis: Muscle weakness (generalized) (M62.81);Unsteadiness on feet (R26.81)     Time: 8462-8443 PT Time Calculation (min) (ACUTE ONLY): 19 min  Charges:    $Therapeutic Activity: 8-22 mins PT General Charges $$ ACUTE PT VISIT: 1 Visit                     [place], PT Acute Rehab    [place] VEAR Drone 02/10/2024, 4:35 PM

## 2024-02-10 NOTE — Progress Notes (Signed)
 PT Cancellation Note  Patient Details Name: Marcus Simpson MRN: [id number] DOB: [birth year]   Cancelled Treatment:    Reason Eval/Treat Not Completed: Pain limiting ability to participate. Pt presents with R knee pain with calor, mild rubor and TTP medial and distal to R knee joint line, B LE edema R > L. Imaging of R knee ordered. PT to hold intervention until imaging is complete and reviewed. PT to return as schedule allows and continue to follow acutely.   [place], PT Acute Rehab   [place] VEAR Drone 02/10/2024, 11:39 AM

## 2024-02-10 NOTE — Consult Note (Signed)
 Reason for Consult: Right knee effusion and pain Referring Physician: [facility]  Marcus Simpson is an 78 y.o. male.  HPI: Marcus Simpson is currently admitted to the hospitalist service for severe sepsis and bacteremia.  He is on antibiotic regimen through infectious disease.  He says over the last 3 to 4 days he has developed increasing right knee pain and swelling.  He is notably swollen today and he is having difficulty with weightbearing.  He does report a history of bilateral knee osteoarthritis with the left side being replaced.  He does feel that his symptoms are different than his typical arthritis pain.  Orthopedics is consulted for right knee pain and swelling, notably with concern of septic arthritis in his setting.   Past Medical History:  Diagnosis Date   Arthritis    Bradycardia    asymptomatic    Diabetes mellitus without complication (HCC)    History of kidney stones    Hypertension    Prostate cancer [facility])     Past Surgical History:  Procedure Laterality Date   COLONOSCOPY     CYSTOSCOPY WITH STENT PLACEMENT Right 12/28/2022   Procedure: CYSTOSCOPY WITH STENT PLACEMENT;  Surgeon: Carolee Sherwood BIRCH DOUGLAS, MD;  Location: [facility] OR;  Service: Urology;  Laterality: Right;   CYSTOSCOPY WITH STENT PLACEMENT Left 02/05/2024   Procedure: CYSTOSCOPY, WITH STENT INSERTION. URETHRAL DILATION;  Surgeon: Shane Steffan BROCKS, MD;  Location: [facility] ORS;  Service: Urology;  Laterality: Left;   EXTRACORPOREAL SHOCK WAVE LITHOTRIPSY Right 01/25/2023   Procedure: EXTRACORPOREAL SHOCK WAVE LITHOTRIPSY (ESWL);  Surgeon: [name], MD;  Location: [facility] ORS;  Service: Urology;  Laterality: Right;   FLEXIBLE SIGMOIDOSCOPY N/A 04/02/2015   Procedure: FLEXIBLE SIGMOIDOSCOPY;  Surgeon: Claudis RAYMOND Rivet, MD;  Location: [facility] ENDO SUITE;  Service: Endoscopy;  Laterality: N/A;  1:45 - moved to 1/11 @ 8:30 - Ann to notify   KIDNEY STONE SURGERY     RADIOACTIVE SEED IMPLANT N/A 12/29/2023   Procedure:  INSERTION, RADIATION SOURCE, PROSTATE;  Surgeon: Sherrilee Belvie CROME, MD;  Location: [facility] ORS;  Service: Urology;  Laterality: N/A;   SPACE OAR INSTILLATION N/A 12/29/2023   Procedure: INJECTION, HYDROGEL SPACER;  Surgeon: Sherrilee Belvie CROME, MD;  Location: [facility] ORS;  Service: Urology;  Laterality: N/A;   TOTAL KNEE ARTHROPLASTY Left 05/02/2017   Procedure: LEFT TOTAL KNEE ARTHROPLASTY;  Surgeon: Melodi Lerner, MD;  Location: [facility] ORS;  Service: Orthopedics;  Laterality: Left;    History reviewed. No pertinent family history.  Social History:  reports that he has quit smoking. His smoking use included cigarettes. He has never used smokeless tobacco. He reports that he does not drink alcohol and does not use drugs.  Allergies:  Allergies  Allergen Reactions   Dilaudid [Hydromorphone Hcl] Anaphylaxis and Other (See Comments)    Irregular heart rate    Medications: I have reviewed the patient's current medications.  Results for orders placed or performed during the hospital encounter of 02/05/24 (from the past 48 hours)  Glucose, capillary     Status: Abnormal   Collection Time: 02/08/24  9:00 PM  Result Value Ref Range   Glucose-Capillary 114 (H) 70 - 99 mg/dL    Comment: Glucose reference range applies only to samples taken after fasting for at least 8 hours.  CBC with Differential/Platelet     Status: Abnormal   Collection Time: 02/09/24  5:58 AM  Result Value Ref Range   WBC 12.9 (H) 4.0 - 10.5 K/uL   RBC 3.65 (L)  4.22 - 5.81 MIL/uL   Hemoglobin 11.0 (L) 13.0 - 17.0 g/dL   HCT 65.8 (L) 60.9 - 47.9 %   MCV 93.4 80.0 - 100.0 fL   MCH 30.1 26.0 - 34.0 pg   MCHC 32.3 30.0 - 36.0 g/dL   RDW 86.4 88.4 - 84.4 %   Platelets 314 150 - 400 K/uL   nRBC 0.0 0.0 - 0.2 %   Neutrophils Relative % 64 %   Neutro Abs 8.4 (H) 1.7 - 7.7 K/uL   Lymphocytes Relative 18 %   Lymphs Abs 2.3 0.7 - 4.0 K/uL   Monocytes Relative 12 %   Monocytes Absolute 1.5 (H) 0.1 - 1.0 K/uL   Eosinophils Relative 4 %    Eosinophils Absolute 0.5 0.0 - 0.5 K/uL   Basophils Relative 1 %   Basophils Absolute 0.1 0.0 - 0.1 K/uL   Immature Granulocytes 1 %   Abs Immature Granulocytes 0.09 (H) 0.00 - 0.07 K/uL    Comment: Performed at [facility], [address], [place], [place] [address]  Basic metabolic panel     Status: Abnormal   Collection Time: 02/09/24  5:58 AM  Result Value Ref Range   Sodium 138 135 - 145 mmol/L   Potassium 4.2 3.5 - 5.1 mmol/L   Chloride 104 98 - 111 mmol/L   CO2 19 (L) 22 - 32 mmol/L   Glucose, Bld 158 (H) 70 - 99 mg/dL    Comment: Glucose reference range applies only to samples taken after fasting for at least 8 hours.   BUN 39 (H) 8 - 23 mg/dL   Creatinine, Ser 8.11 (H) 0.61 - 1.24 mg/dL   Calcium  9.3 8.9 - 10.3 mg/dL   GFR, Estimated 36 (L) >60 mL/min    Comment: (NOTE) Calculated using the CKD-EPI Creatinine Equation (2021)    Anion gap 15 5 - 15    Comment: Performed at [facility], [address] [place], [place] [address]  CK     Status: None   Collection Time: 02/09/24  5:58 AM  Result Value Ref Range   Total CK 55 49 - 397 U/L    Comment: Performed at [facility], [address], [place], [place] [address]  Glucose, capillary     Status: Abnormal   Collection Time: 02/09/24  8:00 AM  Result Value Ref Range   Glucose-Capillary 128 (H) 70 - 99 mg/dL    Comment: Glucose reference range applies only to samples taken after fasting for at least 8 hours.  Glucose, capillary     Status: Abnormal   Collection Time: 02/09/24 11:42 AM  Result Value Ref Range   Glucose-Capillary 143 (H) 70 - 99 mg/dL    Comment: Glucose reference range applies only to samples taken after fasting for at least 8 hours.  Glucose, capillary     Status: Abnormal   Collection Time: 02/09/24  4:36 PM  Result Value Ref Range   Glucose-Capillary 157 (H) 70 - 99 mg/dL    Comment: Glucose reference range applies only to samples taken  after fasting for at least 8 hours.  CBC     Status: Abnormal   Collection Time: 02/10/24  5:24 AM  Result Value Ref Range   WBC 16.8 (H) 4.0 - 10.5 K/uL   RBC 3.87 (L) 4.22 - 5.81 MIL/uL   Hemoglobin 11.7 (L) 13.0 - 17.0 g/dL   HCT 63.7 (L) 60.9 - 47.9 %   MCV 93.5 80.0 -  100.0 fL   MCH 30.2 26.0 - 34.0 pg   MCHC 32.3 30.0 - 36.0 g/dL   RDW 86.4 88.4 - 84.4 %   Platelets 377 150 - 400 K/uL   nRBC 0.0 0.0 - 0.2 %    Comment: Performed at [facility], [address] [place], [place] [address]  Basic metabolic panel     Status: Abnormal   Collection Time: 02/10/24  5:24 AM  Result Value Ref Range   Sodium 136 135 - 145 mmol/L   Potassium 4.2 3.5 - 5.1 mmol/L   Chloride 100 98 - 111 mmol/L   CO2 21 (L) 22 - 32 mmol/L   Glucose, Bld 156 (H) 70 - 99 mg/dL    Comment: Glucose reference range applies only to samples taken after fasting for at least 8 hours.   BUN 28 (H) 8 - 23 mg/dL   Creatinine, Ser 8.27 (H) 0.61 - 1.24 mg/dL   Calcium  9.6 8.9 - 10.3 mg/dL   GFR, Estimated 40 (L) >60 mL/min    Comment: (NOTE) Calculated using the CKD-EPI Creatinine Equation (2021)    Anion gap 14 5 - 15    Comment: Performed at [facility], [address], [place], [place] [address]  Glucose, capillary     Status: Abnormal   Collection Time: 02/10/24  7:29 AM  Result Value Ref Range   Glucose-Capillary 157 (H) 70 - 99 mg/dL    Comment: Glucose reference range applies only to samples taken after fasting for at least 8 hours.  Glucose, capillary     Status: Abnormal   Collection Time: 02/10/24 11:50 AM  Result Value Ref Range   Glucose-Capillary 120 (H) 70 - 99 mg/dL    Comment: Glucose reference range applies only to samples taken after fasting for at least 8 hours.  Glucose, capillary     Status: Abnormal   Collection Time: 02/10/24  3:24 PM  Result Value Ref Range   Glucose-Capillary 163 (H) 70 - 99 mg/dL    Comment: Glucose reference range applies  only to samples taken after fasting for at least 8 hours.    DG Knee Complete 4 Views Right Result Date: 02/10/2024 EXAM: 4 OR MORE VIEW(S) XRAY OF THE KNEE 02/10/2024 10:42:00 AM COMPARISON: None available. CLINICAL HISTORY: 13689 Swelling [phone] FINDINGS: BONES AND JOINTS: No acute fracture. No focal osseous lesion. No joint dislocation. Mild tri-compartment degenerative changes. Chondrocalcinosis. Large suprapatellar knee joint effusion . SOFT TISSUES: The soft tissues are unremarkable. IMPRESSION: 1. Large suprapatellar knee joint effusion. 2. Tricompartmental osteoarthritis. Electronically signed by: Waddell Calk MD 02/10/2024 02:07 PM EST RP Workstation: [id number]   US  EKG SITE RITE Result Date: 02/10/2024 If Site Rite image not attached, placement could not be confirmed due to current cardiac rhythm.  US  EKG SITE RITE Result Date: 02/10/2024 If Site Rite image not attached, placement could not be confirmed due to current cardiac rhythm.   Review of Systems  Reason unable to perform ROS: Age.  Constitutional:  Positive for activity change.  HENT:  Negative for drooling and facial swelling.   Eyes:  Negative for discharge, redness and visual disturbance.  Musculoskeletal:  Positive for arthralgias and joint swelling.  Skin:  Negative for wound.  Neurological:  Negative for facial asymmetry and speech difficulty.  Psychiatric/Behavioral:  Negative for agitation.    Blood pressure 137/66, pulse 82, temperature 100.2 F (37.9 C), temperature source Oral, resp. rate 18, height 5' 11 (1.803 m), weight 110.4 kg,  SpO2 96%. Physical Exam Constitutional:      Appearance: He is ill-appearing.  HENT:     Head: Normocephalic and atraumatic.     Nose: Nose normal.     Mouth/Throat:     Mouth: Mucous membranes are moist.  Eyes:     General: No scleral icterus.    Extraocular Movements: Extraocular movements intact.  Pulmonary:     Effort: Pulmonary effort is normal.  Abdominal:      General: Abdomen is flat.  Musculoskeletal:     Comments: Right knee with significant effusion.  There is no notable overlying warmth or erythema.  He has notable tenderness to palpation about the knee worse at the joint line.  He has pain and wincing with attempted active and passive motion at the knee through mini-arc motion.  He is unable to do an active straight leg raise due to discomfort.  Calf is soft and nontender.  Exposed skin is otherwise benign.  Warm and well-perfused distally with intact sensation.  He has a prior well-healed surgical scar from total knee replacement on the contralateral side.  Skin:    General: Skin is warm and dry.  Neurological:     Mental Status: He is alert and oriented to person, place, and time.  Psychiatric:        Mood and Affect: Mood normal.        Behavior: Behavior normal.     Assessment/Plan: Right knee effusion and pain in the setting of sepsis and bacteremia   We discussed his findings today.  He does have a notable knee effusion and pain with any attempted motion.  He does not have any significant warmth or erythema however but again the effusion is significant.  He was agreeable to bedside aspiration today.  Aspirate did have a cloudy and hazy appearance.  Aspirate was sent for stat cell count, crystals, culture, and Gram stain.  We will follow for the results. We will make him n.p.o. after midnight tonight in the event this is septic arthritis and he may require going to the OR for washout.  He may be weightbearing as tolerated and  continue icing as needed.  Procedure: Risk, benefits, and alternatives to this procedure were discussed with the patient at length.  Informed verbal consent was obtained.  The right knee was sterilely prepped with Betadine and alcohol. Using strict sterile technique, 5 cc of 1% lidocaine  without epinephrine  were infiltrated down to knee joint using a superior lateral approach with the knee held in extension.  The needle  was withdrawn.  Next, a 1-1/2 inch 18-gauge needle was inserted into the right knee joint along the same track and 80 cc of cloudy hazy blood-tinged fluid was aspirated.  The needle was withdrawn.  A Band-Aid was applied.  The patient tolerated procedure well.  There were no immediate complications.  Postprocedure instructions and signs symptoms of concerning features were discussed.  Marcus Simpson 02/10/2024, 5:06 PM

## 2024-02-10 NOTE — Progress Notes (Signed)
 [facility]  Date of Admission:  02/05/2024   Total days of inpatient antibiotics 5  Principal Problem:   Acute kidney injury due to urinary tract obstruction Active Problems:   Hyperlipidemia   Essential hypertension   Prostate cancer (HCC)   Bacteremia due to methicillin resistant Staphylococcus epidermidis   Acute pyelonephritis   Left ureteral calculus   Anemia of chronic disease   Well controlled type 2 diabetes mellitus (HCC)   Bilateral lower extremity edema   Severe sepsis [facility])          Assessment: 78 year old male with history of prostate cancer, hypertension hyperlipidemia, ureteral stones, being treated for UTI with Macrobid  at home presenting with nausea vomiting found to have #MRSE bacteremia in the setting of stones, unclear source possible IE #UTI in the setting of ureteral stone status post stent placement - Patient afebrile on admission, WBC 25K.  Started on ceftriaxone .  CT abdomen pelvis showed left ureteral stone that was obstructive as well as urinary bladder thickening concerning for cystitis.  Urology engaged patient underwent cystoscopy with left ureteral stent placement.  Urine sent for cultures from the OR multiple species growing.  ID engaged given positive blood cultures -TTE no vegetation, patient declining TEE.  Will treat empirically for endocarditis.  Recommendations:  - Continue daptomycin  plan on 6 weeks from negative blood cultures on 11/18 EOT 12/30 - Reviewed sensitivities for MRSE, they match.  I recommended TEE to look for vegetation.  Patient declined states that he is 78 years old and does not want to do a TEE.  He is agreeable to empiric biotics for endocarditis course.  He understands that we might miss a larger vegetation. - Place PICC line  repeat blood culture on 11/18 no growth x 3 days. - Relayed to primary - Standard precaution -ID will sign off OPAT ORDERS:  Diagnosis: MRSE bacteremia   Allergies  Allergen Reactions   Dilaudid [Hydromorphone Hcl] Anaphylaxis and Other (See Comments)    Irregular heart rate     Discharge antibiotics to be given via PICC line:  Per pharmacy protocol Daptomycin  700 mg IV every 24 hours    Duration: 6 weeks End Date: 12/30  [facility] Care Per Protocol with [employer] Use: Home health RN for IV administration and teaching, line care and labs.    Labs weekly while on IV antibiotics: x__ CBC with differential __ BMP **TWICE WEEKLY ON VANCOMYCIN   x__ CMP _x_ CRP _x_ ESR __ Vancomycin  trough TWICE WEEKLY _x_ CK  __ Please pull PIC at completion of IV antibiotics __ Please leave PIC in place until doctor has seen patient or been notified  Fax weekly labs to [phone]  Clinic Follow Up Appt: 12/30  @ [facility] with Dennise   Microbiology:   Antibiotics: Ceftriaxone  9/16-18 Linezolid  11/17-18 Vancomycin  11/8-present Cultures: Blood 11/16 2/2 sets stpah epi 11/18 p Urine 11/16 multiple species SUBJECTIVE: Sitting in chair.  No new complaints. Interval:  Afebrile overnight.  WBC 16K Review of Systems: Review of Systems  All other systems reviewed and are negative.    Scheduled Meds:  Chlorhexidine  Gluconate Cloth  6 each Topical Q0600   cyanocobalamin   1,000 mcg Oral Daily   heparin   5,000 Units Subcutaneous Q8H   insulin  aspart  0-5 Units Subcutaneous QHS   insulin  aspart  0-6 Units Subcutaneous TID WC   metoprolol  succinate  25 mg Oral Daily   [START ON 02/11/2024] rosuvastatin   5 mg  Oral Q Sat   senna-docusate  1 tablet Oral BID   sodium bicarbonate   650 mg Oral BID   sodium chloride  flush  3 mL Intravenous Q12H   tamsulosin   0.4 mg Oral Daily   triamcinolone  cream  1 Application Topical BID   Continuous Infusions:  DAPTOmycin  700 mg (02/09/24 1536)   PRN Meds:.acetaminophen  **OR** acetaminophen , bisacodyl , meclizine , oxyCODONE , polyethylene glycol Allergies  Allergen Reactions   Dilaudid [Hydromorphone  Hcl] Anaphylaxis and Other (See Comments)    Irregular heart rate    OBJECTIVE: Vitals:   02/09/24 1358 02/09/24 2203 02/10/24 0500 02/10/24 0537  BP: (!) 141/63 (!) 181/68  (!) 156/79  Pulse: 75 85  92  Resp:  18  18  Temp: 97.8 F (36.6 C) 98.5 F (36.9 C)  99.3 F (37.4 C)  TempSrc: Oral Oral  Oral  SpO2: 97% 95%  97%  Weight:   110.4 kg   Height:       Body mass index is 33.94 kg/m.  Physical Exam Constitutional:      General: He is not in acute distress.    Appearance: He is normal weight. He is not toxic-appearing.  HENT:     Head: Normocephalic and atraumatic.     Right Ear: External ear normal.     Left Ear: External ear normal.     Nose: No congestion or rhinorrhea.     Mouth/Throat:     Mouth: Mucous membranes are moist.     Pharynx: Oropharynx is clear.  Eyes:     Extraocular Movements: Extraocular movements intact.     Conjunctiva/sclera: Conjunctivae normal.     Pupils: Pupils are equal, round, and reactive to light.  Cardiovascular:     Rate and Rhythm: Normal rate and regular rhythm.     Heart sounds: No murmur heard.    No friction rub. No gallop.  Pulmonary:     Effort: Pulmonary effort is normal.     Breath sounds: Normal breath sounds.  Abdominal:     General: Abdomen is flat. Bowel sounds are normal.     Palpations: Abdomen is soft.  Musculoskeletal:        General: No swelling. Normal range of motion.     Cervical back: Normal range of motion and neck supple.  Skin:    General: Skin is warm and dry.  Neurological:     General: No focal deficit present.     Mental Status: He is oriented to person, place, and time.  Psychiatric:        Mood and Affect: Mood normal.       Lab Results Lab Results  Component Value Date   WBC 16.8 (H) 02/10/2024   HGB 11.7 (L) 02/10/2024   HCT 36.2 (L) 02/10/2024   MCV 93.5 02/10/2024   PLT 377 02/10/2024    Lab Results  Component Value Date   CREATININE 1.72 (H) 02/10/2024   BUN 28 (H)  02/10/2024   NA 136 02/10/2024   K 4.2 02/10/2024   CL 100 02/10/2024   CO2 21 (L) 02/10/2024    Lab Results  Component Value Date   ALT 12 02/05/2024   AST 19 02/05/2024   ALKPHOS 92 02/05/2024   BILITOT 1.1 02/05/2024        Loney Stank, MD [facility] 02/10/2024, 11:20 AM Evaluation of this patient requires complex antimicrobial therapy evaluation and counseling + isolation needs for disease transmission risk assessment and mitigation

## 2024-02-11 DIAGNOSIS — R7881 Bacteremia: Secondary | ICD-10-CM | POA: Diagnosis not present

## 2024-02-11 DIAGNOSIS — N178 Other acute kidney failure: Secondary | ICD-10-CM | POA: Diagnosis not present

## 2024-02-11 DIAGNOSIS — M109 Gout, unspecified: Secondary | ICD-10-CM | POA: Diagnosis present

## 2024-02-11 DIAGNOSIS — C61 Malignant neoplasm of prostate: Secondary | ICD-10-CM | POA: Diagnosis not present

## 2024-02-11 DIAGNOSIS — I10 Essential (primary) hypertension: Secondary | ICD-10-CM | POA: Diagnosis not present

## 2024-02-11 LAB — CBC WITH DIFFERENTIAL/PLATELET
Abs Immature Granulocytes: 0.19 K/uL — ABNORMAL HIGH (ref 0.00–0.07)
Basophils Absolute: 0.1 K/uL (ref 0.0–0.1)
Basophils Relative: 0 %
Eosinophils Absolute: 0.3 K/uL (ref 0.0–0.5)
Eosinophils Relative: 2 %
HCT: 32.6 % — ABNORMAL LOW (ref 39.0–52.0)
Hemoglobin: 10.6 g/dL — ABNORMAL LOW (ref 13.0–17.0)
Immature Granulocytes: 1 %
Lymphocytes Relative: 15 %
Lymphs Abs: 2.5 K/uL (ref 0.7–4.0)
MCH: 30.1 pg (ref 26.0–34.0)
MCHC: 32.5 g/dL (ref 30.0–36.0)
MCV: 92.6 fL (ref 80.0–100.0)
Monocytes Absolute: 2 K/uL — ABNORMAL HIGH (ref 0.1–1.0)
Monocytes Relative: 12 %
Neutro Abs: 12 K/uL — ABNORMAL HIGH (ref 1.7–7.7)
Neutrophils Relative %: 70 %
Platelets: 346 K/uL (ref 150–400)
RBC: 3.52 MIL/uL — ABNORMAL LOW (ref 4.22–5.81)
RDW: 13.7 % (ref 11.5–15.5)
WBC: 17 K/uL — ABNORMAL HIGH (ref 4.0–10.5)
nRBC: 0 % (ref 0.0–0.2)

## 2024-02-11 LAB — RENAL FUNCTION PANEL
Albumin: 3.5 g/dL (ref 3.5–5.0)
Anion gap: 12 (ref 5–15)
BUN: 23 mg/dL (ref 8–23)
CO2: 23 mmol/L (ref 22–32)
Calcium: 9.2 mg/dL (ref 8.9–10.3)
Chloride: 101 mmol/L (ref 98–111)
Creatinine, Ser: 1.68 mg/dL — ABNORMAL HIGH (ref 0.61–1.24)
GFR, Estimated: 41 mL/min — ABNORMAL LOW (ref >60)
Glucose, Bld: 145 mg/dL — ABNORMAL HIGH (ref 70–99)
Phosphorus: 3.4 mg/dL (ref 2.5–4.6)
Potassium: 4.3 mmol/L (ref 3.5–5.1)
Sodium: 136 mmol/L (ref 135–145)

## 2024-02-11 LAB — GLUCOSE, CAPILLARY
Glucose-Capillary: 130 mg/dL — ABNORMAL HIGH (ref 70–99)
Glucose-Capillary: 126 mg/dL — ABNORMAL HIGH (ref 70–99)
Glucose-Capillary: 137 mg/dL — ABNORMAL HIGH (ref 70–99)
Glucose-Capillary: 173 mg/dL — ABNORMAL HIGH (ref 70–99)

## 2024-02-11 MED ORDER — COLCHICINE 0.6 MG PO TABS
1.2000 mg | ORAL_TABLET | Freq: Once | ORAL | Status: AC
  Administered 2024-02-11: 1.2 mg via ORAL
  Filled 2024-02-11: qty 2

## 2024-02-11 MED ORDER — COLCHICINE 0.6 MG PO TABS: 0.6000 mg | ORAL_TABLET | Freq: Two times a day (BID) | ORAL | Status: DC

## 2024-02-11 MED ORDER — COLCHICINE 0.6 MG PO TABS
0.6000 mg | ORAL_TABLET | Freq: Two times a day (BID) | ORAL | Status: DC
  Administered 2024-02-11 – 2024-02-13 (×4): 0.6 mg via ORAL
  Filled 2024-02-11 (×4): qty 1

## 2024-02-11 NOTE — Progress Notes (Signed)
 PROGRESS NOTE    Marcus Simpson  FMW:[id number] DOB: 1945-07-26 DOA: 02/05/2024 PCP: [name], MD    Chief Complaint  Patient presents with   Fever    Brief Narrative:  78 year old male with history of hypertension, hyperlipidemia, diabetes mellitus type 2, prostate cancer, ureteral stone presented with nausea and vomiting after being started on treatment for UTI.  On presentation, WBC was 25.2, creatinine of 3.61, lactic acid of 3 which improved to 2.7.  UA suggestive of UTI.  CT showed findings suggestive of cystitis and 6 mm obstructing left ureteral stone stone.  Patient underwent cystoscopy and stent placement by urology.     Assessment & Plan:   Principal Problem:   Acute kidney injury due to urinary tract obstruction Active Problems:   Hyperlipidemia   Essential hypertension   Prostate cancer (HCC)   Bacteremia due to methicillin resistant Staphylococcus epidermidis   Acute pyelonephritis   Left ureteral calculus   Anemia of chronic disease   Well controlled type 2 diabetes mellitus (HCC)   Bilateral lower extremity edema   Severe sepsis (HCC)   Effusion of right knee joint   Gout attack  #1 severe sepsis secondary to UTI/acute pyelonephritis, MRSE bacteremia/obstructing left ureteral stone - Patient on admission met criteria for sepsis with tachycardia, tachypnea, significant leukocytosis, lactic acidosis, UTI with obstructing left ureteral stone. - Patient pancultured with blood cultures positive for MRSE. - Patient seen in consultation by urology underwent cystoscopy and left ureteral stent placement on 02/05/2024. - Urology recommended continuation of Foley catheter for 3 to 5 days with outpatient follow-up with urology. - Patient noted to be on IV Rocephin , patient noted to have been started on Zyvox  on 02/06/2024. -Patient started on IV Vancomycin .  -Zyvox  and Rocephin  discontinued. -IV Vanco discontinued and patient placed on IV daptomycin  per ID  recommendations. -Urology following. - ID following. - See problem #2.  #2 MRSE bacteremia -Blood cultures noted 4/4 positive for MRSE. -2D echo done negative for vegetations. - Patient seen in consultation by ID who reviewed blood cultures, sensitivities and recommended TEE to look for vegetation. - Per ID patient declined stating that he was 78 years old and did not want to undergo a TEE. - Per ID patient agreeable to empiric antibiotics for a course for endocarditis. - Repeat blood cultures obtained 02/07/2024 with no growth to date - Per ID, PICC line may be placed if repeat blood cultures from 02/07/2024 with no growth x 3 days. -PICC line placed, 02/10/2024. -IV vancomycin  has been changed to IV daptomycin  per ID who are recommending 6 weeks of IV antibiotics from negative blood cultures on 02/07/2024 with EOT 12/30/ 2025. - IV Rocephin  discontinued. - ID following and appreciate the input and recommendations.  3.  AKI/acute metabolic acidosis -Patient on presentation noted to have a creatinine of 3.61. - AKI likely postrenal azotemia secondary to obstructing left ureteral stone in the setting of Indocin. - Patient seen by urology underwent cystoscopy and left ureteral stent placement 02/05/2024. - Patient with a urine output of 1.8 L over the past 24 hours - Renal function slowly trending down creatinine down to 1.68 from 1.72 from 1.88 from 2.11 from as high as 3.61 on admission. - Last creatinine noted on 12/23/2023 at 1.46. - Patient noted with some lower extremity edema. -Patient seems to be auto diuresing. -Will hold off on Lasix . - Saline lock IV fluids. - Acidosis improving. - Continue bicarb tablets. - Avoid nephrotoxic agents.  4.  Large  right suprapatellar knee joint effusion/probable acute gout flare. - Patient noted with complaints of right knee pain today per RN affecting his ability to ambulate. - Plain films of the right knee were obtained which did show a  large right suprapatellar knee joint effusion. - Patient with MRSE bacteremia and on IV daptomycin . -Leukocytosis trending up. - Patient seen in consultation by orthopedics, patient underwent aspiration of the right knee per orthopedics on 02/10/2024 with no organisms seen with preliminary findings.  - Aspirate straw-colored, hazy, WBC of 1470 with 93% neutrophils noted, 6% monocytes, intracellular monosodium urate crystals noted.  - Patient being followed by orthopedics who feel knee aspirate is not consistent with septic arthritis with no plans for surgery from a orthopedic standpoint.  Cultures pending.  - Colchicine  1.2 mg x 1 then colchicine  0.6 mg twice daily.  5. lactic acidosis -Resolved.  6.  Hypertension - Continue home regimen Toprol -XL.  7.  Hyperlipidemia - Statin.   8.  Prostate cancer -Continue tamsulosin . - Outpatient follow-up with oncology and radiation oncology.  9.  Anemia of chronic disease -Hemoglobin stable at 11.7.  10.  Well-controlled diabetes mellitus type 2 -Hemoglobin A1c 6.4 (12/23/2023). -CBG 130 this morning. - Patient noted to be on glipizide prior to admission which is currently on hold. - Continue SSI.   11.  Lower extremity edema -Patient with noted lower extremity edema. - Patient noted to be on IV fluids. -IV fluids discontinued. -Patient seems to be auto diuresing with urine output of 1.8 L over the past 24 hours. - Continue to hold off on diuretics.  -Follow urine output.  12.  Obesity class I -BMI 34.71 kg/m. - Lifestyle modification. - Outpatient follow-up with PCP.   DVT prophylaxis: Heparin  Code Status: Full Family Communication: Updated patient.  Updated wife at bedside.   Disposition: Home when clinically improved, PICC line placed, cleared by ID and urology.  Likely home on Saturday, 02/11/2024  Status is: Inpatient Remains inpatient appropriate because: Severity of illness   Consultants:  Urology: Dr. Shane  02/05/2024 ID: Dr. Dennise 02/07/2024 Orthopedics: Jessica Jordan, PA 02/10/2024  Procedures:  CT abdomen and pelvis 02/05/2024 Chest x-ray 02/05/2024 2D echo 02/07/2024 Cystoscopy, left retrograde pyelogram with interpretation, left ureteral stent placement by urology: Dr. Shane 02/05/2024 Plain films of the right knee 02/10/2024 PICC line 02/10/2024 Right knee aspiration per orthopedics: Jesse Jordan, PA 02/10/2024  Antimicrobials:  Anti-infectives (From admission, onward)    Start     Dose/Rate Route Frequency Ordered Stop   02/10/24 0000  daptomycin  (CUBICIN ) IVPB  Status:  Discontinued        700 mg Intravenous Every 24 hours 02/10/24 0915 02/10/24    02/10/24 0000  daptomycin  (CUBICIN ) IVPB  Status:  Discontinued        700 mg Intravenous Every 24 hours 02/10/24 0920 02/10/24    02/10/24 0000  daptomycin  (CUBICIN ) IVPB  Status:  Discontinued        700 mg Intravenous Every 24 hours 02/10/24 1110 02/10/24    02/10/24 0000  daptomycin  (CUBICIN ) IVPB        700 mg Intravenous Every 24 hours 02/10/24 1224 03/20/24 2359   02/09/24 1530  DAPTOmycin  (CUBICIN ) IVPB 700 mg/123mL premix        8 mg/kg  89.3 kg (Adjusted) 200 mL/hr over 30 Minutes Intravenous Daily 02/09/24 1442     02/08/24 0930  vancomycin  (VANCOREADY) IVPB 750 mg/150 mL  Status:  Discontinued        750 mg 150  mL/hr over 60 Minutes Intravenous Every 12 hours 02/08/24 0843 02/09/24 1442   02/07/24 1545  vancomycin  (VANCOREADY) IVPB 1500 mg/300 mL        1,500 mg 150 mL/hr over 120 Minutes Intravenous  Once 02/07/24 1452 02/07/24 1833   02/07/24 1502  vancomycin  variable dose per unstable renal function (pharmacist dosing)  Status:  Discontinued         Does not apply See admin instructions 02/07/24 1502 02/08/24 0844   02/07/24 0330  linezolid  (ZYVOX ) IVPB 600 mg  Status:  Discontinued        600 mg 300 mL/hr over 60 Minutes Intravenous Every 12 hours 02/06/24 1432 02/07/24 1452   02/06/24 1530  linezolid   (ZYVOX ) IVPB 600 mg        600 mg 300 mL/hr over 60 Minutes Intravenous STAT 02/06/24 1432 02/06/24 1852   02/06/24 1500  cefTRIAXone  (ROCEPHIN ) 2 g in sodium chloride  0.9 % 100 mL IVPB  Status:  Discontinued        2 g 200 mL/hr over 30 Minutes Intravenous Every 24 hours 02/05/24 2203 02/08/24 1049   02/05/24 1430  cefTRIAXone  (ROCEPHIN ) 2 g in sodium chloride  0.9 % 100 mL IVPB        2 g 200 mL/hr over 30 Minutes Intravenous Once 02/05/24 1426 02/05/24 1542         Subjective: Patient sitting up in recliner.  Still with complaints of right knee pain however improved after bedside aspirate per orthopedics.  Patient currently n.p.o. awaiting per orthopedics to determine whether patient needs washout.  Patient denies any chest pain or shortness of breath.  Denies any abdominal pain.  Feels lower extremity swelling has improved.     Objective: Vitals:   02/10/24 2019 02/11/24 0403 02/11/24 0500 02/11/24 1359  BP: (!) 160/76 (!) 169/73  (!) 153/61  Pulse: 96 94  89  Resp: 19 20    Temp: 100.1 F (37.8 C) (!) 100.6 F (38.1 C)  98.6 F (37 C)  TempSrc: Oral Oral  Oral  SpO2: 95% 97%  97%  Weight:   110 kg   Height:        Intake/Output Summary (Last 24 hours) at 02/11/2024 1711 Last data filed at 02/11/2024 1636 Gross per 24 hour  Intake 240 ml  Output 1750 ml  Net -1510 ml   Filed Weights   02/09/24 0500 02/10/24 0500 02/11/24 0500  Weight: 110.4 kg 110.4 kg 110 kg    Examination:  General exam: NAD. Respiratory system: CTAB.  No wheezes, no crackles, no rhonchi.  Fair air movement.  Speaking in full sentences.  Cardiovascular system: Regular rate rhythm no murmurs rubs or gallops.  No JVD.  1-2+ right lower extremity edema.  Trace to 1+ left lower extremity edema.  Gastrointestinal system: Abdomen is soft, nontender, nondistended, positive bowel sounds.  No rebound.  No guarding.  Central nervous system: Alert and oriented. No focal neurological  deficits. Extremities: 1-2+ right lower extremity edema.  Right knee with decreased swelling, decreased tenderness to palpation, no erythema, some warmth.  Skin: No rashes, lesions or ulcers Psychiatry: Judgement and insight appear normal. Mood & affect appropriate.     Data Reviewed: I have personally reviewed following labs and imaging studies  CBC: Recent Labs  Lab 02/05/24 1443 02/06/24 0045 02/07/24 0545 02/08/24 0537 02/09/24 0558 02/10/24 0524 02/11/24 0542  WBC 25.2*   < > 17.7* 13.0* 12.9* 16.8* 17.0*  NEUTROABS 22.9*  --  14.7* 9.3* 8.4*  --  12.0*  HGB 11.8*   < > 10.6* 11.0* 11.0* 11.7* 10.6*  HCT 34.7*   < > 31.7* 33.5* 34.1* 36.2* 32.6*  MCV 91.6   < > 91.1 93.1 93.4 93.5 92.6  PLT 257   < > 294 317 314 377 346   < > = values in this interval not displayed.    Basic Metabolic Panel: Recent Labs  Lab 02/05/24 2233 02/06/24 0045 02/07/24 0545 02/08/24 0537 02/09/24 0558 02/10/24 0524 02/11/24 0542  NA  --    < > 133* 135 138 136 136  K  --    < > 4.4 4.0 4.2 4.2 4.3  CL  --    < > 97* 101 104 100 101  CO2  --    < > 22 19* 19* 21* 23  GLUCOSE  --    < > 136* 139* 158* 156* 145*  BUN  --    < > 48* 46* 39* 28* 23  CREATININE  --    < > 2.50* 2.11* 1.88* 1.72* 1.68*  CALCIUM   --    < > 9.0 8.8* 9.3 9.6 9.2  MG 1.8  --  2.1 2.2  --   --   --   PHOS  --   --   --   --   --   --  3.4   < > = values in this interval not displayed.    GFR: Estimated Creatinine Clearance: 44.2 mL/min (A) (by C-G formula based on SCr of 1.68 mg/dL (H)).  Liver Function Tests: Recent Labs  Lab 02/05/24 1443 02/11/24 0542  AST 19  --   ALT 12  --   ALKPHOS 92  --   BILITOT 1.1  --   PROT 7.6  --   ALBUMIN 3.9 3.5    CBG: Recent Labs  Lab 02/10/24 1524 02/10/24 2048 02/11/24 0727 02/11/24 1147 02/11/24 1634  GLUCAP 163* 143* 130* 126* 137*     Recent Results (from the past 240 hours)  MIC (1 Drug)-Blood culture; 02/05/2024; BLOOD RIGHT FOREARM; MRSE;  Daptomycin      Status: Abnormal   Collection Time: 02/05/24 10:31 AM   Specimen: BLOOD RIGHT FOREARM  Result Value Ref Range Status   Min Inhibitory Conc (1 Drug) Preliminary report (A)  Final    Comment: (NOTE) Performed At: [facility] [employer] [address] [place], [place] [id number] Jennette Shorter MD Ey:[phone]    Source BLOOD  Final    Comment: Performed at [facility], [address] [place], [place] [address]  MIC Result     Status: Abnormal   Collection Time: 02/05/24 10:31 AM  Result Value Ref Range Status   Result 1 (MIC) Comment (A)  Final    Comment: (NOTE) Methicillin - resistant Staphylococcus aureus Identification performed by account, not confirmed by this laboratory. Daptomycin  Performed At: [facility] [address] [place], [place] [id number] Jennette Shorter MD Ey:[phone]   Resp panel by RT-PCR (RSV, Flu A&B, Covid) Anterior Nasal Swab     Status: None   Collection Time: 02/05/24  1:58 PM   Specimen: Anterior Nasal Swab  Result Value Ref Range Status   SARS Coronavirus 2 by RT PCR NEGATIVE NEGATIVE Final    Comment: (NOTE) SARS-CoV-2 target nucleic acids are NOT DETECTED.  The SARS-CoV-2 RNA is generally detectable in upper respiratory specimens during the acute phase of infection. The lowest concentration of SARS-CoV-2 viral copies this assay can detect is 138 copies/mL. A  negative result does not preclude SARS-Cov-2 infection and should not be used as the sole basis for treatment or other patient management decisions. A negative result may occur with  improper specimen collection/handling, submission of specimen other than nasopharyngeal swab, presence of viral mutation(s) within the areas targeted by this assay, and inadequate number of viral copies(<138 copies/mL). A negative result must be combined with clinical observations, patient history, and epidemiological information. The expected result is Negative.  Fact Sheet for  Patients:  bloggercourse.com  Fact Sheet for Healthcare Providers:  seriousbroker.it  This test is no t yet approved or cleared by the [place]  FDA and  has been authorized for detection and/or diagnosis of SARS-CoV-2 by FDA under an Emergency Use Authorization (EUA). This EUA will remain  in effect (meaning this test can be used) for the duration of the COVID-19 declaration under Section 564(b)(1) of the Act, 21 U.S.C.section 360bbb-3(b)(1), unless the authorization is terminated  or revoked sooner.       Influenza A by PCR NEGATIVE NEGATIVE Final   Influenza B by PCR NEGATIVE NEGATIVE Final    Comment: (NOTE) The Xpert Xpress SARS-CoV-2/FLU/RSV plus assay is intended as an aid in the diagnosis of influenza from Nasopharyngeal swab specimens and should not be used as a sole basis for treatment. Nasal washings and aspirates are unacceptable for Xpert Xpress SARS-CoV-2/FLU/RSV testing.  Fact Sheet for Patients: bloggercourse.com  Fact Sheet for Healthcare Providers: seriousbroker.it  This test is not yet approved or cleared by the [place]  FDA and has been authorized for detection and/or diagnosis of SARS-CoV-2 by FDA under an Emergency Use Authorization (EUA). This EUA will remain in effect (meaning this test can be used) for the duration of the COVID-19 declaration under Section 564(b)(1) of the Act, 21 U.S.C. section 360bbb-3(b)(1), unless the authorization is terminated or revoked.     Resp Syncytial Virus by PCR NEGATIVE NEGATIVE Final    Comment: (NOTE) Fact Sheet for Patients: bloggercourse.com  Fact Sheet for Healthcare Providers: seriousbroker.it  This test is not yet approved or cleared by the [place]  FDA and has been authorized for detection and/or diagnosis of SARS-CoV-2 by FDA under an Emergency  Use Authorization (EUA). This EUA will remain in effect (meaning this test can be used) for the duration of the COVID-19 declaration under Section 564(b)(1) of the Act, 21 U.S.C. section 360bbb-3(b)(1), unless the authorization is terminated or revoked.  Performed at [facility], [address], [place], [place] [address]   Urine Culture     Status: Abnormal   Collection Time: 02/05/24  2:05 PM   Specimen: Urine, Random  Result Value Ref Range Status   Specimen Description   Final    URINE, RANDOM Performed at [facility], [address], [place], [place] [address]    Special Requests   Final    NONE Reflexed from [phone] Performed at [facility], [address], [place], [place] [address]    Culture MULTIPLE SPECIES PRESENT, SUGGEST RECOLLECTION (A)  Final   Report Status 02/07/2024 FINAL  Final  Blood Culture (routine x 2)     Status: Abnormal (Preliminary result)   Collection Time: 02/05/24  2:09 PM   Specimen: BLOOD  Result Value Ref Range Status   Specimen Description   Final    BLOOD BLOOD RIGHT FOREARM Performed at [facility], [address] [place], [place] [address]    Special Requests   Final    BOTTLES DRAWN AEROBIC AND ANAEROBIC Blood Culture adequate volume  Performed at [facility], [address] [place], [place] [address]    Culture  Setup Time   Final    GRAM POSITIVE COCCI IN BOTH AEROBIC AND ANAEROBIC BOTTLES Gram Stain Report Called to,Read Back By and Verified With: L SAUNDERS AT [facility] AT 0812 ON [id number] BY S DALTON    Culture (A)  Final    STAPHYLOCOCCUS EPIDERMIDIS Sent to [facility] for further susceptibility testing. Performed at [facility], [address], [place], [place] [address]    Report Status PENDING  Incomplete   Organism ID, Bacteria STAPHYLOCOCCUS EPIDERMIDIS  Final      Susceptibility   Staphylococcus epidermidis - MIC*    CIPROFLOXACIN  >=8 RESISTANT Resistant     ERYTHROMYCIN >=8 RESISTANT Resistant     GENTAMICIN  <=0.5  SENSITIVE Sensitive     OXACILLIN >=4 RESISTANT Resistant     TETRACYCLINE 2 SENSITIVE Sensitive     VANCOMYCIN  1 SENSITIVE Sensitive     TRIMETH/SULFA 80 RESISTANT Resistant     CLINDAMYCIN >=8 RESISTANT Resistant     RIFAMPIN <=0.5 SENSITIVE Sensitive     Inducible Clindamycin NEGATIVE Sensitive     * STAPHYLOCOCCUS EPIDERMIDIS  Blood Culture (routine x 2)     Status: Abnormal   Collection Time: 02/05/24  2:43 PM   Specimen: BLOOD  Result Value Ref Range Status   Specimen Description   Final    BLOOD BLOOD LEFT ARM Performed at [facility], [address] [place], [place] [address]    Special Requests   Final    BOTTLES DRAWN AEROBIC AND ANAEROBIC Blood Culture adequate volume Performed at [facility], [address] [place], [place] [address]    Culture  Setup Time   Final    GRAM POSITIVE COCCI IN BOTH AEROBIC AND ANAEROBIC BOTTLES Gram Stain Report Called to,Read Back By and Verified With: L SAUNDERS AT [facility] AT 0812 ON [id number] BY S DALTON CRITICAL RESULT CALLED TO, READ BACK BY AND VERIFIED WITH: PHARMD DREW WOFFORD ON 02/06/24 @ 1517 BY DRT Performed at [facility], [address], [place], [place] [address]    Culture STAPHYLOCOCCUS EPIDERMIDIS (A)  Final   Report Status 02/08/2024 FINAL  Final   Organism ID, Bacteria STAPHYLOCOCCUS EPIDERMIDIS  Final      Susceptibility   Staphylococcus epidermidis - MIC*    CIPROFLOXACIN  >=8 RESISTANT Resistant     ERYTHROMYCIN >=8 RESISTANT Resistant     GENTAMICIN  <=0.5 SENSITIVE Sensitive     OXACILLIN >=4 RESISTANT Resistant     TETRACYCLINE 2 SENSITIVE Sensitive     VANCOMYCIN  1 SENSITIVE Sensitive     TRIMETH/SULFA 160 RESISTANT Resistant     CLINDAMYCIN >=8 RESISTANT Resistant     RIFAMPIN <=0.5 SENSITIVE Sensitive     Inducible Clindamycin NEGATIVE Sensitive     * STAPHYLOCOCCUS EPIDERMIDIS  Blood Culture ID Panel (Reflexed)     Status: Abnormal   Collection Time: 02/05/24  2:43 PM  Result Value Ref Range Status    Enterococcus faecalis NOT DETECTED NOT DETECTED Final   Enterococcus Faecium NOT DETECTED NOT DETECTED Final   Listeria monocytogenes NOT DETECTED NOT DETECTED Final   Staphylococcus species DETECTED (A) NOT DETECTED Final    Comment: CRITICAL RESULT CALLED TO, READ BACK BY AND VERIFIED WITH: PHARMD DREW WOFFORD ON 02/06/24 @ 1517 BY DRT    Staphylococcus aureus (BCID) NOT DETECTED NOT DETECTED Final   Staphylococcus epidermidis DETECTED (A) NOT DETECTED Final    Comment: Methicillin (oxacillin) resistant  coagulase negative staphylococcus. Possible blood culture contaminant (unless isolated from more than one blood culture draw or clinical case suggests pathogenicity). No antibiotic treatment is indicated for blood  culture contaminants. CRITICAL RESULT CALLED TO, READ BACK BY AND VERIFIED WITH: PHARMD DREW WOFFORD ON 02/06/24 @ 1517 BY DRT    Staphylococcus lugdunensis NOT DETECTED NOT DETECTED Final   Streptococcus species NOT DETECTED NOT DETECTED Final   Streptococcus agalactiae NOT DETECTED NOT DETECTED Final   Streptococcus pneumoniae NOT DETECTED NOT DETECTED Final   Streptococcus pyogenes NOT DETECTED NOT DETECTED Final   A.calcoaceticus-baumannii NOT DETECTED NOT DETECTED Final   Bacteroides fragilis NOT DETECTED NOT DETECTED Final   Enterobacterales NOT DETECTED NOT DETECTED Final   Enterobacter cloacae complex NOT DETECTED NOT DETECTED Final   Escherichia coli NOT DETECTED NOT DETECTED Final   Klebsiella aerogenes NOT DETECTED NOT DETECTED Final   Klebsiella oxytoca NOT DETECTED NOT DETECTED Final   Klebsiella pneumoniae NOT DETECTED NOT DETECTED Final   Proteus species NOT DETECTED NOT DETECTED Final   Salmonella species NOT DETECTED NOT DETECTED Final   Serratia marcescens NOT DETECTED NOT DETECTED Final   Haemophilus influenzae NOT DETECTED NOT DETECTED Final   Neisseria meningitidis NOT DETECTED NOT DETECTED Final   Pseudomonas aeruginosa NOT DETECTED NOT DETECTED  Final   Stenotrophomonas maltophilia NOT DETECTED NOT DETECTED Final   Candida albicans NOT DETECTED NOT DETECTED Final   Candida auris NOT DETECTED NOT DETECTED Final   Candida glabrata NOT DETECTED NOT DETECTED Final   Candida krusei NOT DETECTED NOT DETECTED Final   Candida parapsilosis NOT DETECTED NOT DETECTED Final   Candida tropicalis NOT DETECTED NOT DETECTED Final   Cryptococcus neoformans/gattii NOT DETECTED NOT DETECTED Final   Methicillin resistance mecA/C DETECTED (A) NOT DETECTED Final    Comment: CRITICAL RESULT CALLED TO, READ BACK BY AND VERIFIED WITH: PHARMD DREW WOFFORD ON 02/06/24 @ 1517 BY DRT Performed at [facility], [address], [place], [place] [address]   Urine Culture     Status: Abnormal   Collection Time: 02/05/24  8:41 PM   Specimen: Urine, Cystoscope  Result Value Ref Range Status   Specimen Description   Final    URINE, RANDOM Performed at [facility], [address], [place], [place] [address]    Special Requests   Final    NONE Performed at [facility], [address], [place], [place] [address]    Culture MULTIPLE SPECIES PRESENT, SUGGEST RECOLLECTION (A)  Final   Report Status 02/07/2024 FINAL  Final  Culture, blood (Routine X 2) w Reflex to ID Panel     Status: None (Preliminary result)   Collection Time: 02/07/24 11:23 AM   Specimen: BLOOD RIGHT HAND  Result Value Ref Range Status   Specimen Description BLOOD RIGHT HAND  Final   Special Requests   Final    BOTTLES DRAWN AEROBIC ONLY Blood Culture results may not be optimal due to an inadequate volume of blood received in culture bottles   Culture   Final    NO GROWTH 4 DAYS Performed at [facility], [address], [place], [place] [address]    Report Status PENDING  Incomplete  Culture, blood (Routine X 2) w Reflex to ID Panel     Status: None (Preliminary result)   Collection Time: 02/07/24 11:27 AM   Specimen: BLOOD RIGHT HAND   Result Value Ref Range Status   Specimen Description BLOOD RIGHT HAND  Final  Special Requests   Final    BOTTLES DRAWN AEROBIC ONLY Blood Culture results may not be optimal due to an inadequate volume of blood received in culture bottles   Culture   Final    NO GROWTH 4 DAYS Performed at [facility], [address], [place], [place] [address]    Report Status PENDING  Incomplete  Body fluid culture w Gram Stain     Status: None (Preliminary result)   Collection Time: 02/10/24  5:11 PM   Specimen: Synovium; Synovial Fluid  Result Value Ref Range Status   Specimen Description   Final    SYNOVIAL Performed at [facility], [address], [place], [place] [address]    Special Requests   Final     R KNEE Performed at [facility], [address] [address], [place], [place] [address]    Gram Stain   Final    ABUNDANT WBC PRESENT, PREDOMINANTLY PMN NO ORGANISMS SEEN    Culture   Final    NO GROWTH < 24 HOURS Performed at [facility], [address], [place], [place] [address]    Report Status PENDING  Incomplete  Anaerobic culture w Gram Stain     Status: None (Preliminary result)   Collection Time: 02/10/24  5:11 PM   Specimen: Synovium; Synovial Fluid  Result Value Ref Range Status   Specimen Description   Final    SYNOVIAL Performed at [facility], [address] [place], [place] [address]    Special Requests   Final    R KNEE Performed at [facility], [address], [place], [place] [address]    Gram Stain   Final    FEW WBC PRESENT, PREDOMINANTLY PMN NO ORGANISMS SEEN Performed at [facility], [address], [place], [place] [address]    Culture PENDING  Incomplete   Report Status PENDING  Incomplete         Radiology Studies: DG Knee Complete 4 Views Right Result Date: 02/10/2024 EXAM: 4 OR MORE VIEW(S) XRAY OF THE KNEE 02/10/2024 10:42:00 AM COMPARISON: None  available. CLINICAL HISTORY: 13689 Swelling [id number] FINDINGS: BONES AND JOINTS: No acute fracture. No focal osseous lesion. No joint dislocation. Mild tri-compartment degenerative changes. Chondrocalcinosis. Large suprapatellar knee joint effusion . SOFT TISSUES: The soft tissues are unremarkable. IMPRESSION: 1. Large suprapatellar knee joint effusion. 2. Tricompartmental osteoarthritis. Electronically signed by: Waddell Calk MD 02/10/2024 02:07 PM EST RP Workstation: [id number]   US  EKG SITE RITE Result Date: 02/10/2024 If Site Rite image not attached, placement could not be confirmed due to current cardiac rhythm.  US  EKG SITE RITE Result Date: 02/10/2024 If Site Rite image not attached, placement could not be confirmed due to current cardiac rhythm.        Scheduled Meds:  Chlorhexidine  Gluconate Cloth  6 each Topical Q0600   colchicine   0.6 mg Oral BID   cyanocobalamin   1,000 mcg Oral Daily   heparin   5,000 Units Subcutaneous Q8H   insulin  aspart  0-5 Units Subcutaneous QHS   insulin  aspart  0-6 Units Subcutaneous TID WC   metoprolol  succinate  25 mg Oral Daily   rosuvastatin   5 mg Oral Q Sat   senna-docusate  1 tablet Oral BID   sodium chloride  flush  3 mL Intravenous Q12H   tamsulosin   0.4 mg Oral Daily   triamcinolone  cream  1 Application Topical BID   Continuous Infusions:  DAPTOmycin  700  mg (02/11/24 1325)     LOS: 6 days    Time spent: 40 minutes    Toribio Hummer, MD [facility]   To contact the attending provider between 7A-7P or the covering provider during after hours 7P-7A, please log into the web site [email or web address] and access using universal [place] password for that web site. If you do not have the password, please call the hospital operator.  02/11/2024, 5:11 PM

## 2024-02-11 NOTE — Progress Notes (Signed)
 ID note ADDENDUM: In the afternoon noted to have  right knee swelling with difficulty ambuating. Declining MRI, Xray pending at that time. C/f possible septic joint in the setting of swollen knee(no edema)

## 2024-02-11 NOTE — Plan of Care (Signed)
   Problem: Fluid Volume: Goal: Hemodynamic stability will improve Outcome: Progressing   Problem: Clinical Measurements: Goal: Diagnostic test results will improve Outcome: Progressing Goal: Signs and symptoms of infection will decrease Outcome: Progressing   Problem: Respiratory: Goal: Ability to maintain adequate ventilation will improve Outcome: Progressing

## 2024-02-11 NOTE — Progress Notes (Signed)
 Knee aspiration results inconsistent with septic arthritis. Marcus Simpson's present. No plans for surgery from an orthopedic standpoint. Will await culture results.Okay for diet. Orthopedics will sign off.

## 2024-02-12 DIAGNOSIS — C61 Malignant neoplasm of prostate: Secondary | ICD-10-CM | POA: Diagnosis not present

## 2024-02-12 DIAGNOSIS — R7881 Bacteremia: Secondary | ICD-10-CM | POA: Diagnosis not present

## 2024-02-12 DIAGNOSIS — N178 Other acute kidney failure: Secondary | ICD-10-CM | POA: Diagnosis not present

## 2024-02-12 DIAGNOSIS — I10 Essential (primary) hypertension: Secondary | ICD-10-CM | POA: Diagnosis not present

## 2024-02-12 LAB — RENAL FUNCTION PANEL
Albumin: 3.5 g/dL (ref 3.5–5.0)
Anion gap: 12 (ref 5–15)
BUN: 24 mg/dL — ABNORMAL HIGH (ref 8–23)
CO2: 23 mmol/L (ref 22–32)
Calcium: 9.4 mg/dL (ref 8.9–10.3)
Chloride: 98 mmol/L (ref 98–111)
Creatinine, Ser: 1.69 mg/dL — ABNORMAL HIGH (ref 0.61–1.24)
GFR, Estimated: 41 mL/min — ABNORMAL LOW (ref >60)
Glucose, Bld: 150 mg/dL — ABNORMAL HIGH (ref 70–99)
Phosphorus: 3.3 mg/dL (ref 2.5–4.6)
Potassium: 4 mmol/L (ref 3.5–5.1)
Sodium: 134 mmol/L — ABNORMAL LOW (ref 135–145)

## 2024-02-12 LAB — GLUCOSE, CAPILLARY
Glucose-Capillary: 145 mg/dL — ABNORMAL HIGH (ref 70–99)
Glucose-Capillary: 158 mg/dL — ABNORMAL HIGH (ref 70–99)
Glucose-Capillary: 225 mg/dL — ABNORMAL HIGH (ref 70–99)
Glucose-Capillary: 210 mg/dL — ABNORMAL HIGH (ref 70–99)

## 2024-02-12 LAB — CBC WITH DIFFERENTIAL/PLATELET
Abs Immature Granulocytes: 0.2 K/uL — ABNORMAL HIGH (ref 0.00–0.07)
Basophils Absolute: 0.1 K/uL (ref 0.0–0.1)
Basophils Relative: 0 %
Eosinophils Absolute: 0.4 K/uL (ref 0.0–0.5)
Eosinophils Relative: 2 %
HCT: 33.7 % — ABNORMAL LOW (ref 39.0–52.0)
Hemoglobin: 11 g/dL — ABNORMAL LOW (ref 13.0–17.0)
Immature Granulocytes: 1 %
Lymphocytes Relative: 16 %
Lymphs Abs: 2.7 K/uL (ref 0.7–4.0)
MCH: 30.4 pg (ref 26.0–34.0)
MCHC: 32.6 g/dL (ref 30.0–36.0)
MCV: 93.1 fL (ref 80.0–100.0)
Monocytes Absolute: 1.6 K/uL — ABNORMAL HIGH (ref 0.1–1.0)
Monocytes Relative: 9 %
Neutro Abs: 12.1 K/uL — ABNORMAL HIGH (ref 1.7–7.7)
Neutrophils Relative %: 72 %
Platelets: 369 K/uL (ref 150–400)
RBC: 3.62 MIL/uL — ABNORMAL LOW (ref 4.22–5.81)
RDW: 13.5 % (ref 11.5–15.5)
WBC: 17 K/uL — ABNORMAL HIGH (ref 4.0–10.5)
nRBC: 0 % (ref 0.0–0.2)

## 2024-02-12 LAB — MINIMUM INHIBITORY CONC. (1 DRUG)

## 2024-02-12 LAB — MIC RESULT

## 2024-02-12 MED ORDER — MUSCLE RUB 10-15 % EX CREA
TOPICAL_CREAM | CUTANEOUS | Status: DC | PRN
Start: 2024-02-12 — End: 2024-02-13
  Filled 2024-02-12: qty 85

## 2024-02-12 MED ORDER — FUROSEMIDE 10 MG/ML IJ SOLN
40.0000 mg | Freq: Once | INTRAMUSCULAR | Status: AC
  Administered 2024-02-12: 40 mg via INTRAVENOUS
  Filled 2024-02-12: qty 4

## 2024-02-12 MED ORDER — METHYLPREDNISOLONE SODIUM SUCC 125 MG IJ SOLR
60.0000 mg | Freq: Once | INTRAMUSCULAR | Status: AC
  Administered 2024-02-12: 60 mg via INTRAVENOUS
  Filled 2024-02-12: qty 2

## 2024-02-12 NOTE — Progress Notes (Signed)
 PROGRESS NOTE    Marcus Simpson  FMW:[id number] DOB: 25-May-1945 DOA: 02/05/2024 PCP: [name], MD    Chief Complaint  Patient presents with   Fever    Brief Narrative:  78 year old male with history of hypertension, hyperlipidemia, diabetes mellitus type 2, prostate cancer, ureteral stone presented with nausea and vomiting after being started on treatment for UTI.  On presentation, WBC was 25.2, creatinine of 3.61, lactic acid of 3 which improved to 2.7.  UA suggestive of UTI.  CT showed findings suggestive of cystitis and 6 mm obstructing left ureteral stone stone.  Patient underwent cystoscopy and stent placement by urology.     Assessment & Plan:   Principal Problem:   Acute kidney injury due to urinary tract obstruction Active Problems:   Hyperlipidemia   Essential hypertension   Prostate cancer (HCC)   Bacteremia due to methicillin resistant Staphylococcus epidermidis   Acute pyelonephritis   Left ureteral calculus   Anemia of chronic disease   Well controlled type 2 diabetes mellitus (HCC)   Bilateral lower extremity edema   Severe sepsis (HCC)   Effusion of right knee joint   Gout attack  #1 severe sepsis secondary to UTI/acute pyelonephritis, MRSE bacteremia/obstructing left ureteral stone - Patient on admission met criteria for sepsis with tachycardia, tachypnea, significant leukocytosis, lactic acidosis, UTI with obstructing left ureteral stone. - Patient pancultured with blood cultures positive for MRSE. - Patient seen in consultation by urology underwent cystoscopy and left ureteral stent placement on 02/05/2024. - Urology recommended continuation of Foley catheter for 3 to 5 days with outpatient follow-up with urology. - Patient noted to be on IV Rocephin , patient noted to have been started on Zyvox  on 02/06/2024. -Zyvox  and Rocephin  discontinued. -IV Vanco discontinued and patient placed on IV daptomycin  per ID recommendations. -Urology following. -  ID following. - See problem #2.  #2 MRSE bacteremia -Blood cultures noted 4/4 positive for MRSE. -2D echo done negative for vegetations. - Patient seen in consultation by ID who reviewed blood cultures, sensitivities and recommended TEE to look for vegetation. - Per ID patient declined stating that he was 78 years old and did not want to undergo a TEE. - Per ID patient agreeable to empiric antibiotics for a course for endocarditis. - Repeat blood cultures obtained 02/07/2024 with no growth to date - Per ID, PICC line may be placed if repeat blood cultures from 02/07/2024 with no growth x 4 days. -PICC line placed, 02/10/2024. -IV vancomycin  has been changed to IV daptomycin  per ID who are recommending 6 weeks of IV antibiotics from negative blood cultures on 02/07/2024 with EOT 12/30/ 2025. - IV Rocephin  discontinued. - ID following and appreciate the input and recommendations.  3.  AKI/acute metabolic acidosis -Patient on presentation noted to have a creatinine of 3.61. - AKI likely postrenal azotemia secondary to obstructing left ureteral stone in the setting of Indocin. - Patient seen by urology underwent cystoscopy and left ureteral stent placement 02/05/2024. - Patient with a urine output of 1.4 L over the past 24 hours - Renal function was slowly trending down but seems to be plateauing and currently at 1.69 from 1.68 from 1.72 from 1.88 from 2.11 from as high as 3.61 on admission. - Last creatinine noted on 12/23/2023 at 1.46. - Patient noted with some lower extremity edema. -Patient seems to be auto diuresing. -Lasix  40 mg IV x 1. -Monitor urine output. - Saline lock IV fluids. - Acidosis improving. - Continue bicarb tablets for another  24 hours. - Avoid nephrotoxic agents.  4.  Large right suprapatellar knee joint effusion secondary to acute gout flare. - Patient noted with complaints of right knee pain on 02/10/2024 with difficulty ambulating. -Patient with prior history of  gout with flares usually in his hands per patient. - Plain films of the right knee were obtained which did show a large right suprapatellar knee joint effusion. - Patient with MRSE bacteremia and on IV daptomycin . -Leukocytosis trending up but seems to be plateauing. - Patient seen in consultation by orthopedics, patient underwent aspiration of the right knee per orthopedics on 02/10/2024 with no organisms seen with preliminary results..  - Aspirate straw-colored, hazy, WBC of 1470 with 93% neutrophils noted, 6% monocytes, intracellular monosodium urate crystals noted.  - Patient being followed by orthopedics who feel knee aspirate is not consistent with septic arthritis with no plans for surgery from a orthopedic standpoint.  Cultures pending.  -Patient still with pain in the right knee despite initiation of colchicine . -Status post colchicine  1.2 mg x 1. -Continue colchicine  0.6 mg twice daily. -Solu-Medrol  60 mg IV x 1.  5. lactic acidosis -Resolved.  6.  Hypertension - Continue home regimen Toprol -XL.  7.  Hyperlipidemia - Continue statin.   8.  Prostate cancer -Continue tamsulosin . - Outpatient follow-up with oncology and radiation oncology.  9.  Anemia of chronic disease -Hemoglobin stable at 11.0.  10.  Well-controlled diabetes mellitus type 2 -Hemoglobin A1c 6.4 (12/23/2023). -CBG 145 this morning. - Patient noted to be on glipizide prior to admission which is currently on hold. - Continue SSI.   11.  Lower extremity edema -Patient with noted lower extremity edema. - Patient noted to be on IV fluids early on in the hospitalization. -IV fluids discontinued. -Patient seems to be auto diuresing with urine output of 1.4 L over the past 24 hours. -Lasix  40 mg IV x 1. -Monitor urine output.  12.  Obesity class I -BMI 34.71 kg/m. - Lifestyle modification. - Outpatient follow-up with PCP.   DVT prophylaxis: Heparin  Code Status: Full Family Communication: Updated  patient.  Updated wife at bedside.   Disposition: Home when clinically improved, PICC line placed, cleared by ID and urology.  Likely home in the next 24 to 48 hours.   Status is: Inpatient Remains inpatient appropriate because: Severity of illness   Consultants:  Urology: Dr. Shane 02/05/2024 ID: Dr. Dennise 02/07/2024 Orthopedics: Jessica Jordan, PA 02/10/2024  Procedures:  CT abdomen and pelvis 02/05/2024 Chest x-ray 02/05/2024 2D echo 02/07/2024 Cystoscopy, left retrograde pyelogram with interpretation, left ureteral stent placement by urology: Dr. Shane 02/05/2024 Plain films of the right knee 02/10/2024 PICC line 02/10/2024 Right knee aspiration per orthopedics: Jesse Jordan, PA 02/10/2024  Antimicrobials:  Anti-infectives (From admission, onward)    Start     Dose/Rate Route Frequency Ordered Stop   02/10/24 0000  daptomycin  (CUBICIN ) IVPB  Status:  Discontinued        700 mg Intravenous Every 24 hours 02/10/24 0915 02/10/24    02/10/24 0000  daptomycin  (CUBICIN ) IVPB  Status:  Discontinued        700 mg Intravenous Every 24 hours 02/10/24 0920 02/10/24    02/10/24 0000  daptomycin  (CUBICIN ) IVPB  Status:  Discontinued        700 mg Intravenous Every 24 hours 02/10/24 1110 02/10/24    02/10/24 0000  daptomycin  (CUBICIN ) IVPB        700 mg Intravenous Every 24 hours 02/10/24 1224 03/20/24 2359   02/09/24 1530  DAPTOmycin  (CUBICIN ) IVPB 700 mg/100mL premix        8 mg/kg  89.3 kg (Adjusted) 200 mL/hr over 30 Minutes Intravenous Daily 02/09/24 1442     02/08/24 0930  vancomycin  (VANCOREADY) IVPB 750 mg/150 mL  Status:  Discontinued        750 mg 150 mL/hr over 60 Minutes Intravenous Every 12 hours 02/08/24 0843 02/09/24 1442   02/07/24 1545  vancomycin  (VANCOREADY) IVPB 1500 mg/300 mL        1,500 mg 150 mL/hr over 120 Minutes Intravenous  Once 02/07/24 1452 02/07/24 1833   02/07/24 1502  vancomycin  variable dose per unstable renal function (pharmacist dosing)   Status:  Discontinued         Does not apply See admin instructions 02/07/24 1502 02/08/24 0844   02/07/24 0330  linezolid  (ZYVOX ) IVPB 600 mg  Status:  Discontinued        600 mg 300 mL/hr over 60 Minutes Intravenous Every 12 hours 02/06/24 1432 02/07/24 1452   02/06/24 1530  linezolid  (ZYVOX ) IVPB 600 mg        600 mg 300 mL/hr over 60 Minutes Intravenous STAT 02/06/24 1432 02/06/24 1852   02/06/24 1500  cefTRIAXone  (ROCEPHIN ) 2 g in sodium chloride  0.9 % 100 mL IVPB  Status:  Discontinued        2 g 200 mL/hr over 30 Minutes Intravenous Every 24 hours 02/05/24 2203 02/08/24 1049   02/05/24 1430  cefTRIAXone  (ROCEPHIN ) 2 g in sodium chloride  0.9 % 100 mL IVPB        2 g 200 mL/hr over 30 Minutes Intravenous Once 02/05/24 1426 02/05/24 1542         Subjective: Patient noted ambulating with the aid of a walker with therapy.  Still with right knee pain but slowly improving.  Able to bear weight today on right knee.  Denies any chest pain, no shortness of breath, no abdominal pain.  Son at bedside.    Objective: Vitals:   02/11/24 0500 02/11/24 1359 02/11/24 2104 02/12/24 0449  BP:  (!) 153/61 (!) 132/57 (!) 152/81  Pulse:  89 75 94  Resp:   18 18  Temp:  98.6 F (37 C) 99 F (37.2 C) 98.9 F (37.2 C)  TempSrc:  Oral Oral Oral  SpO2:  97% 96% 96%  Weight: 110 kg     Height:        Intake/Output Summary (Last 24 hours) at 02/12/2024 1258 Last data filed at 02/12/2024 0900 Gross per 24 hour  Intake 220 ml  Output 1400 ml  Net -1180 ml   Filed Weights   02/09/24 0500 02/10/24 0500 02/11/24 0500  Weight: 110.4 kg 110.4 kg 110 kg    Examination:  General exam: NAD. Respiratory system: Lungs clear to auscultation bilaterally.  No wheezes, no crackles, no rhonchi.  Fair air movement.  Speaking in full sentences.  Cardiovascular system: RRR no murmurs rubs or gallops.  No JVD.  2+ right lower extremity edema.  1-2+ left lower extremity edema.   Gastrointestinal system:  Abdomen is soft, nontender, nondistended, positive bowel sounds.  No rebound.  No guarding.  Central nervous system: Alert and oriented. No focal neurological deficits. Extremities: 2+ right lower extremity edema.  1-2+ left lower extremity edema.  Right knee with some decrease swelling, decreased tenderness to palpation, some warmth, no erythema noted.   Skin: No rashes, lesions or ulcers Psychiatry: Judgement and insight appear normal. Mood & affect appropriate.  Data Reviewed: I have personally reviewed following labs and imaging studies  CBC: Recent Labs  Lab 02/07/24 0545 02/08/24 0537 02/09/24 0558 02/10/24 0524 02/11/24 0542 02/12/24 0312  WBC 17.7* 13.0* 12.9* 16.8* 17.0* 17.0*  NEUTROABS 14.7* 9.3* 8.4*  --  12.0* 12.1*  HGB 10.6* 11.0* 11.0* 11.7* 10.6* 11.0*  HCT 31.7* 33.5* 34.1* 36.2* 32.6* 33.7*  MCV 91.1 93.1 93.4 93.5 92.6 93.1  PLT 294 317 314 377 346 369    Basic Metabolic Panel: Recent Labs  Lab 02/05/24 2233 02/06/24 0045 02/07/24 0545 02/08/24 0537 02/09/24 0558 02/10/24 0524 02/11/24 0542 02/12/24 0312  NA  --    < > 133* 135 138 136 136 134*  K  --    < > 4.4 4.0 4.2 4.2 4.3 4.0  CL  --    < > 97* 101 104 100 101 98  CO2  --    < > 22 19* 19* 21* 23 23  GLUCOSE  --    < > 136* 139* 158* 156* 145* 150*  BUN  --    < > 48* 46* 39* 28* 23 24*  CREATININE  --    < > 2.50* 2.11* 1.88* 1.72* 1.68* 1.69*  CALCIUM   --    < > 9.0 8.8* 9.3 9.6 9.2 9.4  MG 1.8  --  2.1 2.2  --   --   --   --   PHOS  --   --   --   --   --   --  3.4 3.3   < > = values in this interval not displayed.    GFR: Estimated Creatinine Clearance: 44 mL/min (A) (by C-G formula based on SCr of 1.69 mg/dL (H)).  Liver Function Tests: Recent Labs  Lab 02/05/24 1443 02/11/24 0542 02/12/24 0312  AST 19  --   --   ALT 12  --   --   ALKPHOS 92  --   --   BILITOT 1.1  --   --   PROT 7.6  --   --   ALBUMIN 3.9 3.5 3.5    CBG: Recent Labs  Lab 02/11/24 1147  02/11/24 1634 02/11/24 2102 02/12/24 0724 02/12/24 1135  GLUCAP 126* 137* 173* 145* 158*     Recent Results (from the past 240 hours)  MIC (1 Drug)-Blood culture; 02/05/2024; BLOOD RIGHT FOREARM; MRSE; Daptomycin      Status: Abnormal   Collection Time: 02/05/24 10:31 AM   Specimen: BLOOD RIGHT FOREARM  Result Value Ref Range Status   Min Inhibitory Conc (1 Drug) Preliminary report (A)  Final    Comment: (NOTE) Performed At: [facility] [employer] [address] [place], [place] [id number] Jennette Shorter MD Ey:[phone]    Source BLOOD  Final    Comment: Performed at [facility], [address] [place], [place] [address]  MIC Result     Status: Abnormal   Collection Time: 02/05/24 10:31 AM  Result Value Ref Range Status   Result 1 (MIC) Comment (A)  Final    Comment: (NOTE) Methicillin - resistant Staphylococcus aureus Identification performed by account, not confirmed by this laboratory. Daptomycin  Performed At: [facility] [address] [place], [place] [id number] Jennette Shorter MD Ey:[phone]   Resp panel by RT-PCR (RSV, Flu A&B, Covid) Anterior Nasal Swab     Status: None   Collection Time: 02/05/24  1:58 PM   Specimen: Anterior Nasal Swab  Result Value Ref Range Status   SARS Coronavirus  2 by RT PCR NEGATIVE NEGATIVE Final    Comment: (NOTE) SARS-CoV-2 target nucleic acids are NOT DETECTED.  The SARS-CoV-2 RNA is generally detectable in upper respiratory specimens during the acute phase of infection. The lowest concentration of SARS-CoV-2 viral copies this assay can detect is 138 copies/mL. A negative result does not preclude SARS-Cov-2 infection and should not be used as the sole basis for treatment or other patient management decisions. A negative result may occur with  improper specimen collection/handling, submission of specimen other than nasopharyngeal swab, presence of viral mutation(s) within the areas targeted by this assay, and  inadequate number of viral copies(<138 copies/mL). A negative result must be combined with clinical observations, patient history, and epidemiological information. The expected result is Negative.  Fact Sheet for Patients:  bloggercourse.com  Fact Sheet for Healthcare Providers:  seriousbroker.it  This test is no t yet approved or cleared by the [place]  FDA and  has been authorized for detection and/or diagnosis of SARS-CoV-2 by FDA under an Emergency Use Authorization (EUA). This EUA will remain  in effect (meaning this test can be used) for the duration of the COVID-19 declaration under Section 564(b)(1) of the Act, 21 U.S.C.section 360bbb-3(b)(1), unless the authorization is terminated  or revoked sooner.       Influenza A by PCR NEGATIVE NEGATIVE Final   Influenza B by PCR NEGATIVE NEGATIVE Final    Comment: (NOTE) The Xpert Xpress SARS-CoV-2/FLU/RSV plus assay is intended as an aid in the diagnosis of influenza from Nasopharyngeal swab specimens and should not be used as a sole basis for treatment. Nasal washings and aspirates are unacceptable for Xpert Xpress SARS-CoV-2/FLU/RSV testing.  Fact Sheet for Patients: bloggercourse.com  Fact Sheet for Healthcare Providers: seriousbroker.it  This test is not yet approved or cleared by the [place]  FDA and has been authorized for detection and/or diagnosis of SARS-CoV-2 by FDA under an Emergency Use Authorization (EUA). This EUA will remain in effect (meaning this test can be used) for the duration of the COVID-19 declaration under Section 564(b)(1) of the Act, 21 U.S.C. section 360bbb-3(b)(1), unless the authorization is terminated or revoked.     Resp Syncytial Virus by PCR NEGATIVE NEGATIVE Final    Comment: (NOTE) Fact Sheet for Patients: bloggercourse.com  Fact Sheet for Healthcare  Providers: seriousbroker.it  This test is not yet approved or cleared by the [place]  FDA and has been authorized for detection and/or diagnosis of SARS-CoV-2 by FDA under an Emergency Use Authorization (EUA). This EUA will remain in effect (meaning this test can be used) for the duration of the COVID-19 declaration under Section 564(b)(1) of the Act, 21 U.S.C. section 360bbb-3(b)(1), unless the authorization is terminated or revoked.  Performed at [facility], [address] [place], [place] [address]   Urine Culture     Status: Abnormal   Collection Time: 02/05/24  2:05 PM   Specimen: Urine, Random  Result Value Ref Range Status   Specimen Description   Final    URINE, RANDOM Performed at [facility], [address], [place], [place] [address]    Special Requests   Final    NONE Reflexed from [id number] Performed at [facility], [address] [place], [place] [address]    Culture MULTIPLE SPECIES PRESENT, SUGGEST RECOLLECTION (A)  Final   Report Status 02/07/2024 FINAL  Final  Blood Culture (routine x 2)     Status: Abnormal (Preliminary result)   Collection Time: 02/05/24  2:09 PM   Specimen: BLOOD  Result Value Ref Range Status   Specimen Description   Final    BLOOD BLOOD RIGHT FOREARM Performed at [facility], [address] [place], [place] [address]    Special Requests   Final    BOTTLES DRAWN AEROBIC AND ANAEROBIC Blood Culture adequate volume Performed at [facility], [address], [place], [place] [address]    Culture  Setup Time   Final    GRAM POSITIVE COCCI IN BOTH AEROBIC AND ANAEROBIC BOTTLES Gram Stain Report Called to,Read Back By and Verified With: L SAUNDERS AT [facility] AT 0812 ON [id number] BY S DALTON    Culture (A)  Final    STAPHYLOCOCCUS EPIDERMIDIS Sent to [facility] for further susceptibility testing. Performed at [facility], [address] [place], [place] [address]    Report Status PENDING  Incomplete    Organism ID, Bacteria STAPHYLOCOCCUS EPIDERMIDIS  Final      Susceptibility   Staphylococcus epidermidis - MIC*    CIPROFLOXACIN  >=8 RESISTANT Resistant     ERYTHROMYCIN >=8 RESISTANT Resistant     GENTAMICIN  <=0.5 SENSITIVE Sensitive     OXACILLIN >=4 RESISTANT Resistant     TETRACYCLINE 2 SENSITIVE Sensitive     VANCOMYCIN  1 SENSITIVE Sensitive     TRIMETH/SULFA 80 RESISTANT Resistant     CLINDAMYCIN >=8 RESISTANT Resistant     RIFAMPIN <=0.5 SENSITIVE Sensitive     Inducible Clindamycin NEGATIVE Sensitive     * STAPHYLOCOCCUS EPIDERMIDIS  Blood Culture (routine x 2)     Status: Abnormal   Collection Time: 02/05/24  2:43 PM   Specimen: BLOOD  Result Value Ref Range Status   Specimen Description   Final    BLOOD BLOOD LEFT ARM Performed at [facility], [address] [place], [place] [address]    Special Requests   Final    BOTTLES DRAWN AEROBIC AND ANAEROBIC Blood Culture adequate volume Performed at [facility], [address] [place], [place] [address]    Culture  Setup Time   Final    GRAM POSITIVE COCCI IN BOTH AEROBIC AND ANAEROBIC BOTTLES Gram Stain Report Called to,Read Back By and Verified With: L SAUNDERS AT [facility] AT 0812 ON [id number] BY S DALTON CRITICAL RESULT CALLED TO, READ BACK BY AND VERIFIED WITH: PHARMD DREW WOFFORD ON 02/06/24 @ 1517 BY DRT Performed at [facility], [address] [place], [place] [address]    Culture STAPHYLOCOCCUS EPIDERMIDIS (A)  Final   Report Status 02/08/2024 FINAL  Final   Organism ID, Bacteria STAPHYLOCOCCUS EPIDERMIDIS  Final      Susceptibility   Staphylococcus epidermidis - MIC*    CIPROFLOXACIN  >=8 RESISTANT Resistant     ERYTHROMYCIN >=8 RESISTANT Resistant     GENTAMICIN  <=0.5 SENSITIVE Sensitive     OXACILLIN >=4 RESISTANT Resistant     TETRACYCLINE 2 SENSITIVE Sensitive     VANCOMYCIN  1 SENSITIVE Sensitive     TRIMETH/SULFA 160 RESISTANT Resistant     CLINDAMYCIN >=8 RESISTANT Resistant     RIFAMPIN <=0.5  SENSITIVE Sensitive     Inducible Clindamycin NEGATIVE Sensitive     * STAPHYLOCOCCUS EPIDERMIDIS  Blood Culture ID Panel (Reflexed)     Status: Abnormal   Collection Time: 02/05/24  2:43 PM  Result Value Ref Range Status   Enterococcus faecalis NOT DETECTED NOT DETECTED Final   Enterococcus Faecium NOT DETECTED NOT DETECTED Final   Listeria monocytogenes NOT DETECTED NOT DETECTED Final   Staphylococcus species DETECTED (A) NOT DETECTED Final  Comment: CRITICAL RESULT CALLED TO, READ BACK BY AND VERIFIED WITH: PHARMD DREW WOFFORD ON 02/06/24 @ 1517 BY DRT    Staphylococcus aureus (BCID) NOT DETECTED NOT DETECTED Final   Staphylococcus epidermidis DETECTED (A) NOT DETECTED Final    Comment: Methicillin (oxacillin) resistant coagulase negative staphylococcus. Possible blood culture contaminant (unless isolated from more than one blood culture draw or clinical case suggests pathogenicity). No antibiotic treatment is indicated for blood  culture contaminants. CRITICAL RESULT CALLED TO, READ BACK BY AND VERIFIED WITH: PHARMD DREW WOFFORD ON 02/06/24 @ 1517 BY DRT    Staphylococcus lugdunensis NOT DETECTED NOT DETECTED Final   Streptococcus species NOT DETECTED NOT DETECTED Final   Streptococcus agalactiae NOT DETECTED NOT DETECTED Final   Streptococcus pneumoniae NOT DETECTED NOT DETECTED Final   Streptococcus pyogenes NOT DETECTED NOT DETECTED Final   A.calcoaceticus-baumannii NOT DETECTED NOT DETECTED Final   Bacteroides fragilis NOT DETECTED NOT DETECTED Final   Enterobacterales NOT DETECTED NOT DETECTED Final   Enterobacter cloacae complex NOT DETECTED NOT DETECTED Final   Escherichia coli NOT DETECTED NOT DETECTED Final   Klebsiella aerogenes NOT DETECTED NOT DETECTED Final   Klebsiella oxytoca NOT DETECTED NOT DETECTED Final   Klebsiella pneumoniae NOT DETECTED NOT DETECTED Final   Proteus species NOT DETECTED NOT DETECTED Final   Salmonella species NOT DETECTED NOT DETECTED  Final   Serratia marcescens NOT DETECTED NOT DETECTED Final   Haemophilus influenzae NOT DETECTED NOT DETECTED Final   Neisseria meningitidis NOT DETECTED NOT DETECTED Final   Pseudomonas aeruginosa NOT DETECTED NOT DETECTED Final   Stenotrophomonas maltophilia NOT DETECTED NOT DETECTED Final   Candida albicans NOT DETECTED NOT DETECTED Final   Candida auris NOT DETECTED NOT DETECTED Final   Candida glabrata NOT DETECTED NOT DETECTED Final   Candida krusei NOT DETECTED NOT DETECTED Final   Candida parapsilosis NOT DETECTED NOT DETECTED Final   Candida tropicalis NOT DETECTED NOT DETECTED Final   Cryptococcus neoformans/gattii NOT DETECTED NOT DETECTED Final   Methicillin resistance mecA/C DETECTED (A) NOT DETECTED Final    Comment: CRITICAL RESULT CALLED TO, READ BACK BY AND VERIFIED WITH: PHARMD DREW WOFFORD ON 02/06/24 @ 1517 BY DRT Performed at [facility], [address], [place], [place] [address]   Urine Culture     Status: Abnormal   Collection Time: 02/05/24  8:41 PM   Specimen: Urine, Cystoscope  Result Value Ref Range Status   Specimen Description   Final    URINE, RANDOM Performed at [facility], [address], [place], [place] [address]    Special Requests   Final    NONE Performed at [facility], [address], [place], [place] [address]    Culture MULTIPLE SPECIES PRESENT, SUGGEST RECOLLECTION (A)  Final   Report Status 02/07/2024 FINAL  Final  Culture, blood (Routine X 2) w Reflex to ID Panel     Status: None (Preliminary result)   Collection Time: 02/07/24 11:23 AM   Specimen: BLOOD RIGHT HAND  Result Value Ref Range Status   Specimen Description BLOOD RIGHT HAND  Final   Special Requests   Final    BOTTLES DRAWN AEROBIC ONLY Blood Culture results may not be optimal due to an inadequate volume of blood received in culture bottles   Culture   Final    NO GROWTH 4 DAYS Performed at [facility], [address] [place], [place] [address]    Report Status PENDING  Incomplete  Culture, blood (Routine X 2) w Reflex to ID Panel     Status: None (Preliminary result)   Collection Time: 02/07/24 11:27 AM   Specimen: BLOOD RIGHT HAND  Result Value Ref Range Status   Specimen Description BLOOD RIGHT HAND  Final   Special Requests   Final    BOTTLES DRAWN AEROBIC ONLY Blood Culture results may not be optimal due to an inadequate volume of blood received in culture bottles   Culture   Final    NO GROWTH 4 DAYS Performed at [facility], [address], [place], [place] [address]    Report Status PENDING  Incomplete  Body fluid culture w Gram Stain     Status: None (Preliminary result)   Collection Time: 02/10/24  5:11 PM   Specimen: Synovium; Synovial Fluid  Result Value Ref Range Status   Specimen Description   Final    SYNOVIAL Performed at [facility], [address] [place], [place] [address]    Special Requests   Final     R KNEE Performed at [facility], [address] [place], [place] [address]    Gram Stain   Final    ABUNDANT WBC PRESENT, PREDOMINANTLY PMN NO ORGANISMS SEEN    Culture   Final    NO GROWTH 2 DAYS Performed at [facility], [address], [place], [place] [address]    Report Status PENDING  Incomplete  Anaerobic culture w Gram Stain     Status: None (Preliminary result)   Collection Time: 02/10/24  5:11 PM   Specimen: Synovium; Synovial Fluid  Result Value Ref Range Status   Specimen Description   Final    SYNOVIAL Performed at [facility], [address] [place], [place] [address]    Special Requests   Final    R KNEE Performed at [facility], [address] [place], [place] [address]    Gram Stain   Final    FEW WBC PRESENT, PREDOMINANTLY PMN NO ORGANISMS SEEN Performed at [facility], [address], [place], [place] [address]    Culture PENDING   Incomplete   Report Status PENDING  Incomplete         Radiology Studies: No results found.        Scheduled Meds:  Chlorhexidine  Gluconate Cloth  6 each Topical Q0600   colchicine   0.6 mg Oral BID   cyanocobalamin   1,000 mcg Oral Daily   heparin   5,000 Units Subcutaneous Q8H   insulin  aspart  0-5 Units Subcutaneous QHS   insulin  aspart  0-6 Units Subcutaneous TID WC   metoprolol  succinate  25 mg Oral Daily   rosuvastatin   5 mg Oral Q Sat   senna-docusate  1 tablet Oral BID   sodium chloride  flush  3 mL Intravenous Q12H   tamsulosin   0.4 mg Oral Daily   triamcinolone  cream  1 Application Topical BID   Continuous Infusions:  DAPTOmycin  700 mg (02/11/24 1325)     LOS: 7 days    Time spent: 40 minutes    Toribio Hummer, MD [facility]   To contact the attending provider between 7A-7P or the covering provider during after hours 7P-7A, please log into the web site [email or web address] and access using universal [place] password for that web site. If you do not have the password, please call the hospital operator.  02/12/2024, 12:58 PM

## 2024-02-12 NOTE — Progress Notes (Signed)
 Physical Therapy Treatment Patient Details Name: Marcus Simpson MRN: [id number] DOB: 1945-04-05 Today's Date: 02/12/2024   History of Present Illness 78 yo male admitted with acute kidney injury, urinary tract obstruction. s/p ureteral stent 02/05/24.  Hx of DM, prostate Ca, ureteral stone.R knee x-ray 02/10/2024  Large suprapatellar knee joint effusion.  Tricompartmental osteoarthritis.    PT Comments  Pt seated in recliner.  He doubts he abilities but he is eager to try.  He performed increased mobility this session with min to CGA assistance.  He continues to complain of pain but reports pain is much improved from last PT session.  He lives at home with his wife who is able to assist.  He will benefit from HHPT at d/c to improve strength and endurance and to also reduce pain in his R knee.  Plan for HHPT at dc.    If plan is discharge home, recommend the following: Two people to help with walking and/or transfers;A lot of help with bathing/dressing/bathroom;Assistance with cooking/housework;Assist for transportation;Help with stairs or ramp for entrance   Can travel by private vehicle        Equipment Recommendations  None recommended by PT    Recommendations for Other Services       Precautions / Restrictions Precautions Precautions: Fall Restrictions Weight Bearing Restrictions Per Provider Order: No     Mobility  Bed Mobility               General bed mobility comments: oob in recliner    Transfers Overall transfer level: Needs assistance Equipment used: Rolling walker (2 wheels) Transfers: Sit to/from Stand Sit to Stand: Contact guard assist           General transfer comment: Cues for hand placement to and from seated surface this session.  Pt performed x 2 reps with good eccentric load.    Ambulation/Gait Ambulation/Gait assistance: Contact guard assist Gait Distance (Feet): 160 Feet Assistive device: Rolling walker (2 wheels) Gait  Pattern/deviations: Step-through pattern, Decreased stride length, Step-to pattern, Antalgic, Trunk flexed       General Gait Details: Cues for scap retraction and forward gaze this session.  Pt intially with step to pattern d/t pain.  He was able to progress to step through sequencing as gt continued this session.  Close chair follow as he was apprehensive of his abilities.  Noticeable fatigue at end of session.   Stairs             Wheelchair Mobility     Tilt Bed    Modified Rankin (Stroke Patients Only)       Balance Overall balance assessment: Mild deficits observed, not formally tested                                          Communication Communication Communication: No apparent difficulties  Cognition Arousal: Alert Behavior During Therapy: WFL for tasks assessed/performed   PT - Cognitive impairments: No apparent impairments                         Following commands: Intact      Cueing Cueing Techniques: Verbal cues  Exercises      General Comments        Pertinent Vitals/Pain Pain Assessment Pain Assessment: Faces Faces Pain Scale: Hurts little more Pain Location: R knee Pain Descriptors / Indicators: Aching,  Constant, Discomfort, Grimacing, Guarding Pain Intervention(s): Monitored during session, Repositioned    Home Living                          Prior Function            PT Goals (current goals can now be found in the care plan section) Acute Rehab PT Goals Patient Stated Goal: home soon Potential to Achieve Goals: Good Progress towards PT goals: Progressing toward goals    Frequency    Min 3X/week      PT Plan      Co-evaluation              AM-PAC PT 6 Clicks Mobility   Outcome Measure  Help needed turning from your back to your side while in a flat bed without using bedrails?: None Help needed moving from lying on your back to sitting on the side of a flat bed without  using bedrails?: None Help needed moving to and from a bed to a chair (including a wheelchair)?: A Little Help needed standing up from a chair using your arms (e.g., wheelchair or bedside chair)?: A Little Help needed to walk in hospital room?: A Little Help needed climbing 3-5 steps with a railing? : A Little 6 Click Score: 20    End of Session Equipment Utilized During Treatment: Gait belt Activity Tolerance: Patient limited by pain Patient left: in chair;with call bell/phone within reach;with family/visitor present Nurse Communication: Mobility status;Patient requests pain meds PT Visit Diagnosis: Muscle weakness (generalized) (M62.81);Unsteadiness on feet (R26.81)     Time: 8780-8754 PT Time Calculation (min) (ACUTE ONLY): 26 min  Charges:    $Gait Training: 8-22 mins $Therapeutic Activity: 8-22 mins PT General Charges $$ ACUTE PT VISIT: 1 Visit                     Toya HAMS , PTA Acute Rehabilitation Services Office [phone]    Toya JINNY Gosling 02/12/2024, 12:51 PM

## 2024-02-13 DIAGNOSIS — R7881 Bacteremia: Secondary | ICD-10-CM | POA: Diagnosis not present

## 2024-02-13 DIAGNOSIS — M10061 Idiopathic gout, right knee: Secondary | ICD-10-CM

## 2024-02-13 DIAGNOSIS — Z1611 Resistance to penicillins: Secondary | ICD-10-CM | POA: Diagnosis not present

## 2024-02-13 DIAGNOSIS — N201 Calculus of ureter: Secondary | ICD-10-CM | POA: Diagnosis not present

## 2024-02-13 DIAGNOSIS — N390 Urinary tract infection, site not specified: Secondary | ICD-10-CM | POA: Diagnosis not present

## 2024-02-13 LAB — BASIC METABOLIC PANEL WITH GFR
Anion gap: 15 (ref 5–15)
BUN: 37 mg/dL — ABNORMAL HIGH (ref 8–23)
CO2: 23 mmol/L (ref 22–32)
Calcium: 9.7 mg/dL (ref 8.9–10.3)
Chloride: 97 mmol/L — ABNORMAL LOW (ref 98–111)
Creatinine, Ser: 1.8 mg/dL — ABNORMAL HIGH (ref 0.61–1.24)
GFR, Estimated: 38 mL/min — ABNORMAL LOW (ref >60)
Glucose, Bld: 207 mg/dL — ABNORMAL HIGH (ref 70–99)
Potassium: 4 mmol/L (ref 3.5–5.1)
Sodium: 134 mmol/L — ABNORMAL LOW (ref 135–145)

## 2024-02-13 LAB — CBC WITH DIFFERENTIAL/PLATELET
Abs Immature Granulocytes: 0.13 K/uL — ABNORMAL HIGH (ref 0.00–0.07)
Basophils Absolute: 0 K/uL (ref 0.0–0.1)
Basophils Relative: 0 %
Eosinophils Absolute: 0 K/uL (ref 0.0–0.5)
Eosinophils Relative: 0 %
HCT: 33.8 % — ABNORMAL LOW (ref 39.0–52.0)
Hemoglobin: 11.2 g/dL — ABNORMAL LOW (ref 13.0–17.0)
Immature Granulocytes: 1 %
Lymphocytes Relative: 13 %
Lymphs Abs: 1.3 K/uL (ref 0.7–4.0)
MCH: 30.6 pg (ref 26.0–34.0)
MCHC: 33.1 g/dL (ref 30.0–36.0)
MCV: 92.3 fL (ref 80.0–100.0)
Monocytes Absolute: 0.3 K/uL (ref 0.1–1.0)
Monocytes Relative: 3 %
Neutro Abs: 8.7 K/uL — ABNORMAL HIGH (ref 1.7–7.7)
Neutrophils Relative %: 83 %
Platelets: 412 K/uL — ABNORMAL HIGH (ref 150–400)
RBC: 3.66 MIL/uL — ABNORMAL LOW (ref 4.22–5.81)
RDW: 13.3 % (ref 11.5–15.5)
WBC: 10.4 K/uL (ref 4.0–10.5)
nRBC: 0 % (ref 0.0–0.2)

## 2024-02-13 LAB — CULTURE, BLOOD (ROUTINE X 2)
Culture: NO GROWTH
Culture: NO GROWTH

## 2024-02-13 LAB — GLUCOSE, CAPILLARY
Glucose-Capillary: 169 mg/dL — ABNORMAL HIGH (ref 70–99)
Glucose-Capillary: 193 mg/dL — ABNORMAL HIGH (ref 70–99)

## 2024-02-13 MED ORDER — BLOOD GLUCOSE TEST VI STRP: 1.0000 | ORAL_STRIP | 0 refills | Status: AC

## 2024-02-13 MED ORDER — LANCETS MISC: 1.0000 | 0 refills | Status: AC

## 2024-02-13 MED ORDER — LANCET DEVICE MISC: 1.0000 | 0 refills | Status: DC

## 2024-02-13 MED ORDER — ONETOUCH ULTRA TEST VI STRP: 1.0000 | ORAL_STRIP | Freq: Every day | 0 refills | Status: AC

## 2024-02-13 MED ORDER — PREDNISONE 20 MG PO TABS
60.0000 mg | ORAL_TABLET | Freq: Once | ORAL | Status: AC
  Administered 2024-02-13: 60 mg via ORAL
  Filled 2024-02-13: qty 3

## 2024-02-13 MED ORDER — BLOOD GLUCOSE MONITORING SUPPL DEVI: 1.0000 | 0 refills | Status: DC

## 2024-02-13 MED ORDER — ACETAMINOPHEN 325 MG PO TABS: 650.0000 mg | ORAL_TABLET | Freq: Four times a day (QID) | ORAL | Status: DC | PRN

## 2024-02-13 MED ORDER — HEPARIN SOD (PORK) LOCK FLUSH 100 UNIT/ML IV SOLN
250.0000 [IU] | INTRAVENOUS | Status: AC | PRN
  Administered 2024-02-13: 250 [IU]
  Filled 2024-02-13: qty 2.5

## 2024-02-13 MED ORDER — COLCHICINE 0.6 MG PO TABS: 0.6000 mg | ORAL_TABLET | Freq: Two times a day (BID) | ORAL | 0 refills | Status: DC

## 2024-02-13 NOTE — Discharge Summary (Signed)
 Physician Discharge Summary  Marcus Simpson FMW:[id number] DOB: 1945-04-22 DOA: 02/05/2024  PCP: [name], MD  Admit date: 02/05/2024 Discharge date: 02/13/2024  Time spent: 60 minutes  Recommendations for Outpatient Follow-up:  Follow-up with Rosamond Leta B, MD in 1 to 2 weeks.  On follow-up patient will need a basic metabolic profile done to follow-up on electrolytes, renal function.  Patient's acute gout flare will need to be followed up upon. Follow-up with Dr. Sherrilee, urology in 1 to 2 weeks. Follow-up with Dr. Dennise, ID on 03/01/2024 at 9 AM. Patient was discharged home with home health services.   Discharge Diagnoses:  Principal Problem:   Acute kidney injury due to urinary tract obstruction Active Problems:   Hyperlipidemia   Essential hypertension   Prostate cancer (HCC)   Bacteremia due to methicillin resistant Staphylococcus epidermidis   Acute pyelonephritis   Left ureteral calculus   Anemia of chronic disease   Well controlled type 2 diabetes mellitus (HCC)   Bilateral lower extremity edema   Severe sepsis (HCC)   Effusion of right knee joint   Acute gout   Discharge Condition: Stable and improved.  Diet recommendation: Regular  Filed Weights   02/10/24 0500 02/11/24 0500 02/13/24 0500  Weight: 110.4 kg 110 kg 106.6 kg    History of present illness:  HPI per Dr.Khan Marcus Simpson is a 78 y.o. year old male with medical history of HTN, HLD, T2DM, prostate cancer, hx of ureteral stone presenting to the ED with nausea and vomiting after being started on treatment for UTI.    Pt states he was treated for UTI last week and his symptoms improved with treatment but he began having symptoms of nausea, vomiting when he woke up this morning and had fevers and chills as well.  He reports checking his temperature and it being over 101.  He states he has a urologist outpatient and received a call from them few days ago stating his urinalysis was positive for  a UTI and he was started on another antibiotic.     On arrival to the ED patient was noted to be HDS stable. Lab work and imaging. CBC shows leukocytosis at 25k, hgb at 11.8 that is baseline. CMP shows AKI with creatinine at 3.6, mild hyponatremia and slight AGMA. Lactic acid elevated at 3.0 which improved to 2.7. UA shows UTI and culture pending. CT shows findings consistent with cystitis and 6 mm obstructing stone. Urology consulted by EDP and pt underwent stent placement. [facility] contacted for admission.   Hospital Course:  #1 severe sepsis secondary to UTI/acute pyelonephritis, MRSE bacteremia/obstructing left ureteral stone - Patient on admission met criteria for sepsis with tachycardia, tachypnea, significant leukocytosis, lactic acidosis, UTI with obstructing left ureteral stone. - Patient pancultured with blood cultures positive for MRSE. - Patient seen in consultation by urology underwent cystoscopy and left ureteral stent placement on 02/05/2024. - Urology recommended continuation of Foley catheter for 3 to 5 days with outpatient follow-up with urology. -Patient underwent a voiding trial which was successful. - Patient noted to be on IV Rocephin , patient noted to have been started on Zyvox  on 02/06/2024. -Zyvox  and Rocephin  discontinued. -IV Vanco discontinued and patient placed on IV daptomycin  per ID recommendations. -Patient was discharged home on a course of IV daptomycin  as noted in problem #2. -Outpatient follow-up with urology. - See problem #2.   #2 MRSE bacteremia -Blood cultures noted 4/4 positive for MRSE. -2D echo done negative for vegetations. - Patient seen  in consultation by ID who reviewed blood cultures, sensitivities and recommended TEE to look for vegetation. - Per ID patient declined stating that he was 78 years old and did not want to undergo a TEE. - Per ID patient agreeable to empiric antibiotics for a course for endocarditis. - Repeat blood cultures obtained  02/07/2024 with no growth to date - Per ID, PICC line may be placed if repeat blood cultures from 02/07/2024 with no growth x 4 days. -PICC line subsequently placed, 02/10/2024. -IV vancomycin  was changed to IV daptomycin  per ID who are recommended 6 weeks of IV antibiotics from negative blood cultures on 02/07/2024 with EOT 12/30/ 2025. - IV Rocephin  discontinued. -Outpatient follow-up with ID.   3.  AKI/acute metabolic acidosis -Patient on presentation noted to have a creatinine of 3.61. - AKI likely postrenal azotemia secondary to obstructing left ureteral stone in the setting of Indocin. - Patient seen by urology underwent cystoscopy and left ureteral stent placement 02/05/2024. - Patient with good urine output during the hospitalization. - Renal function was slowly trending down and noted to be plateauing and creatinine was down to 1.80 from 3.61 on admissio - Last creatinine noted on 12/23/2023 at 1.46. - Patient noted with some lower extremity edema. -Patient noted to be auto diuresing during the hospitalization.   - Patient noted to have received a dose of Lasix  40 mg IV x 1.   - Acidosis improved and had resolved by day of discharge.   - Patient was discharged home in stable improved condition with close outpatient follow-up with PCP.    4.  Large right suprapatellar knee joint effusion secondary to acute gout flare. - Patient noted with complaints of right knee pain on 02/10/2024 with difficulty ambulating. -Patient with prior history of gout with flares usually in his hands per patient. - Plain films of the right knee were obtained which did show a large right suprapatellar knee joint effusion. - Patient with MRSE bacteremia and on IV daptomycin  during the hospitalization. -Leukocytosis initially was trending up and subsequently started to trend back down.  - Patient seen in consultation by orthopedics, patient underwent aspiration of the right knee per orthopedics on 02/10/2024  with no organisms seen with preliminary results..  - Aspirate straw-colored, hazy, WBC of 1470 with 93% neutrophils noted, 6% monocytes, intracellular monosodium urate crystals noted.  - Patient being followed by orthopedics who feel knee aspirate is not consistent with septic arthritis with no plans for surgery from a orthopedic standpoint.  -Patient started on colchicine . -Patient also received a dose of Solu-Medrol  60 mg IV x 1 as well as oral prednisone  60 mg p.o. with significant clinical improvement. -Patient was discharged home on colchicine . -Outpatient follow-up.   5. lactic acidosis -Resolved.   6.  Hypertension - Patient maintained on home regimen Toprol -XL.   7.  Hyperlipidemia - Patient maintained on home regimen statin.    8.  Prostate cancer - Patient maintained on home regimen tamsulosin .  - Outpatient follow-up with oncology and radiation oncology.   9.  Anemia of chronic disease -Hemoglobin remained stable 11.2 during the hospitalization.  Stable at 11.0.   10.  Well-controlled diabetes mellitus type 2 -Hemoglobin A1c 6.4 (12/23/2023). - Patient noted to be on glipizide prior to admission which was held during the hospitalization.   - Patient maintained on SSI.  -Oral hypoglycemic agents will be resumed on discharge.   11.  Lower extremity edema -Patient with noted lower extremity edema. - Patient noted to be  on IV fluids early on in the hospitalization. -IV fluids discontinued. -Patient noted to be also diuresing significantly during the hospitalization.   - Patient given a dose of IV Lasix .   - Outpatient follow-up with PCP.    12.  Obesity class I -BMI 34.71 kg/m. - Lifestyle modification. - Outpatient follow-up with PCP.  Procedures: CT abdomen and pelvis 02/05/2024 Chest x-ray 02/05/2024 2D echo 02/07/2024 Cystoscopy, left retrograde pyelogram with interpretation, left ureteral stent placement by urology: Dr. Shane 02/05/2024 Plain films of  the right knee 02/10/2024 PICC line 02/10/2024 Right knee aspiration per orthopedics: Jesse Jordan, PA 02/10/2024  Consultations: Urology: Dr. Shane 02/05/2024 ID: Dr. Dennise 02/07/2024 Orthopedics: Jessica Jordan, PA 02/10/2024  Discharge Exam: Vitals:   02/12/24 2101 02/13/24 0436  BP: (!) 152/71 (!) 159/76  Pulse: 80 74  Resp: 15 14  Temp: 98.1 F (36.7 C) 98 F (36.7 C)  SpO2: 96% 96%    General: NAD Cardiovascular: RRR no murmurs rubs or gallops.  1+ bilateral lower extremity edema. Respiratory: Clear to auscultation bilaterally.  No wheezes, no crackles, no rhonchi.  Fair air movement.  Speaking in full sentences.  Discharge Instructions   Discharge Instructions     [facility] pharmacist to adjust dose for Vancomycin , Aminoglycosides and other anti-infective therapies as requested by physician.   Complete by: As directed    [facility] to provide Cath Flo 2mg    Complete by: As directed    Administer for PICC line occlusion and as ordered by physician for other access device issues.   Anaphylaxis Kit: Provided to treat any anaphylactic reaction to the medication being provided to the patient if First Dose or when requested by physician   Complete by: As directed    Epinephrine  1mg /ml vial / amp: Administer 0.3mg  (0.32ml) subcutaneously once for moderate to severe anaphylaxis, nurse to call physician and pharmacy when reaction occurs and call 911 if needed for immediate care   Diphenhydramine  50mg /ml IV vial: Administer 25-50mg  IV/IM PRN for first dose reaction, rash, itching, mild reaction, nurse to call physician and pharmacy when reaction occurs   Sodium Chloride  0.9% NS 500ml IV: Administer if needed for hypovolemic blood pressure drop or as ordered by physician after call to physician with anaphylactic reaction   Change dressing on IV access line weekly and PRN   Complete by: As directed    Diet general   Complete by: As directed    Flush  IV access with Sodium Chloride  0.9% and Heparin  10 units/ml or 100 units/ml   Complete by: As directed    Home infusion instructions - [facility]   Complete by: As directed    Instructions: Flush IV access with Sodium Chloride  0.9% and Heparin  10units/ml or 100units/ml   Change dressing on IV access line: Weekly and PRN   Instructions Cath Flo 2mg : Administer for PICC Line occlusion and as ordered by physician for other access device   [facility] pharmacist to adjust dose for: Vancomycin , Aminoglycosides and other anti-infective therapies as requested by physician   Increase activity slowly   Complete by: As directed    Method of administration may be changed at the discretion of home infusion pharmacist based upon assessment of the patient and/or caregiver's ability to self-administer the medication ordered   Complete by: As directed    No wound care   Complete by: As directed       Allergies as of 02/13/2024       Reactions  Dilaudid [hydromorphone Hcl] Anaphylaxis, Other (See Comments)   Irregular heart rate        Medication List     PAUSE taking these medications    indomethacin 25 MG capsule Wait to take this until your doctor or other care provider tells you to start again. Commonly known as: INDOCIN Take 25 mg by mouth 2 (two) times daily with a meal.   nitrofurantoin  (macrocrystal-monohydrate) 100 MG capsule Wait to take this until your doctor or other care provider tells you to start again. Commonly known as: MACROBID  Take 1 capsule (100 mg total) by mouth every 12 (twelve) hours.       STOP taking these medications    ciprofloxacin  500 MG tablet Commonly known as: CIPRO        TAKE these medications    acetaminophen  325 MG tablet Commonly known as: TYLENOL  Take 2 tablets (650 mg total) by mouth every 6 (six) hours as needed for mild pain (pain score 1-3) or fever (or Fever >/= 101).   aspirin  81 MG chewable tablet Chew 81  mg by mouth daily.   Blood Glucose Monitoring Suppl Devi 1 each by Does not apply route as directed. Dispense based on patient and insurance preference. Use up to four times daily as directed. (FOR ICD-10 E10.9, E11.9).   colchicine  0.6 MG tablet Take 1 tablet (0.6 mg total) by mouth 2 (two) times daily for 10 days.   cyanocobalamin  1000 MCG tablet Commonly known as: VITAMIN B12 Take 1,000 mcg by mouth daily.   daptomycin  IVPB Commonly known as: CUBICIN  Inject 700 mg into the vein daily. Indication:  MRSE bacteremia First Dose: Yes Last Day of Therapy:  03/20/24 Labs - Once weekly:  CBC/D, BMP, and CPK Labs - Once weekly: ESR and CRP Method of administration: IV Push Method of administration may be changed at the discretion of home infusion pharmacist based upon assessment of the patient and/or caregiver's ability to self-administer the medication ordered.   glipiZIDE 2.5 MG 24 hr tablet Commonly known as: GLUCOTROL XL Take 2.5 mg by mouth daily.   Lancet Device Misc 1 each by Does not apply route as directed. Dispense based on patient and insurance preference. Use up to four times daily as directed. (FOR ICD-10 E10.9, E11.9).   Lancets Misc 1 each by Does not apply route as directed. Dispense based on patient and insurance preference. Use up to four times daily as directed. (FOR ICD-10 E10.9, E11.9).   meclizine  25 MG tablet Commonly known as: ANTIVERT  Take 25 mg by mouth 3 (three) times daily as needed for dizziness.   metoprolol  succinate 25 MG 24 hr tablet Commonly known as: TOPROL -XL Take 25 mg by mouth daily.   OneTouch Ultra Test test strip Generic drug: glucose blood 1 each by Other route daily. What changed: how to take this   BLOOD GLUCOSE TEST STRIPS Strp 1 each by Does not apply route as directed. Dispense based on patient and insurance preference. Use up to four times daily as directed. (FOR ICD-10 E10.9, E11.9). What changed: You were already taking a  medication with the same name, and this prescription was added. Make sure you understand how and when to take each.   oxyCODONE -acetaminophen  5-325 MG tablet Commonly known as: PERCOCET/ROXICET Take 1 tablet by mouth every 6 (six) hours as needed for severe pain (pain score 7-10).   rosuvastatin  5 MG tablet Commonly known as: CRESTOR  Take 5 mg by mouth every Saturday.   silver sulfADIAZINE 1 % cream  Commonly known as: SILVADENE Apply 1 Application topically 2 (two) times daily.   tamsulosin  0.4 MG Caps capsule Commonly known as: FLOMAX  Take 1 capsule (0.4 mg total) by mouth daily.   traMADol  50 MG tablet Commonly known as: ULTRAM  Take 50 mg by mouth 3 (three) times daily as needed for moderate pain (pain score 4-6).   triamcinolone  cream 0.1 % Commonly known as: KENALOG  Apply 1 Application topically 2 (two) times daily.               Discharge Care Instructions  (From admission, onward)           Start     Ordered   02/10/24 0000  Change dressing on IV access line weekly and PRN  (Home infusion instructions - [facility] )        02/10/24 1224           Allergies  Allergen Reactions   Dilaudid [Hydromorphone Hcl] Anaphylaxis and Other (See Comments)    Irregular heart rate    Contact information for follow-up providers     [facility] ([facility] Follow up.   Specialty: DME Services Why: Marcus Simpson will provide nursing and IV antibiotics in the home after discharge. Contact information: [address] [place]  [address] [phone]        [name], MD. Schedule an appointment as soon as possible for a visit in 1 week(s).   Specialty: Internal Medicine Why: Follow-up in 1 to 2 weeks. Contact information: [address] [place] [address] [phone]         Sherrilee Belvie CROME, MD. Schedule an appointment as soon as possible for a visit in 2 week(s).   Specialty:  Urology Why: Follow-up in 1 to 2 weeks. Contact information: [address] [place] [address] [phone]         Dennise Kingsley, MD Follow up on 03/01/2024.   Specialty: Infectious Diseases Why: Follow-up at 9 AM. Contact information: [address], [address] [place] [address] [phone]              Contact information for after-discharge care     Home Medical Care     [facility]) .   Service: Home Health Services Why: Marcus Simpson will provide PT and OT in the home after discharge. Contact information: [address]   [phone]                      The results of significant diagnostics from this hospitalization (including imaging, microbiology, ancillary and laboratory) are listed below for reference.    Significant Diagnostic Studies: DG Knee Complete 4 Views Right Result Date: 02/10/2024 EXAM: 4 OR MORE VIEW(S) XRAY OF THE KNEE 02/10/2024 10:42:00 AM COMPARISON: None available. CLINICAL HISTORY: 136[address] Swelling [phone] FINDINGS: BONES AND JOINTS: No acute fracture. No focal osseous lesion. No joint dislocation. Mild tri-compartment degenerative changes. Chondrocalcinosis. Large suprapatellar knee joint effusion . SOFT TISSUES: The soft tissues are unremarkable. IMPRESSION: 1. Large suprapatellar knee joint effusion. 2. Tricompartmental osteoarthritis. Electronically signed by: Waddell Calk MD 02/10/2024 02:07 PM EST RP Workstation: [id number]   US  EKG SITE RITE Result Date: 02/10/2024 If Site Rite image not attached, placement could not be confirmed due to current cardiac rhythm.  US  EKG SITE RITE Result Date: 02/10/2024 If Site Rite image not attached, placement could not be confirmed due  to current cardiac rhythm.  ECHOCARDIOGRAM COMPLETE Result Date: 02/07/2024    ECHOCARDIOGRAM REPORT   Patient Name:   Marcus Simpson Thai Date of Exam: 02/07/2024 Medical Rec #:   [id number]         Height:       71.0 in Accession #:    [id number]        Weight:       249.1 lb Date of Birth:  04-10-1945          BSA:          2.315 m Patient Age:    78 years          BP:           127/67 mmHg Patient Gender: M                 HR:           66 bpm. Exam Location:  Inpatient Procedure: 2D Echo and Intracardiac Opacification Agent (Both Spectral and Color            Flow Doppler were utilized during procedure). Indications:    Endocarditis  History:        Patient has no prior history of Echocardiogram examinations.  Sonographer:    Charmaine Gaskins Referring Phys: [id number] [facility] IMPRESSIONS  1. NO obvious vegetations Recommend TEE if clinically indicated.  2. Left ventricular ejection fraction, by estimation, is 60 to 65%. The left ventricle has normal function. The left ventricle has no regional wall motion abnormalities. Left ventricular diastolic parameters were normal.  3. Right ventricular systolic function is normal. The right ventricular size is normal.  4. The mitral valve is normal in structure. Trivial mitral valve regurgitation.  5. The aortic valve is normal in structure. Aortic valve regurgitation is not visualized.  6. Aortic dilatation noted. There is mild dilatation of the ascending aorta, measuring 41 mm. FINDINGS  Left Ventricle: Left ventricular ejection fraction, by estimation, is 60 to 65%. The left ventricle has normal function. The left ventricle has no regional wall motion abnormalities. Definity  contrast agent was given IV to delineate the left ventricular  endocardial borders. The left ventricular internal cavity size was normal in size. There is no left ventricular hypertrophy. Left ventricular diastolic parameters were normal. Right Ventricle: The right ventricular size is normal. Right vetricular wall thickness was not assessed. Right ventricular systolic function is normal. Left Atrium: Left atrial size was normal in size. Right Atrium: Right atrial size was  normal in size. Pericardium: There is no evidence of pericardial effusion. Mitral Valve: The mitral valve is normal in structure. Trivial mitral valve regurgitation. Tricuspid Valve: The tricuspid valve is normal in structure. Tricuspid valve regurgitation is mild. Aortic Valve: The aortic valve is normal in structure. Aortic valve regurgitation is not visualized. Pulmonic Valve: The pulmonic valve was normal in structure. Pulmonic valve regurgitation is mild. Aorta: The aortic root is normal in size and structure and aortic dilatation noted. There is mild dilatation of the ascending aorta, measuring 41 mm. IAS/Shunts: No atrial level shunt detected by color flow Doppler.  LEFT VENTRICLE PLAX 2D LVIDd:         4.90 cm   Diastology LVIDs:         2.80 cm   LV e' medial:    6.64 cm/s LV PW:         0.90 cm   LV E/e' medial:  11.8 LV IVS:  0.90 cm   LV e' lateral:   7.18 cm/s LVOT diam:     2.20 cm   LV E/e' lateral: 10.9 LVOT Area:     3.80 cm  RIGHT VENTRICLE RV Basal diam:  3.30 cm RV Mid diam:    3.50 cm RV S prime:     14.10 cm/s LEFT ATRIUM             Index        RIGHT ATRIUM           Index LA Vol (A2C):   78.4 ml 33.87 ml/m  RA Area:     16.80 cm LA Vol (A4C):   71.1 ml 30.71 ml/m  RA Volume:   41.60 ml  17.97 ml/m LA Biplane Vol: 75.6 ml 32.66 ml/m   AORTA Ao Root diam: 3.50 cm Ao Asc diam:  4.10 cm MITRAL VALVE               TRICUSPID VALVE MV Area (PHT): 3.38 cm    TR Peak grad:   29.4 mmHg MV E velocity: 78.40 cm/s  TR Vmax:        271.00 cm/s MV A velocity: 87.80 cm/s MV E/A ratio:  0.89        SHUNTS                            Systemic Diam: 2.20 cm Vina Gull MD Electronically signed by Vina Gull MD Signature Date/Time: 02/07/2024/2:08:38 PM    Final    DG C-Arm 1-60 Min-No Report Result Date: 02/05/2024 Fluoroscopy was utilized by the requesting physician.  No radiographic interpretation.   CT ABDOMEN PELVIS WO CONTRAST Result Date: 02/05/2024 CLINICAL DATA:  Kidney infection and  is on antibiotics, presenting with fever. EXAM: CT ABDOMEN AND PELVIS WITHOUT CONTRAST TECHNIQUE: Multidetector CT imaging of the abdomen and pelvis was performed following the standard protocol without IV contrast. RADIATION DOSE REDUCTION: This exam was performed according to the departmental dose-optimization program which includes automated exposure control, adjustment of the mA and/or kV according to patient size and/or use of iterative reconstruction technique. COMPARISON:  October 30, 2023 FINDINGS: Lower chest: Mild lingular and left basilar atelectasis is seen. Hepatobiliary: No focal liver abnormality is seen. No gallstones, gallbladder wall thickening, or biliary dilatation. Pancreas: Unremarkable. No pancreatic ductal dilatation or surrounding inflammatory changes. Spleen: Normal in size without focal abnormality. Adrenals/Urinary Tract: Adrenal glands are unremarkable. Kidneys are normal size. Stable bilateral parenchymal and peripelvic renal cysts are seen. Multiple subcentimeter non-obstructing renal calculi of various sizes are seen within both kidneys. A 6 mm obstructing renal calculus is seen within the proximal to mid left ureter with mild left-sided hydronephrosis and hydroureter. There is mild to moderate severity diffuse urinary bladder wall thickening. Stomach/Bowel: Stomach is within normal limits. Appendix appears normal. No evidence of bowel wall thickening, distention, or inflammatory changes. Vascular/Lymphatic: Aortic atherosclerosis. No enlarged abdominal or pelvic lymph nodes. 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 Other: No abdominal wall hernia or abnormality. No abdominopelvic ascites. Musculoskeletal: Multilevel degenerative changes are seen throughout the lumbar spine. IMPRESSION: 1. 6 mm obstructing renal calculus within the proximal to mid left ureter. 2. Multiple bilateral subcentimeter  non-obstructing renal calculi. 3. Stable bilateral parenchymal and peripelvic renal cysts. No follow-up imaging is recommended. This recommendation follows ACR consensus guidelines: Management of the Incidental Renal Mass on CT: A White  Paper of the ACR Incidental Findings Committee. J Am Coll Radiol [id number]. 4. Mild to moderate severity diffuse urinary bladder wall thickening which may represent sequelae associated with cystitis. Correlation with urinalysis is recommended. 5. 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 6. Aortic atherosclerosis. Electronically Signed   By: Suzen Dials M.D.   On: 02/05/2024 17:01   DG Chest Port 1 View Result Date: 02/05/2024 CLINICAL DATA:  Possible sepsis.  Kidney infection on antibiotics. EXAM: PORTABLE CHEST 1 VIEW COMPARISON:  10/30/2023, 12/29/2022 FINDINGS: Lungs are hypoinflated with chronic lateral left base opacification. No acute airspace process or effusion. Cardiomediastinal silhouette and remainder of the exam is unchanged. IMPRESSION: Hypoinflation without acute cardiopulmonary disease. Electronically Signed   By: Toribio Agreste M.D.   On: 02/05/2024 15:07    Microbiology: Recent Results (from the past 240 hours)  MIC (1 Drug)-Blood culture; 02/05/2024; BLOOD RIGHT FOREARM; MRSE; Daptomycin      Status: Abnormal   Collection Time: 02/05/24 10:31 AM   Specimen: BLOOD RIGHT FOREARM  Result Value Ref Range Status   Min Inhibitory Conc (1 Drug) Final report (A)  Corrected    Comment: (NOTE) Performed At: [facility] [employer] [address] [place], [place] [id number] Jennette Shorter MD Ey:[phone] CORRECTED ON 11/23 AT 1535: PREVIOUSLY REPORTED AS Preliminary report    Source BLOOD  Final    Comment: Performed at [facility], [address] [place], [place] [address]  MIC Result     Status: Abnormal   Collection Time: 02/05/24 10:31 AM  Result Value Ref Range Status    Result 1 (MIC) Comment (A)  Final    Comment: (NOTE) Methicillin - resistant Staphylococcus aureus Identification performed by account, not confirmed by this laboratory. Testing performed by broth microdilution. DAPTOMYCIN  <=0.5ug/ml SUSCEPTIBLE Performed At: [facility] [address] [place], [place] [id number] Jennette Shorter MD Ey:[phone]   Resp panel by RT-PCR (RSV, Flu A&B, Covid) Anterior Nasal Swab     Status: None   Collection Time: 02/05/24  1:58 PM   Specimen: Anterior Nasal Swab  Result Value Ref Range Status   SARS Coronavirus 2 by RT PCR NEGATIVE NEGATIVE Final    Comment: (NOTE) SARS-CoV-2 target nucleic acids are NOT DETECTED.  The SARS-CoV-2 RNA is generally detectable in upper respiratory specimens during the acute phase of infection. The lowest concentration of SARS-CoV-2 viral copies this assay can detect is 138 copies/mL. A negative result does not preclude SARS-Cov-2 infection and should not be used as the sole basis for treatment or other patient management decisions. A negative result may occur with  improper specimen collection/handling, submission of specimen other than nasopharyngeal swab, presence of viral mutation(s) within the areas targeted by this assay, and inadequate number of viral copies(<138 copies/mL). A negative result must be combined with clinical observations, patient history, and epidemiological information. The expected result is Negative.  Fact Sheet for Patients:  bloggercourse.com  Fact Sheet for Healthcare Providers:  seriousbroker.it  This test is no t yet approved or cleared by the [place]  FDA and  has been authorized for detection and/or diagnosis of SARS-CoV-2 by FDA under an Emergency Use Authorization (EUA). This EUA will remain  in effect (meaning this test can be used) for the duration of the COVID-19 declaration under Section 564(b)(1) of the Act,  21 U.S.C.section 360bbb-3(b)(1), unless the authorization is terminated  or revoked sooner.       Influenza A by PCR NEGATIVE NEGATIVE Final  Influenza B by PCR NEGATIVE NEGATIVE Final    Comment: (NOTE) The Xpert Xpress SARS-CoV-2/FLU/RSV plus assay is intended as an aid in the diagnosis of influenza from Nasopharyngeal swab specimens and should not be used as a sole basis for treatment. Nasal washings and aspirates are unacceptable for Xpert Xpress SARS-CoV-2/FLU/RSV testing.  Fact Sheet for Patients: bloggercourse.com  Fact Sheet for Healthcare Providers: seriousbroker.it  This test is not yet approved or cleared by the [place]  FDA and has been authorized for detection and/or diagnosis of SARS-CoV-2 by FDA under an Emergency Use Authorization (EUA). This EUA will remain in effect (meaning this test can be used) for the duration of the COVID-19 declaration under Section 564(b)(1) of the Act, 21 U.S.C. section 360bbb-3(b)(1), unless the authorization is terminated or revoked.     Resp Syncytial Virus by PCR NEGATIVE NEGATIVE Final    Comment: (NOTE) Fact Sheet for Patients: bloggercourse.com  Fact Sheet for Healthcare Providers: seriousbroker.it  This test is not yet approved or cleared by the [place]  FDA and has been authorized for detection and/or diagnosis of SARS-CoV-2 by FDA under an Emergency Use Authorization (EUA). This EUA will remain in effect (meaning this test can be used) for the duration of the COVID-19 declaration under Section 564(b)(1) of the Act, 21 U.S.C. section 360bbb-3(b)(1), unless the authorization is terminated or revoked.  Performed at [facility], [address] [place], [place] [address]   Urine Culture     Status: Abnormal   Collection Time: 02/05/24  2:05 PM   Specimen: Urine, Random  Result Value Ref Range Status   Specimen  Description   Final    URINE, RANDOM Performed at [facility], [address] [place], [place] [address]    Special Requests   Final    NONE Reflexed from [phone] Performed at [facility], [address] [place], [place] [address]    Culture MULTIPLE SPECIES PRESENT, SUGGEST RECOLLECTION (A)  Final   Report Status 02/07/2024 FINAL  Final  Blood Culture (routine x 2)     Status: Abnormal (Preliminary result)   Collection Time: 02/05/24  2:09 PM   Specimen: BLOOD  Result Value Ref Range Status   Specimen Description   Final    BLOOD BLOOD RIGHT FOREARM Performed at [facility], [address], [place], [place] [address]    Special Requests   Final    BOTTLES DRAWN AEROBIC AND ANAEROBIC Blood Culture adequate volume Performed at [facility], [address], [place], [place] [address]    Culture  Setup Time   Final    GRAM POSITIVE COCCI IN BOTH AEROBIC AND ANAEROBIC BOTTLES Gram Stain Report Called to,Read Back By and Verified With: L SAUNDERS AT [facility] AT 0812 ON [id number] BY S DALTON    Culture (A)  Final    STAPHYLOCOCCUS EPIDERMIDIS Sent to [facility] for further susceptibility testing. Performed at [facility], [address] [place], [place] [address]    Report Status PENDING  Incomplete   Organism ID, Bacteria STAPHYLOCOCCUS EPIDERMIDIS  Final      Susceptibility   Staphylococcus epidermidis - MIC*    CIPROFLOXACIN  >=8 RESISTANT Resistant     ERYTHROMYCIN >=8 RESISTANT Resistant     GENTAMICIN  <=0.5 SENSITIVE Sensitive     OXACILLIN >=4 RESISTANT Resistant     TETRACYCLINE 2 SENSITIVE Sensitive     VANCOMYCIN  1 SENSITIVE Sensitive     TRIMETH/SULFA 80 RESISTANT Resistant     CLINDAMYCIN >=8 RESISTANT Resistant  RIFAMPIN <=0.5 SENSITIVE Sensitive     Inducible Clindamycin NEGATIVE Sensitive     * STAPHYLOCOCCUS EPIDERMIDIS  Blood Culture (routine x 2)     Status: Abnormal   Collection Time: 02/05/24  2:43 PM   Specimen: BLOOD  Result Value Ref Range Status    Specimen Description   Final    BLOOD BLOOD LEFT ARM Performed at [facility], [address], [place], [place] [address]    Special Requests   Final    BOTTLES DRAWN AEROBIC AND ANAEROBIC Blood Culture adequate volume Performed at [facility], [address], [place], [place] [address]    Culture  Setup Time   Final    GRAM POSITIVE COCCI IN BOTH AEROBIC AND ANAEROBIC BOTTLES Gram Stain Report Called to,Read Back By and Verified With: L SAUNDERS AT [facility] AT 0812 ON [id number] BY S DALTON CRITICAL RESULT CALLED TO, READ BACK BY AND VERIFIED WITH: PHARMD DREW WOFFORD ON 02/06/24 @ 1517 BY DRT Performed at [facility], [address] [place], [place] [address]    Culture STAPHYLOCOCCUS EPIDERMIDIS (A)  Final   Report Status 02/08/2024 FINAL  Final   Organism ID, Bacteria STAPHYLOCOCCUS EPIDERMIDIS  Final      Susceptibility   Staphylococcus epidermidis - MIC*    CIPROFLOXACIN  >=8 RESISTANT Resistant     ERYTHROMYCIN >=8 RESISTANT Resistant     GENTAMICIN  <=0.5 SENSITIVE Sensitive     OXACILLIN >=4 RESISTANT Resistant     TETRACYCLINE 2 SENSITIVE Sensitive     VANCOMYCIN  1 SENSITIVE Sensitive     TRIMETH/SULFA 160 RESISTANT Resistant     CLINDAMYCIN >=8 RESISTANT Resistant     RIFAMPIN <=0.5 SENSITIVE Sensitive     Inducible Clindamycin NEGATIVE Sensitive     * STAPHYLOCOCCUS EPIDERMIDIS  Blood Culture ID Panel (Reflexed)     Status: Abnormal   Collection Time: 02/05/24  2:43 PM  Result Value Ref Range Status   Enterococcus faecalis NOT DETECTED NOT DETECTED Final   Enterococcus Faecium NOT DETECTED NOT DETECTED Final   Listeria monocytogenes NOT DETECTED NOT DETECTED Final   Staphylococcus species DETECTED (A) NOT DETECTED Final    Comment: CRITICAL RESULT CALLED TO, READ BACK BY AND VERIFIED WITH: PHARMD DREW WOFFORD ON 02/06/24 @ 1517 BY DRT    Staphylococcus aureus (BCID) NOT DETECTED NOT DETECTED Final   Staphylococcus epidermidis DETECTED (A) NOT DETECTED Final     Comment: Methicillin (oxacillin) resistant coagulase negative staphylococcus. Possible blood culture contaminant (unless isolated from more than one blood culture draw or clinical case suggests pathogenicity). No antibiotic treatment is indicated for blood  culture contaminants. CRITICAL RESULT CALLED TO, READ BACK BY AND VERIFIED WITH: PHARMD DREW WOFFORD ON 02/06/24 @ 1517 BY DRT    Staphylococcus lugdunensis NOT DETECTED NOT DETECTED Final   Streptococcus species NOT DETECTED NOT DETECTED Final   Streptococcus agalactiae NOT DETECTED NOT DETECTED Final   Streptococcus pneumoniae NOT DETECTED NOT DETECTED Final   Streptococcus pyogenes NOT DETECTED NOT DETECTED Final   A.calcoaceticus-baumannii NOT DETECTED NOT DETECTED Final   Bacteroides fragilis NOT DETECTED NOT DETECTED Final   Enterobacterales NOT DETECTED NOT DETECTED Final   Enterobacter cloacae complex NOT DETECTED NOT DETECTED Final   Escherichia coli NOT DETECTED NOT DETECTED Final   Klebsiella aerogenes NOT DETECTED NOT DETECTED Final   Klebsiella oxytoca NOT DETECTED NOT DETECTED Final   Klebsiella pneumoniae NOT DETECTED NOT DETECTED Final   Proteus species NOT DETECTED NOT DETECTED Final   Salmonella species NOT DETECTED  NOT DETECTED Final   Serratia marcescens NOT DETECTED NOT DETECTED Final   Haemophilus influenzae NOT DETECTED NOT DETECTED Final   Neisseria meningitidis NOT DETECTED NOT DETECTED Final   Pseudomonas aeruginosa NOT DETECTED NOT DETECTED Final   Stenotrophomonas maltophilia NOT DETECTED NOT DETECTED Final   Candida albicans NOT DETECTED NOT DETECTED Final   Candida auris NOT DETECTED NOT DETECTED Final   Candida glabrata NOT DETECTED NOT DETECTED Final   Candida krusei NOT DETECTED NOT DETECTED Final   Candida parapsilosis NOT DETECTED NOT DETECTED Final   Candida tropicalis NOT DETECTED NOT DETECTED Final   Cryptococcus neoformans/gattii NOT DETECTED NOT DETECTED Final   Methicillin resistance mecA/C  DETECTED (A) NOT DETECTED Final    Comment: CRITICAL RESULT CALLED TO, READ BACK BY AND VERIFIED WITH: PHARMD DREW WOFFORD ON 02/06/24 @ 1517 BY DRT Performed at [facility], [address] [place], [place] [address]   Urine Culture     Status: Abnormal   Collection Time: 02/05/24  8:41 PM   Specimen: Urine, Cystoscope  Result Value Ref Range Status   Specimen Description   Final    URINE, RANDOM Performed at [facility], [address], [place], [place] [address]    Special Requests   Final    NONE Performed at [facility], [address], [place], [place] [address]    Culture MULTIPLE SPECIES PRESENT, SUGGEST RECOLLECTION (A)  Final   Report Status 02/07/2024 FINAL  Final  Culture, blood (Routine X 2) w Reflex to ID Panel     Status: None   Collection Time: 02/07/24 11:23 AM   Specimen: BLOOD RIGHT HAND  Result Value Ref Range Status   Specimen Description BLOOD RIGHT HAND  Final   Special Requests   Final    BOTTLES DRAWN AEROBIC ONLY Blood Culture results may not be optimal due to an inadequate volume of blood received in culture bottles   Culture   Final    NO GROWTH 6 DAYS Performed at [facility], [address] [place], [place] [address]    Report Status 02/13/2024 FINAL  Final  Culture, blood (Routine X 2) w Reflex to ID Panel     Status: None   Collection Time: 02/07/24 11:27 AM   Specimen: BLOOD RIGHT HAND  Result Value Ref Range Status   Specimen Description BLOOD RIGHT HAND  Final   Special Requests   Final    BOTTLES DRAWN AEROBIC ONLY Blood Culture results may not be optimal due to an inadequate volume of blood received in culture bottles   Culture   Final    NO GROWTH 6 DAYS Performed at [facility], [address], [place], [place] [address]    Report Status 02/13/2024 FINAL  Final  Body fluid culture w Gram Stain     Status: None (Preliminary result)   Collection Time: 02/10/24  5:11 PM    Specimen: Synovium; Synovial Fluid  Result Value Ref Range Status   Specimen Description   Final    SYNOVIAL Performed at [facility], [address] [place], [place] [address]    Special Requests   Final     R KNEE Performed at [facility], [address] [place], [place] [address]    Gram Stain   Final    ABUNDANT WBC PRESENT, PREDOMINANTLY PMN NO ORGANISMS SEEN    Culture   Final    NO GROWTH 3  DAYS Performed at [facility], [address], [place], [place] [address]    Report Status PENDING  Incomplete  Anaerobic culture w Gram Stain     Status: None (Preliminary result)   Collection Time: 02/10/24  5:11 PM   Specimen: Synovium; Synovial Fluid  Result Value Ref Range Status   Specimen Description   Final    SYNOVIAL Performed at [facility] [address] [place], [place] [address]    Special Requests   Final    R KNEE Performed at [facility], [address], [place], [place] [address]    Gram Stain   Final    FEW WBC PRESENT, PREDOMINANTLY PMN NO ORGANISMS SEEN Performed at [facility], [address] [place], [place] [address]    Culture   Final    NO ANAEROBES ISOLATED; CULTURE IN PROGRESS FOR 5 DAYS   Report Status PENDING  Incomplete     Labs: Basic Metabolic Panel: Recent Labs  Lab 02/07/24 0545 02/08/24 0537 02/09/24 0558 02/10/24 0524 02/11/24 0542 02/12/24 0312 02/13/24 0244  NA 133* 135 138 136 136 134* 134*  K 4.4 4.0 4.2 4.2 4.3 4.0 4.0  CL 97* 101 104 100 101 98 97*  CO2 22 19* 19* 21* 23 23 23   GLUCOSE 136* 139* 158* 156* 145* 150* 207*  BUN 48* 46* 39* 28* 23 24* 37*  CREATININE 2.50* 2.11* 1.88* 1.72* 1.68* 1.69* 1.80*  CALCIUM  9.0 8.8* 9.3 9.6 9.2 9.4 9.7  MG 2.1 2.2  --   --   --   --   --   PHOS  --   --   --   --  3.4 3.3  --    Liver Function Tests: Recent Labs  Lab 02/11/24 0542 02/12/24 0312  ALBUMIN 3.5 3.5   No results  for input(s): LIPASE, AMYLASE in the last 168 hours. No results for input(s): AMMONIA in the last 168 hours. CBC: Recent Labs  Lab 02/08/24 0537 02/09/24 0558 02/10/24 0524 02/11/24 0542 02/12/24 0312 02/13/24 0244  WBC 13.0* 12.9* 16.8* 17.0* 17.0* 10.4  NEUTROABS 9.3* 8.4*  --  12.0* 12.1* 8.7*  HGB 11.0* 11.0* 11.7* 10.6* 11.0* 11.2*  HCT 33.5* 34.1* 36.2* 32.6* 33.7* 33.8*  MCV 93.1 93.4 93.5 92.6 93.1 92.3  PLT 317 314 377 346 369 412*   Cardiac Enzymes: Recent Labs  Lab 02/09/24 0558  CKTOTAL 55   BNP: BNP (last 3 results) No results for input(s): BNP in the last 8760 hours.  ProBNP (last 3 results) No results for input(s): PROBNP in the last 8760 hours.  CBG: Recent Labs  Lab 02/12/24 1135 02/12/24 1618 02/12/24 2145 02/13/24 0712 02/13/24 1118  GLUCAP 158* 225* 210* 169* 193*       Signed:  Toribio Hummer MD.  [facility] 02/13/2024, 3:40 PM

## 2024-02-13 NOTE — Progress Notes (Signed)
 Reviewed dc instructions with Marcus Simpson and wife including medications and follow up appointments. Marcus Simpson and wife verbalized understanding. Ailed Defibaugh, Lonell Louder, RN

## 2024-02-13 NOTE — Plan of Care (Signed)
  Problem: Education: Goal: Knowledge of General Education information will improve Description: Including pain rating scale, medication(s)/side effects and non-pharmacologic comfort measures Outcome: Adequate for Discharge   Problem: Activity: Goal: Risk for activity intolerance will decrease Outcome: Adequate for Discharge   

## 2024-02-13 NOTE — Progress Notes (Addendum)
 [facility]  Date of Admission:  02/05/2024   Principal Problem:   Acute kidney injury due to urinary tract obstruction Active Problems:   Hyperlipidemia   Essential hypertension   Prostate cancer (HCC)   Bacteremia due to methicillin resistant Staphylococcus epidermidis   Acute pyelonephritis   Left ureteral calculus   Anemia of chronic disease   Well controlled type 2 diabetes mellitus (HCC)   Bilateral lower extremity edema   Severe sepsis (HCC)   Effusion of right knee joint   Acute gout          Assessment: 78 year old male with history of prostate cancer, hypertension hyperlipidemia, ureteral stones, being treated for UTI with Macrobid  at home presenting with nausea vomiting found to have #MRSE bacteremia in the setting of stones, unclear source possible IE #UTI in the setting of ureteral stone status post stent placement #Large right knee super patellar joint effusion secondary to acute gout flare - Patient afebrile on admission, WBC 25K.  Started on ceftriaxone .  CT abdomen pelvis showed left ureteral stone that was obstructive as well as urinary bladder thickening concerning for cystitis.  Urology engaged patient underwent cystoscopy with left ureteral stent placement.  Urine sent for cultures from the OR multiple species growing.  ID engaged given positive blood cultures -TTE no vegetation, patient declining TEE.  Will treat empirically for endocarditis. - Developed knee pain x-ray on 02/10/2024 showed large suprapatellar knee joint effusion Recommendations:  - Continue daptomycin  plan on 6 weeks from negative blood cultures on 11/18 EOT 12/30  - Orthopedics and # for right knee, patient underwent aspiration with Intracellular monosodium urate crystals noted, 1.4 K WBC consistent with gout.  Patient on colchicine .  Knee pain/swelling significantly improved today - Relayed to primary - Standard precaution -ID will sign off OPAT  ORDERS:  Diagnosis: MRSE bacteremia  Allergies  Allergen Reactions   Dilaudid [Hydromorphone Hcl] Anaphylaxis and Other (See Comments)    Irregular heart rate     Discharge antibiotics to be given via PICC line:  Per pharmacy protocol Daptomycin  700 mg IV every 24 hours    Duration: 6 weeks End Date: 12/30  [facility] Care Per Protocol with [employer] Use: Home health RN for IV administration and teaching, line care and labs.    Labs weekly while on IV antibiotics: x__ CBC with differential __ BMP **TWICE WEEKLY ON VANCOMYCIN   x__ CMP _x_ CRP _x_ ESR __ Vancomycin  trough TWICE WEEKLY _x_ CK  __ Please pull PIC at completion of IV antibiotics __ Please leave PIC in place until doctor has seen patient or been notified  Fax weekly labs to [phone]  Clinic Follow Up Appt: 12/30  @ [facility] with Dennise   Microbiology:   Antibiotics: Ceftriaxone  9/16-18 Linezolid  11/17-18 Vancomycin  11/8-present Cultures: Blood 11/16 2/2 sets stpah epi 11/18 p Urine 11/16 multiple species SUBJECTIVE: Sitting in chair.  No new complaints.  Reports knee feels much better today.  Inquiring about discharge. Interval:  Afebrile overnight.   Review of Systems: Review of Systems  All other systems reviewed and are negative.    Scheduled Meds:  Chlorhexidine  Gluconate Cloth  6 each Topical Q0600   colchicine   0.6 mg Oral BID   cyanocobalamin   1,000 mcg Oral Daily   heparin   5,000 Units Subcutaneous Q8H   insulin  aspart  0-5 Units Subcutaneous QHS   insulin  aspart  0-6 Units Subcutaneous TID WC   metoprolol  succinate  25 mg  Oral Daily   rosuvastatin   5 mg Oral Q Sat   senna-docusate  1 tablet Oral BID   sodium chloride  flush  3 mL Intravenous Q12H   tamsulosin   0.4 mg Oral Daily   triamcinolone  cream  1 Application Topical BID   Continuous Infusions:  DAPTOmycin  700 mg (02/13/24 1242)   PRN Meds:.acetaminophen  **OR** acetaminophen , bisacodyl , heparin  lock flush, meclizine ,  Muscle Rub, oxyCODONE , polyethylene glycol, sodium chloride  flush Allergies  Allergen Reactions   Dilaudid [Hydromorphone Hcl] Anaphylaxis and Other (See Comments)    Irregular heart rate    OBJECTIVE: Vitals:   02/12/24 1412 02/12/24 2101 02/13/24 0436 02/13/24 0500  BP: (!) 148/80 (!) 152/71 (!) 159/76   Pulse: 91 80 74   Resp: 16 15 14    Temp: 98.9 F (37.2 C) 98.1 F (36.7 C) 98 F (36.7 C)   TempSrc: Oral Oral Oral   SpO2: 98% 96% 96%   Weight:    106.6 kg  Height:       Body mass index is 32.79 kg/m.  Physical Exam Constitutional:      General: He is not in acute distress.    Appearance: He is normal weight. He is not toxic-appearing.  HENT:     Head: Normocephalic and atraumatic.     Right Ear: External ear normal.     Left Ear: External ear normal.     Nose: No congestion or rhinorrhea.     Mouth/Throat:     Mouth: Mucous membranes are moist.     Pharynx: Oropharynx is clear.  Eyes:     Extraocular Movements: Extraocular movements intact.     Conjunctiva/sclera: Conjunctivae normal.     Pupils: Pupils are equal, round, and reactive to light.  Cardiovascular:     Rate and Rhythm: Normal rate and regular rhythm.     Heart sounds: No murmur heard.    No friction rub. No gallop.  Pulmonary:     Effort: Pulmonary effort is normal.     Breath sounds: Normal breath sounds.  Abdominal:     General: Abdomen is flat. Bowel sounds are normal.     Palpations: Abdomen is soft.  Musculoskeletal:        General: No swelling. Normal range of motion.     Cervical back: Normal range of motion and neck supple.     Comments: Knee improved.   Skin:    General: Skin is warm and dry.  Neurological:     General: No focal deficit present.     Mental Status: He is oriented to person, place, and time.  Psychiatric:        Mood and Affect: Mood normal.       Lab Results Lab Results  Component Value Date   WBC 10.4 02/13/2024   HGB 11.2 (L) 02/13/2024   HCT 33.8 (L)  02/13/2024   MCV 92.3 02/13/2024   PLT 412 (H) 02/13/2024    Lab Results  Component Value Date   CREATININE 1.80 (H) 02/13/2024   BUN 37 (H) 02/13/2024   NA 134 (L) 02/13/2024   K 4.0 02/13/2024   CL 97 (L) 02/13/2024   CO2 23 02/13/2024    Lab Results  Component Value Date   ALT 12 02/05/2024   AST 19 02/05/2024   ALKPHOS 92 02/05/2024   BILITOT 1.1 02/05/2024        Loney Stank, MD [facility] 02/13/2024, 12:55 PM Evaluation of this patient requires  complex antimicrobial therapy evaluation and counseling + isolation needs for disease transmission risk assessment and mitigation

## 2024-02-13 NOTE — TOC Transition Note (Signed)
 Transition of Care [facility]) - Discharge Note  Patient Details  Name: Marcus Simpson MRN: [id number] Date of Birth: 03-Sep-1945  Transition of [facility]) CM/SW Contact:  Duwaine GORMAN [name], LCSW Phone Number: 02/13/2024, 12:52 PM  Clinical Narrative: Patient will now need HHPT/OT. CSW made [facility] referral in hub, which was accepted by [facility]. HHPT/OT placed by hospitalist. Patient will discharge home today. CSW updated Pam with [facility]. Care management signing off.  Final next level of care: Home w Home Health Services Barriers to Discharge: Barriers Resolved  Patient Goals and CMS Choice Patient states their goals for this hospitalization and ongoing recovery are:: Home CMS Medicare.gov Compare Post Acute Care list provided to:: Patient Choice offered to / list presented to : Patient  Discharge Plan and Services Additional resources added to the After Visit Summary for   In-house Referral: Clinical Social Work Post Acute Care Choice: Home Health          DME Arranged: N/A DME Agency: NA HH Arranged: RN, PT, OT, IV Antibiotics HH Agency: [facility] Date [facility] Agency Contacted: 02/09/24 Representative spoke with at [facility] Agency: Pam  Social Drivers of Health (SDOH) Interventions SDOH Screenings   Food Insecurity: No Food Insecurity (02/05/2024)  Housing: Low Risk  (02/05/2024)  Transportation Needs: No Transportation Needs (02/05/2024)  Utilities: Not At Risk (02/05/2024)  Alcohol Screen: Low Risk  (10/11/2023)  Depression (PHQ2-9): Low Risk  (01/19/2024)  Social Connections: Moderately Isolated (02/05/2024)  Tobacco Use: Medium Risk (02/05/2024)   Readmission Risk Interventions     No data to display

## 2024-02-14 LAB — BODY FLUID CULTURE W GRAM STAIN: Culture: NO GROWTH

## 2024-02-14 LAB — PATHOLOGIST SMEAR REVIEW: Path Review: ELEVATED

## 2024-02-15 LAB — ANAEROBIC CULTURE W GRAM STAIN

## 2024-02-15 LAB — CULTURE, BLOOD (ROUTINE X 2): Special Requests: ADEQUATE

## 2024-02-18 ENCOUNTER — Emergency Department (HOSPITAL_COMMUNITY)

## 2024-02-18 ENCOUNTER — Inpatient Hospital Stay (HOSPITAL_COMMUNITY)

## 2024-02-18 ENCOUNTER — Inpatient Hospital Stay (HOSPITAL_COMMUNITY)
Admission: EM | Admit: 2024-02-18 | Discharge: 2024-02-23 | DRG: 871 | Disposition: A | Attending: Pulmonary Disease | Admitting: Pulmonary Disease

## 2024-02-18 DIAGNOSIS — R112 Nausea with vomiting, unspecified: Secondary | ICD-10-CM | POA: Diagnosis not present

## 2024-02-18 DIAGNOSIS — R0689 Other abnormalities of breathing: Secondary | ICD-10-CM | POA: Diagnosis not present

## 2024-02-18 DIAGNOSIS — N132 Hydronephrosis with renal and ureteral calculous obstruction: Secondary | ICD-10-CM | POA: Diagnosis present

## 2024-02-18 DIAGNOSIS — A419 Sepsis, unspecified organism: Secondary | ICD-10-CM | POA: Diagnosis present

## 2024-02-18 DIAGNOSIS — R531 Weakness: Secondary | ICD-10-CM | POA: Diagnosis not present

## 2024-02-18 DIAGNOSIS — R7881 Bacteremia: Secondary | ICD-10-CM | POA: Diagnosis present

## 2024-02-18 DIAGNOSIS — B954 Other streptococcus as the cause of diseases classified elsewhere: Secondary | ICD-10-CM | POA: Diagnosis not present

## 2024-02-18 DIAGNOSIS — Z743 Need for continuous supervision: Secondary | ICD-10-CM | POA: Diagnosis not present

## 2024-02-18 DIAGNOSIS — I959 Hypotension, unspecified: Secondary | ICD-10-CM | POA: Diagnosis not present

## 2024-02-18 DIAGNOSIS — N179 Acute kidney failure, unspecified: Secondary | ICD-10-CM

## 2024-02-18 DIAGNOSIS — R000 Tachycardia, unspecified: Secondary | ICD-10-CM | POA: Diagnosis not present

## 2024-02-18 DIAGNOSIS — M25461 Effusion, right knee: Secondary | ICD-10-CM | POA: Diagnosis not present

## 2024-02-18 DIAGNOSIS — B965 Pseudomonas (aeruginosa) (mallei) (pseudomallei) as the cause of diseases classified elsewhere: Secondary | ICD-10-CM | POA: Insufficient documentation

## 2024-02-18 DIAGNOSIS — Z1629 Resistance to other single specified antibiotic: Secondary | ICD-10-CM | POA: Diagnosis present

## 2024-02-18 LAB — POCT I-STAT EG7
Acid-base deficit: 1 mmol/L (ref 0.0–2.0)
Bicarbonate: 23.7 mmol/L (ref 20.0–28.0)
Calcium, Ion: 1.09 mmol/L — ABNORMAL LOW (ref 1.15–1.40)
HCT: 30 % — ABNORMAL LOW (ref 39.0–52.0)
Hemoglobin: 10.2 g/dL — ABNORMAL LOW (ref 13.0–17.0)
O2 Saturation: 64 %
Patient temperature: 38.1
Potassium: 4.9 mmol/L (ref 3.5–5.1)
Sodium: 132 mmol/L — ABNORMAL LOW (ref 135–145)
TCO2: 25 mmol/L (ref 22–32)
pCO2, Ven: 40 mmHg — ABNORMAL LOW (ref 44–60)
pH, Ven: 7.386 (ref 7.25–7.43)
pO2, Ven: 36 mmHg (ref 32–45)

## 2024-02-18 LAB — BASIC METABOLIC PANEL WITH GFR
Anion gap: 13 (ref 5–15)
BUN: 37 mg/dL — ABNORMAL HIGH (ref 8–23)
CO2: 21 mmol/L — ABNORMAL LOW (ref 22–32)
Calcium: 7.9 mg/dL — ABNORMAL LOW (ref 8.9–10.3)
Chloride: 97 mmol/L — ABNORMAL LOW (ref 98–111)
Creatinine, Ser: 2.83 mg/dL — ABNORMAL HIGH (ref 0.61–1.24)
GFR, Estimated: 22 mL/min — ABNORMAL LOW (ref >60)
Glucose, Bld: 119 mg/dL — ABNORMAL HIGH (ref 70–99)
Potassium: 4.6 mmol/L (ref 3.5–5.1)
Sodium: 131 mmol/L — ABNORMAL LOW (ref 135–145)

## 2024-02-18 LAB — URINALYSIS, W/ REFLEX TO CULTURE (INFECTION SUSPECTED)
Bilirubin Urine: NEGATIVE
Glucose, UA: NEGATIVE mg/dL
Ketones, ur: NEGATIVE mg/dL
Nitrite: POSITIVE — AB
Protein, ur: >300 mg/dL — AB
Specific Gravity, Urine: 1.02 (ref 1.005–1.030)
WBC, UA: >50 WBC/hpf (ref 0–5)
pH: 6 (ref 5.0–8.0)

## 2024-02-18 LAB — RESP PANEL BY RT-PCR (RSV, FLU A&B, COVID)  RVPGX2
Influenza A by PCR: NEGATIVE
Influenza B by PCR: NEGATIVE
Resp Syncytial Virus by PCR: NEGATIVE
SARS Coronavirus 2 by RT PCR: NEGATIVE

## 2024-02-18 LAB — COMPREHENSIVE METABOLIC PANEL WITH GFR
ALT: 29 U/L (ref 0–44)
AST: 32 U/L (ref 15–41)
Albumin: 3 g/dL — ABNORMAL LOW (ref 3.5–5.0)
Alkaline Phosphatase: 62 U/L (ref 38–126)
Anion gap: 18 — ABNORMAL HIGH (ref 5–15)
BUN: 33 mg/dL — ABNORMAL HIGH (ref 8–23)
CO2: 15 mmol/L — ABNORMAL LOW (ref 22–32)
Calcium: 7.3 mg/dL — ABNORMAL LOW (ref 8.9–10.3)
Chloride: 106 mmol/L (ref 98–111)
Creatinine, Ser: 2.38 mg/dL — ABNORMAL HIGH (ref 0.61–1.24)
GFR, Estimated: 27 mL/min — ABNORMAL LOW (ref >60)
Glucose, Bld: 122 mg/dL — ABNORMAL HIGH (ref 70–99)
Potassium: 3.7 mmol/L (ref 3.5–5.1)
Sodium: 138 mmol/L (ref 135–145)
Total Bilirubin: 1.3 mg/dL — ABNORMAL HIGH (ref 0.0–1.2)
Total Protein: 5.9 g/dL — ABNORMAL LOW (ref 6.5–8.1)

## 2024-02-18 LAB — MRSA NEXT GEN BY PCR, NASAL: MRSA by PCR Next Gen: NOT DETECTED

## 2024-02-18 LAB — LACTIC ACID, PLASMA
Lactic Acid, Venous: 5.6 mmol/L (ref 0.5–1.9)
Lactic Acid, Venous: 7.2 mmol/L (ref 0.5–1.9)
Lactic Acid, Venous: 5 mmol/L (ref 0.5–1.9)

## 2024-02-18 LAB — PROTIME-INR
INR: 1.3 — ABNORMAL HIGH (ref 0.8–1.2)
Prothrombin Time: 16.4 s — ABNORMAL HIGH (ref 11.4–15.2)

## 2024-02-18 LAB — BRAIN NATRIURETIC PEPTIDE: B Natriuretic Peptide: 312.3 pg/mL — ABNORMAL HIGH (ref 0.0–100.0)

## 2024-02-18 LAB — GLUCOSE, CAPILLARY: Glucose-Capillary: 92 mg/dL (ref 70–99)

## 2024-02-18 MED ORDER — METHOCARBAMOL 500 MG PO TABS
500.0000 mg | ORAL_TABLET | Freq: Three times a day (TID) | ORAL | Status: DC
  Administered 2024-02-18 – 2024-02-23 (×14): 500 mg via ORAL
  Filled 2024-02-18 (×14): qty 1

## 2024-02-18 MED ORDER — CHLORHEXIDINE GLUCONATE CLOTH 2 % EX PADS
6.0000 | MEDICATED_PAD | Freq: Every day | CUTANEOUS | Status: DC
  Administered 2024-02-19 – 2024-02-23 (×5): 6 via TOPICAL

## 2024-02-18 MED ORDER — VANCOMYCIN HCL 1500 MG/300ML IV SOLN: 1500.0000 mg | INTRAVENOUS | Status: DC

## 2024-02-18 MED ORDER — LACTATED RINGERS IV BOLUS (SEPSIS)
1000.0000 mL | Freq: Once | INTRAVENOUS | Status: AC
Start: 2024-02-18 — End: 2024-02-18
  Administered 2024-02-18: 1000 mL via INTRAVENOUS

## 2024-02-18 MED ORDER — SODIUM CHLORIDE 0.9 % IV SOLN: INTRAVENOUS | Status: AC

## 2024-02-18 MED ORDER — LACTATED RINGERS IV BOLUS (SEPSIS)
500.0000 mL | Freq: Once | INTRAVENOUS | Status: AC
Start: 2024-02-18 — End: 2024-02-18
  Administered 2024-02-18: 500 mL via INTRAVENOUS

## 2024-02-18 MED ORDER — PIPERACILLIN-TAZOBACTAM 3.375 G IVPB
3.3750 g | Freq: Three times a day (TID) | INTRAVENOUS | Status: DC
  Administered 2024-02-19 – 2024-02-20 (×4): 3.375 g via INTRAVENOUS
  Filled 2024-02-18 (×5): qty 50

## 2024-02-18 MED ORDER — NOREPINEPHRINE 4 MG/250ML-% IV SOLN
0.0000 ug/min | INTRAVENOUS | Status: DC
  Administered 2024-02-19: 5 ug/min via INTRAVENOUS
  Filled 2024-02-18: qty 250

## 2024-02-18 MED ORDER — ACETAMINOPHEN 500 MG PO TABS
1000.0000 mg | ORAL_TABLET | Freq: Three times a day (TID) | ORAL | Status: DC
  Administered 2024-02-18 – 2024-02-23 (×13): 1000 mg via ORAL
  Filled 2024-02-18 (×14): qty 2

## 2024-02-18 MED ORDER — ONDANSETRON HCL 4 MG/2ML IJ SOLN: 4.0000 mg | Freq: Four times a day (QID) | INTRAMUSCULAR | Status: DC | PRN

## 2024-02-18 MED ORDER — LACTATED RINGERS IV SOLN
INTRAVENOUS | Status: DC
  Administered 2024-02-18: 1000 mL via INTRAVENOUS

## 2024-02-18 MED ORDER — PIPERACILLIN-TAZOBACTAM 3.375 G IVPB 30 MIN
3.3750 g | Freq: Once | INTRAVENOUS | Status: AC
  Administered 2024-02-18: 3.375 g via INTRAVENOUS
  Filled 2024-02-18: qty 50

## 2024-02-18 MED ORDER — LIDOCAINE 5 % EX PTCH
1.0000 | MEDICATED_PATCH | CUTANEOUS | Status: DC
  Administered 2024-02-18 – 2024-02-22 (×5): 1 via TRANSDERMAL
  Filled 2024-02-18 (×4): qty 1

## 2024-02-18 MED ORDER — VANCOMYCIN HCL 1500 MG/300ML IV SOLN
1500.0000 mg | Freq: Once | INTRAVENOUS | Status: AC
  Administered 2024-02-19: 1500 mg via INTRAVENOUS
  Filled 2024-02-18: qty 300

## 2024-02-18 MED ORDER — SODIUM CHLORIDE 0.9 % IV SOLN: 2.0000 g | Freq: Once | INTRAVENOUS | Status: DC

## 2024-02-18 MED ORDER — HEPARIN SODIUM (PORCINE) 5000 UNIT/ML IJ SOLN
5000.0000 [IU] | Freq: Three times a day (TID) | INTRAMUSCULAR | Status: DC
  Administered 2024-02-18 – 2024-02-22 (×13): 5000 [IU] via SUBCUTANEOUS
  Filled 2024-02-18 (×14): qty 1

## 2024-02-18 MED ORDER — INSULIN ASPART 100 UNIT/ML IJ SOLN
0.0000 [IU] | INTRAMUSCULAR | Status: DC
  Administered 2024-02-19 – 2024-02-21 (×4): 2 [IU] via SUBCUTANEOUS
  Filled 2024-02-18 (×4): qty 2

## 2024-02-18 MED ORDER — VANCOMYCIN HCL IN DEXTROSE 1-5 GM/200ML-% IV SOLN
1000.0000 mg | Freq: Once | INTRAVENOUS | Status: AC
  Administered 2024-02-18: 1000 mg via INTRAVENOUS
  Filled 2024-02-18: qty 200

## 2024-02-18 MED ORDER — POLYETHYLENE GLYCOL 3350 17 G PO PACK: 17.0000 g | PACK | Freq: Every day | ORAL | Status: DC | PRN

## 2024-02-18 MED ORDER — OXYCODONE HCL 5 MG PO TABS
5.0000 mg | ORAL_TABLET | Freq: Four times a day (QID) | ORAL | Status: DC | PRN
  Administered 2024-02-19: 5 mg via ORAL
  Filled 2024-02-18: qty 1

## 2024-02-18 MED ORDER — CALCIUM GLUCONATE-NACL 2-0.675 GM/100ML-% IV SOLN
2.0000 g | Freq: Once | INTRAVENOUS | Status: AC
  Administered 2024-02-19: 2000 mg via INTRAVENOUS
  Filled 2024-02-18: qty 100

## 2024-02-18 NOTE — ED Notes (Signed)
 Spoke with c-link room ready at [place] [phone] .

## 2024-02-18 NOTE — H&P (Signed)
 NAME:  Marcus Simpson, MRN:  [id number], DOB:  1945/10/08, LOS: 0 ADMISSION DATE:  02/18/2024, CONSULTATION DATE:  02/18/24 REFERRING MD:  Cleotilde NOVAK, CHIEF COMPLAINT:  Sepsis   History of Present Illness:  Thedford Bunton is an 78 year old male with past medical history significant for HTN, DM2, hx nephrolithiasis, asymptomatic bradycardia, prostate cancer who presented to [facility] ED 11/29 septic; with c/o weakness, febrile to 102.2, hypotensive 70/50 with recent 2 week hospitalization for MRSE bacteremia in setting of obstructing stone s/p ureteral stent placement. In the ED he was given 3.5L LR with improvement in BP, cultures drawn, and started empirically on Vanc/Zosyn. Patient was recently admitted November 16-26 for sepsis and discharged on Zyvox  and then placed on 6-week course of daptomycin  via PICC line. PCCM consulted for ICU admission.  Pertinent Medical History:   Past Medical History:  Diagnosis Date   Arthritis    Bradycardia    asymptomatic    Diabetes mellitus without complication (HCC)    History of kidney stones    Hypertension    Prostate cancer (HCC)    Significant Hospital Events: Including procedures, antibiotic start and stop dates in addition to other pertinent events   Transfer from [facility]; septic shock; no pressors; responsive to fluids, started on Vanc/Zosyn  Interim History / Subjective:  PCCM consulted for ICU admission  Objective    Blood pressure (!) 105/54, pulse 91, temperature 98.3 F (36.8 C), temperature source Oral, resp. rate (!) 32, height 5' 11 (1.803 m), weight 106.6 kg, SpO2 96%.        Intake/Output Summary (Last 24 hours) at 02/18/2024 2052 Last data filed at 02/18/2024 1956 Gross per 24 hour  Intake 2201.05 ml  Output --  Net 2201.05 ml   Filed Weights   02/18/24 1813  Weight: 106.6 kg   Examination: General: acutely-ill male lying in bed, in NAD HEENT: AT/[place], PERRL, 3mm bilaterally, mmm Pulm: CTAB, slight inc wob, oxygenating  well on RA CV: RRR, no m/g/r GI: soft, rounded, non tender to palpation Extremities: ecchymosis to posterior left upper arm, PICC in RUE Neuro: A&O x3, no focal deficits   Labs/images reviewed No family at bedside. Wife is home per patient.  Resolved Problem List:   Assessment and Plan:   Septic shock; unspecified; hypotension, lactic acidosis, leukocytosis -Received 2.5L LR -Maintain MAP >65; Levophed ordered -Lactic 7.2; continue to trend until cleared -Cultures as below; Abx as below -Will rpt TTE -CXR unremarkable -KUB pending; evaluate mild inc wob; recent N/V -TTE 11/18: LVEF 60%; normal biventricular size/fxn, no wma, no vegetation  HTN HLD -Hold home antihypertensives -Takes Crestor  every Saturday  Anemia of chronic disease -Hgb stable -No acute signs of bleeding -Trend cbc  AKI Hx prostate cancer S/p L ureteral stent for obstructing stone -CT A/P: stable L ureteral stone 5mm; stable left ureteral stent, diffuse bladder wall thickening c/f cystitis, stable bilateral renal calculi, no hydropnephrosis -Urology following; recommend foley for max decompression; no acute surgical intervention -sCr 2.38; (1.8 on 11/24) -BUN 33 -Goal uop 0.81ml/kg/hr -Monitor I&O -Monitor renal indices -Avoid nephrotoxic agents  Leukocytosis; left shift; c/w reactive leukemoid response Febrile Recent hospitalization for MRSE bacteremia in setting of obstructing ureteral stones, acute pyelonephritis, c/f IE Acute gout; right knee -WBC 63.9k; (10.4k on 11/24) -Tmax 102.2 -Recently discharged on empiric 6-week course daptomycin  for IE (TTE negative; negative blood cx 11/18) via PICC line w/ EOT 12/30; concerned dapto was not effective -Consider line holiday; PICC potential source -Recent acute  gout flare (right knee) on colchicine  -Started empirically on Vanc/Zosyn 11/29; narrow with speciation -f/u blood cultures -f/u UA -RVP negative -MRSA nares pending -Continue to trend  WBC/fever curve  PAD Right lower back/flank pain; likely secondary to cystitis vs AKI -Tylenol /methocarbamol  scheduled -Oxy 5mg  q6 PRN  DM2 -Hold home glipizide -CBG q4 -SSI -A1C 6.4 (12/3023)  FEN -mIVF LR -NPO -Replace electrolytes as indicated; goal K >4; P >3; Mg >2  Labs:  CBC: Recent Labs  Lab 02/12/24 0312 02/13/24 0244 02/18/24 1825  WBC 17.0* 10.4 63.9*  NEUTROABS 12.1* 8.7* 51.5*  HGB 11.0* 11.2* 11.9*  HCT 33.7* 33.8* 36.5*  MCV 93.1 92.3 93.1  PLT 369 412* 422*    Basic Metabolic Panel: Recent Labs  Lab 02/12/24 0312 02/13/24 0244 02/18/24 1825  NA 134* 134* 138  K 4.0 4.0 3.7  CL 98 97* 106  CO2 23 23 15*  GLUCOSE 150* 207* 122*  BUN 24* 37* 33*  CREATININE 1.69* 1.80* 2.38*  CALCIUM  9.4 9.7 7.3*  PHOS 3.3  --   --    GFR: Estimated Creatinine Clearance: 30.7 mL/min (A) (by C-G formula based on SCr of 2.38 mg/dL (H)). Recent Labs  Lab 02/12/24 0312 02/13/24 0244 02/18/24 1825  WBC 17.0* 10.4 63.9*  LATICACIDVEN  --   --  5.6*    Liver Function Tests: Recent Labs  Lab 02/12/24 0312 02/18/24 1825  AST  --  32  ALT  --  29  ALKPHOS  --  62  BILITOT  --  1.3*  PROT  --  5.9*  ALBUMIN 3.5 3.0*   No results for input(s): LIPASE, AMYLASE in the last 168 hours. No results for input(s): AMMONIA in the last 168 hours.  ABG    Component Value Date/Time   PHART 7.3 (L) 12/29/2022 0025   PCO2ART 31 (L) 12/29/2022 0025   PO2ART 101 12/29/2022 0025   HCO3 15.3 (L) 12/29/2022 0025   ACIDBASEDEF 10.0 (H) 12/29/2022 0025   O2SAT 96.7 12/29/2022 0025     Coagulation Profile: Recent Labs  Lab 02/18/24 1825  INR 1.3*    Cardiac Enzymes: No results for input(s): CKTOTAL, CKMB, CKMBINDEX, TROPONINI in the last 168 hours.  HbA1C: Hgb A1c MFr Bld  Date/Time Value Ref Range Status  12/23/2023 01:30 PM 6.4 (H) 4.8 - 5.6 % Final    Comment:    (NOTE) Diagnosis of Diabetes The following HbA1c ranges recommended by  the American Diabetes Association (ADA) may be used as an aid in the diagnosis of diabetes mellitus.  Hemoglobin             Suggested A1C NGSP%              Diagnosis  <5.7                   Non Diabetic  5.7-6.4                Pre-Diabetic  >6.4                   Diabetic  <7.0                   Glycemic control for                       adults with diabetes.    12/29/2022 05:24 AM 6.8 (H) 4.8 - 5.6 % Final    Comment:    (NOTE) Pre diabetes:  5.7%-6.4%  Diabetes:              >6.4%  Glycemic control for   <7.0% adults with diabetes     CBG: Recent Labs  Lab 02/12/24 1135 02/12/24 1618 02/12/24 2145 02/13/24 0712 02/13/24 1118  GLUCAP 158* 225* 210* 169* 193*    Review of Systems:   Review of systems completed with pertinent positives/negatives outlined in above HPI.  Past Medical History:  He,  has a past medical history of Arthritis, Bradycardia, Diabetes mellitus without complication (HCC), History of kidney stones, Hypertension, and Prostate cancer (HCC).   Surgical History:   Past Surgical History:  Procedure Laterality Date   COLONOSCOPY     CYSTOSCOPY WITH STENT PLACEMENT Right 12/28/2022   Procedure: CYSTOSCOPY WITH STENT PLACEMENT;  Surgeon: Carolee Sherwood [name], MD;  Location: [facility] OR;  Service: Urology;  Laterality: Right;   CYSTOSCOPY WITH STENT PLACEMENT Left 02/05/2024   Procedure: CYSTOSCOPY, WITH STENT INSERTION. URETHRAL DILATION;  Surgeon: Shane Steffan BROCKS, MD;  Location: [facility] ORS;  Service: Urology;  Laterality: Left;   EXTRACORPOREAL SHOCK WAVE LITHOTRIPSY Right 01/25/2023   Procedure: EXTRACORPOREAL SHOCK WAVE LITHOTRIPSY (ESWL);  Surgeon: [name], MD;  Location: [facility] ORS;  Service: Urology;  Laterality: Right;   FLEXIBLE SIGMOIDOSCOPY N/A 04/02/2015   Procedure: FLEXIBLE SIGMOIDOSCOPY;  Surgeon: Claudis RAYMOND Rivet, MD;  Location: [facility] SUITE;  Service: Endoscopy;  Laterality: N/A;  1:45 - moved to 1/11 @ 8:30 - Ann to notify    KIDNEY STONE SURGERY     RADIOACTIVE SEED IMPLANT N/A 12/29/2023   Procedure: INSERTION, RADIATION SOURCE, PROSTATE;  Surgeon: Sherrilee Belvie CROME, MD;  Location: [facility] ORS;  Service: Urology;  Laterality: N/A;   SPACE OAR INSTILLATION N/A 12/29/2023   Procedure: INJECTION, HYDROGEL SPACER;  Surgeon: Sherrilee Belvie CROME, MD;  Location: [facility] ORS;  Service: Urology;  Laterality: N/A;   TOTAL KNEE ARTHROPLASTY Left 05/02/2017   Procedure: LEFT TOTAL KNEE ARTHROPLASTY;  Surgeon: Melodi Lerner, MD;  Location: [facility] ORS;  Service: Orthopedics;  Laterality: Left;     Social History:   reports that he has quit smoking. His smoking use included cigarettes. He has never used smokeless tobacco. He reports that he does not drink alcohol and does not use drugs.   Family History:  His family history is not on file.   Allergies Allergies  Allergen Reactions   Dilaudid [Hydromorphone Hcl] Anaphylaxis and Other (See Comments)    Stops heart     Home Medications  Prior to Admission medications   Medication Sig Start Date End Date Taking? Authorizing Provider  aspirin  81 MG chewable tablet Chew 81 mg by mouth daily.   Yes [provider]  cyanocobalamin  (VITAMIN B12) 1000 MCG tablet Take 1,000 mcg by mouth daily.   Yes [provider]  daptomycin  (CUBICIN ) IVPB Inject 700 mg into the vein daily. Indication:  MRSE bacteremia First Dose: Yes Last Day of Therapy:  03/20/24 Labs - Once weekly:  CBC/D, BMP, and CPK Labs - Once weekly: ESR and CRP Method of administration: IV Push Method of administration may be changed at the discretion of home infusion pharmacist based upon assessment of the patient and/or caregiver's ability to self-administer the medication ordered. 02/10/24 03/20/24 Yes Sebastian Toribio GAILS, MD  glipiZIDE (GLUCOTROL XL) 2.5 MG 24 hr tablet Take 2.5 mg by mouth daily. 11/26/22  Yes [provider]  heparin  100 units/mL SOLN by Dialysis route daily. 1 dose of an unknown  amount  Yes [provider]  ibuprofen (ADVIL) 200 MG tablet Take 400 mg by mouth every 6 (six) hours as needed for mild pain (pain score 1-3).   Yes [provider]  indomethacin (INDOCIN) 25 MG capsule Take 25 mg by mouth 2 (two) times daily with a meal. 05/20/21  Yes [provider]  meclizine  (ANTIVERT ) 25 MG tablet Take 25 mg by mouth 3 (three) times daily as needed for dizziness.   Yes [provider]  metoprolol  succinate (TOPROL -XL) 25 MG 24 hr tablet Take 25 mg by mouth in the morning and at bedtime. 09/01/20  Yes [provider]  oxyCODONE -acetaminophen  (PERCOCET/ROXICET) 5-325 MG tablet Take 1 tablet by mouth every 6 (six) hours as needed for severe pain (pain score 7-10). 02/09/24  Yes McKenzie, Belvie CROME, MD  rosuvastatin  (CRESTOR ) 5 MG tablet Take 5 mg by mouth every Saturday.   Yes [provider]  tamsulosin  (FLOMAX ) 0.4 MG CAPS capsule Take 1 capsule (0.4 mg total) by mouth daily. 02/03/24  Yes McKenzie, Belvie CROME, MD  traMADol  (ULTRAM ) 50 MG tablet Take 50 mg by mouth 3 (three) times daily as needed for moderate pain (pain score 4-6). 01/23/24  Yes [provider]  Blood Glucose Monitoring Suppl DEVI 1 each by Does not apply route as directed. Dispense based on patient and insurance preference. Use up to four times daily as directed. (FOR ICD-10 E10.9, E11.9). 02/13/24   Sebastian Toribio GAILS, MD  colchicine  0.6 MG tablet Take 1 tablet (0.6 mg total) by mouth 2 (two) times daily for 10 days. Patient not taking: Reported on 02/18/2024 02/13/24 02/23/24  Sebastian Toribio GAILS, MD  Glucose Blood (BLOOD GLUCOSE TEST STRIPS) STRP 1 each by Does not apply route as directed. Dispense based on patient and insurance preference. Use up to four times daily as directed. (FOR ICD-10 E10.9, E11.9). 02/13/24   Sebastian Toribio GAILS, MD  Lancet Device MISC 1 each by Does not apply route as directed. Dispense based on patient and insurance preference.  Use up to four times daily as directed. (FOR ICD-10 E10.9, E11.9). 02/13/24   Sebastian Toribio GAILS, MD  Lancets MISC 1 each by Does not apply route as directed. Dispense based on patient and insurance preference. Use up to four times daily as directed. (FOR ICD-10 E10.9, E11.9). 02/13/24   Sebastian Toribio GAILS, MD  nitrofurantoin , macrocrystal-monohydrate, (MACROBID ) 100 MG capsule Take 1 capsule (100 mg total) by mouth every 12 (twelve) hours. Patient not taking: Reported on 02/05/2024 02/03/24   Sherrilee Belvie CROME, MD  [facility] ULTRA TEST test strip 1 each by Other route daily. 02/13/24   Sebastian Toribio GAILS, MD     Critical care time: 45 minutes    Tywanda Rice, DNP, AGACNP-BC [facility]  Please see [email or web address] for pager details.  From 7A-7P if no response, please call [phone]. After hours, please call ELink [phone].

## 2024-02-18 NOTE — ED Provider Notes (Signed)
 [place] EMERGENCY DEPARTMENT AT [facility] Provider Note   CSN: [id number] Arrival date & time: 02/18/24  1801     Patient presents with: Fever   Marcus Simpson is a 78 y.o. male.    Fever  This patient is a very ill-appearing 78 year old male who presents to the hospital after having an admission to the hospital on November 16, almost 2 weeks ago.  The patient was admitted with what appeared to be a urinary tract obstruction and an acute kidney injury, he had bacteremia due to methicillin resistant staph he had a evidence of pyelonephritis, he had a kidney stone and ultimately required a stent to be placed.  His creatinine had gone up to 3.6, his lactate was 3.0 his urine culture showed staphylococcal infection, a left ureteral stent was placed on November 16 by urology, they recommended continuation of the Foley catheter for 3 to 5 days and then follow-up with outpatient urology.  He had a successful voiding trial, he was discharged on Zyvox  in the hospital and then placed on IV daptomycin  next through a PICC line, he has been taking this ever since according to the spouse.  The PICC line is in the right upper extremity.  The patient declined a transesophageal echocardiogram to look for vegetations, he is being treated for presumptive endocarditis repeat blood cultures were obtained on November 18 and there was no growth at the time of discharge on the 26th.  6 weeks of daptomycin  was recommended.  The patient presents from home today in the care of his wife, he has been sick for a couple of days feeling weak, last night at 3:00 in the morning she measured temperature as high as 102.2, he has been feeling febrile and chills all day today with progressive weakness and the inability to stand up without significant assistance.  She was able to barely get him in the car and he needs to be in a wheelchair on the way back to the room as he is too weak to walk.  Initial blood pressure  was 70/50 and heart rate of just over 110 bpm.    Prior to Admission medications   Medication Sig Start Date End Date Taking? Authorizing Provider  aspirin  81 MG chewable tablet Chew 81 mg by mouth daily.   Yes [provider]  cyanocobalamin  (VITAMIN B12) 1000 MCG tablet Take 1,000 mcg by mouth daily.   Yes [provider]  daptomycin  (CUBICIN ) IVPB Inject 700 mg into the vein daily. Indication:  MRSE bacteremia First Dose: Yes Last Day of Therapy:  03/20/24 Labs - Once weekly:  CBC/D, BMP, and CPK Labs - Once weekly: ESR and CRP Method of administration: IV Push Method of administration may be changed at the discretion of home infusion pharmacist based upon assessment of the patient and/or caregiver's ability to self-administer the medication ordered. 02/10/24 03/20/24 Yes Sebastian Toribio GAILS, MD  glipiZIDE (GLUCOTROL XL) 2.5 MG 24 hr tablet Take 2.5 mg by mouth daily. 11/26/22  Yes [provider]  heparin  100 units/mL SOLN by Dialysis route daily. 1 dose of an unknown amount   Yes [provider]  ibuprofen (ADVIL) 200 MG tablet Take 400 mg by mouth every 6 (six) hours as needed for mild pain (pain score 1-3).   Yes [provider]  indomethacin (INDOCIN) 25 MG capsule Take 25 mg by mouth 2 (two) times daily with a meal. 05/20/21  Yes [provider]  meclizine  (ANTIVERT ) 25 MG tablet  Take 25 mg by mouth 3 (three) times daily as needed for dizziness.   Yes [provider]  metoprolol  succinate (TOPROL -XL) 25 MG 24 hr tablet Take 25 mg by mouth in the morning and at bedtime. 09/01/20  Yes [provider]  oxyCODONE -acetaminophen  (PERCOCET/ROXICET) 5-325 MG tablet Take 1 tablet by mouth every 6 (six) hours as needed for severe pain (pain score 7-10). 02/09/24  Yes McKenzie, Belvie CROME, MD  rosuvastatin  (CRESTOR ) 5 MG tablet Take 5 mg by mouth every Saturday.   Yes [provider]  tamsulosin  (FLOMAX ) 0.4 MG CAPS capsule  Take 1 capsule (0.4 mg total) by mouth daily. 02/03/24  Yes McKenzie, Belvie CROME, MD  traMADol  (ULTRAM ) 50 MG tablet Take 50 mg by mouth 3 (three) times daily as needed for moderate pain (pain score 4-6). 01/23/24  Yes [provider]  Blood Glucose Monitoring Suppl DEVI 1 each by Does not apply route as directed. Dispense based on patient and insurance preference. Use up to four times daily as directed. (FOR ICD-10 E10.9, E11.9). 02/13/24   Sebastian Toribio GAILS, MD  colchicine  0.6 MG tablet Take 1 tablet (0.6 mg total) by mouth 2 (two) times daily for 10 days. Patient not taking: Reported on 02/18/2024 02/13/24 02/23/24  Sebastian Toribio GAILS, MD  Glucose Blood (BLOOD GLUCOSE TEST STRIPS) STRP 1 each by Does not apply route as directed. Dispense based on patient and insurance preference. Use up to four times daily as directed. (FOR ICD-10 E10.9, E11.9). 02/13/24   Sebastian Toribio GAILS, MD  Lancet Device MISC 1 each by Does not apply route as directed. Dispense based on patient and insurance preference. Use up to four times daily as directed. (FOR ICD-10 E10.9, E11.9). 02/13/24   Sebastian Toribio GAILS, MD  Lancets MISC 1 each by Does not apply route as directed. Dispense based on patient and insurance preference. Use up to four times daily as directed. (FOR ICD-10 E10.9, E11.9). 02/13/24   Sebastian Toribio GAILS, MD  nitrofurantoin , macrocrystal-monohydrate, (MACROBID ) 100 MG capsule Take 1 capsule (100 mg total) by mouth every 12 (twelve) hours. Patient not taking: Reported on 02/05/2024 02/03/24   Sherrilee Belvie CROME, MD  [facility] ULTRA TEST test strip 1 each by Other route daily. 02/13/24   Sebastian Toribio GAILS, MD    Allergies: Dilaudid [hydromorphone hcl]    Review of Systems  Constitutional:  Positive for fever.  All other systems reviewed and are negative.   Updated Vital Signs BP (!) 100/53   Pulse 94   Temp 98.4 F (36.9 C) (Oral)   Resp (!) 26   Ht 1.803 m (5' 11)   Wt 106.6 kg   SpO2  99%   BMI 32.79 kg/m   Physical Exam Vitals and nursing note reviewed.  Constitutional:      General: He is in acute distress.     Appearance: He is well-developed. He is ill-appearing.  HENT:     Head: Normocephalic and atraumatic.     Mouth/Throat:     Pharynx: No oropharyngeal exudate.  Eyes:     General: No scleral icterus.       Right eye: No discharge.        Left eye: No discharge.     Conjunctiva/sclera: Conjunctivae normal.     Pupils: Pupils are equal, round, and reactive to light.  Neck:     Thyroid : No thyromegaly.     Vascular: No JVD.  Cardiovascular:     Rate and Rhythm: Regular rhythm.  Tachycardia present.     Heart sounds: Normal heart sounds. No murmur heard.    No friction rub. No gallop.  Pulmonary:     Effort: Pulmonary effort is normal. No respiratory distress.     Breath sounds: Normal breath sounds. No wheezing or rales.  Abdominal:     General: Bowel sounds are normal. There is no distension.     Palpations: Abdomen is soft. There is no mass.     Tenderness: There is no abdominal tenderness.  Musculoskeletal:        General: No tenderness. Normal range of motion.     Cervical back: Normal range of motion and neck supple.     Right lower leg: No edema.     Left lower leg: No edema.  Lymphadenopathy:     Cervical: No cervical adenopathy.  Skin:    General: Skin is warm and dry.     Findings: No erythema or rash.  Neurological:     Mental Status: He is alert.     Coordination: Coordination normal.  Psychiatric:        Behavior: Behavior normal.     (all labs ordered are listed, but only abnormal results are displayed) Labs Reviewed  LACTIC ACID, PLASMA - Abnormal; Notable for the following components:      Result Value   Lactic Acid, Venous 5.6 (*)    All other components within normal limits  COMPREHENSIVE METABOLIC PANEL WITH GFR - Abnormal; Notable for the following components:   CO2 15 (*)    Glucose, Bld 122 (*)    BUN 33 (*)     Creatinine, Ser 2.38 (*)    Calcium  7.3 (*)    Total Protein 5.9 (*)    Albumin 3.0 (*)    Total Bilirubin 1.3 (*)    GFR, Estimated 27 (*)    Anion gap 18 (*)    All other components within normal limits  CBC WITH DIFFERENTIAL/PLATELET - Abnormal; Notable for the following components:   WBC 63.9 (*)    RBC 3.92 (*)    Hemoglobin 11.9 (*)    HCT 36.5 (*)    Platelets 422 (*)    Neutro Abs 51.5 (*)    Monocytes Absolute 3.9 (*)    Basophils Absolute 0.2 (*)    Abs Immature Granulocytes 6.58 (*)    All other components within normal limits  PROTIME-INR - Abnormal; Notable for the following components:   Prothrombin Time 16.4 (*)    INR 1.3 (*)    All other components within normal limits  RESP PANEL BY RT-PCR (RSV, FLU A&B, COVID)  [id number]  CULTURE, BLOOD (ROUTINE X 2)  CULTURE, BLOOD (ROUTINE X 2)  LACTIC ACID, PLASMA  URINALYSIS, W/ REFLEX TO CULTURE (INFECTION SUSPECTED)    EKG: EKG Interpretation Date/Time:  Saturday February 18 2024 18:26:59 EST Ventricular Rate:  107 PR Interval:  141 QRS Duration:  81 QT Interval:  324 QTC Calculation: 433 R Axis:   21  Text Interpretation: Sinus tachycardia Abnormal R-wave progression, early transition Baseline wander in lead(s) II III aVF Confirmed by Cleotilde Rogue ([id number]) on 02/18/2024 6:34:14 PM  Radiology: CT ABDOMEN PELVIS WO CONTRAST Result Date: 02/18/2024 CLINICAL DATA:  Left upper quadrant pain. EXAM: CT ABDOMEN AND PELVIS WITHOUT CONTRAST TECHNIQUE: Multidetector CT imaging of the abdomen and pelvis was performed following the standard protocol without IV contrast. RADIATION DOSE REDUCTION: This exam was performed according to the departmental dose-optimization program which includes automated exposure control, adjustment  of the mA and/or kV according to patient size and/or use of iterative reconstruction technique. COMPARISON:  CT abdomen and pelvis 02/05/2024. FINDINGS: Lower chest: No acute abnormality. Hepatobiliary: No  focal liver abnormality is seen. No gallstones, gallbladder wall thickening, or biliary dilatation. Pancreas: Unremarkable. No pancreatic ductal dilatation or surrounding inflammatory changes. Spleen: Normal in size without focal abnormality. Adrenals/Urinary Tract: There is a new left ureteral stent in place in proper position. There is no left-sided hydronephrosis. Stable position of calculus in the left mid ureter measuring 5 mm. Bilateral renal calculi are otherwise stable. Bilateral renal cysts have not significantly changed. Adrenal glands are within normal limits. There is diffuse bladder wall thickening versus normal under distension. Stomach/Bowel: Stomach is within normal limits. No evidence of bowel wall thickening, distention, or inflammatory changes. The appendix is not visualized. Vascular/Lymphatic: Aortic atherosclerosis. No enlarged abdominal or pelvic lymph nodes. There are few nonenlarged left retroperitoneal lymph nodes, unchanged. Reproductive: Prostate radiotherapy seeds are present. Other: There are small fat containing inguinal hernias. There is no ascites. Musculoskeletal: The bones are osteopenic. Chronic compression deformities of T12 and L1 appear unchanged. IMPRESSION: 1. New left ureteral stent in place. No hydronephrosis. 2. Stable position of 5 mm calculus in the left mid ureter. 3. Stable bilateral renal calculi. 4. Diffuse bladder wall thickening versus normal under distension. Correlate clinically for cystitis. 5. Aortic atherosclerosis. Aortic Atherosclerosis (ICD10-I70.0). Electronically Signed   By: Greig Pique M.D.   On: 02/18/2024 19:54   DG Chest Port 1 View Result Date: 02/18/2024 CLINICAL DATA:  Sepsis EXAM: PORTABLE CHEST 1 VIEW COMPARISON:  Chest x-ray 02/05/2024 FINDINGS: Right upper extremity PICC terminates in the SVC. The heart size and mediastinal contours are within normal limits. Both lungs are clear. The visualized skeletal structures are unremarkable.  IMPRESSION: No active disease. Electronically Signed   By: Greig Pique M.D.   On: 02/18/2024 18:42     .Critical Care  Performed by: Cleotilde Rogue, MD Authorized by: Cleotilde Rogue, MD   Critical care provider statement:    Critical care time (minutes):  75   Critical care time was exclusive of:  Separately billable procedures and treating other patients and teaching time   Critical care was necessary to treat or prevent imminent or life-threatening deterioration of the following conditions:  Sepsis and shock   Critical care was time spent personally by me on the following activities:  Development of treatment plan with patient or surrogate, discussions with consultants, evaluation of patient's response to treatment, examination of patient, obtaining history from patient or surrogate, review of old charts, re-evaluation of patient's condition, pulse oximetry, ordering and review of radiographic studies, ordering and review of laboratory studies and ordering and performing treatments and interventions   I assumed direction of critical care for this patient from another provider in my specialty: no     Care discussed with: admitting provider   Comments:          Medications Ordered in the ED  lactated ringers  infusion (1,000 mLs Intravenous New Bag/Given 02/18/24 1907)  lactated ringers  bolus 1,000 mL (0 mLs Intravenous Stopped 02/18/24 1955)    And  lactated ringers  bolus 1,000 mL (0 mLs Intravenous Stopped 02/18/24 1955)    And  lactated ringers  bolus 1,000 mL (1,000 mLs Intravenous New Bag/Given 02/18/24 1956)    And  lactated ringers  bolus 500 mL (has no administration in time range)  norepinephrine (LEVOPHED) 4mg  in (0.016 mg/mL) premix infusion (has no administration in time range)  vancomycin  (VANCOCIN ) IVPB 1000 mg/200 mL premix (0 mg Intravenous Stopped 02/18/24 1956)  piperacillin-tazobactam (ZOSYN) IVPB 3.375 g (0 g Intravenous Stopped 02/18/24 1907)                                     Medical Decision Making Amount and/or Complexity of Data Reviewed Labs: ordered. Radiology: ordered.  Risk Prescription drug management. Decision regarding hospitalization.    This patient presents to the ED for concern of septic shock, this involves an extensive number of treatment options, and is a complaint that carries with it a high risk of complications and morbidity.  The differential diagnosis includes sepsis from PICC line, sepsis from known bacteremia, urinary tract infection, ureteral stent in the ureter   Co morbidities / Chronic conditions that complicate the patient evaluation  Known ureteral stent   Additional history obtained:  Additional history obtained from EMR External records from outside source obtained and reviewed including medical record including recent admission   Lab Tests:  I Ordered, and personally interpreted labs.  The pertinent results include: Severe leukocytosis measuring about 63,000, acute renal failure, lactate of over 5.5   Imaging Studies ordered:  I ordered imaging studies including CT scan of the abdomen and pelvis I independently visualized and interpreted imaging which showed ureteral stent appears to be in place I agree with the radiologist interpretation   Cardiac Monitoring: / EKG:  The patient was maintained on a cardiac monitor.  I personally viewed and interpreted the cardiac monitored which showed an underlying rhythm of: Sinus tachycardia   Problem List / ED Course / Critical interventions / Medication management  Patient has septic shock I ordered medication including vancomycin  and Zosyn, 30 cc/kg IV fluid bolus Reevaluation of the patient after these medicines showed that the patient some improvement I have reviewed the patients home medicines and have made adjustments as needed   Consultations Obtained:  I requested consultation with the discussed with Dr. Carolee with urology who recommends  that the patient be transferred to the ICU at [facility], they will be part of the conversation and recommend medical stabilizing care with antibiotics tonight, I discussed the care with the critical intensivist Dr. Dub who has excepted the patient's transfer of care to [facility], ICU,  and discussed lab and imaging findings as well as pertinent plan - they recommend: Transfer to ICU   Social Determinants of Health:  Patient is critically ill with septic shock   Test / Admission - Considered:  Needs ICU stay, antibiotics given, IV fluids at 30 cc/kg, Levophed may be needed, frequent reevaluations, updated family, discussions with multiple consultants, holding orders placed at 7:55 PM      Final diagnoses:  Septic shock (HCC)  Acute renal failure, unspecified acute renal failure type    ED Discharge Orders     None          Cleotilde Rogue, MD 02/18/24 [phone]

## 2024-02-18 NOTE — Consult Note (Signed)
 Reviewed history and imaging. Septic following ureteral stent placement for obstructing stone and sepsis  CT with stent in good position and no hydronephrosis.  Recommended foley for max decompression of system but otherwise no acute urological intervention needed

## 2024-02-18 NOTE — Progress Notes (Signed)
 eLink Physician-Brief Progress Note Patient Name: Marcus Simpson DOB: [birth year] MRN: [id number]   Date of Service  02/18/2024  HPI/Events of Note  eICU Brief new admit note: 78 yr old male with recent ureteral sten placement for MRSA bacteremia sepsis from obstructive stone. Now re admitted to ICU for Septic shock on levophed.   Camera: On room air, ground team is in evaluating. VS seems stable.  Going on to levophed, MAP > 70 now.  On LR 150 ml/hr.  Discussed with RN.  Data: UA postive nitrite, wbc 11-20  AG metabolic acidosis. Cr 2.38, worsened TB 1.3 LA up to 7.2 from 5.6 Leukocytosis to 63.9 from 10.4 from 02/13/2024. Anemia stable at 11.9, plt 422. No bands or toxic granulation INR 1.3 Covid/flu neg  CT abd: 1. New left ureteral stent in place. No hydronephrosis. 2. Stable position of 5 mm calculus in the left mid ureter. 3. Stable bilateral renal calculi. 4. Diffuse bladder wall thickening versus normal under distension. Correlate clinically for cystitis. 5. Aortic atherosclerosis.  Cxr image reviewed. Has a PICC line Right side. Increased bilateral broncho vascular prominence. No obvious pneumonia or effusion.   EF 55%.  EKG: sinus tachycardia, qtc 433, no acute ST T changes. Left axis.   eICU Interventions    - as per Urology notes: Septic following ureteral stent placement for obstructing stone and sepsis  CT with stent in good position and no hydronephrosis.  Recommended foley for max decompression of system but otherwise no acute urological intervention needed - continue current care plan  -on levophed, Vanc/zosyn pending blood cultures.  - trend LA, wbc. Urine out put. - s/p 3.6 lit fluid bolus.   Jodelle Hutching, MD [phone]      Intervention Category Major Interventions: Sepsis - evaluation and management;Hypotension - evaluation and management Evaluation Type: New Patient Evaluation  Jodelle ONEIDA Hutching 02/18/2024, 10:06 PM

## 2024-02-18 NOTE — Sepsis Progress Note (Signed)
 Sepsis protocol is being followed by eLink.

## 2024-02-18 NOTE — Progress Notes (Signed)
 Pharmacy Antibiotic Note  Marcus Simpson is a 78 y.o. male admitted on 02/18/2024 with sepsis s/p uretal stent placement. Pharmacy has been consulted for vancomycin  and Zosyn dosing. Vancomycin  1000mg  and Zosyn 3.375g given as one-time ED doses ~1830. Pt on daptomycin  PTA for recent MRSE bacteremia.  Plan: Vancomycin  1500mg  IV x1 to complete load (2500mg  total) then 1500mg  IV q48h - est AUC 529 Zosyn 3.375g IV EI q8h Follow Cr, LOT Cx Vancomycin  levels PRN  Height: 5' 11 (180.3 cm) Weight: 107.8 kg (237 lb 10.5 oz) IBW/kg (Calculated) : 75.3  Temp (24hrs), Avg:98.3 F (36.8 C), Min:98.3 F (36.8 C), Max:98.4 F (36.9 C)  Recent Labs  Lab 02/12/24 0312 02/13/24 0244 02/18/24 1825 02/18/24 2041  WBC 17.0* 10.4 63.9*  --   CREATININE 1.69* 1.80* 2.38*  --   LATICACIDVEN  --   --  5.6* 7.2*    Estimated Creatinine Clearance: 30.9 mL/min (A) (by C-G formula based on SCr of 2.38 mg/dL (H)).    Allergies  Allergen Reactions   Dilaudid [Hydromorphone Hcl] Anaphylaxis and Other (See Comments)    Stops heart    Antimicrobials this admission: Zosyn 11/29 >>  Vancomycin  11/29 >>   Dose adjustments this admission: none  Microbiology results: Pending  Ozell Jamaica, PharmD, BCPS, [facility] Clinical Pharmacist [phone] Please check AMION for all [facility] Pharmacy numbers 02/18/2024

## 2024-02-18 NOTE — ED Triage Notes (Signed)
 Pt comes in for a fever of 102.4 this morning around 0300. Last dose of of motrin was 1630 today. Pt did not come in until not because the fever came back around 1630 this morning.   Pt had a urinary stent placed for kidney stones and supposed to come out this coming week. A&Ox4.

## 2024-02-18 NOTE — ED Notes (Signed)
 Patient transported to CT

## 2024-02-18 NOTE — Consult Note (Signed)
 CODE SEPSIS - PHARMACY COMMUNICATION  **Broad Spectrum Antibiotics should be administered within 1 hour of Sepsis diagnosis**  Time Code Sepsis Called/Page Received: 1825  Antibiotics Ordered: vancomycin  and zosyn   Time of 1st antibiotic administration: 1839  Additional action taken by pharmacy: n/a  If necessary, Name of Provider/Nurse Contacted: none    Annabella LOISE Banks ,PharmD Clinical Pharmacist  02/18/2024  6:28 PM

## 2024-02-19 ENCOUNTER — Inpatient Hospital Stay (HOSPITAL_COMMUNITY)

## 2024-02-19 ENCOUNTER — Other Ambulatory Visit: Payer: Self-pay

## 2024-02-19 ENCOUNTER — Encounter (HOSPITAL_COMMUNITY): Payer: Self-pay | Admitting: Pulmonary Disease

## 2024-02-19 DIAGNOSIS — M25461 Effusion, right knee: Secondary | ICD-10-CM

## 2024-02-19 DIAGNOSIS — R6521 Severe sepsis with septic shock: Secondary | ICD-10-CM | POA: Diagnosis not present

## 2024-02-19 DIAGNOSIS — R7881 Bacteremia: Secondary | ICD-10-CM

## 2024-02-19 DIAGNOSIS — R112 Nausea with vomiting, unspecified: Secondary | ICD-10-CM

## 2024-02-19 DIAGNOSIS — B954 Other streptococcus as the cause of diseases classified elsewhere: Secondary | ICD-10-CM

## 2024-02-19 LAB — BLOOD CULTURE ID PANEL (REFLEXED) - BCID2

## 2024-02-19 LAB — BASIC METABOLIC PANEL WITH GFR
Anion gap: 13 (ref 5–15)
BUN: 38 mg/dL — ABNORMAL HIGH (ref 8–23)
CO2: 21 mmol/L — ABNORMAL LOW (ref 22–32)
Calcium: 8.4 mg/dL — ABNORMAL LOW (ref 8.9–10.3)
Chloride: 98 mmol/L (ref 98–111)
Creatinine, Ser: 2.77 mg/dL — ABNORMAL HIGH (ref 0.61–1.24)
GFR, Estimated: 22 mL/min — ABNORMAL LOW (ref >60)
Glucose, Bld: 127 mg/dL — ABNORMAL HIGH (ref 70–99)
Potassium: 5 mmol/L (ref 3.5–5.1)
Sodium: 132 mmol/L — ABNORMAL LOW (ref 135–145)

## 2024-02-19 LAB — CBC
HCT: 29.7 % — ABNORMAL LOW (ref 39.0–52.0)
Hemoglobin: 9.8 g/dL — ABNORMAL LOW (ref 13.0–17.0)
MCH: 30.5 pg (ref 26.0–34.0)
MCHC: 33 g/dL (ref 30.0–36.0)
MCV: 92.5 fL (ref 80.0–100.0)
Platelets: 350 K/uL (ref 150–400)
RBC: 3.21 MIL/uL — ABNORMAL LOW (ref 4.22–5.81)
RDW: 13.3 % (ref 11.5–15.5)
WBC: 49.8 K/uL — ABNORMAL HIGH (ref 4.0–10.5)
nRBC: 0 % (ref 0.0–0.2)

## 2024-02-19 LAB — LACTIC ACID, PLASMA: Lactic Acid, Venous: 1.7 mmol/L (ref 0.5–1.9)

## 2024-02-19 LAB — ECHOCARDIOGRAM LIMITED
Area-P 1/2: 2.34 cm2
S' Lateral: 2.4 cm

## 2024-02-19 LAB — MAGNESIUM
Magnesium: 1.2 mg/dL — ABNORMAL LOW (ref 1.7–2.4)
Magnesium: 2.3 mg/dL (ref 1.7–2.4)

## 2024-02-19 LAB — GLUCOSE, CAPILLARY
Glucose-Capillary: 127 mg/dL — ABNORMAL HIGH (ref 70–99)
Glucose-Capillary: 137 mg/dL — ABNORMAL HIGH (ref 70–99)
Glucose-Capillary: 95 mg/dL (ref 70–99)
Glucose-Capillary: 104 mg/dL — ABNORMAL HIGH (ref 70–99)
Glucose-Capillary: 107 mg/dL — ABNORMAL HIGH (ref 70–99)

## 2024-02-19 LAB — PHOSPHORUS
Phosphorus: 3.2 mg/dL (ref 2.5–4.6)
Phosphorus: 4.6 mg/dL (ref 2.5–4.6)

## 2024-02-19 MED ORDER — ASPIRIN 81 MG PO CHEW
81.0000 mg | CHEWABLE_TABLET | Freq: Every day | ORAL | Status: DC
  Administered 2024-02-20 – 2024-02-23 (×4): 81 mg via ORAL
  Filled 2024-02-19 (×4): qty 1

## 2024-02-19 MED ORDER — MAGNESIUM SULFATE 2 GM/50ML IV SOLN
2.0000 g | Freq: Once | INTRAVENOUS | Status: AC
  Administered 2024-02-19: 2 g via INTRAVENOUS
  Filled 2024-02-19: qty 50

## 2024-02-19 MED ORDER — MAGNESIUM SULFATE 4 GM/100ML IV SOLN
4.0000 g | Freq: Once | INTRAVENOUS | Status: AC
  Administered 2024-02-19: 4 g via INTRAVENOUS
  Filled 2024-02-19: qty 100

## 2024-02-19 MED ORDER — ORAL CARE MOUTH RINSE: 15.0000 mL | OROMUCOSAL | Status: DC | PRN

## 2024-02-19 NOTE — Plan of Care (Signed)
  Problem: Education: Goal: Knowledge of General Education information will improve Description: Including pain rating scale, medication(s)/side effects and non-pharmacologic comfort measures Outcome: Progressing   Problem: Health Behavior/Discharge Planning: Goal: Ability to manage health-related needs will improve Outcome: Progressing   Problem: Clinical Measurements: Goal: Ability to maintain clinical measurements within normal limits will improve Outcome: Progressing Goal: Will remain free from infection Outcome: Progressing Goal: Diagnostic test results will improve Outcome: Progressing Goal: Respiratory complications will improve Outcome: Progressing Goal: Cardiovascular complication will be avoided Outcome: Progressing   Problem: Nutrition: Goal: Adequate nutrition will be maintained Outcome: Progressing   Problem: Coping: Goal: Level of anxiety will decrease Outcome: Progressing   Problem: Elimination: Goal: Will not experience complications related to bowel motility Outcome: Progressing   Problem: Pain Managment: Goal: General experience of comfort will improve and/or be controlled Outcome: Progressing   Problem: Safety: Goal: Ability to remain free from injury will improve Outcome: Progressing   Problem: Skin Integrity: Goal: Risk for impaired skin integrity will decrease Outcome: Progressing   Problem: Education: Goal: Ability to describe self-care measures that may prevent or decrease complications (Diabetes Survival Skills Education) will improve Outcome: Progressing Goal: Individualized Educational Video(s) Outcome: Progressing   Problem: Coping: Goal: Ability to adjust to condition or change in health will improve Outcome: Progressing   Problem: Fluid Volume: Goal: Ability to maintain a balanced intake and output will improve Outcome: Progressing   Problem: Metabolic: Goal: Ability to maintain appropriate glucose levels will improve Outcome:  Progressing   Problem: Nutritional: Goal: Maintenance of adequate nutrition will improve Outcome: Progressing Goal: Progress toward achieving an optimal weight will improve Outcome: Progressing   Problem: Skin Integrity: Goal: Risk for impaired skin integrity will decrease Outcome: Progressing   Problem: Tissue Perfusion: Goal: Adequacy of tissue perfusion will improve Outcome: Progressing

## 2024-02-19 NOTE — Progress Notes (Signed)
 NAME:  Marcus Simpson, MRN:  [id number], DOB:  Aug 09, 1945, LOS: 1 ADMISSION DATE:  02/18/2024, CONSULTATION DATE:  02/18/24 REFERRING MD:  Cleotilde NOVAK, CHIEF COMPLAINT:  Sepsis   History of Present Illness:  Marcus Simpson is an 78 year old male with past medical history significant for HTN, DM2, hx nephrolithiasis, asymptomatic bradycardia, prostate cancer who presented to [facility] ED 11/29 septic; with c/o weakness, febrile to 102.2, hypotensive 70/50 with recent 2 week hospitalization for MRSE bacteremia in setting of obstructing stone s/p ureteral stent placement. In the ED he was given 3.5L LR with improvement in BP, cultures drawn, and started empirically on Vanc/Zosyn. Patient was recently admitted November 16-26 for sepsis and discharged on Zyvox  and then placed on 6-week course of daptomycin  via PICC line. PCCM consulted for ICU admission.  Pertinent Medical History:   Past Medical History:  Diagnosis Date   Arthritis    Bradycardia    asymptomatic    Diabetes mellitus without complication (HCC)    History of kidney stones    Hypertension    Prostate cancer (HCC)    Significant Hospital Events: Including procedures, antibiotic start and stop dates in addition to other pertinent events   Transfer from [facility]; septic shock; no pressors; responsive to fluids, started on Vanc/Zosyn  Interim History / Subjective:  Off NE  Feels better this morning vs last night   LA normalized Cr 2.77 WBC down to 50 Objective    Blood pressure 121/65, pulse 73, temperature 97.7 F (36.5 C), resp. rate 19, height 5' 11 (1.803 m), weight 108.3 kg, SpO2 96%.        Intake/Output Summary (Last 24 hours) at 02/19/2024 0955 Last data filed at 02/19/2024 0900 Gross per 24 hour  Intake 6865.97 ml  Output 550 ml  Net 6315.97 ml   Filed Weights   02/18/24 1813 02/18/24 2200 02/19/24 0500  Weight: 106.6 kg 107.8 kg 108.3 kg   Examination: General: pleasant older adult M NAD  HEENT: NCAT pink  tacky mm  Pulm:  even unlabored on RA CV: cap refill is brisk. 1+ rad pulses  GI: soft non distended, + bowel sounds  Extremities: RUE PICC  Neuro: AAOx4    Resolved Problem List:  Lactic acidosis Shock  Assessment and Plan:   Septic shock, improved shock  UTI- recent obstructing stone s/p stent  MRSE bacteremia  Presumed IE (refused TEE last admit) -was dc 11/24 from admission for sepsis MRSE bact + pyelo obstructing stone s/p stent w Rx 6wk dapto  -R knee effusion tapped, no growth on cx  -PICC dressing on arrival was outdated & not of greatest integrity -- ?c/f secondary infection  P -vanc zosyn -follow cx data  -uro following, no surgical intervention needed.  cont abx, foley. stent in good position  -off NE, dc order  -ID consult  -will dc picc removal w ID   AKI on CKD 3 S/p L ureteral stent 2/2 obstructing stone  Hx prostate ca  HypoNa  Hypoca  Hypomag, improved  P -keep foley -follow renal indices, lytes, uop -as above, stent in good position no add'l uro intervention req at this time   Hx HTN, HLD -holding home BP meds for now, just came off pressors -takes his statin on Saturdays -- if he is still in the hospital end of next week can re-order.   Anemia  -follow CBC   Gout R knee  -effusion tapped last admission, fortunately not c/w septic arthritis.  -shares he  is not taking Rx colchicine  at home, doesn't want to take. Knee looks ok, feels ok. With AKI am not going to press the issue today but rec following up with him about this    DM2 -SSI   Dispo: stable to txf out of ICU. Will ask [facility] to take over in AM  Labs:  CBC: Recent Labs  Lab 02/13/24 0244 02/18/24 1825 02/18/24 2310 02/19/24 0350  WBC 10.4 63.9*  --  49.8*  NEUTROABS 8.7* 51.5*  --   --   HGB 11.2* 11.9* 10.2* 9.8*  HCT 33.8* 36.5* 30.0* 29.7*  MCV 92.3 93.1  --  92.5  PLT 412* 422*  --  350    Basic Metabolic Panel: Recent Labs  Lab 02/13/24 0244 02/18/24 1825  02/18/24 2305 02/18/24 2310 02/19/24 0350  NA 134* 138 131* 132* 132*  K 4.0 3.7 4.6 4.9 5.0  CL 97* 106 97*  --  98  CO2 23 15* 21*  --  21*  GLUCOSE 207* 122* 119*  --  127*  BUN 37* 33* 37*  --  38*  CREATININE 1.80* 2.38* 2.83*  --  2.77*  CALCIUM  9.7 7.3* 7.9*  --  8.4*  MG  --   --  1.2*  --  2.3  PHOS  --   --  3.2  --  4.6   GFR: Estimated Creatinine Clearance: 26.6 mL/min (A) (by C-G formula based on SCr of 2.77 mg/dL (H)). Recent Labs  Lab 02/13/24 0244 02/18/24 1825 02/18/24 2041 02/18/24 2219 02/19/24 0350  WBC 10.4 63.9*  --   --  49.8*  LATICACIDVEN  --  5.6* 7.2* 5.0* 1.7    Liver Function Tests: Recent Labs  Lab 02/18/24 1825  AST 32  ALT 29  ALKPHOS 62  BILITOT 1.3*  PROT 5.9*  ALBUMIN 3.0*   No results for input(s): LIPASE, AMYLASE in the last 168 hours. No results for input(s): AMMONIA in the last 168 hours.  ABG    Component Value Date/Time   PHART 7.3 (L) 12/29/2022 0025   PCO2ART 31 (L) 12/29/2022 0025   PO2ART 101 12/29/2022 0025   HCO3 23.7 02/18/2024 2310   TCO2 25 02/18/2024 2310   ACIDBASEDEF 1.0 02/18/2024 2310   O2SAT 64 02/18/2024 2310     Coagulation Profile: Recent Labs  Lab 02/18/24 1825  INR 1.3*    Cardiac Enzymes: No results for input(s): CKTOTAL, CKMB, CKMBINDEX, TROPONINI in the last 168 hours.  HbA1C: Hgb A1c MFr Bld  Date/Time Value Ref Range Status  12/23/2023 01:30 PM 6.4 (H) 4.8 - 5.6 % Final    Comment:    (NOTE) Diagnosis of Diabetes The following HbA1c ranges recommended by the American Diabetes Association (ADA) may be used as an aid in the diagnosis of diabetes mellitus.  Hemoglobin             Suggested A1C NGSP%              Diagnosis  <5.7                   Non Diabetic  5.7-6.4                Pre-Diabetic  >6.4                   Diabetic  <7.0                   Glycemic control for  adults with diabetes.    12/29/2022 05:24 AM 6.8 (H) 4.8 - 5.6  % Final    Comment:    (NOTE) Pre diabetes:          5.7%-6.4%  Diabetes:              >6.4%  Glycemic control for   <7.0% adults with diabetes     CBG: Recent Labs  Lab 02/13/24 0712 02/13/24 1118 02/18/24 2354 02/19/24 0318 02/19/24 0759  GLUCAP 169* 193* 92 127* 137*     High MDM   Ronnald Gave MSN, AGACNP-BC [facility] Amion for pager  02/19/2024, 9:55 AM

## 2024-02-19 NOTE — Plan of Care (Signed)
  Problem: Clinical Measurements: Goal: Ability to maintain clinical measurements within normal limits will improve Outcome: Progressing Goal: Will remain free from infection Outcome: Progressing   Problem: Nutrition: Goal: Adequate nutrition will be maintained Outcome: Progressing   Problem: Skin Integrity: Goal: Risk for impaired skin integrity will decrease Outcome: Progressing

## 2024-02-19 NOTE — H&P (View-Only) (Signed)
 H&P Physician requesting consult: Redell Pinal  Chief Complaint: Sepsis  History of Present Illness: 78 year old male with recent admission a couple weeks ago with left pyelonephritis secondary to an obstructing left ureteral stone status post left ureteral stent placement on 11/16.  Patient was discharged on daptomycin  for MRSE bacteremia.  He presented to the emergency department with fever, hypotension, elevated lactate, white blood cell count of 63.  CT scan was performed that showed left ureteral stent was in good position with no hydronephrosis.  There were no right ureteral stones.  Patient was transferred from [facility] to [facility] for critical care management.  This morning, he states he is feeling a little bit better.  Remains on pressors but seems to be clinically improving.  Past Medical History:  Diagnosis Date   Arthritis    Bradycardia    asymptomatic    Diabetes mellitus without complication (HCC)    History of kidney stones    Hypertension    Prostate cancer [facility])    Past Surgical History:  Procedure Laterality Date   COLONOSCOPY     CYSTOSCOPY WITH STENT PLACEMENT Right 12/28/2022   Procedure: CYSTOSCOPY WITH STENT PLACEMENT;  Surgeon: Carolee Sherwood [name], MD;  Location: [facility] OR;  Service: Urology;  Laterality: Right;   CYSTOSCOPY WITH STENT PLACEMENT Left 02/05/2024   Procedure: CYSTOSCOPY, WITH STENT INSERTION. URETHRAL DILATION;  Surgeon: Shane Steffan BROCKS, MD;  Location: [facility] ORS;  Service: Urology;  Laterality: Left;   EXTRACORPOREAL SHOCK WAVE LITHOTRIPSY Right 01/25/2023   Procedure: EXTRACORPOREAL SHOCK WAVE LITHOTRIPSY (ESWL);  Surgeon: [name], MD;  Location: [facility] ORS;  Service: Urology;  Laterality: Right;   FLEXIBLE SIGMOIDOSCOPY N/A 04/02/2015   Procedure: FLEXIBLE SIGMOIDOSCOPY;  Surgeon: Claudis RAYMOND Rivet, MD;  Location: [facility] ENDO SUITE;  Service: Endoscopy;  Laterality: N/A;  1:45 - moved to 1/11 @ 8:30 - Ann to notify   KIDNEY STONE SURGERY      RADIOACTIVE SEED IMPLANT N/A 12/29/2023   Procedure: INSERTION, RADIATION SOURCE, PROSTATE;  Surgeon: Sherrilee Belvie CROME, MD;  Location: [facility] ORS;  Service: Urology;  Laterality: N/A;   SPACE OAR INSTILLATION N/A 12/29/2023   Procedure: INJECTION, HYDROGEL SPACER;  Surgeon: Sherrilee Belvie CROME, MD;  Location: [facility] ORS;  Service: Urology;  Laterality: N/A;   TOTAL KNEE ARTHROPLASTY Left 05/02/2017   Procedure: LEFT TOTAL KNEE ARTHROPLASTY;  Surgeon: Melodi Lerner, MD;  Location: [facility] ORS;  Service: Orthopedics;  Laterality: Left;    Home Medications:  Medications Prior to Admission  Medication Sig Dispense Refill Last Dose/Taking   aspirin  81 MG chewable tablet Chew 81 mg by mouth daily.   Past Week   cyanocobalamin  (VITAMIN B12) 1000 MCG tablet Take 1,000 mcg by mouth daily.   02/18/2024 Morning   daptomycin  (CUBICIN ) IVPB Inject 700 mg into the vein daily. Indication:  MRSE bacteremia First Dose: Yes Last Day of Therapy:  03/20/24 Labs - Once weekly:  CBC/D, BMP, and CPK Labs - Once weekly: ESR and CRP Method of administration: IV Push Method of administration may be changed at the discretion of home infusion pharmacist based upon assessment of the patient and/or caregiver's ability to self-administer the medication ordered. 39 Units 0 02/18/2024 Evening   glipiZIDE (GLUCOTROL XL) 2.5 MG 24 hr tablet Take 2.5 mg by mouth daily.   02/18/2024 Morning   heparin  100 units/mL SOLN by Dialysis route daily. 1 dose of an unknown amount   02/18/2024 at  4:30 PM   ibuprofen (ADVIL) 200 MG tablet Take  400 mg by mouth every 6 (six) hours as needed for mild pain (pain score 1-3).   02/18/2024 at  4:30 PM   [Paused] indomethacin (INDOCIN) 25 MG capsule Take 25 mg by mouth 2 (two) times daily with a meal.   02/18/2024 Morning   meclizine  (ANTIVERT ) 25 MG tablet Take 25 mg by mouth 3 (three) times daily as needed for dizziness.   Unknown   metoprolol  succinate (TOPROL -XL) 25 MG 24 hr tablet Take 25 mg by mouth in  the morning and at bedtime.   02/18/2024 Morning   oxyCODONE -acetaminophen  (PERCOCET/ROXICET) 5-325 MG tablet Take 1 tablet by mouth every 6 (six) hours as needed for severe pain (pain score 7-10). 15 tablet 0 Past Week   rosuvastatin  (CRESTOR ) 5 MG tablet Take 5 mg by mouth every Saturday.   02/18/2024 Morning   tamsulosin  (FLOMAX ) 0.4 MG CAPS capsule Take 1 capsule (0.4 mg total) by mouth daily. 30 capsule 11 02/15/2024   traMADol  (ULTRAM ) 50 MG tablet Take 50 mg by mouth 3 (three) times daily as needed for moderate pain (pain score 4-6).   Past Week   Blood Glucose Monitoring Suppl DEVI 1 each by Does not apply route as directed. Dispense based on patient and insurance preference. Use up to four times daily as directed. (FOR ICD-10 E10.9, E11.9). 1 each 0    colchicine  0.6 MG tablet Take 1 tablet (0.6 mg total) by mouth 2 (two) times daily for 10 days. (Patient not taking: Reported on 02/18/2024) 20 tablet 0 Not Taking   Glucose Blood (BLOOD GLUCOSE TEST STRIPS) STRP 1 each by Does not apply route as directed. Dispense based on patient and insurance preference. Use up to four times daily as directed. (FOR ICD-10 E10.9, E11.9). 100 strip 0    Lancet Device MISC 1 each by Does not apply route as directed. Dispense based on patient and insurance preference. Use up to four times daily as directed. (FOR ICD-10 E10.9, E11.9). 1 each 0    Lancets MISC 1 each by Does not apply route as directed. Dispense based on patient and insurance preference. Use up to four times daily as directed. (FOR ICD-10 E10.9, E11.9). 100 each 0    [Paused] nitrofurantoin , macrocrystal-monohydrate, (MACROBID ) 100 MG capsule Take 1 capsule (100 mg total) by mouth every 12 (twelve) hours. (Patient not taking: Reported on 02/05/2024) 14 capsule 0 Not Taking   ONETOUCH ULTRA TEST test strip 1 each by Other route daily. 100 each 0    Allergies:  Allergies  Allergen Reactions   Dilaudid [Hydromorphone Hcl] Anaphylaxis and Other (See  Comments)    Stops heart    No family history on file. Social History:  reports that he has quit smoking. His smoking use included cigarettes. He has never used smokeless tobacco. He reports that he does not drink alcohol and does not use drugs.  ROS: A complete review of systems was performed.  All systems are negative except for pertinent findings as noted. ROS   Physical Exam:  Vital signs in last 24 hours: Temp:  [97.3 F (36.3 C)-100.4 F (38 C)] 97.7 F (36.5 C) (11/30 0800) Pulse Rate:  [63-192] 75 (11/30 0800) Resp:  [17-32] 25 (11/30 0800) BP: (71-143)/(41-71) 126/71 (11/30 0800) SpO2:  [91 %-100 %] 96 % (11/30 0800) Weight:  [106.6 kg-108.3 kg] 108.3 kg (11/30 0500) General:  Alert and oriented, No acute distress HEENT: Normocephalic, atraumatic Neck: No JVD or lymphadenopathy Cardiovascular: Regular rate and rhythm Lungs: Regular rate and effort  Abdomen: Soft, nontender, nondistended, no abdominal masses Back: No CVA tenderness Extremities: No edema Neurologic: Grossly intact  Laboratory Data:  Results for orders placed or performed during the hospital encounter of 02/18/24 (from the past 24 hours)  Resp panel by RT-PCR (RSV, Flu A&B, Covid) Anterior Nasal Swab     Status: None   Collection Time: 02/18/24  6:25 PM   Specimen: Anterior Nasal Swab  Result Value Ref Range   SARS Coronavirus 2 by RT PCR NEGATIVE NEGATIVE   Influenza A by PCR NEGATIVE NEGATIVE   Influenza B by PCR NEGATIVE NEGATIVE   Resp Syncytial Virus by PCR NEGATIVE NEGATIVE  Lactic acid, plasma     Status: Abnormal   Collection Time: 02/18/24  6:25 PM  Result Value Ref Range   Lactic Acid, Venous 5.6 (HH) 0.5 - 1.9 mmol/L  Comprehensive metabolic panel     Status: Abnormal   Collection Time: 02/18/24  6:25 PM  Result Value Ref Range   Sodium 138 135 - 145 mmol/L   Potassium 3.7 3.5 - 5.1 mmol/L   Chloride 106 98 - 111 mmol/L   CO2 15 (L) 22 - 32 mmol/L   Glucose, Bld 122 (H) 70 - 99  mg/dL   BUN 33 (H) 8 - 23 mg/dL   Creatinine, Ser 7.61 (H) 0.61 - 1.24 mg/dL   Calcium  7.3 (L) 8.9 - 10.3 mg/dL   Total Protein 5.9 (L) 6.5 - 8.1 g/dL   Albumin 3.0 (L) 3.5 - 5.0 g/dL   AST 32 15 - 41 U/L   ALT 29 0 - 44 U/L   Alkaline Phosphatase 62 38 - 126 U/L   Total Bilirubin 1.3 (H) 0.0 - 1.2 mg/dL   GFR, Estimated 27 (L) >60 mL/min   Anion gap 18 (H) 5 - 15  CBC with Differential     Status: Abnormal   Collection Time: 02/18/24  6:25 PM  Result Value Ref Range   WBC 63.9 (HH) 4.0 - 10.5 K/uL   RBC 3.92 (L) 4.22 - 5.81 MIL/uL   Hemoglobin 11.9 (L) 13.0 - 17.0 g/dL   HCT 63.4 (L) 60.9 - 47.9 %   MCV 93.1 80.0 - 100.0 fL   MCH 30.4 26.0 - 34.0 pg   MCHC 32.6 30.0 - 36.0 g/dL   RDW 86.6 88.4 - 84.4 %   Platelets 422 (H) 150 - 400 K/uL   nRBC 0.0 0.0 - 0.2 %   Neutrophils Relative % 81 %   Neutro Abs 51.5 (H) 1.7 - 7.7 K/uL   Lymphocytes Relative 3 %   Lymphs Abs 1.8 0.7 - 4.0 K/uL   Monocytes Relative 6 %   Monocytes Absolute 3.9 (H) 0.1 - 1.0 K/uL   Eosinophils Relative 0 %   Eosinophils Absolute 0.0 0.0 - 0.5 K/uL   Basophils Relative 0 %   Basophils Absolute 0.2 (H) 0.0 - 0.1 K/uL   WBC Morphology MORPHOLOGY UNREMARKABLE    RBC Morphology MORPHOLOGY UNREMARKABLE    Immature Granulocytes 10 %   Abs Immature Granulocytes 6.58 (H) 0.00 - 0.07 K/uL  Protime-INR     Status: Abnormal   Collection Time: 02/18/24  6:25 PM  Result Value Ref Range   Prothrombin Time 16.4 (H) 11.4 - 15.2 seconds   INR 1.3 (H) 0.8 - 1.2  Blood Culture (routine x 2)     Status: None (Preliminary result)   Collection Time: 02/18/24  6:25 PM   Specimen: Vein; Blood  Result Value Ref Range  Specimen Description BLOOD LEFT ANTECUBITAL    Special Requests      BOTTLES DRAWN AEROBIC AND ANAEROBIC Blood Culture adequate volume Performed at [facility], [address] [place], [place] [address]    Culture PENDING    Report Status PENDING   Blood Culture (routine x 2)     Status: None  (Preliminary result)   Collection Time: 02/18/24  6:30 PM   Specimen: Vein; Blood  Result Value Ref Range   Specimen Description BLOOD BLOOD RIGHT ARM    Special Requests      BOTTLES DRAWN AEROBIC AND ANAEROBIC Blood Culture adequate volume Performed at [facility], [address], [place], [place] [address]    Culture PENDING    Report Status PENDING   Lactic acid, plasma     Status: Abnormal   Collection Time: 02/18/24  8:41 PM  Result Value Ref Range   Lactic Acid, Venous 7.2 (HH) 0.5 - 1.9 mmol/L  MRSA Next Gen by PCR, Nasal     Status: None   Collection Time: 02/18/24  9:57 PM   Specimen: Nasal Mucosa; Nasal Swab  Result Value Ref Range   MRSA by PCR Next Gen NOT DETECTED NOT DETECTED  Lactic acid, plasma     Status: Abnormal   Collection Time: 02/18/24 10:19 PM  Result Value Ref Range   Lactic Acid, Venous 5.0 (HH) 0.5 - 1.9 mmol/L  Urinalysis, w/ Reflex to Culture (Infection Suspected) -Urine, Catheterized     Status: Abnormal   Collection Time: 02/18/24 10:45 PM  Result Value Ref Range   Specimen Source URINE, CATHETERIZED    Color, Urine YELLOW YELLOW   APPearance CLOUDY (A) CLEAR   Specific Gravity, Urine 1.020 1.005 - 1.030   pH 6.0 5.0 - 8.0   Glucose, UA NEGATIVE NEGATIVE mg/dL   Hgb urine dipstick LARGE (A) NEGATIVE   Bilirubin Urine NEGATIVE NEGATIVE   Ketones, ur NEGATIVE NEGATIVE mg/dL   Protein, ur >699 (A) NEGATIVE mg/dL   Nitrite POSITIVE (A) NEGATIVE   Leukocytes,Ua MODERATE (A) NEGATIVE   Squamous Epithelial / HPF 0-5 0 - 5 /HPF   WBC, UA >50 0 - 5 WBC/hpf   RBC / HPF 21-50 0 - 5 RBC/hpf   Bacteria, UA MANY (A) NONE SEEN   WBC Clumps PRESENT    Mucus PRESENT   Brain natriuretic peptide     Status: Abnormal   Collection Time: 02/18/24 11:05 PM  Result Value Ref Range   B Natriuretic Peptide 312.3 (H) 0.0 - 100.0 pg/mL  Basic metabolic panel     Status: Abnormal   Collection Time: 02/18/24 11:05 PM  Result Value Ref Range   Sodium 131 (L) 135 -  145 mmol/L   Potassium 4.6 3.5 - 5.1 mmol/L   Chloride 97 (L) 98 - 111 mmol/L   CO2 21 (L) 22 - 32 mmol/L   Glucose, Bld 119 (H) 70 - 99 mg/dL   BUN 37 (H) 8 - 23 mg/dL   Creatinine, Ser 7.16 (H) 0.61 - 1.24 mg/dL   Calcium  7.9 (L) 8.9 - 10.3 mg/dL   GFR, Estimated 22 (L) >60 mL/min   Anion gap 13 5 - 15  Magnesium      Status: Abnormal   Collection Time: 02/18/24 11:05 PM  Result Value Ref Range   Magnesium  1.2 (L) 1.7 - 2.4 mg/dL  Phosphorus     Status: None   Collection Time: 02/18/24 11:05 PM  Result Value Ref Range   Phosphorus 3.2 2.5 -  4.6 mg/dL  POCT I-Stat EG7     Status: Abnormal   Collection Time: 02/18/24 11:10 PM  Result Value Ref Range   pH, Ven 7.386 7.25 - 7.43   pCO2, Ven 40.0 (L) 44 - 60 mmHg   pO2, Ven 36 32 - 45 mmHg   Bicarbonate 23.7 20.0 - 28.0 mmol/L   TCO2 25 22 - 32 mmol/L   O2 Saturation 64 %   Acid-base deficit 1.0 0.0 - 2.0 mmol/L   Sodium 132 (L) 135 - 145 mmol/L   Potassium 4.9 3.5 - 5.1 mmol/L   Calcium , Ion 1.09 (L) 1.15 - 1.40 mmol/L   HCT 30.0 (L) 39.0 - 52.0 %   Hemoglobin 10.2 (L) 13.0 - 17.0 g/dL   Patient temperature 61.8 C    Sample type VENOUS    Comment VALUES VERIFIED BY LAB OR ISTAT   Glucose, capillary     Status: None   Collection Time: 02/18/24 11:54 PM  Result Value Ref Range   Glucose-Capillary 92 70 - 99 mg/dL  Glucose, capillary     Status: Abnormal   Collection Time: 02/19/24  3:18 AM  Result Value Ref Range   Glucose-Capillary 127 (H) 70 - 99 mg/dL  Basic metabolic panel     Status: Abnormal   Collection Time: 02/19/24  3:50 AM  Result Value Ref Range   Sodium 132 (L) 135 - 145 mmol/L   Potassium 5.0 3.5 - 5.1 mmol/L   Chloride 98 98 - 111 mmol/L   CO2 21 (L) 22 - 32 mmol/L   Glucose, Bld 127 (H) 70 - 99 mg/dL   BUN 38 (H) 8 - 23 mg/dL   Creatinine, Ser 7.22 (H) 0.61 - 1.24 mg/dL   Calcium  8.4 (L) 8.9 - 10.3 mg/dL   GFR, Estimated 22 (L) >60 mL/min   Anion gap 13 5 - 15  CBC     Status: Abnormal   Collection  Time: 02/19/24  3:50 AM  Result Value Ref Range   WBC 49.8 (H) 4.0 - 10.5 K/uL   RBC 3.21 (L) 4.22 - 5.81 MIL/uL   Hemoglobin 9.8 (L) 13.0 - 17.0 g/dL   HCT 70.2 (L) 60.9 - 47.9 %   MCV 92.5 80.0 - 100.0 fL   MCH 30.5 26.0 - 34.0 pg   MCHC 33.0 30.0 - 36.0 g/dL   RDW 86.6 88.4 - 84.4 %   Platelets 350 150 - 400 K/uL   nRBC 0.0 0.0 - 0.2 %  Magnesium      Status: None   Collection Time: 02/19/24  3:50 AM  Result Value Ref Range   Magnesium  2.3 1.7 - 2.4 mg/dL  Phosphorus     Status: None   Collection Time: 02/19/24  3:50 AM  Result Value Ref Range   Phosphorus 4.6 2.5 - 4.6 mg/dL  Lactic acid, plasma     Status: None   Collection Time: 02/19/24  3:50 AM  Result Value Ref Range   Lactic Acid, Venous 1.7 0.5 - 1.9 mmol/L  Glucose, capillary     Status: Abnormal   Collection Time: 02/19/24  7:59 AM  Result Value Ref Range   Glucose-Capillary 137 (H) 70 - 99 mg/dL   Recent Results (from the past 240 hours)  Body fluid culture w Gram Stain     Status: None   Collection Time: 02/10/24  5:11 PM   Specimen: Synovium; Synovial Fluid  Result Value Ref Range Status   Specimen Description   Final  SYNOVIAL Performed at [facility], [address] [place], [place] [address]    Special Requests   Final     R KNEE Performed at [facility], [address] [place], [place] [address]    Gram Stain   Final    ABUNDANT WBC PRESENT, PREDOMINANTLY PMN NO ORGANISMS SEEN    Culture   Final    NO GROWTH 3 DAYS Performed at [facility], [address], [place], [place] [address]    Report Status 02/14/2024 FINAL  Final  Anaerobic culture w Gram Stain     Status: None   Collection Time: 02/10/24  5:11 PM   Specimen: Synovium; Synovial Fluid  Result Value Ref Range Status   Specimen Description   Final    SYNOVIAL Performed at [facility], [address], [place], [place] [address]    Special Requests   Final    R  KNEE Performed at [facility], [address] [place], [place] [address]    Gram Stain   Final    FEW WBC PRESENT, PREDOMINANTLY PMN NO ORGANISMS SEEN    Culture   Final    NO ANAEROBES ISOLATED Performed at [facility], [address] [place], [place] [address]    Report Status 02/15/2024 FINAL  Final  Resp panel by RT-PCR (RSV, Flu A&B, Covid) Anterior Nasal Swab     Status: None   Collection Time: 02/18/24  6:25 PM   Specimen: Anterior Nasal Swab  Result Value Ref Range Status   SARS Coronavirus 2 by RT PCR NEGATIVE NEGATIVE Final    Comment: (NOTE) SARS-CoV-2 target nucleic acids are NOT DETECTED.  The SARS-CoV-2 RNA is generally detectable in upper respiratory specimens during the acute phase of infection. The lowest concentration of SARS-CoV-2 viral copies this assay can detect is 138 copies/mL. A negative result does not preclude SARS-Cov-2 infection and should not be used as the sole basis for treatment or other patient management decisions. A negative result may occur with  improper specimen collection/handling, submission of specimen other than nasopharyngeal swab, presence of viral mutation(s) within the areas targeted by this assay, and inadequate number of viral copies(<138 copies/mL). A negative result must be combined with clinical observations, patient history, and epidemiological information. The expected result is Negative.  Fact Sheet for Patients:  bloggercourse.com  Fact Sheet for Healthcare Providers:  seriousbroker.it  This test is no t yet approved or cleared by the [place]  FDA and  has been authorized for detection and/or diagnosis of SARS-CoV-2 by FDA under an Emergency Use Authorization (EUA). This EUA will remain  in effect (meaning this test can be used) for the duration of the COVID-19 declaration under Section 564(b)(1) of the Act, 21 U.S.C.section  360bbb-3(b)(1), unless the authorization is terminated  or revoked sooner.       Influenza A by PCR NEGATIVE NEGATIVE Final   Influenza B by PCR NEGATIVE NEGATIVE Final    Comment: (NOTE) The Xpert Xpress SARS-CoV-2/FLU/RSV plus assay is intended as an aid in the diagnosis of influenza from Nasopharyngeal swab specimens and should not be used as a sole basis for treatment. Nasal washings and aspirates are unacceptable for Xpert Xpress SARS-CoV-2/FLU/RSV testing.  Fact Sheet for Patients: bloggercourse.com  Fact Sheet for Healthcare Providers: seriousbroker.it  This test is not yet approved or cleared by the [place]  FDA and has been authorized for detection and/or diagnosis of SARS-CoV-2 by FDA under  an Emergency Use Authorization (EUA). This EUA will remain in effect (meaning this test can be used) for the duration of the COVID-19 declaration under Section 564(b)(1) of the Act, 21 U.S.C. section 360bbb-3(b)(1), unless the authorization is terminated or revoked.     Resp Syncytial Virus by PCR NEGATIVE NEGATIVE Final    Comment: (NOTE) Fact Sheet for Patients: bloggercourse.com  Fact Sheet for Healthcare Providers: seriousbroker.it  This test is not yet approved or cleared by the [place]  FDA and has been authorized for detection and/or diagnosis of SARS-CoV-2 by FDA under an Emergency Use Authorization (EUA). This EUA will remain in effect (meaning this test can be used) for the duration of the COVID-19 declaration under Section 564(b)(1) of the Act, 21 U.S.C. section 360bbb-3(b)(1), unless the authorization is terminated or revoked.  Performed at [facility], [address], [place], [place] [address]   Blood Culture (routine x 2)     Status: None (Preliminary result)   Collection Time: 02/18/24  6:25 PM   Specimen: Vein; Blood  Result Value Ref Range Status    Specimen Description BLOOD LEFT ANTECUBITAL  Final   Special Requests   Final    BOTTLES DRAWN AEROBIC AND ANAEROBIC Blood Culture adequate volume Performed at [facility], [address] [place], [place] [address]    Culture PENDING  Incomplete   Report Status PENDING  Incomplete  Blood Culture (routine x 2)     Status: None (Preliminary result)   Collection Time: 02/18/24  6:30 PM   Specimen: Vein; Blood  Result Value Ref Range Status   Specimen Description BLOOD BLOOD RIGHT ARM  Final   Special Requests   Final    BOTTLES DRAWN AEROBIC AND ANAEROBIC Blood Culture adequate volume Performed at [facility], [address] [place], [place] [address]    Culture PENDING  Incomplete   Report Status PENDING  Incomplete  MRSA Next Gen by PCR, Nasal     Status: None   Collection Time: 02/18/24  9:57 PM   Specimen: Nasal Mucosa; Nasal Swab  Result Value Ref Range Status   MRSA by PCR Next Gen NOT DETECTED NOT DETECTED Final    Comment: (NOTE) The GeneXpert MRSA Assay (FDA approved for NASAL specimens only), is one component of a comprehensive MRSA colonization surveillance program. It is not intended to diagnose MRSA infection nor to guide or monitor treatment for MRSA infections. Test performance is not FDA approved in patients less than 10 years old. Performed at [facility], [address] [place], [place] [address]    Creatinine: Recent Labs    02/13/24 0244 02/18/24 1825 02/18/24 2305 02/19/24 0350  CREATININE 1.80* 2.38* 2.83* 2.77*    Impression/Assessment:  Sepsis secondary to UTI Left ureteral calculus status post ureteral stent  Plan:  Stent is in good position.  Agree with Foley catheter for now until he clinically improves and then can undergo voiding trial.  Do not recommend any surgical intervention.  He will need definitive management of the stone after a week or so of culture specific antibiotics and full recovery from this septic event.  Sherwood JONETTA Edison, III 02/19/2024, 8:24 AM

## 2024-02-19 NOTE — Consult Note (Signed)
 Infectious Disease     Reason for Consult:Sepsis   Referring Physician: Dr Claudene Date of Admission:  02/18/2024   Principal Problem:   Septic shock (HCC)  HPI: Marcus Simpson is a 78 y.o. male admitted with sepsis after recent admit L pyelonephritis and stone and MRSA bactremia.  He has a hs as well of HTN, DM-2, anemia of chronic illness, prostate Ca, gout, and CKD-3b, During recent admit from 11/16-24 he had left acute pyelonephritis/left ureter stone, s/p left ureter stent, and MRSE bacteremia.   UCX were mixed at time.  Also found at last admit to have large effusion L knee with tap done cw gout.  TTE was neg, refused TEE.  He was dced with planned 6 week course of Daptomycin  through right arm PICC for presumed IE since  This admit he presented with  N/V x1, fever 102 F, fatigue, and hypotension. Given 3.5 L LR and BP improved. Started on Vanco and Zosyn. Transferred from [facility] for [facility] and ID eval. WBC on admit was 63 K., cr 2.77 (up fropm 1.8)  UA + > 50 wbc. ERP neg BCX UCX pending 11/29 CXR neg CT abd with stable stent, no hydro, bil stones present, + bladder thickening  Started on vanco and zosyn. PICC line had no redness or pain but  site dressing was not intact.  SInce admit feels much better. HR BP stable. WBC down to 49 and Afebrile.   Past Medical History:  Diagnosis Date   Arthritis    Bradycardia    asymptomatic    Diabetes mellitus without complication (HCC)    History of kidney stones    Hypertension    Prostate cancer [facility])    Past Surgical History:  Procedure Laterality Date   COLONOSCOPY     CYSTOSCOPY WITH STENT PLACEMENT Right 12/28/2022   Procedure: CYSTOSCOPY WITH STENT PLACEMENT;  Surgeon: Carolee Sherwood BIRCH DOUGLAS, MD;  Location: [facility] OR;  Service: Urology;  Laterality: Right;   CYSTOSCOPY WITH STENT PLACEMENT Left 02/05/2024   Procedure: CYSTOSCOPY, WITH STENT INSERTION. URETHRAL DILATION;  Surgeon: Shane Steffan BROCKS, MD;  Location: [facility] ORS;  Service:  Urology;  Laterality: Left;   EXTRACORPOREAL SHOCK WAVE LITHOTRIPSY Right 01/25/2023   Procedure: EXTRACORPOREAL SHOCK WAVE LITHOTRIPSY (ESWL);  Surgeon: [name], MD;  Location: [facility] ORS;  Service: Urology;  Laterality: Right;   FLEXIBLE SIGMOIDOSCOPY N/A 04/02/2015   Procedure: FLEXIBLE SIGMOIDOSCOPY;  Surgeon: Claudis RAYMOND Rivet, MD;  Location: [facility] ENDO SUITE;  Service: Endoscopy;  Laterality: N/A;  1:45 - moved to 1/11 @ 8:30 - Ann to notify   KIDNEY STONE SURGERY     RADIOACTIVE SEED IMPLANT N/A 12/29/2023   Procedure: INSERTION, RADIATION SOURCE, PROSTATE;  Surgeon: Sherrilee Belvie CROME, MD;  Location: [facility] ORS;  Service: Urology;  Laterality: N/A;   SPACE OAR INSTILLATION N/A 12/29/2023   Procedure: INJECTION, HYDROGEL SPACER;  Surgeon: Sherrilee Belvie CROME, MD;  Location: [facility] ORS;  Service: Urology;  Laterality: N/A;   TOTAL KNEE ARTHROPLASTY Left 05/02/2017   Procedure: LEFT TOTAL KNEE ARTHROPLASTY;  Surgeon: Melodi Lerner, MD;  Location: [facility] ORS;  Service: Orthopedics;  Laterality: Left;   Social History   Tobacco Use   Smoking status: Former    Types: Cigarettes   Smokeless tobacco: Never   Tobacco comments:    quit at age 34   Vaping Use   Vaping status: Never Used  Substance Use Topics   Alcohol use: No    Alcohol/week: 0.0 standard drinks of alcohol  Drug use: No   No family history on file.  Allergies:  Allergies  Allergen Reactions   Dilaudid [Hydromorphone Hcl] Anaphylaxis and Other (See Comments)    Stops heart    Current antibiotics: Antibiotics Given (last 72 hours)     Date/Time Action Medication Dose Rate   02/18/24 1839 New Bag/Given   piperacillin-tazobactam (ZOSYN) IVPB 3.375 g 3.375 g 100 mL/hr   02/18/24 1839 New Bag/Given   vancomycin  (VANCOCIN ) IVPB 1000 mg/200 mL premix 1,000 mg 200 mL/hr   02/19/24 0042 New Bag/Given   vancomycin  (VANCOREADY) IVPB 1500 mg/300 mL 1,500 mg 150 mL/hr   02/19/24 0207 New Bag/Given   piperacillin-tazobactam (ZOSYN)  IVPB 3.375 g 3.375 g 12.5 mL/hr   02/19/24 0900 New Bag/Given   piperacillin-tazobactam (ZOSYN) IVPB 3.375 g 3.375 g 12.5 mL/hr       MEDICATIONS:  acetaminophen   1,000 mg Oral Q8H   [START ON 02/20/2024] aspirin   81 mg Oral Daily   Chlorhexidine  Gluconate Cloth  6 each Topical Daily   heparin   5,000 Units Subcutaneous Q8H   insulin  aspart  0-15 Units Subcutaneous Q4H   lidocaine   1 patch Transdermal Q24H   methocarbamol   500 mg Oral TID    Review of Systems - 11 systems reviewed and negative per HPI   OBJECTIVE: Temp:  [97.3 F (36.3 C)-100.4 F (38 C)] 97.5 F (36.4 C) (11/30 1000) Pulse Rate:  [63-192] 69 (11/30 1000) Resp:  [17-32] 19 (11/30 1000) BP: (71-143)/(41-71) 104/64 (11/30 1000) SpO2:  [91 %-100 %] 96 % (11/30 1000) Weight:  [106.6 kg-108.3 kg] 108.3 kg (11/30 0500) Physical Exam  Constitutional: He is oriented to person, place, and time. He appears well-developed and well-nourished. No distress.  HENT: anicter Mouth/Throat: Oropharynx is clear and dry. No oropharyngeal exudate.  Cardiovascular: Normal rate, regular rhythm and normal heart sounds. Pulmonary/Chest: Effort normal and breath sounds normal. No respiratory distress. He has no wheezes.  Abdominal: Soft. Bowel sounds are normal. He exhibits no distension. There is no tenderness.  Lymphadenopathy: He has no cervical adenopathy.  Neurological: He is alert and oriented to person, place, and time.  GU foley in place PICC line RUE wnl Ext R knee mild effusion, good rom, min warmth, L knee TKR wnl Skin: Skin is warm and dry. No rash noted. No erythema.  Psychiatric: He has a normal mood and affect. His behavior is normal.     LABS: Results for orders placed or performed during the hospital encounter of 02/18/24 (from the past 48 hours)  Resp panel by RT-PCR (RSV, Flu A&B, Covid) Anterior Nasal Swab     Status: None   Collection Time: 02/18/24  6:25 PM   Specimen: Anterior Nasal Swab  Result Value Ref  Range   SARS Coronavirus 2 by RT PCR NEGATIVE NEGATIVE    Comment: (NOTE) SARS-CoV-2 target nucleic acids are NOT DETECTED.  The SARS-CoV-2 RNA is generally detectable in upper respiratory specimens during the acute phase of infection. The lowest concentration of SARS-CoV-2 viral copies this assay can detect is 138 copies/mL. A negative result does not preclude SARS-Cov-2 infection and should not be used as the sole basis for treatment or other patient management decisions. A negative result may occur with  improper specimen collection/handling, submission of specimen other than nasopharyngeal swab, presence of viral mutation(s) within the areas targeted by this assay, and inadequate number of viral copies(<138 copies/mL). A negative result must be combined with clinical observations, patient history, and epidemiological information. The expected result  is Negative.  Fact Sheet for Patients:  bloggercourse.com  Fact Sheet for Healthcare Providers:  seriousbroker.it  This test is no t yet approved or cleared by the [place]  FDA and  has been authorized for detection and/or diagnosis of SARS-CoV-2 by FDA under an Emergency Use Authorization (EUA). This EUA will remain  in effect (meaning this test can be used) for the duration of the COVID-19 declaration under Section 564(b)(1) of the Act, 21 U.S.C.section 360bbb-3(b)(1), unless the authorization is terminated  or revoked sooner.       Influenza A by PCR NEGATIVE NEGATIVE   Influenza B by PCR NEGATIVE NEGATIVE    Comment: (NOTE) The Xpert Xpress SARS-CoV-2/FLU/RSV plus assay is intended as an aid in the diagnosis of influenza from Nasopharyngeal swab specimens and should not be used as a sole basis for treatment. Nasal washings and aspirates are unacceptable for Xpert Xpress SARS-CoV-2/FLU/RSV testing.  Fact Sheet for  Patients: bloggercourse.com  Fact Sheet for Healthcare Providers: seriousbroker.it  This test is not yet approved or cleared by the [place]  FDA and has been authorized for detection and/or diagnosis of SARS-CoV-2 by FDA under an Emergency Use Authorization (EUA). This EUA will remain in effect (meaning this test can be used) for the duration of the COVID-19 declaration under Section 564(b)(1) of the Act, 21 U.S.C. section 360bbb-3(b)(1), unless the authorization is terminated or revoked.     Resp Syncytial Virus by PCR NEGATIVE NEGATIVE    Comment: (NOTE) Fact Sheet for Patients: bloggercourse.com  Fact Sheet for Healthcare Providers: seriousbroker.it  This test is not yet approved or cleared by the [place]  FDA and has been authorized for detection and/or diagnosis of SARS-CoV-2 by FDA under an Emergency Use Authorization (EUA). This EUA will remain in effect (meaning this test can be used) for the duration of the COVID-19 declaration under Section 564(b)(1) of the Act, 21 U.S.C. section 360bbb-3(b)(1), unless the authorization is terminated or revoked.  Performed at [facility], [address] [place], [place] [address]   Lactic acid, plasma     Status: Abnormal   Collection Time: 02/18/24  6:25 PM  Result Value Ref Range   Lactic Acid, Venous 5.6 (HH) 0.5 - 1.9 mmol/L    Comment: Critical Value, Read Back and verified with ASHLEY,RN ON 02/18/24 AT 1905 BY PURDIE,J Performed at [facility], [address] [place], [place] [address]   Comprehensive metabolic panel     Status: Abnormal   Collection Time: 02/18/24  6:25 PM  Result Value Ref Range   Sodium 138 135 - 145 mmol/L   Potassium 3.7 3.5 - 5.1 mmol/L   Chloride 106 98 - 111 mmol/L   CO2 15 (L) 22 - 32 mmol/L   Glucose, Bld 122 (H) 70 - 99 mg/dL    Comment: Glucose reference range applies only to samples  taken after fasting for at least 8 hours.   BUN 33 (H) 8 - 23 mg/dL   Creatinine, Ser 7.61 (H) 0.61 - 1.24 mg/dL   Calcium  7.3 (L) 8.9 - 10.3 mg/dL   Total Protein 5.9 (L) 6.5 - 8.1 g/dL   Albumin 3.0 (L) 3.5 - 5.0 g/dL   AST 32 15 - 41 U/L   ALT 29 0 - 44 U/L   Alkaline Phosphatase 62 38 - 126 U/L   Total Bilirubin 1.3 (H) 0.0 - 1.2 mg/dL   GFR, Estimated 27 (L) >60 mL/min    Comment: (NOTE) Calculated using the CKD-EPI Creatinine Equation (2021)  Anion gap 18 (H) 5 - 15    Comment: Performed at [facility], [address], [place], [place] [address]  CBC with Differential     Status: Abnormal   Collection Time: 02/18/24  6:25 PM  Result Value Ref Range   WBC 63.9 (HH) 4.0 - 10.5 K/uL    Comment: This critical result has been called to GUY,N by [place] on 02/18/2024 19:01:34, and has been read back.   RBC 3.92 (L) 4.22 - 5.81 MIL/uL   Hemoglobin 11.9 (L) 13.0 - 17.0 g/dL   HCT 63.4 (L) 60.9 - 47.9 %   MCV 93.1 80.0 - 100.0 fL   MCH 30.4 26.0 - 34.0 pg   MCHC 32.6 30.0 - 36.0 g/dL   RDW 86.6 88.4 - 84.4 %   Platelets 422 (H) 150 - 400 K/uL   nRBC 0.0 0.0 - 0.2 %   Neutrophils Relative % 81 %   Neutro Abs 51.5 (H) 1.7 - 7.7 K/uL   Lymphocytes Relative 3 %   Lymphs Abs 1.8 0.7 - 4.0 K/uL   Monocytes Relative 6 %   Monocytes Absolute 3.9 (H) 0.1 - 1.0 K/uL   Eosinophils Relative 0 %   Eosinophils Absolute 0.0 0.0 - 0.5 K/uL   Basophils Relative 0 %   Basophils Absolute 0.2 (H) 0.0 - 0.1 K/uL   WBC Morphology MORPHOLOGY UNREMARKABLE    RBC Morphology MORPHOLOGY UNREMARKABLE    Immature Granulocytes 10 %   Abs Immature Granulocytes 6.58 (H) 0.00 - 0.07 K/uL    Comment: Performed at [facility], [address], [place], [place] [address]  Protime-INR     Status: Abnormal   Collection Time: 02/18/24  6:25 PM  Result Value Ref Range   Prothrombin Time 16.4 (H) 11.4 - 15.2 seconds   INR 1.3 (H) 0.8 - 1.2    Comment: (NOTE) INR goal varies based on device and disease  states. Performed at [facility], [address], [place], [place] [address]   Blood Culture (routine x 2)     Status: None (Preliminary result)   Collection Time: 02/18/24  6:25 PM   Specimen: BLOOD  Result Value Ref Range   Specimen Description BLOOD LEFT ANTECUBITAL    Special Requests      BOTTLES DRAWN AEROBIC AND ANAEROBIC Blood Culture adequate volume   Culture      NO GROWTH < 24 HOURS Performed at [facility], [address], [place], [place] [address]    Report Status PENDING   Blood Culture (routine x 2)     Status: None (Preliminary result)   Collection Time: 02/18/24  6:30 PM   Specimen: BLOOD  Result Value Ref Range   Specimen Description BLOOD BLOOD RIGHT ARM    Special Requests      BOTTLES DRAWN AEROBIC AND ANAEROBIC Blood Culture adequate volume   Culture      NO GROWTH < 24 HOURS Performed at [facility], [address] [place], [place] [address]    Report Status PENDING   Lactic acid, plasma     Status: Abnormal   Collection Time: 02/18/24  8:41 PM  Result Value Ref Range   Lactic Acid, Venous 7.2 (HH) 0.5 - 1.9 mmol/L    Comment: Critical Value, Read Back and verified with PRUITT,G ON 02/18/24 AT 2010 BY PURDIE,J Performed at [facility], [address] [place], [place] [address]   MRSA Next Gen by PCR, Nasal     Status: None  Collection Time: 02/18/24  9:57 PM   Specimen: Nasal Mucosa; Nasal Swab  Result Value Ref Range   MRSA by PCR Next Gen NOT DETECTED NOT DETECTED    Comment: (NOTE) The GeneXpert MRSA Assay (FDA approved for NASAL specimens only), is one component of a comprehensive MRSA colonization surveillance program. It is not intended to diagnose MRSA infection nor to guide or monitor treatment for MRSA infections. Test performance is not FDA approved in patients less than 58 years old. Performed at [facility], [address], [place], [place] [address]   Lactic acid, plasma     Status: Abnormal   Collection Time: 02/18/24  10:19 PM  Result Value Ref Range   Lactic Acid, Venous 5.0 (HH) 0.5 - 1.9 mmol/L    Comment: CRITICAL RESULT CALLED TO, READ BACK BY AND VERIFIED WITH M.DUNN RN 2255 02/18/2024 BY G.GANADEN Performed at [facility], [address] [place], [place] [address]   Urinalysis, w/ Reflex to Culture (Infection Suspected) -Urine, Catheterized     Status: Abnormal   Collection Time: 02/18/24 10:45 PM  Result Value Ref Range   Specimen Source URINE, CATHETERIZED    Color, Urine YELLOW YELLOW   APPearance CLOUDY (A) CLEAR   Specific Gravity, Urine 1.020 1.005 - 1.030   pH 6.0 5.0 - 8.0   Glucose, UA NEGATIVE NEGATIVE mg/dL   Hgb urine dipstick LARGE (A) NEGATIVE   Bilirubin Urine NEGATIVE NEGATIVE   Ketones, ur NEGATIVE NEGATIVE mg/dL   Protein, ur >699 (A) NEGATIVE mg/dL   Nitrite POSITIVE (A) NEGATIVE   Leukocytes,Ua MODERATE (A) NEGATIVE   Squamous Epithelial / HPF 0-5 0 - 5 /HPF   WBC, UA >50 0 - 5 WBC/hpf    Comment: Reflex urine culture not performed if WBC <=10, OR if Squamous epithelial cells >5. If Squamous epithelial cells >5, suggest recollection.   RBC / HPF 21-50 0 - 5 RBC/hpf   Bacteria, UA MANY (A) NONE SEEN   WBC Clumps PRESENT    Mucus PRESENT     Comment: Performed at [facility], [address] [place], [place] [address]  Brain natriuretic peptide     Status: Abnormal   Collection Time: 02/18/24 11:05 PM  Result Value Ref Range   B Natriuretic Peptide 312.3 (H) 0.0 - 100.0 pg/mL    Comment: Performed at [facility], [address], [place], [place] [address]  Basic metabolic panel     Status: Abnormal   Collection Time: 02/18/24 11:05 PM  Result Value Ref Range   Sodium 131 (L) 135 - 145 mmol/L   Potassium 4.6 3.5 - 5.1 mmol/L   Chloride 97 (L) 98 - 111 mmol/L   CO2 21 (L) 22 - 32 mmol/L   Glucose, Bld 119 (H) 70 - 99 mg/dL    Comment: Glucose reference range applies only to samples taken after fasting for at least 8 hours.   BUN 37 (H) 8 - 23  mg/dL   Creatinine, Ser 7.16 (H) 0.61 - 1.24 mg/dL   Calcium  7.9 (L) 8.9 - 10.3 mg/dL   GFR, Estimated 22 (L) >60 mL/min    Comment: (NOTE) Calculated using the CKD-EPI Creatinine Equation (2021)    Anion gap 13 5 - 15    Comment: Performed at [facility], [address], [place], [place] [address]  Magnesium      Status: Abnormal   Collection Time: 02/18/24 11:05 PM  Result Value Ref Range  Magnesium  1.2 (L) 1.7 - 2.4 mg/dL    Comment: Performed at [facility], [address] [place], [place] [address]  Phosphorus     Status: None   Collection Time: 02/18/24 11:05 PM  Result Value Ref Range   Phosphorus 3.2 2.5 - 4.6 mg/dL    Comment: Performed at [facility], [address], [place], [place] [address]  POCT I-Stat EG7     Status: Abnormal   Collection Time: 02/18/24 11:10 PM  Result Value Ref Range   pH, Ven 7.386 7.25 - 7.43   pCO2, Ven 40.0 (L) 44 - 60 mmHg   pO2, Ven 36 32 - 45 mmHg   Bicarbonate 23.7 20.0 - 28.0 mmol/L   TCO2 25 22 - 32 mmol/L   O2 Saturation 64 %   Acid-base deficit 1.0 0.0 - 2.0 mmol/L   Sodium 132 (L) 135 - 145 mmol/L   Potassium 4.9 3.5 - 5.1 mmol/L   Calcium , Ion 1.09 (L) 1.15 - 1.40 mmol/L   HCT 30.0 (L) 39.0 - 52.0 %   Hemoglobin 10.2 (L) 13.0 - 17.0 g/dL   Patient temperature 61.8 C    Sample type VENOUS    Comment VALUES VERIFIED BY LAB OR ISTAT   Glucose, capillary     Status: None   Collection Time: 02/18/24 11:54 PM  Result Value Ref Range   Glucose-Capillary 92 70 - 99 mg/dL    Comment: Glucose reference range applies only to samples taken after fasting for at least 8 hours.  Glucose, capillary     Status: Abnormal   Collection Time: 02/19/24  3:18 AM  Result Value Ref Range   Glucose-Capillary 127 (H) 70 - 99 mg/dL    Comment: Glucose reference range applies only to samples taken after fasting for at least 8 hours.  Basic metabolic panel     Status: Abnormal   Collection Time: 02/19/24  3:50 AM  Result Value  Ref Range   Sodium 132 (L) 135 - 145 mmol/L   Potassium 5.0 3.5 - 5.1 mmol/L   Chloride 98 98 - 111 mmol/L   CO2 21 (L) 22 - 32 mmol/L   Glucose, Bld 127 (H) 70 - 99 mg/dL    Comment: Glucose reference range applies only to samples taken after fasting for at least 8 hours.   BUN 38 (H) 8 - 23 mg/dL   Creatinine, Ser 7.22 (H) 0.61 - 1.24 mg/dL   Calcium  8.4 (L) 8.9 - 10.3 mg/dL   GFR, Estimated 22 (L) >60 mL/min    Comment: (NOTE) Calculated using the CKD-EPI Creatinine Equation (2021)    Anion gap 13 5 - 15    Comment: Performed at [facility], [address], [place], [place] [address]  CBC     Status: Abnormal   Collection Time: 02/19/24  3:50 AM  Result Value Ref Range   WBC 49.8 (H) 4.0 - 10.5 K/uL   RBC 3.21 (L) 4.22 - 5.81 MIL/uL   Hemoglobin 9.8 (L) 13.0 - 17.0 g/dL   HCT 70.2 (L) 60.9 - 47.9 %   MCV 92.5 80.0 - 100.0 fL   MCH 30.5 26.0 - 34.0 pg   MCHC 33.0 30.0 - 36.0 g/dL   RDW 86.6 88.4 - 84.4 %   Platelets 350 150 - 400 K/uL   nRBC 0.0 0.0 - 0.2 %    Comment: Performed at [facility], [address] [place] [address], [place], [place] [address]  Magnesium   Status: None   Collection Time: 02/19/24  3:50 AM  Result Value Ref Range   Magnesium  2.3 1.7 - 2.4 mg/dL    Comment: Performed at [facility], [address], [place], [place] [address]  Phosphorus     Status: None   Collection Time: 02/19/24  3:50 AM  Result Value Ref Range   Phosphorus 4.6 2.5 - 4.6 mg/dL    Comment: Performed at [facility], [address], [place], [place] [address]  Lactic acid, plasma     Status: None   Collection Time: 02/19/24  3:50 AM  Result Value Ref Range   Lactic Acid, Venous 1.7 0.5 - 1.9 mmol/L    Comment: Performed at [facility], [address], [place], [place] [address]  Glucose, capillary     Status: Abnormal   Collection Time: 02/19/24  7:59 AM  Result Value Ref Range   Glucose-Capillary 137 (H) 70 - 99 mg/dL    Comment: Glucose reference range  applies only to samples taken after fasting for at least 8 hours.   No components found for: ESR, C REACTIVE PROTEIN MICRO: Recent Results (from the past 720 hours)  MIC (1 Drug)-Blood culture; 02/05/2024; BLOOD RIGHT FOREARM; MRSE; Daptomycin      Status: Abnormal   Collection Time: 02/05/24 10:31 AM   Specimen: BLOOD RIGHT FOREARM  Result Value Ref Range Status   Min Inhibitory Conc (1 Drug) Final report (A)  Corrected    Comment: (NOTE) Performed At: [facility] [address] [place], [place] [id number] Jennette Shorter MD Ey:[id number] CORRECTED ON 11/23 AT 1535: PREVIOUSLY REPORTED AS Preliminary report    Source BLOOD  Final    Comment: Performed at [facility], [address] [place], [place] [address]  MIC Result     Status: Abnormal   Collection Time: 02/05/24 10:31 AM  Result Value Ref Range Status   Result 1 (MIC) Comment (A)  Final    Comment: (NOTE) Methicillin - resistant Staphylococcus aureus Identification performed by account, not confirmed by this laboratory. Testing performed by broth microdilution. DAPTOMYCIN  <=0.5ug/ml SUSCEPTIBLE Performed At: [facility] [address] [place], [place] [id number] Jennette Shorter MD Ey:[id number]   Resp panel by RT-PCR (RSV, Flu A&B, Covid) Anterior Nasal Swab     Status: None   Collection Time: 02/05/24  1:58 PM   Specimen: Anterior Nasal Swab  Result Value Ref Range Status   SARS Coronavirus 2 by RT PCR NEGATIVE NEGATIVE Final    Comment: (NOTE) SARS-CoV-2 target nucleic acids are NOT DETECTED.  The SARS-CoV-2 RNA is generally detectable in upper respiratory specimens during the acute phase of infection. The lowest concentration of SARS-CoV-2 viral copies this assay can detect is 138 copies/mL. A negative result does not preclude SARS-Cov-2 infection and should not be used as the sole basis for treatment or other patient management decisions. A negative result may occur with  improper  specimen collection/handling, submission of specimen other than nasopharyngeal swab, presence of viral mutation(s) within the areas targeted by this assay, and inadequate number of viral copies(<138 copies/mL). A negative result must be combined with clinical observations, patient history, and epidemiological information. The expected result is Negative.  Fact Sheet for Patients:  bloggercourse.com  Fact Sheet for Healthcare Providers:  seriousbroker.it  This test is no t yet approved or cleared by the [place]  FDA and  has been authorized for detection and/or diagnosis of SARS-CoV-2 by FDA under an Emergency Use Authorization (  EUA). This EUA will remain  in effect (meaning this test can be used) for the duration of the COVID-19 declaration under Section 564(b)(1) of the Act, 21 U.S.C.section 360bbb-3(b)(1), unless the authorization is terminated  or revoked sooner.       Influenza A by PCR NEGATIVE NEGATIVE Final   Influenza B by PCR NEGATIVE NEGATIVE Final    Comment: (NOTE) The Xpert Xpress SARS-CoV-2/FLU/RSV plus assay is intended as an aid in the diagnosis of influenza from Nasopharyngeal swab specimens and should not be used as a sole basis for treatment. Nasal washings and aspirates are unacceptable for Xpert Xpress SARS-CoV-2/FLU/RSV testing.  Fact Sheet for Patients: bloggercourse.com  Fact Sheet for Healthcare Providers: seriousbroker.it  This test is not yet approved or cleared by the [place]  FDA and has been authorized for detection and/or diagnosis of SARS-CoV-2 by FDA under an Emergency Use Authorization (EUA). This EUA will remain in effect (meaning this test can be used) for the duration of the COVID-19 declaration under Section 564(b)(1) of the Act, 21 U.S.C. section 360bbb-3(b)(1), unless the authorization is terminated or revoked.     Resp  Syncytial Virus by PCR NEGATIVE NEGATIVE Final    Comment: (NOTE) Fact Sheet for Patients: bloggercourse.com  Fact Sheet for Healthcare Providers: seriousbroker.it  This test is not yet approved or cleared by the [place]  FDA and has been authorized for detection and/or diagnosis of SARS-CoV-2 by FDA under an Emergency Use Authorization (EUA). This EUA will remain in effect (meaning this test can be used) for the duration of the COVID-19 declaration under Section 564(b)(1) of the Act, 21 U.S.C. section 360bbb-3(b)(1), unless the authorization is terminated or revoked.  Performed at [facility], [address], [place], [place] [address]   Urine Culture     Status: Abnormal   Collection Time: 02/05/24  2:05 PM   Specimen: Urine, Random  Result Value Ref Range Status   Specimen Description   Final    URINE, RANDOM Performed at [facility], [address] [place], [place] [address]    Special Requests   Final    NONE Reflexed from [id number] Performed at [facility], [address] [place], [place] [address]    Culture MULTIPLE SPECIES PRESENT, SUGGEST RECOLLECTION (A)  Final   Report Status 02/07/2024 FINAL  Final  Blood Culture (routine x 2)     Status: Abnormal   Collection Time: 02/05/24  2:09 PM   Specimen: BLOOD  Result Value Ref Range Status   Specimen Description   Final    BLOOD BLOOD RIGHT FOREARM Performed at [facility], [address] [place], [place] [address]    Special Requests   Final    BOTTLES DRAWN AEROBIC AND ANAEROBIC Blood Culture adequate volume Performed at [facility], [address], [place], [place] [address]    Culture  Setup Time   Final    GRAM POSITIVE COCCI IN BOTH AEROBIC AND ANAEROBIC BOTTLES Gram Stain Report Called to,Read Back By and Verified With: L SAUNDERS AT [facility] AT 0812 ON [id number] BY S DALTON    Culture (A)  Final    STAPHYLOCOCCUS EPIDERMIDIS SEE SEPARATE REPORT FOR  DAPTOMYCIN  RESULT Performed at [facility], [address], [place], [place] [address]    Report Status 02/15/2024 FINAL  Final   Organism ID, Bacteria STAPHYLOCOCCUS EPIDERMIDIS  Final      Susceptibility   Staphylococcus epidermidis - MIC*    CIPROFLOXACIN  >=8 RESISTANT Resistant     ERYTHROMYCIN >=  8 RESISTANT Resistant     GENTAMICIN  <=0.5 SENSITIVE Sensitive     OXACILLIN >=4 RESISTANT Resistant     TETRACYCLINE 2 SENSITIVE Sensitive     VANCOMYCIN  1 SENSITIVE Sensitive     TRIMETH/SULFA 80 RESISTANT Resistant     CLINDAMYCIN >=8 RESISTANT Resistant     RIFAMPIN <=0.5 SENSITIVE Sensitive     Inducible Clindamycin NEGATIVE Sensitive     * STAPHYLOCOCCUS EPIDERMIDIS  Blood Culture (routine x 2)     Status: Abnormal   Collection Time: 02/05/24  2:43 PM   Specimen: BLOOD  Result Value Ref Range Status   Specimen Description   Final    BLOOD BLOOD LEFT ARM Performed at [facility], [address], [place], [place] [address]    Special Requests   Final    BOTTLES DRAWN AEROBIC AND ANAEROBIC Blood Culture adequate volume Performed at [facility], [address] [place], [place] [address]    Culture  Setup Time   Final    GRAM POSITIVE COCCI IN BOTH AEROBIC AND ANAEROBIC BOTTLES Gram Stain Report Called to,Read Back By and Verified With: L SAUNDERS AT [facility] AT 0812 ON [id number] BY S DALTON CRITICAL RESULT CALLED TO, READ BACK BY AND VERIFIED WITH: PHARMD DREW WOFFORD ON 02/06/24 @ 1517 BY DRT Performed at [facility], [address] [place], [place] [address]    Culture STAPHYLOCOCCUS EPIDERMIDIS (A)  Final   Report Status 02/08/2024 FINAL  Final   Organism ID, Bacteria STAPHYLOCOCCUS EPIDERMIDIS  Final      Susceptibility   Staphylococcus epidermidis - MIC*    CIPROFLOXACIN  >=8 RESISTANT Resistant     ERYTHROMYCIN >=8 RESISTANT Resistant     GENTAMICIN  <=0.5 SENSITIVE Sensitive     OXACILLIN >=4 RESISTANT Resistant     TETRACYCLINE 2 SENSITIVE Sensitive      VANCOMYCIN  1 SENSITIVE Sensitive     TRIMETH/SULFA 160 RESISTANT Resistant     CLINDAMYCIN >=8 RESISTANT Resistant     RIFAMPIN <=0.5 SENSITIVE Sensitive     Inducible Clindamycin NEGATIVE Sensitive     * STAPHYLOCOCCUS EPIDERMIDIS  Blood Culture ID Panel (Reflexed)     Status: Abnormal   Collection Time: 02/05/24  2:43 PM  Result Value Ref Range Status   Enterococcus faecalis NOT DETECTED NOT DETECTED Final   Enterococcus Faecium NOT DETECTED NOT DETECTED Final   Listeria monocytogenes NOT DETECTED NOT DETECTED Final   Staphylococcus species DETECTED (A) NOT DETECTED Final    Comment: CRITICAL RESULT CALLED TO, READ BACK BY AND VERIFIED WITH: PHARMD DREW WOFFORD ON 02/06/24 @ 1517 BY DRT    Staphylococcus aureus (BCID) NOT DETECTED NOT DETECTED Final   Staphylococcus epidermidis DETECTED (A) NOT DETECTED Final    Comment: Methicillin (oxacillin) resistant coagulase negative staphylococcus. Possible blood culture contaminant (unless isolated from more than one blood culture draw or clinical case suggests pathogenicity). No antibiotic treatment is indicated for blood  culture contaminants. CRITICAL RESULT CALLED TO, READ BACK BY AND VERIFIED WITH: PHARMD DREW WOFFORD ON 02/06/24 @ 1517 BY DRT    Staphylococcus lugdunensis NOT DETECTED NOT DETECTED Final   Streptococcus species NOT DETECTED NOT DETECTED Final   Streptococcus agalactiae NOT DETECTED NOT DETECTED Final   Streptococcus pneumoniae NOT DETECTED NOT DETECTED Final   Streptococcus pyogenes NOT DETECTED NOT DETECTED Final   A.calcoaceticus-baumannii NOT DETECTED NOT DETECTED Final   Bacteroides fragilis NOT DETECTED NOT DETECTED Final   Enterobacterales NOT DETECTED NOT DETECTED Final   Enterobacter cloacae complex NOT DETECTED  NOT DETECTED Final   Escherichia coli NOT DETECTED NOT DETECTED Final   Klebsiella aerogenes NOT DETECTED NOT DETECTED Final   Klebsiella oxytoca NOT DETECTED NOT DETECTED Final   Klebsiella  pneumoniae NOT DETECTED NOT DETECTED Final   Proteus species NOT DETECTED NOT DETECTED Final   Salmonella species NOT DETECTED NOT DETECTED Final   Serratia marcescens NOT DETECTED NOT DETECTED Final   Haemophilus influenzae NOT DETECTED NOT DETECTED Final   Neisseria meningitidis NOT DETECTED NOT DETECTED Final   Pseudomonas aeruginosa NOT DETECTED NOT DETECTED Final   Stenotrophomonas maltophilia NOT DETECTED NOT DETECTED Final   Candida albicans NOT DETECTED NOT DETECTED Final   Candida auris NOT DETECTED NOT DETECTED Final   Candida glabrata NOT DETECTED NOT DETECTED Final   Candida krusei NOT DETECTED NOT DETECTED Final   Candida parapsilosis NOT DETECTED NOT DETECTED Final   Candida tropicalis NOT DETECTED NOT DETECTED Final   Cryptococcus neoformans/gattii NOT DETECTED NOT DETECTED Final   Methicillin resistance mecA/C DETECTED (A) NOT DETECTED Final    Comment: CRITICAL RESULT CALLED TO, READ BACK BY AND VERIFIED WITH: PHARMD DREW WOFFORD ON 02/06/24 @ 1517 BY DRT Performed at [facility], [address], [place], [place] [address]   Urine Culture     Status: Abnormal   Collection Time: 02/05/24  8:41 PM   Specimen: Urine, Cystoscope  Result Value Ref Range Status   Specimen Description   Final    URINE, RANDOM Performed at [facility], [address], [place], [place] [address]    Special Requests   Final    NONE Performed at [facility], [address], [place], [place] [address]    Culture MULTIPLE SPECIES PRESENT, SUGGEST RECOLLECTION (A)  Final   Report Status 02/07/2024 FINAL  Final  Culture, blood (Routine X 2) w Reflex to ID Panel     Status: None   Collection Time: 02/07/24 11:23 AM   Specimen: BLOOD RIGHT HAND  Result Value Ref Range Status   Specimen Description BLOOD RIGHT HAND  Final   Special Requests   Final    BOTTLES DRAWN AEROBIC ONLY Blood Culture results may not be optimal due to an inadequate volume of  blood received in culture bottles   Culture   Final    NO GROWTH 6 DAYS Performed at [facility], [address], [place], [place] [address]    Report Status 02/13/2024 FINAL  Final  Culture, blood (Routine X 2) w Reflex to ID Panel     Status: None   Collection Time: 02/07/24 11:27 AM   Specimen: BLOOD RIGHT HAND  Result Value Ref Range Status   Specimen Description BLOOD RIGHT HAND  Final   Special Requests   Final    BOTTLES DRAWN AEROBIC ONLY Blood Culture results may not be optimal due to an inadequate volume of blood received in culture bottles   Culture   Final    NO GROWTH 6 DAYS Performed at [facility], [address], [place], [place] [address]    Report Status 02/13/2024 FINAL  Final  Body fluid culture w Gram Stain     Status: None   Collection Time: 02/10/24  5:11 PM   Specimen: Synovium; Synovial Fluid  Result Value Ref Range Status   Specimen Description   Final    SYNOVIAL Performed at [facility], [address], [place], [place] [address]    Special Requests  Final     R KNEE Performed at [facility], [address] [place], [place] [address]    Gram Stain   Final    ABUNDANT WBC PRESENT, PREDOMINANTLY PMN NO ORGANISMS SEEN    Culture   Final    NO GROWTH 3 DAYS Performed at [facility], [address] [place], [place] [address]    Report Status 02/14/2024 FINAL  Final  Anaerobic culture w Gram Stain     Status: None   Collection Time: 02/10/24  5:11 PM   Specimen: Synovium; Synovial Fluid  Result Value Ref Range Status   Specimen Description   Final    SYNOVIAL Performed at [facility], [address], [place], [place] [address]    Special Requests   Final    R KNEE Performed at [facility], [address], [place], [place] [address]    Gram Stain   Final    FEW WBC PRESENT, PREDOMINANTLY PMN NO ORGANISMS SEEN    Culture   Final    NO  ANAEROBES ISOLATED Performed at [facility], [address] [place], [place] [address]    Report Status 02/15/2024 FINAL  Final  Resp panel by RT-PCR (RSV, Flu A&B, Covid) Anterior Nasal Swab     Status: None   Collection Time: 02/18/24  6:25 PM   Specimen: Anterior Nasal Swab  Result Value Ref Range Status   SARS Coronavirus 2 by RT PCR NEGATIVE NEGATIVE Final    Comment: (NOTE) SARS-CoV-2 target nucleic acids are NOT DETECTED.  The SARS-CoV-2 RNA is generally detectable in upper respiratory specimens during the acute phase of infection. The lowest concentration of SARS-CoV-2 viral copies this assay can detect is 138 copies/mL. A negative result does not preclude SARS-Cov-2 infection and should not be used as the sole basis for treatment or other patient management decisions. A negative result may occur with  improper specimen collection/handling, submission of specimen other than nasopharyngeal swab, presence of viral mutation(s) within the areas targeted by this assay, and inadequate number of viral copies(<138 copies/mL). A negative result must be combined with clinical observations, patient history, and epidemiological information. The expected result is Negative.  Fact Sheet for Patients:  bloggercourse.com  Fact Sheet for Healthcare Providers:  seriousbroker.it  This test is no t yet approved or cleared by the [place]  FDA and  has been authorized for detection and/or diagnosis of SARS-CoV-2 by FDA under an Emergency Use Authorization (EUA). This EUA will remain  in effect (meaning this test can be used) for the duration of the COVID-19 declaration under Section 564(b)(1) of the Act, 21 U.S.C.section 360bbb-3(b)(1), unless the authorization is terminated  or revoked sooner.       Influenza A by PCR NEGATIVE NEGATIVE Final   Influenza B by PCR NEGATIVE NEGATIVE Final    Comment: (NOTE) The Xpert Xpress  SARS-CoV-2/FLU/RSV plus assay is intended as an aid in the diagnosis of influenza from Nasopharyngeal swab specimens and should not be used as a sole basis for treatment. Nasal washings and aspirates are unacceptable for Xpert Xpress SARS-CoV-2/FLU/RSV testing.  Fact Sheet for Patients: bloggercourse.com  Fact Sheet for Healthcare Providers: seriousbroker.it  This test is not yet approved or cleared by the [place]  FDA and has been authorized for detection and/or diagnosis of SARS-CoV-2 by FDA under an Emergency Use Authorization (EUA). This EUA will remain in effect (meaning this test can be used) for the duration of  the COVID-19 declaration under Section 564(b)(1) of the Act, 21 U.S.C. section 360bbb-3(b)(1), unless the authorization is terminated or revoked.     Resp Syncytial Virus by PCR NEGATIVE NEGATIVE Final    Comment: (NOTE) Fact Sheet for Patients: bloggercourse.com  Fact Sheet for Healthcare Providers: seriousbroker.it  This test is not yet approved or cleared by the [place]  FDA and has been authorized for detection and/or diagnosis of SARS-CoV-2 by FDA under an Emergency Use Authorization (EUA). This EUA will remain in effect (meaning this test can be used) for the duration of the COVID-19 declaration under Section 564(b)(1) of the Act, 21 U.S.C. section 360bbb-3(b)(1), unless the authorization is terminated or revoked.  Performed at [facility], [address] [place], [place] [address]   Blood Culture (routine x 2)     Status: None (Preliminary result)   Collection Time: 02/18/24  6:25 PM   Specimen: BLOOD  Result Value Ref Range Status   Specimen Description BLOOD LEFT ANTECUBITAL  Final   Special Requests   Final    BOTTLES DRAWN AEROBIC AND ANAEROBIC Blood Culture adequate volume   Culture   Final    NO GROWTH < 24 HOURS Performed at [facility], [address] [place], [place] [address]    Report Status PENDING  Incomplete  Blood Culture (routine x 2)     Status: None (Preliminary result)   Collection Time: 02/18/24  6:30 PM   Specimen: BLOOD  Result Value Ref Range Status   Specimen Description BLOOD BLOOD RIGHT ARM  Final   Special Requests   Final    BOTTLES DRAWN AEROBIC AND ANAEROBIC Blood Culture adequate volume   Culture   Final    NO GROWTH < 24 HOURS Performed at [facility], [address], [place], [place] [address]    Report Status PENDING  Incomplete  MRSA Next Gen by PCR, Nasal     Status: None   Collection Time: 02/18/24  9:57 PM   Specimen: Nasal Mucosa; Nasal Swab  Result Value Ref Range Status   MRSA by PCR Next Gen NOT DETECTED NOT DETECTED Final    Comment: (NOTE) The GeneXpert MRSA Assay (FDA approved for NASAL specimens only), is one component of a comprehensive MRSA colonization surveillance program. It is not intended to diagnose MRSA infection nor to guide or monitor treatment for MRSA infections. Test performance is not FDA approved in patients less than 88 years old. Performed at [facility], [address], [place], [place] [address]     IMAGING: DG Abd 1 View Result Date: 02/18/2024 EXAM: 1 VIEW XRAY OF THE ABDOMEN 02/18/2024 10:58:00 PM COMPARISON: 02/18/2024 CLINICAL HISTORY: Abdominal distention FINDINGS: BOWEL: Nonobstructive bowel gas pattern. SOFT TISSUES: Left ureteral stent in place. Prostate brachytherapy seeds noted. 7 mm left mid ureteral calculus adjacent to the stent. 9 mm calculus projects over the right kidney. BONES: No acute osseous abnormality. IMPRESSION: 1. 7 mm left mid ureteral calculus adjacent to the stent. 2. 9 mm calculus projects over the right kidney. Electronically signed by: Norman Gatlin MD 02/18/2024 11:05 PM EST RP Workstation: [id number]   CT ABDOMEN PELVIS WO CONTRAST Result Date: 02/18/2024 CLINICAL DATA:  Left upper quadrant pain. EXAM: CT  ABDOMEN AND PELVIS WITHOUT CONTRAST TECHNIQUE: Multidetector CT imaging of the abdomen and pelvis was performed following the standard protocol without IV contrast. RADIATION DOSE REDUCTION: This exam was performed according to the departmental dose-optimization program which includes automated exposure control, adjustment of the mA and/or  kV according to patient size and/or use of iterative reconstruction technique. COMPARISON:  CT abdomen and pelvis 02/05/2024. FINDINGS: Lower chest: No acute abnormality. Hepatobiliary: No focal liver abnormality is seen. No gallstones, gallbladder wall thickening, or biliary dilatation. Pancreas: Unremarkable. No pancreatic ductal dilatation or surrounding inflammatory changes. Spleen: Normal in size without focal abnormality. Adrenals/Urinary Tract: There is a new left ureteral stent in place in proper position. There is no left-sided hydronephrosis. Stable position of calculus in the left mid ureter measuring 5 mm. Bilateral renal calculi are otherwise stable. Bilateral renal cysts have not significantly changed. Adrenal glands are within normal limits. There is diffuse bladder wall thickening versus normal under distension. Stomach/Bowel: Stomach is within normal limits. No evidence of bowel wall thickening, distention, or inflammatory changes. The appendix is not visualized. Vascular/Lymphatic: Aortic atherosclerosis. No enlarged abdominal or pelvic lymph nodes. There are few nonenlarged left retroperitoneal lymph nodes, unchanged. Reproductive: Prostate radiotherapy seeds are present. Other: There are small fat containing inguinal hernias. There is no ascites. Musculoskeletal: The bones are osteopenic. Chronic compression deformities of T12 and L1 appear unchanged. IMPRESSION: 1. New left ureteral stent in place. No hydronephrosis. 2. Stable position of 5 mm calculus in the left mid ureter. 3. Stable bilateral renal calculi. 4. Diffuse bladder wall thickening versus normal  under distension. Correlate clinically for cystitis. 5. Aortic atherosclerosis. Aortic Atherosclerosis (ICD10-I70.0). Electronically Signed   By: Greig Pique M.D.   On: 02/18/2024 19:54   DG Chest Port 1 View Result Date: 02/18/2024 CLINICAL DATA:  Sepsis EXAM: PORTABLE CHEST 1 VIEW COMPARISON:  Chest x-ray 02/05/2024 FINDINGS: Right upper extremity PICC terminates in the SVC. The heart size and mediastinal contours are within normal limits. Both lungs are clear. The visualized skeletal structures are unremarkable. IMPRESSION: No active disease. Electronically Signed   By: Greig Pique M.D.   On: 02/18/2024 18:42   DG Knee Complete 4 Views Right Result Date: 02/10/2024 EXAM: 4 OR MORE VIEW(S) XRAY OF THE KNEE 02/10/2024 10:42:00 AM COMPARISON: None available. CLINICAL HISTORY: 13689 Swelling [id number] FINDINGS: BONES AND JOINTS: No acute fracture. No focal osseous lesion. No joint dislocation. Mild tri-compartment degenerative changes. Chondrocalcinosis. Large suprapatellar knee joint effusion . SOFT TISSUES: The soft tissues are unremarkable. IMPRESSION: 1. Large suprapatellar knee joint effusion. 2. Tricompartmental osteoarthritis. Electronically signed by: Waddell Calk MD 02/10/2024 02:07 PM EST RP Workstation: [id number]   US  EKG SITE RITE Result Date: 02/10/2024 If Site Rite image not attached, placement could not be confirmed due to current cardiac rhythm.  US  EKG SITE RITE Result Date: 02/10/2024 If Site Rite image not attached, placement could not be confirmed due to current cardiac rhythm.  ECHOCARDIOGRAM COMPLETE Result Date: 02/07/2024    ECHOCARDIOGRAM REPORT   Patient Name:   LAYDEN CATERINO Krichbaum Date of Exam: 02/07/2024 Medical Rec #:  [id number]         Height:       71.0 in Accession #:    [id number]        Weight:       249.1 lb Date of Birth:  September 29, 1945          BSA:          2.315 m Patient Age:    78 years          BP:           127/67 mmHg Patient Gender: M                 HR:  66 bpm. Exam Location:  Inpatient Procedure: 2D Echo and Intracardiac Opacification Agent (Both Spectral and Color            Flow Doppler were utilized during procedure). Indications:    Endocarditis  History:        Patient has no prior history of Echocardiogram examinations.  Sonographer:    Charmaine Gaskins Referring Phys: [id number] [facility] IMPRESSIONS  1. NO obvious vegetations Recommend TEE if clinically indicated.  2. Left ventricular ejection fraction, by estimation, is 60 to 65%. The left ventricle has normal function. The left ventricle has no regional wall motion abnormalities. Left ventricular diastolic parameters were normal.  3. Right ventricular systolic function is normal. The right ventricular size is normal.  4. The mitral valve is normal in structure. Trivial mitral valve regurgitation.  5. The aortic valve is normal in structure. Aortic valve regurgitation is not visualized.  6. Aortic dilatation noted. There is mild dilatation of the ascending aorta, measuring 41 mm. FINDINGS  Left Ventricle: Left ventricular ejection fraction, by estimation, is 60 to 65%. The left ventricle has normal function. The left ventricle has no regional wall motion abnormalities. Definity  contrast agent was given IV to delineate the left ventricular  endocardial borders. The left ventricular internal cavity size was normal in size. There is no left ventricular hypertrophy. Left ventricular diastolic parameters were normal. Right Ventricle: The right ventricular size is normal. Right vetricular wall thickness was not assessed. Right ventricular systolic function is normal. Left Atrium: Left atrial size was normal in size. Right Atrium: Right atrial size was normal in size. Pericardium: There is no evidence of pericardial effusion. Mitral Valve: The mitral valve is normal in structure. Trivial mitral valve regurgitation. Tricuspid Valve: The tricuspid valve is normal in structure. Tricuspid valve regurgitation  is mild. Aortic Valve: The aortic valve is normal in structure. Aortic valve regurgitation is not visualized. Pulmonic Valve: The pulmonic valve was normal in structure. Pulmonic valve regurgitation is mild. Aorta: The aortic root is normal in size and structure and aortic dilatation noted. There is mild dilatation of the ascending aorta, measuring 41 mm. IAS/Shunts: No atrial level shunt detected by color flow Doppler.  LEFT VENTRICLE PLAX 2D LVIDd:         4.90 cm   Diastology LVIDs:         2.80 cm   LV e' medial:    6.64 cm/s LV PW:         0.90 cm   LV E/e' medial:  11.8 LV IVS:        0.90 cm   LV e' lateral:   7.18 cm/s LVOT diam:     2.20 cm   LV E/e' lateral: 10.9 LVOT Area:     3.80 cm  RIGHT VENTRICLE RV Basal diam:  3.30 cm RV Mid diam:    3.50 cm RV S prime:     14.10 cm/s LEFT ATRIUM             Index        RIGHT ATRIUM           Index LA Vol (A2C):   78.4 ml 33.87 ml/m  RA Area:     16.80 cm LA Vol (A4C):   71.1 ml 30.71 ml/m  RA Volume:   41.60 ml  17.97 ml/m LA Biplane Vol: 75.6 ml 32.66 ml/m   AORTA Ao Root diam: 3.50 cm Ao Asc diam:  4.10 cm MITRAL VALVE  TRICUSPID VALVE MV Area (PHT): 3.38 cm    TR Peak grad:   29.4 mmHg MV E velocity: 78.40 cm/s  TR Vmax:        271.00 cm/s MV A velocity: 87.80 cm/s MV E/A ratio:  0.89        SHUNTS                            Systemic Diam: 2.20 cm Vina Gull MD Electronically signed by Vina Gull MD Signature Date/Time: 02/07/2024/2:08:38 PM    Final    DG C-Arm 1-60 Min-No Report Result Date: 02/05/2024 Fluoroscopy was utilized by the requesting physician.  No radiographic interpretation.   CT ABDOMEN PELVIS WO CONTRAST Result Date: 02/05/2024 CLINICAL DATA:  Kidney infection and is on antibiotics, presenting with fever. EXAM: CT ABDOMEN AND PELVIS WITHOUT CONTRAST TECHNIQUE: Multidetector CT imaging of the abdomen and pelvis was performed following the standard protocol without IV contrast. RADIATION DOSE REDUCTION: This exam was  performed according to the departmental dose-optimization program which includes automated exposure control, adjustment of the mA and/or kV according to patient size and/or use of iterative reconstruction technique. COMPARISON:  October 30, 2023 FINDINGS: Lower chest: Mild lingular and left basilar atelectasis is seen. Hepatobiliary: No focal liver abnormality is seen. No gallstones, gallbladder wall thickening, or biliary dilatation. Pancreas: Unremarkable. No pancreatic ductal dilatation or surrounding inflammatory changes. Spleen: Normal in size without focal abnormality. Adrenals/Urinary Tract: Adrenal glands are unremarkable. Kidneys are normal size. Stable bilateral parenchymal and peripelvic renal cysts are seen. Multiple subcentimeter non-obstructing renal calculi of various sizes are seen within both kidneys. A 6 mm obstructing renal calculus is seen within the proximal to mid left ureter with mild left-sided hydronephrosis and hydroureter. There is mild to moderate severity diffuse urinary bladder wall thickening. Stomach/Bowel: Stomach is within normal limits. Appendix appears normal. No evidence of bowel wall thickening, distention, or inflammatory changes. Vascular/Lymphatic: Aortic atherosclerosis. No enlarged abdominal or pelvic lymph nodes. 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 Other: No abdominal wall hernia or abnormality. No abdominopelvic ascites. Musculoskeletal: Multilevel degenerative changes are seen throughout the lumbar spine. IMPRESSION: 1. 6 mm obstructing renal calculus within the proximal to mid left ureter. 2. Multiple bilateral subcentimeter non-obstructing renal calculi. 3. Stable bilateral parenchymal and peripelvic renal cysts. No follow-up imaging is recommended. This recommendation follows ACR consensus guidelines: Management of the Incidental Renal Mass on CT: A White Paper of the ACR Incidental  Findings Committee. J Am Coll Radiol [id number]. 4. Mild to moderate severity diffuse urinary bladder wall thickening which may represent sequelae associated with cystitis. Correlation with urinalysis is recommended. 5. 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 6. Aortic atherosclerosis. Electronically Signed   By: Suzen Dials M.D.   On: 02/05/2024 17:01   DG Chest Port 1 View Result Date: 02/05/2024 CLINICAL DATA:  Possible sepsis.  Kidney infection on antibiotics. EXAM: PORTABLE CHEST 1 VIEW COMPARISON:  10/30/2023, 12/29/2022 FINDINGS: Lungs are hypoinflated with chronic lateral left base opacification. No acute airspace process or effusion. Cardiomediastinal silhouette and remainder of the exam is unchanged. IMPRESSION: Hypoinflation without acute cardiopulmonary disease. Electronically Signed   By: Toribio Agreste M.D.   On: 02/05/2024 15:07    Assessment:   Marcus Simpson is a 78 y.o. male with multiple medical problems who had a recent admission for urosepsis, acute kidney failure and  obstructing stone.  He underwent stent placement.  Culture from the urine was mixed.  Blood cultures were positive for 2 sets of methicillin resistance staph epi dermatitis.  Transthoracic echocardiogram was negative.  There was concern he had endocarditis and he refused a TEE so PICC line was placed and he was discharged on a planned 6-week of daptomycin .  Previous admitted he had right knee swelling but tap did not show any evidence of infection and did show gouty crystals. Readmitted now with nausea vomiting fevers chills for the last 4 to 5 days found to have marked leukocytosis, positive urine sample but chest x-ray normal and CT abdomen just with bladder thickening. I suspect he has gram-negative urinary tract infection.  During his last admission he was treated with 3 days of ceftriaxone  but then changed to just 2 gram-positive coverage and  has been on daptomycin  at home.   Recommendations Sepsis- Continue vancomycin  and Zosyn. Follow-up urine and blood cultures.  Regarding the MRSE bactermia - FU BCX this admit. He does not have any endovascular devices that would make the coag negative staph bacteremia more concerning.  He has also completed 16 days of daptomycin  treatment. Can reconsider prolonged treatment at this point if clinically improving.  R knee prior effusion - much improved. Tap prior admit with gout found and cx neg. Only 1000 wbc.   Thank you very much for allowing me to participate in the care of this patient. Please call with questions.   Alm SQUIBB. Epifanio, MD

## 2024-02-19 NOTE — Consult Note (Signed)
 H&P Physician requesting consult: Marcus Simpson  Chief Complaint: Sepsis  History of Present Illness: 78 year old male with recent admission a couple weeks ago with left pyelonephritis secondary to an obstructing left ureteral stone status post left ureteral stent placement on 11/16.  Patient was discharged on daptomycin  for MRSE bacteremia.  He presented to the emergency department with fever, hypotension, elevated lactate, white blood cell count of 63.  CT scan was performed that showed left ureteral stent was in good position with no hydronephrosis.  There were no right ureteral stones.  Patient was transferred from [facility] to [facility] for critical care management.  This morning, he states he is feeling a little bit better.  Remains on pressors but seems to be clinically improving.  Past Medical History:  Diagnosis Date   Arthritis    Bradycardia    asymptomatic    Diabetes mellitus without complication (HCC)    History of kidney stones    Hypertension    Prostate cancer [facility])    Past Surgical History:  Procedure Laterality Date   COLONOSCOPY     CYSTOSCOPY WITH STENT PLACEMENT Right 12/28/2022   Procedure: CYSTOSCOPY WITH STENT PLACEMENT;  Surgeon: Carolee Sherwood [name], MD;  Location: [facility] OR;  Service: Urology;  Laterality: Right;   CYSTOSCOPY WITH STENT PLACEMENT Left 02/05/2024   Procedure: CYSTOSCOPY, WITH STENT INSERTION. URETHRAL DILATION;  Surgeon: Shane Steffan BROCKS, MD;  Location: [facility] ORS;  Service: Urology;  Laterality: Left;   EXTRACORPOREAL SHOCK WAVE LITHOTRIPSY Right 01/25/2023   Procedure: EXTRACORPOREAL SHOCK WAVE LITHOTRIPSY (ESWL);  Surgeon: [name], MD;  Location: [facility] ORS;  Service: Urology;  Laterality: Right;   FLEXIBLE SIGMOIDOSCOPY N/A 04/02/2015   Procedure: FLEXIBLE SIGMOIDOSCOPY;  Surgeon: Claudis RAYMOND Rivet, MD;  Location: [facility] ENDO SUITE;  Service: Endoscopy;  Laterality: N/A;  1:45 - moved to 1/11 @ 8:30 - Ann to notify   KIDNEY STONE SURGERY      RADIOACTIVE SEED IMPLANT N/A 12/29/2023   Procedure: INSERTION, RADIATION SOURCE, PROSTATE;  Surgeon: Sherrilee Belvie CROME, MD;  Location: [facility] ORS;  Service: Urology;  Laterality: N/A;   SPACE OAR INSTILLATION N/A 12/29/2023   Procedure: INJECTION, HYDROGEL SPACER;  Surgeon: Sherrilee Belvie CROME, MD;  Location: [facility] ORS;  Service: Urology;  Laterality: N/A;   TOTAL KNEE ARTHROPLASTY Left 05/02/2017   Procedure: LEFT TOTAL KNEE ARTHROPLASTY;  Surgeon: Melodi Lerner, MD;  Location: [facility] ORS;  Service: Orthopedics;  Laterality: Left;    Home Medications:  Medications Prior to Admission  Medication Sig Dispense Refill Last Dose/Taking   aspirin  81 MG chewable tablet Chew 81 mg by mouth daily.   Past Week   cyanocobalamin  (VITAMIN B12) 1000 MCG tablet Take 1,000 mcg by mouth daily.   02/18/2024 Morning   daptomycin  (CUBICIN ) IVPB Inject 700 mg into the vein daily. Indication:  MRSE bacteremia First Dose: Yes Last Day of Therapy:  03/20/24 Labs - Once weekly:  CBC/D, BMP, and CPK Labs - Once weekly: ESR and CRP Method of administration: IV Push Method of administration may be changed at the discretion of home infusion pharmacist based upon assessment of the patient and/or caregiver's ability to self-administer the medication ordered. 39 Units 0 02/18/2024 Evening   glipiZIDE (GLUCOTROL XL) 2.5 MG 24 hr tablet Take 2.5 mg by mouth daily.   02/18/2024 Morning   heparin  100 units/mL SOLN by Dialysis route daily. 1 dose of an unknown amount   02/18/2024 at  4:30 PM   ibuprofen (ADVIL) 200 MG tablet Take  400 mg by mouth every 6 (six) hours as needed for mild pain (pain score 1-3).   02/18/2024 at  4:30 PM   [Paused] indomethacin (INDOCIN) 25 MG capsule Take 25 mg by mouth 2 (two) times daily with a meal.   02/18/2024 Morning   meclizine  (ANTIVERT ) 25 MG tablet Take 25 mg by mouth 3 (three) times daily as needed for dizziness.   Unknown   metoprolol  succinate (TOPROL -XL) 25 MG 24 hr tablet Take 25 mg by mouth in  the morning and at bedtime.   02/18/2024 Morning   oxyCODONE -acetaminophen  (PERCOCET/ROXICET) 5-325 MG tablet Take 1 tablet by mouth every 6 (six) hours as needed for severe pain (pain score 7-10). 15 tablet 0 Past Week   rosuvastatin  (CRESTOR ) 5 MG tablet Take 5 mg by mouth every Saturday.   02/18/2024 Morning   tamsulosin  (FLOMAX ) 0.4 MG CAPS capsule Take 1 capsule (0.4 mg total) by mouth daily. 30 capsule 11 02/15/2024   traMADol  (ULTRAM ) 50 MG tablet Take 50 mg by mouth 3 (three) times daily as needed for moderate pain (pain score 4-6).   Past Week   Blood Glucose Monitoring Suppl DEVI 1 each by Does not apply route as directed. Dispense based on patient and insurance preference. Use up to four times daily as directed. (FOR ICD-10 E10.9, E11.9). 1 each 0    colchicine  0.6 MG tablet Take 1 tablet (0.6 mg total) by mouth 2 (two) times daily for 10 days. (Patient not taking: Reported on 02/18/2024) 20 tablet 0 Not Taking   Glucose Blood (BLOOD GLUCOSE TEST STRIPS) STRP 1 each by Does not apply route as directed. Dispense based on patient and insurance preference. Use up to four times daily as directed. (FOR ICD-10 E10.9, E11.9). 100 strip 0    Lancet Device MISC 1 each by Does not apply route as directed. Dispense based on patient and insurance preference. Use up to four times daily as directed. (FOR ICD-10 E10.9, E11.9). 1 each 0    Lancets MISC 1 each by Does not apply route as directed. Dispense based on patient and insurance preference. Use up to four times daily as directed. (FOR ICD-10 E10.9, E11.9). 100 each 0    [Paused] nitrofurantoin , macrocrystal-monohydrate, (MACROBID ) 100 MG capsule Take 1 capsule (100 mg total) by mouth every 12 (twelve) hours. (Patient not taking: Reported on 02/05/2024) 14 capsule 0 Not Taking   ONETOUCH ULTRA TEST test strip 1 each by Other route daily. 100 each 0    Allergies:  Allergies  Allergen Reactions   Dilaudid [Hydromorphone Hcl] Anaphylaxis and Other (See  Comments)    Stops heart    No family history on file. Social History:  reports that he has quit smoking. His smoking use included cigarettes. He has never used smokeless tobacco. He reports that he does not drink alcohol and does not use drugs.  ROS: A complete review of systems was performed.  All systems are negative except for pertinent findings as noted. ROS   Physical Exam:  Vital signs in last 24 hours: Temp:  [97.3 F (36.3 C)-100.4 F (38 C)] 97.7 F (36.5 C) (11/30 0800) Pulse Rate:  [63-192] 75 (11/30 0800) Resp:  [17-32] 25 (11/30 0800) BP: (71-143)/(41-71) 126/71 (11/30 0800) SpO2:  [91 %-100 %] 96 % (11/30 0800) Weight:  [106.6 kg-108.3 kg] 108.3 kg (11/30 0500) General:  Alert and oriented, No acute distress HEENT: Normocephalic, atraumatic Neck: No JVD or lymphadenopathy Cardiovascular: Regular rate and rhythm Lungs: Regular rate and effort  Abdomen: Soft, nontender, nondistended, no abdominal masses Back: No CVA tenderness Extremities: No edema Neurologic: Grossly intact  Laboratory Data:  Results for orders placed or performed during the hospital encounter of 02/18/24 (from the past 24 hours)  Resp panel by RT-PCR (RSV, Flu A&B, Covid) Anterior Nasal Swab     Status: None   Collection Time: 02/18/24  6:25 PM   Specimen: Anterior Nasal Swab  Result Value Ref Range   SARS Coronavirus 2 by RT PCR NEGATIVE NEGATIVE   Influenza A by PCR NEGATIVE NEGATIVE   Influenza B by PCR NEGATIVE NEGATIVE   Resp Syncytial Virus by PCR NEGATIVE NEGATIVE  Lactic acid, plasma     Status: Abnormal   Collection Time: 02/18/24  6:25 PM  Result Value Ref Range   Lactic Acid, Venous 5.6 (HH) 0.5 - 1.9 mmol/L  Comprehensive metabolic panel     Status: Abnormal   Collection Time: 02/18/24  6:25 PM  Result Value Ref Range   Sodium 138 135 - 145 mmol/L   Potassium 3.7 3.5 - 5.1 mmol/L   Chloride 106 98 - 111 mmol/L   CO2 15 (L) 22 - 32 mmol/L   Glucose, Bld 122 (H) 70 - 99  mg/dL   BUN 33 (H) 8 - 23 mg/dL   Creatinine, Ser 7.61 (H) 0.61 - 1.24 mg/dL   Calcium  7.3 (L) 8.9 - 10.3 mg/dL   Total Protein 5.9 (L) 6.5 - 8.1 g/dL   Albumin 3.0 (L) 3.5 - 5.0 g/dL   AST 32 15 - 41 U/L   ALT 29 0 - 44 U/L   Alkaline Phosphatase 62 38 - 126 U/L   Total Bilirubin 1.3 (H) 0.0 - 1.2 mg/dL   GFR, Estimated 27 (L) >60 mL/min   Anion gap 18 (H) 5 - 15  CBC with Differential     Status: Abnormal   Collection Time: 02/18/24  6:25 PM  Result Value Ref Range   WBC 63.9 (HH) 4.0 - 10.5 K/uL   RBC 3.92 (L) 4.22 - 5.81 MIL/uL   Hemoglobin 11.9 (L) 13.0 - 17.0 g/dL   HCT 63.4 (L) 60.9 - 47.9 %   MCV 93.1 80.0 - 100.0 fL   MCH 30.4 26.0 - 34.0 pg   MCHC 32.6 30.0 - 36.0 g/dL   RDW 86.6 88.4 - 84.4 %   Platelets 422 (H) 150 - 400 K/uL   nRBC 0.0 0.0 - 0.2 %   Neutrophils Relative % 81 %   Neutro Abs 51.5 (H) 1.7 - 7.7 K/uL   Lymphocytes Relative 3 %   Lymphs Abs 1.8 0.7 - 4.0 K/uL   Monocytes Relative 6 %   Monocytes Absolute 3.9 (H) 0.1 - 1.0 K/uL   Eosinophils Relative 0 %   Eosinophils Absolute 0.0 0.0 - 0.5 K/uL   Basophils Relative 0 %   Basophils Absolute 0.2 (H) 0.0 - 0.1 K/uL   WBC Morphology MORPHOLOGY UNREMARKABLE    RBC Morphology MORPHOLOGY UNREMARKABLE    Immature Granulocytes 10 %   Abs Immature Granulocytes 6.58 (H) 0.00 - 0.07 K/uL  Protime-INR     Status: Abnormal   Collection Time: 02/18/24  6:25 PM  Result Value Ref Range   Prothrombin Time 16.4 (H) 11.4 - 15.2 seconds   INR 1.3 (H) 0.8 - 1.2  Blood Culture (routine x 2)     Status: None (Preliminary result)   Collection Time: 02/18/24  6:25 PM   Specimen: Vein; Blood  Result Value Ref Range  Specimen Description BLOOD LEFT ANTECUBITAL    Special Requests      BOTTLES DRAWN AEROBIC AND ANAEROBIC Blood Culture adequate volume Performed at [facility], [address] [place], [place] [address]    Culture PENDING    Report Status PENDING   Blood Culture (routine x 2)     Status: None  (Preliminary result)   Collection Time: 02/18/24  6:30 PM   Specimen: Vein; Blood  Result Value Ref Range   Specimen Description BLOOD BLOOD RIGHT ARM    Special Requests      BOTTLES DRAWN AEROBIC AND ANAEROBIC Blood Culture adequate volume Performed at [facility], [address], [place], [place] [address]    Culture PENDING    Report Status PENDING   Lactic acid, plasma     Status: Abnormal   Collection Time: 02/18/24  8:41 PM  Result Value Ref Range   Lactic Acid, Venous 7.2 (HH) 0.5 - 1.9 mmol/L  MRSA Next Gen by PCR, Nasal     Status: None   Collection Time: 02/18/24  9:57 PM   Specimen: Nasal Mucosa; Nasal Swab  Result Value Ref Range   MRSA by PCR Next Gen NOT DETECTED NOT DETECTED  Lactic acid, plasma     Status: Abnormal   Collection Time: 02/18/24 10:19 PM  Result Value Ref Range   Lactic Acid, Venous 5.0 (HH) 0.5 - 1.9 mmol/L  Urinalysis, w/ Reflex to Culture (Infection Suspected) -Urine, Catheterized     Status: Abnormal   Collection Time: 02/18/24 10:45 PM  Result Value Ref Range   Specimen Source URINE, CATHETERIZED    Color, Urine YELLOW YELLOW   APPearance CLOUDY (A) CLEAR   Specific Gravity, Urine 1.020 1.005 - 1.030   pH 6.0 5.0 - 8.0   Glucose, UA NEGATIVE NEGATIVE mg/dL   Hgb urine dipstick LARGE (A) NEGATIVE   Bilirubin Urine NEGATIVE NEGATIVE   Ketones, ur NEGATIVE NEGATIVE mg/dL   Protein, ur >699 (A) NEGATIVE mg/dL   Nitrite POSITIVE (A) NEGATIVE   Leukocytes,Ua MODERATE (A) NEGATIVE   Squamous Epithelial / HPF 0-5 0 - 5 /HPF   WBC, UA >50 0 - 5 WBC/hpf   RBC / HPF 21-50 0 - 5 RBC/hpf   Bacteria, UA MANY (A) NONE SEEN   WBC Clumps PRESENT    Mucus PRESENT   Brain natriuretic peptide     Status: Abnormal   Collection Time: 02/18/24 11:05 PM  Result Value Ref Range   B Natriuretic Peptide 312.3 (H) 0.0 - 100.0 pg/mL  Basic metabolic panel     Status: Abnormal   Collection Time: 02/18/24 11:05 PM  Result Value Ref Range   Sodium 131 (L) 135 -  145 mmol/L   Potassium 4.6 3.5 - 5.1 mmol/L   Chloride 97 (L) 98 - 111 mmol/L   CO2 21 (L) 22 - 32 mmol/L   Glucose, Bld 119 (H) 70 - 99 mg/dL   BUN 37 (H) 8 - 23 mg/dL   Creatinine, Ser 7.16 (H) 0.61 - 1.24 mg/dL   Calcium  7.9 (L) 8.9 - 10.3 mg/dL   GFR, Estimated 22 (L) >60 mL/min   Anion gap 13 5 - 15  Magnesium      Status: Abnormal   Collection Time: 02/18/24 11:05 PM  Result Value Ref Range   Magnesium  1.2 (L) 1.7 - 2.4 mg/dL  Phosphorus     Status: None   Collection Time: 02/18/24 11:05 PM  Result Value Ref Range   Phosphorus 3.2 2.5 -  4.6 mg/dL  POCT I-Stat EG7     Status: Abnormal   Collection Time: 02/18/24 11:10 PM  Result Value Ref Range   pH, Ven 7.386 7.25 - 7.43   pCO2, Ven 40.0 (L) 44 - 60 mmHg   pO2, Ven 36 32 - 45 mmHg   Bicarbonate 23.7 20.0 - 28.0 mmol/L   TCO2 25 22 - 32 mmol/L   O2 Saturation 64 %   Acid-base deficit 1.0 0.0 - 2.0 mmol/L   Sodium 132 (L) 135 - 145 mmol/L   Potassium 4.9 3.5 - 5.1 mmol/L   Calcium , Ion 1.09 (L) 1.15 - 1.40 mmol/L   HCT 30.0 (L) 39.0 - 52.0 %   Hemoglobin 10.2 (L) 13.0 - 17.0 g/dL   Patient temperature 61.8 C    Sample type VENOUS    Comment VALUES VERIFIED BY LAB OR ISTAT   Glucose, capillary     Status: None   Collection Time: 02/18/24 11:54 PM  Result Value Ref Range   Glucose-Capillary 92 70 - 99 mg/dL  Glucose, capillary     Status: Abnormal   Collection Time: 02/19/24  3:18 AM  Result Value Ref Range   Glucose-Capillary 127 (H) 70 - 99 mg/dL  Basic metabolic panel     Status: Abnormal   Collection Time: 02/19/24  3:50 AM  Result Value Ref Range   Sodium 132 (L) 135 - 145 mmol/L   Potassium 5.0 3.5 - 5.1 mmol/L   Chloride 98 98 - 111 mmol/L   CO2 21 (L) 22 - 32 mmol/L   Glucose, Bld 127 (H) 70 - 99 mg/dL   BUN 38 (H) 8 - 23 mg/dL   Creatinine, Ser 7.22 (H) 0.61 - 1.24 mg/dL   Calcium  8.4 (L) 8.9 - 10.3 mg/dL   GFR, Estimated 22 (L) >60 mL/min   Anion gap 13 5 - 15  CBC     Status: Abnormal   Collection  Time: 02/19/24  3:50 AM  Result Value Ref Range   WBC 49.8 (H) 4.0 - 10.5 K/uL   RBC 3.21 (L) 4.22 - 5.81 MIL/uL   Hemoglobin 9.8 (L) 13.0 - 17.0 g/dL   HCT 70.2 (L) 60.9 - 47.9 %   MCV 92.5 80.0 - 100.0 fL   MCH 30.5 26.0 - 34.0 pg   MCHC 33.0 30.0 - 36.0 g/dL   RDW 86.6 88.4 - 84.4 %   Platelets 350 150 - 400 K/uL   nRBC 0.0 0.0 - 0.2 %  Magnesium      Status: None   Collection Time: 02/19/24  3:50 AM  Result Value Ref Range   Magnesium  2.3 1.7 - 2.4 mg/dL  Phosphorus     Status: None   Collection Time: 02/19/24  3:50 AM  Result Value Ref Range   Phosphorus 4.6 2.5 - 4.6 mg/dL  Lactic acid, plasma     Status: None   Collection Time: 02/19/24  3:50 AM  Result Value Ref Range   Lactic Acid, Venous 1.7 0.5 - 1.9 mmol/L  Glucose, capillary     Status: Abnormal   Collection Time: 02/19/24  7:59 AM  Result Value Ref Range   Glucose-Capillary 137 (H) 70 - 99 mg/dL   Recent Results (from the past 240 hours)  Body fluid culture w Gram Stain     Status: None   Collection Time: 02/10/24  5:11 PM   Specimen: Synovium; Synovial Fluid  Result Value Ref Range Status   Specimen Description   Final  SYNOVIAL Performed at [facility], [address] [place], [place] [address]    Special Requests   Final     R KNEE Performed at [facility], [address] [place], [place] [address]    Gram Stain   Final    ABUNDANT WBC PRESENT, PREDOMINANTLY PMN NO ORGANISMS SEEN    Culture   Final    NO GROWTH 3 DAYS Performed at [facility], [address], [place], [place] [address]    Report Status 02/14/2024 FINAL  Final  Anaerobic culture w Gram Stain     Status: None   Collection Time: 02/10/24  5:11 PM   Specimen: Synovium; Synovial Fluid  Result Value Ref Range Status   Specimen Description   Final    SYNOVIAL Performed at [facility], [address], [place], [place] [address]    Special Requests   Final    R  KNEE Performed at [facility], [address] [place], [place] [address]    Gram Stain   Final    FEW WBC PRESENT, PREDOMINANTLY PMN NO ORGANISMS SEEN    Culture   Final    NO ANAEROBES ISOLATED Performed at [facility], [address] [place], [place] [address]    Report Status 02/15/2024 FINAL  Final  Resp panel by RT-PCR (RSV, Flu A&B, Covid) Anterior Nasal Swab     Status: None   Collection Time: 02/18/24  6:25 PM   Specimen: Anterior Nasal Swab  Result Value Ref Range Status   SARS Coronavirus 2 by RT PCR NEGATIVE NEGATIVE Final    Comment: (NOTE) SARS-CoV-2 target nucleic acids are NOT DETECTED.  The SARS-CoV-2 RNA is generally detectable in upper respiratory specimens during the acute phase of infection. The lowest concentration of SARS-CoV-2 viral copies this assay can detect is 138 copies/mL. A negative result does not preclude SARS-Cov-2 infection and should not be used as the sole basis for treatment or other patient management decisions. A negative result may occur with  improper specimen collection/handling, submission of specimen other than nasopharyngeal swab, presence of viral mutation(s) within the areas targeted by this assay, and inadequate number of viral copies(<138 copies/mL). A negative result must be combined with clinical observations, patient history, and epidemiological information. The expected result is Negative.  Fact Sheet for Patients:  bloggercourse.com  Fact Sheet for Healthcare Providers:  seriousbroker.it  This test is no t yet approved or cleared by the [place]  FDA and  has been authorized for detection and/or diagnosis of SARS-CoV-2 by FDA under an Emergency Use Authorization (EUA). This EUA will remain  in effect (meaning this test can be used) for the duration of the COVID-19 declaration under Section 564(b)(1) of the Act, 21 U.S.C.section  360bbb-3(b)(1), unless the authorization is terminated  or revoked sooner.       Influenza A by PCR NEGATIVE NEGATIVE Final   Influenza B by PCR NEGATIVE NEGATIVE Final    Comment: (NOTE) The Xpert Xpress SARS-CoV-2/FLU/RSV plus assay is intended as an aid in the diagnosis of influenza from Nasopharyngeal swab specimens and should not be used as a sole basis for treatment. Nasal washings and aspirates are unacceptable for Xpert Xpress SARS-CoV-2/FLU/RSV testing.  Fact Sheet for Patients: bloggercourse.com  Fact Sheet for Healthcare Providers: seriousbroker.it  This test is not yet approved or cleared by the [place]  FDA and has been authorized for detection and/or diagnosis of SARS-CoV-2 by FDA under  an Emergency Use Authorization (EUA). This EUA will remain in effect (meaning this test can be used) for the duration of the COVID-19 declaration under Section 564(b)(1) of the Act, 21 U.S.C. section 360bbb-3(b)(1), unless the authorization is terminated or revoked.     Resp Syncytial Virus by PCR NEGATIVE NEGATIVE Final    Comment: (NOTE) Fact Sheet for Patients: bloggercourse.com  Fact Sheet for Healthcare Providers: seriousbroker.it  This test is not yet approved or cleared by the [place]  FDA and has been authorized for detection and/or diagnosis of SARS-CoV-2 by FDA under an Emergency Use Authorization (EUA). This EUA will remain in effect (meaning this test can be used) for the duration of the COVID-19 declaration under Section 564(b)(1) of the Act, 21 U.S.C. section 360bbb-3(b)(1), unless the authorization is terminated or revoked.  Performed at [facility], [address], [place], [place] [address]   Blood Culture (routine x 2)     Status: None (Preliminary result)   Collection Time: 02/18/24  6:25 PM   Specimen: Vein; Blood  Result Value Ref Range Status    Specimen Description BLOOD LEFT ANTECUBITAL  Final   Special Requests   Final    BOTTLES DRAWN AEROBIC AND ANAEROBIC Blood Culture adequate volume Performed at [facility], [address] [place], [place] [address]    Culture PENDING  Incomplete   Report Status PENDING  Incomplete  Blood Culture (routine x 2)     Status: None (Preliminary result)   Collection Time: 02/18/24  6:30 PM   Specimen: Vein; Blood  Result Value Ref Range Status   Specimen Description BLOOD BLOOD RIGHT ARM  Final   Special Requests   Final    BOTTLES DRAWN AEROBIC AND ANAEROBIC Blood Culture adequate volume Performed at [facility], [address] [place], [place] [address]    Culture PENDING  Incomplete   Report Status PENDING  Incomplete  MRSA Next Gen by PCR, Nasal     Status: None   Collection Time: 02/18/24  9:57 PM   Specimen: Nasal Mucosa; Nasal Swab  Result Value Ref Range Status   MRSA by PCR Next Gen NOT DETECTED NOT DETECTED Final    Comment: (NOTE) The GeneXpert MRSA Assay (FDA approved for NASAL specimens only), is one component of a comprehensive MRSA colonization surveillance program. It is not intended to diagnose MRSA infection nor to guide or monitor treatment for MRSA infections. Test performance is not FDA approved in patients less than 10 years old. Performed at [facility], [address] [place], [place] [address]    Creatinine: Recent Labs    02/13/24 0244 02/18/24 1825 02/18/24 2305 02/19/24 0350  CREATININE 1.80* 2.38* 2.83* 2.77*    Impression/Assessment:  Sepsis secondary to UTI Left ureteral calculus status post ureteral stent  Plan:  Stent is in good position.  Agree with Foley catheter for now until he clinically improves and then can undergo voiding trial.  Do not recommend any surgical intervention.  He will need definitive management of the stone after a week or so of culture specific antibiotics and full recovery from this septic event.  Sherwood JONETTA Edison, III 02/19/2024, 8:24 AM

## 2024-02-19 NOTE — Progress Notes (Signed)
  Echocardiogram 2D Echocardiogram has been performed.  [name] 02/19/2024, 8:46 AM

## 2024-02-20 DIAGNOSIS — A419 Sepsis, unspecified organism: Secondary | ICD-10-CM | POA: Diagnosis not present

## 2024-02-20 DIAGNOSIS — N179 Acute kidney failure, unspecified: Secondary | ICD-10-CM

## 2024-02-20 DIAGNOSIS — D72829 Elevated white blood cell count, unspecified: Secondary | ICD-10-CM | POA: Diagnosis not present

## 2024-02-20 DIAGNOSIS — R7881 Bacteremia: Secondary | ICD-10-CM

## 2024-02-20 DIAGNOSIS — N390 Urinary tract infection, site not specified: Secondary | ICD-10-CM

## 2024-02-20 DIAGNOSIS — R6521 Severe sepsis with septic shock: Secondary | ICD-10-CM | POA: Diagnosis not present

## 2024-02-20 DIAGNOSIS — B965 Pseudomonas (aeruginosa) (mallei) (pseudomallei) as the cause of diseases classified elsewhere: Secondary | ICD-10-CM | POA: Diagnosis not present

## 2024-02-20 DIAGNOSIS — N184 Chronic kidney disease, stage 4 (severe): Secondary | ICD-10-CM

## 2024-02-20 LAB — GLUCOSE, CAPILLARY
Glucose-Capillary: 107 mg/dL — ABNORMAL HIGH (ref 70–99)
Glucose-Capillary: 122 mg/dL — ABNORMAL HIGH (ref 70–99)
Glucose-Capillary: 78 mg/dL (ref 70–99)
Glucose-Capillary: 80 mg/dL (ref 70–99)
Glucose-Capillary: 83 mg/dL (ref 70–99)
Glucose-Capillary: 87 mg/dL (ref 70–99)
Glucose-Capillary: 90 mg/dL (ref 70–99)

## 2024-02-20 LAB — BASIC METABOLIC PANEL WITH GFR
Anion gap: 11 (ref 5–15)
BUN: 43 mg/dL — ABNORMAL HIGH (ref 8–23)
CO2: 20 mmol/L — ABNORMAL LOW (ref 22–32)
Calcium: 7.9 mg/dL — ABNORMAL LOW (ref 8.9–10.3)
Chloride: 103 mmol/L (ref 98–111)
Creatinine, Ser: 2.85 mg/dL — ABNORMAL HIGH (ref 0.61–1.24)
GFR, Estimated: 22 mL/min — ABNORMAL LOW (ref >60)
Glucose, Bld: 120 mg/dL — ABNORMAL HIGH (ref 70–99)
Potassium: 4.1 mmol/L (ref 3.5–5.1)
Sodium: 134 mmol/L — ABNORMAL LOW (ref 135–145)

## 2024-02-20 LAB — CBC
HCT: 27.7 % — ABNORMAL LOW (ref 39.0–52.0)
Hemoglobin: 9.1 g/dL — ABNORMAL LOW (ref 13.0–17.0)
MCH: 30.3 pg (ref 26.0–34.0)
MCHC: 32.9 g/dL (ref 30.0–36.0)
MCV: 92.3 fL (ref 80.0–100.0)
Platelets: 264 K/uL (ref 150–400)
RBC: 3 MIL/uL — ABNORMAL LOW (ref 4.22–5.81)
RDW: 13.6 % (ref 11.5–15.5)
WBC: 23.5 K/uL — ABNORMAL HIGH (ref 4.0–10.5)
nRBC: 0 % (ref 0.0–0.2)

## 2024-02-20 LAB — CK: Total CK: 103 U/L (ref 49–397)

## 2024-02-20 LAB — MAGNESIUM: Magnesium: 2.1 mg/dL (ref 1.7–2.4)

## 2024-02-20 LAB — PHOSPHORUS: Phosphorus: 4.2 mg/dL (ref 2.5–4.6)

## 2024-02-20 MED ORDER — ROSUVASTATIN CALCIUM 5 MG PO TABS: 5.0000 mg | ORAL_TABLET | ORAL | Status: DC

## 2024-02-20 MED ORDER — DAPTOMYCIN-SODIUM CHLORIDE 700-0.9 MG/100ML-% IV SOLN
8.0000 mg/kg | INTRAVENOUS | Status: DC
  Administered 2024-02-20: 700 mg via INTRAVENOUS
  Filled 2024-02-20 (×2): qty 100

## 2024-02-20 MED ORDER — METOPROLOL SUCCINATE ER 25 MG PO TB24
25.0000 mg | ORAL_TABLET | Freq: Every day | ORAL | Status: DC
  Administered 2024-02-20 – 2024-02-23 (×4): 25 mg via ORAL
  Filled 2024-02-20 (×4): qty 1

## 2024-02-20 MED ORDER — SODIUM CHLORIDE 0.9 % IV SOLN
2.0000 g | INTRAVENOUS | Status: DC
  Administered 2024-02-20 – 2024-02-21 (×2): 2 g via INTRAVENOUS
  Filled 2024-02-20 (×2): qty 12.5

## 2024-02-20 NOTE — Progress Notes (Signed)
 PHARMACY - PHYSICIAN COMMUNICATION CRITICAL VALUE ALERT - BLOOD CULTURE IDENTIFICATION (BCID)  Marcus Simpson is an 78 y.o. male who presented to [facility] on 02/18/2024 with a chief complaint of sepsis  Name of physician (or Provider) Contacted: Dr. Fate Current antibiotics: Vancomycin , Zosyn  Changes to prescribed antibiotics recommended:  No changes for now  Results for orders placed or performed during the hospital encounter of 02/18/24  Blood Culture ID Panel (Reflexed) (Collected: 02/18/2024  6:30 PM)  Result Value Ref Range   Enterococcus faecalis NOT DETECTED NOT DETECTED   Enterococcus Faecium NOT DETECTED NOT DETECTED   Listeria monocytogenes NOT DETECTED NOT DETECTED   Staphylococcus species NOT DETECTED NOT DETECTED   Staphylococcus aureus (BCID) NOT DETECTED NOT DETECTED   Staphylococcus epidermidis NOT DETECTED NOT DETECTED   Staphylococcus lugdunensis NOT DETECTED NOT DETECTED   Streptococcus species NOT DETECTED NOT DETECTED   Streptococcus agalactiae NOT DETECTED NOT DETECTED   Streptococcus pneumoniae NOT DETECTED NOT DETECTED   Streptococcus pyogenes NOT DETECTED NOT DETECTED   A.calcoaceticus-baumannii NOT DETECTED NOT DETECTED   Bacteroides fragilis NOT DETECTED NOT DETECTED   Enterobacterales NOT DETECTED NOT DETECTED   Enterobacter cloacae complex NOT DETECTED NOT DETECTED   Escherichia coli NOT DETECTED NOT DETECTED   Klebsiella aerogenes NOT DETECTED NOT DETECTED   Klebsiella oxytoca NOT DETECTED NOT DETECTED   Klebsiella pneumoniae NOT DETECTED NOT DETECTED   Proteus species NOT DETECTED NOT DETECTED   Salmonella species NOT DETECTED NOT DETECTED   Serratia marcescens NOT DETECTED NOT DETECTED   Haemophilus influenzae NOT DETECTED NOT DETECTED   Neisseria meningitidis NOT DETECTED NOT DETECTED   Pseudomonas aeruginosa DETECTED (A) NOT DETECTED   Stenotrophomonas maltophilia NOT DETECTED NOT DETECTED   Candida albicans NOT DETECTED NOT DETECTED    Candida auris NOT DETECTED NOT DETECTED   Candida glabrata NOT DETECTED NOT DETECTED   Candida krusei NOT DETECTED NOT DETECTED   Candida parapsilosis NOT DETECTED NOT DETECTED   Candida tropicalis NOT DETECTED NOT DETECTED   Cryptococcus neoformans/gattii NOT DETECTED NOT DETECTED   CTX-M ESBL NOT DETECTED NOT DETECTED   Carbapenem resistance IMP NOT DETECTED NOT DETECTED   Carbapenem resistance KPC NOT DETECTED NOT DETECTED   Carbapenem resistance NDM NOT DETECTED NOT DETECTED   Carbapenem resistance VIM NOT DETECTED NOT DETECTED    Clair Agent 02/20/2024  2:35 AM

## 2024-02-20 NOTE — Progress Notes (Signed)
 Consultation Progress Note   Patient: Marcus Simpson FMW:[id number] DOB: 1945-03-25 DOA: 02/18/2024 DOS: the patient was seen and examined on 02/20/2024 Primary service: DibiaLandon BRAVO, MD  Brief hospital course: 78 year old male with past medical history significant for HTN, DM2, hx nephrolithiasis, asymptomatic bradycardia, prostate cancer who presented to [facility] ED 11/29 septic; with c/o weakness, febrile to 102.2, hypotensive 70/50 with recent 2 week hospitalization for MRSE bacteremia in setting of obstructing stone s/p ureteral stent placement. In the ED he was given 3.5L LR with improvement in BP, cultures drawn, and started empirically on Vanc/Zosyn. Patient was recently admitted November 16-26 for sepsis and discharged on Zyvox  and then placed on 6-week course of daptomycin  via PICC linefor presumed IE .  Patient was admitted to the ICU for management of septic shock patient was placed on pressors broad-spectrum antibiotics with vancomycin  and Zosyn.  Blood cultures positive for Pseudomonas aeruginosa urine cultures growing negative rods.  CT abdomen and pelvis shows a stable stent with no hydronephrosis, bladder thickening.  X-ray negative for any acute pathology.  His WBC count has improved from 63,000 during admission to 23.5 today.  He remains afebrile.  Patient is followed by infectious disease and urology.    Assessment and Plan: Septic shock, resolved Sepsis secondary to UTI Ureteral stone status post ureteral stent Pseudomonas aeruginosa bacteremia  -Recent discharge on 11/24 where he was admitted for sepsis MRSA bacteremia, pyelonephritis obstructing stone, status post stent with treatment with 6 weeks Dapto. Cultures were positive for Pseudomonas aeruginosa urine cultures gram-negative rods. -Patient on cefepime  and apramycin ID and urology following -Urology continue antibiotics, Foley, stent in good position. -No further surgical intervention planned - AKI on CKD 4 Post  Renal,  S/p L ureteral stent 2/2 obstructing stone  -Worsening serum creatinine in setting of ureteral stents and coronary to obstructing stone. -Monitor trend.  May need nephrology evaluation if kidney functions remains downtrended  Hx prostate ca  HypoNa -improving Hypomag, improved  -keep foley -follow renal indices, lytes, uop -as above, stent in good position no add'l uro intervention req at this time    Hx HTN, HLD -In ICU, restarted metoprolol  and Crestor  today  Anemia  -follow CBC    Gout R knee  -effusion tapped last admission, fortunately not c/w septic arthritis.  - Compliant with home colchicine  -Holding gout medications in the setting of AKI/CKD   DM2 -SSI        TRH will continue to follow the patient.  Subjective: Patient seen at bedside this morning, no acute overnight events.  Denies fevers, chills, nausea, vomiting.  Physical Exam: Vitals:   02/19/24 2300 02/20/24 0400 02/20/24 0439 02/20/24 0717  BP: (!) 132/53 128/61  (!) 153/77  Pulse: 79 83  88  Resp: 20 20  18   Temp: 97.9 F (36.6 C) 98.6 F (37 C)  98.6 F (37 C)  TempSrc: Oral Oral  Oral  SpO2: 93% 94%  92%  Weight:   110.9 kg   Height:      General  No acute Distress Eyes: PERRL, lids and conjunctivae normal ENMT: Mucous membranes are moist.   Neck: normal, supple, no masses, no thyromegaly Respiratory: clear to auscultation bilaterally, no wheezing, no crackles. Normal respiratory effort. No accessory muscle use.  Cardiovascular: Regular rate and rhythm, no murmurs / rubs / gallops Abdomen: Soft, nontender nondistended. Musculoskeletal: no clubbing / cyanosis. No joint deformity upper and lower extremities.  Skin: no rashes, lesions, ulcers. No induration Neurologic: Facial  asymmetry, moving extremity spontaneously, speech fluent. Psychiatric: Normal judgment and insight. Alert and oriented x 3. Normal mood.   Data Reviewed:         Component Value Date/Time   SDES URINE,  RANDOM 02/18/2024 2245   SPECREQUEST NONE Reflexed from [id number] 02/18/2024 2245   CULT (A) 02/18/2024 2245    60,000 COLONIES/mL PSEUDOMONAS AERUGINOSA SUSCEPTIBILITIES TO FOLLOW Performed at [facility], [address] [place], [place] [address]    REPTSTATUS PENDING 02/18/2024 2245    Susceptibility data from last 90 days. Collected Specimen Info Organism Ciprofloxacin  Clindamycin Erythromycin Gentamicin  Susc lslt Inducible Clindamycin Oxacillin Rifampin TELAVANCIN TETRACYCLINE Trimethoprim/Sulfa  02/05/24 Blood from BLOOD Staphylococcus epidermidis  R  R  R  S  S  R  S  S  S  R  02/05/24 Blood from BLOOD Staphylococcus epidermidis  R  R  R  S  S  R  S  S  S  R    CBC    Component Value Date/Time   WBC 23.5 (H) 02/20/2024 0214   RBC 3.00 (L) 02/20/2024 0214   HGB 9.1 (L) 02/20/2024 0214   HCT 27.7 (L) 02/20/2024 0214   PLT 264 02/20/2024 0214   MCV 92.3 02/20/2024 0214   MCH 30.3 02/20/2024 0214   MCHC 32.9 02/20/2024 0214   RDW 13.6 02/20/2024 0214   LYMPHSABS 1.8 02/18/2024 1825   MONOABS 3.9 (H) 02/18/2024 1825   EOSABS 0.0 02/18/2024 1825   BASOSABS 0.2 (H) 02/18/2024 1825    CMP     Component Value Date/Time   NA 134 (L) 02/20/2024 0214   K 4.1 02/20/2024 0214   CL 103 02/20/2024 0214   CO2 20 (L) 02/20/2024 0214   GLUCOSE 120 (H) 02/20/2024 0214   BUN 43 (H) 02/20/2024 0214   BUN 20 06/29/2022 1354   CREATININE 2.85 (H) 02/20/2024 0214   CALCIUM  7.9 (L) 02/20/2024 0214   PROT 5.9 (L) 02/18/2024 1825   ALBUMIN 3.0 (L) 02/18/2024 1825   AST 32 02/18/2024 1825   ALT 29 02/18/2024 1825   ALKPHOS 62 02/18/2024 1825   BILITOT 1.3 (H) 02/18/2024 1825   EGFR 58 (L) 06/29/2022 1354   GFRNONAA 22 (L) 02/20/2024 0214     Time spent: 36 minutes.  Author: Landon FORBES Baller, MD 02/20/2024 10:58 AM  For on call review [email or web address].

## 2024-02-20 NOTE — Plan of Care (Signed)
  Problem: Education: Goal: Knowledge of General Education information will improve Description: Including pain rating scale, medication(s)/side effects and non-pharmacologic comfort measures Outcome: Progressing   Problem: Health Behavior/Discharge Planning: Goal: Ability to manage health-related needs will improve Outcome: Progressing   Problem: Clinical Measurements: Goal: Will remain free from infection Outcome: Progressing   Problem: Nutrition: Goal: Adequate nutrition will be maintained Outcome: Progressing   Problem: Coping: Goal: Level of anxiety will decrease Outcome: Progressing   Problem: Elimination: Goal: Will not experience complications related to urinary retention Outcome: Progressing   Problem: Pain Managment: Goal: General experience of comfort will improve and/or be controlled Outcome: Progressing   Problem: Fluid Volume: Goal: Ability to maintain a balanced intake and output will improve Outcome: Progressing   Problem: Health Behavior/Discharge Planning: Goal: Ability to identify and utilize available resources and services will improve Outcome: Progressing

## 2024-02-20 NOTE — TOC Initial Note (Signed)
 Transition of Care [facility]) - Initial/Assessment Note    Patient Details  Name: Marcus Simpson MRN: [id number] Date of Birth: Aug 01, 1945  [facility]) CM/SW Contact:    Roxie KANDICE Stain, RN Phone Number: 02/20/2024, 3:07 PM  Clinical Narrative:                  Patient was recently admitted November 16-26 for sepsis and discharged on Zyvox  and then placed on 6-week course of daptomycin  via PICC line.  Home health through [facility] and Iv antibiotics from [facility].  ICM (Inpatient Care Management) will continue to follow for needs.  Expected Discharge Plan: Home w Home Health Services Barriers to Discharge: Continued Medical Work up   Patient Goals and CMS Choice Patient states their goals for this hospitalization and ongoing recovery are:: return home          Expected Discharge Plan and Services       Living arrangements for the past 2 months: Single Family Home                                      Prior Living Arrangements/Services Living arrangements for the past 2 months: Single Family Home Lives with:: Spouse                   Activities of Daily Living   ADL Screening (condition at time of admission) Independently performs ADLs?: Yes (appropriate for developmental age) Is the patient deaf or have difficulty hearing?: No Does the patient have difficulty seeing, even when wearing glasses/contacts?: No Does the patient have difficulty concentrating, remembering, or making decisions?: No  Permission Sought/Granted                  Emotional Assessment              Admission diagnosis:  Septic shock (HCC) [A41.9, R65.21] Acute renal failure, unspecified acute renal failure type [N17.9] Patient Active Problem List   Diagnosis Date Noted   Septic shock (HCC) 02/18/2024   Acute gout 02/11/2024   Effusion of right knee joint 02/10/2024   Bacteremia due to methicillin resistant Staphylococcus epidermidis 02/08/2024   Acute  pyelonephritis 02/08/2024   Left ureteral calculus 02/08/2024   Anemia of chronic disease 02/08/2024   Well controlled type 2 diabetes mellitus (HCC) 02/08/2024   Bilateral lower extremity edema 02/08/2024   Severe sepsis (HCC) 02/08/2024   Acute kidney injury due to urinary tract obstruction 02/05/2024   Sepsis secondary to UTI (HCC) 12/29/2022   Acute encephalopathy 12/29/2022   Hydronephrosis with obstructing calculus 12/29/2022   Ureteral stone with hydronephrosis 12/28/2022   OA (osteoarthritis) of knee 05/02/2017   Diarrhea 03/10/2015   Prostate cancer (HCC) 03/10/2015   Hyperlipidemia 11/13/2008   Essential hypertension 11/13/2008   SYNCOPE AND COLLAPSE 11/13/2008   PCP:  [name], MD Pharmacy:   [facility] - [place], [place] - [address] [place] [address] Phone: [phone] Fax: [phone]     Social Drivers of Health (SDOH) Social History: SDOH Screenings   Food Insecurity: No Food Insecurity (02/19/2024)  Housing: Low Risk  (02/19/2024)  Transportation Needs: No Transportation Needs (02/19/2024)  Utilities: Not At Risk (02/19/2024)  Alcohol Screen: Low Risk  (10/11/2023)  Depression (PHQ2-9): Low Risk  (01/19/2024)  Social Connections: Moderately Integrated (02/19/2024)  Recent Concern: Social Connections - Moderately Isolated (02/05/2024)  Tobacco Use: Medium Risk (02/19/2024)   SDOH Interventions:     Readmission Risk Interventions     No data to display

## 2024-02-20 NOTE — Plan of Care (Signed)
  Problem: Education: Goal: Knowledge of General Education information will improve Description: Including pain rating scale, medication(s)/side effects and non-pharmacologic comfort measures Outcome: Progressing   Problem: Health Behavior/Discharge Planning: Goal: Ability to manage health-related needs will improve Outcome: Progressing   Problem: Clinical Measurements: Goal: Ability to maintain clinical measurements within normal limits will improve Outcome: Progressing Goal: Will remain free from infection Outcome: Progressing Goal: Diagnostic test results will improve Outcome: Progressing Goal: Respiratory complications will improve Outcome: Progressing Goal: Cardiovascular complication will be avoided Outcome: Progressing   Problem: Activity: Goal: Risk for activity intolerance will decrease Outcome: Progressing   Problem: Coping: Goal: Level of anxiety will decrease Outcome: Progressing   Problem: Elimination: Goal: Will not experience complications related to bowel motility Outcome: Progressing Goal: Will not experience complications related to urinary retention Outcome: Progressing   Problem: Pain Managment: Goal: General experience of comfort will improve and/or be controlled Outcome: Progressing   Problem: Safety: Goal: Ability to remain free from injury will improve Outcome: Progressing   Problem: Skin Integrity: Goal: Risk for impaired skin integrity will decrease Outcome: Progressing   Problem: Education: Goal: Ability to describe self-care measures that may prevent or decrease complications (Diabetes Survival Skills Education) will improve Outcome: Progressing Goal: Individualized Educational Video(s) Outcome: Progressing   Problem: Coping: Goal: Ability to adjust to condition or change in health will improve Outcome: Progressing   Problem: Fluid Volume: Goal: Ability to maintain a balanced intake and output will improve Outcome: Progressing    Problem: Health Behavior/Discharge Planning: Goal: Ability to identify and utilize available resources and services will improve Outcome: Progressing Goal: Ability to manage health-related needs will improve Outcome: Progressing   Problem: Metabolic: Goal: Ability to maintain appropriate glucose levels will improve Outcome: Progressing   Problem: Nutritional: Goal: Maintenance of adequate nutrition will improve Outcome: Progressing Goal: Progress toward achieving an optimal weight will improve Outcome: Progressing

## 2024-02-20 NOTE — Plan of Care (Signed)
  Problem: Clinical Measurements: Goal: Will remain free from infection Outcome: Progressing   Problem: Nutrition: Goal: Adequate nutrition will be maintained Outcome: Progressing   Problem: Coping: Goal: Level of anxiety will decrease Outcome: Progressing   Problem: Elimination: Goal: Will not experience complications related to urinary retention Outcome: Progressing   Problem: Pain Managment: Goal: General experience of comfort will improve and/or be controlled Outcome: Progressing   Problem: Coping: Goal: Ability to adjust to condition or change in health will improve Outcome: Progressing   Problem: Fluid Volume: Goal: Ability to maintain a balanced intake and output will improve 02/20/2024 1742 by Blinda Georges BIRCH, RN Outcome: Progressing 02/20/2024 1730 by Blinda Georges BIRCH, RN Outcome: Progressing   Problem: Health Behavior/Discharge Planning: Goal: Ability to identify and utilize available resources and services will improve Outcome: Progressing   Problem: Nutritional: Goal: Maintenance of adequate nutrition will improve Outcome: Progressing   Problem: Tissue Perfusion: Goal: Adequacy of tissue perfusion will improve Outcome: Progressing

## 2024-02-20 NOTE — Progress Notes (Signed)
 [facility] Infectious Diseases Follow Up Note  Patient Identification: Patient Name: Marcus Simpson MRN: [id number] Admit Date: 02/18/2024  6:15 PM Age: 78 y.o.Today's Date: 02/20/2024  Reason for Visit: Follow-up on bacteremia  Principal Problem:   Septic shock (HCC)   Antibiotics: Zosyn 11/29-11/30, cefepime  12/1- Vancomycin  11/29, daptomycin  12/1- Daptomycin  prior to admission  Lines/Hardwares:   Interval Events: Remains afebrile Labs remarkable for creatinine at 2.85, WBC down to 23.5, hemoglobin 9.1 Blood culture and urine culture positive for Pseudomonas aeruginosa  Assessment 78 year old male with prior history as below including HTN, DM2, nephrolithiasis, symptomatic bradycardia, prostate cancer, recent admission for MRSA bacteremia in the setting of left obstructing ureteral stone s/p ureteral stent placement on IV daptomycin  for 6 weeks who initially presented with fever, weakness, hypertension, found to be in septic shock requiring ICU admission. ID engaged for    # PsA bacteremia/Complicated UTI  - 11/16 s/p cystoscopy, left retrograde pyelogram with left ureteral stent placement -  No Intervention per urology  # Leukocytosis 2/2 above   # AKI on CKD 4  Recommendations Will change vancomycin  to daptomycin  in the setting of CKD, and Zosyn to cefepime  Follow-up sensitivities of Pseudomonas aeruginosa Monitor CBC and BMP on antibiotics Universal/standard isolation precautions Following  Rest of the management as per the primary team. Thank you for the consult. Please page with pertinent questions or concerns.  ______________________________________________________________________ Subjective patient seen and examined at the bedside.  No complaints.  Past Medical History:  Diagnosis Date   Arthritis    Bradycardia    asymptomatic    Diabetes mellitus without complication (HCC)    History of kidney  stones    Hypertension    Prostate cancer [facility])    Past Surgical History:  Procedure Laterality Date   COLONOSCOPY     CYSTOSCOPY WITH STENT PLACEMENT Right 12/28/2022   Procedure: CYSTOSCOPY WITH STENT PLACEMENT;  Surgeon: Carolee Sherwood [name], MD;  Location: [facility] OR;  Service: Urology;  Laterality: Right;   CYSTOSCOPY WITH STENT PLACEMENT Left 02/05/2024   Procedure: CYSTOSCOPY, WITH STENT INSERTION. URETHRAL DILATION;  Surgeon: Shane Steffan BROCKS, MD;  Location: [facility] ORS;  Service: Urology;  Laterality: Left;   EXTRACORPOREAL SHOCK WAVE LITHOTRIPSY Right 01/25/2023   Procedure: EXTRACORPOREAL SHOCK WAVE LITHOTRIPSY (ESWL);  Surgeon: [name], MD;  Location: [facility] ORS;  Service: Urology;  Laterality: Right;   FLEXIBLE SIGMOIDOSCOPY N/A 04/02/2015   Procedure: FLEXIBLE SIGMOIDOSCOPY;  Surgeon: Claudis RAYMOND Rivet, MD;  Location: [facility] SUITE;  Service: Endoscopy;  Laterality: N/A;  1:45 - moved to 1/11 @ 8:30 - Ann to notify   KIDNEY STONE SURGERY     RADIOACTIVE SEED IMPLANT N/A 12/29/2023   Procedure: INSERTION, RADIATION SOURCE, PROSTATE;  Surgeon: Sherrilee Belvie CROME, MD;  Location: [facility] ORS;  Service: Urology;  Laterality: N/A;   SPACE OAR INSTILLATION N/A 12/29/2023   Procedure: INJECTION, HYDROGEL SPACER;  Surgeon: Sherrilee Belvie CROME, MD;  Location: [facility] ORS;  Service: Urology;  Laterality: N/A;   TOTAL KNEE ARTHROPLASTY Left 05/02/2017   Procedure: LEFT TOTAL KNEE ARTHROPLASTY;  Surgeon: Melodi Lerner, MD;  Location: [facility] ORS;  Service: Orthopedics;  Laterality: Left;   Vitals BP (!) 143/64 (BP Location: Left Arm)   Pulse 90   Temp 98.3 F (36.8 C) (Oral)   Resp 18   Ht 5' 11 (1.803 m)   Wt 110.9 kg   SpO2 95%   BMI 34.10 kg/m     Physical Exam Constitutional: Elderly male sitting in the recliner,  nontoxic appearing    Comments: HEENT WNL  Cardiovascular:     Rate and Rhythm: Normal rate     Heart sounds: s1 and S2  Pulmonary:     Effort: Pulmonary effort is normal.      Comments: Normal lung sounds  Abdominal:     Palpations: Abdomen is soft.     Tenderness: Nondistended and nontender  Musculoskeletal:        General: No swelling or tenderness in peripheral joints including left TKA  Skin:    Comments: No rashes  Neurological:     General: Awake, alert and oriented, grossly nonfocal  Psychiatric:        Mood and Affect: Mood normal.   Pertinent Microbiology Results for orders placed or performed during the hospital encounter of 02/18/24  Resp panel by RT-PCR (RSV, Flu A&B, Covid) Anterior Nasal Swab     Status: None   Collection Time: 02/18/24  6:25 PM   Specimen: Anterior Nasal Swab  Result Value Ref Range Status   SARS Coronavirus 2 by RT PCR NEGATIVE NEGATIVE Final    Comment: (NOTE) SARS-CoV-2 target nucleic acids are NOT DETECTED.  The SARS-CoV-2 RNA is generally detectable in upper respiratory specimens during the acute phase of infection. The lowest concentration of SARS-CoV-2 viral copies this assay can detect is 138 copies/mL. A negative result does not preclude SARS-Cov-2 infection and should not be used as the sole basis for treatment or other patient management decisions. A negative result may occur with  improper specimen collection/handling, submission of specimen other than nasopharyngeal swab, presence of viral mutation(s) within the areas targeted by this assay, and inadequate number of viral copies(<138 copies/mL). A negative result must be combined with clinical observations, patient history, and epidemiological information. The expected result is Negative.  Fact Sheet for Patients:  bloggercourse.com  Fact Sheet for Healthcare Providers:  seriousbroker.it  This test is no t yet approved or cleared by the [place]  FDA and  has been authorized for detection and/or diagnosis of SARS-CoV-2 by FDA under an Emergency Use Authorization (EUA). This EUA will remain  in  effect (meaning this test can be used) for the duration of the COVID-19 declaration under Section 564(b)(1) of the Act, 21 U.S.C.section 360bbb-3(b)(1), unless the authorization is terminated  or revoked sooner.       Influenza A by PCR NEGATIVE NEGATIVE Final   Influenza B by PCR NEGATIVE NEGATIVE Final    Comment: (NOTE) The Xpert Xpress SARS-CoV-2/FLU/RSV plus assay is intended as an aid in the diagnosis of influenza from Nasopharyngeal swab specimens and should not be used as a sole basis for treatment. Nasal washings and aspirates are unacceptable for Xpert Xpress SARS-CoV-2/FLU/RSV testing.  Fact Sheet for Patients: bloggercourse.com  Fact Sheet for Healthcare Providers: seriousbroker.it  This test is not yet approved or cleared by the [place]  FDA and has been authorized for detection and/or diagnosis of SARS-CoV-2 by FDA under an Emergency Use Authorization (EUA). This EUA will remain in effect (meaning this test can be used) for the duration of the COVID-19 declaration under Section 564(b)(1) of the Act, 21 U.S.C. section 360bbb-3(b)(1), unless the authorization is terminated or revoked.     Resp Syncytial Virus by PCR NEGATIVE NEGATIVE Final    Comment: (NOTE) Fact Sheet for Patients: bloggercourse.com  Fact Sheet for Healthcare Providers: seriousbroker.it  This test is not yet approved or cleared by the [place]  FDA and has been authorized for detection and/or diagnosis of SARS-CoV-2 by FDA  under an Emergency Use Authorization (EUA). This EUA will remain in effect (meaning this test can be used) for the duration of the COVID-19 declaration under Section 564(b)(1) of the Act, 21 U.S.C. section 360bbb-3(b)(1), unless the authorization is terminated or revoked.  Performed at [facility], [address] [place], [place] [address]   Blood Culture (routine  x 2)     Status: None (Preliminary result)   Collection Time: 02/18/24  6:25 PM   Specimen: BLOOD  Result Value Ref Range Status   Specimen Description BLOOD LEFT ANTECUBITAL  Final   Special Requests   Final    BOTTLES DRAWN AEROBIC AND ANAEROBIC Blood Culture adequate volume   Culture   Final    NO GROWTH 2 DAYS Performed at [facility], [address], [place], [place] [address]    Report Status PENDING  Incomplete  Blood Culture (routine x 2)     Status: Abnormal (Preliminary result)   Collection Time: 02/18/24  6:30 PM   Specimen: BLOOD RIGHT ARM  Result Value Ref Range Status   Specimen Description   Final    BLOOD RIGHT ARM Performed at [facility], [address], [place], [place] [address]    Special Requests   Final    BOTTLES DRAWN AEROBIC AND ANAEROBIC Blood Culture adequate volume Performed at [facility], [address] [place], [place] [address]    Culture  Setup Time   Final    AEROBIC BOTTLE ONLY GRAM NEGATIVE RODS Gram Stain Report Called to,Read Back By and Verified With: Y. BALDERAS ON 02/19/2024 @4 :14PM BY T. HAMER  CRITICAL RESULT CALLED TO, READ BACK BY AND VERIFIED WITH: PHARMD J LEDFORD 11//30/2025 @ 2230 BY AB    Culture (A)  Final    PSEUDOMONAS AERUGINOSA SUSCEPTIBILITIES TO FOLLOW Performed at [facility], [address] [place], [place] [address]    Report Status PENDING  Incomplete  Blood Culture ID Panel (Reflexed)     Status: Abnormal   Collection Time: 02/18/24  6:30 PM  Result Value Ref Range Status   Enterococcus faecalis NOT DETECTED NOT DETECTED Final   Enterococcus Faecium NOT DETECTED NOT DETECTED Final   Listeria monocytogenes NOT DETECTED NOT DETECTED Final   Staphylococcus species NOT DETECTED NOT DETECTED Final   Staphylococcus aureus (BCID) NOT DETECTED NOT DETECTED Final   Staphylococcus epidermidis NOT DETECTED NOT DETECTED Final   Staphylococcus lugdunensis NOT DETECTED NOT DETECTED Final   Streptococcus  species NOT DETECTED NOT DETECTED Final   Streptococcus agalactiae NOT DETECTED NOT DETECTED Final   Streptococcus pneumoniae NOT DETECTED NOT DETECTED Final   Streptococcus pyogenes NOT DETECTED NOT DETECTED Final   A.calcoaceticus-baumannii NOT DETECTED NOT DETECTED Final   Bacteroides fragilis NOT DETECTED NOT DETECTED Final   Enterobacterales NOT DETECTED NOT DETECTED Final   Enterobacter cloacae complex NOT DETECTED NOT DETECTED Final   Escherichia coli NOT DETECTED NOT DETECTED Final   Klebsiella aerogenes NOT DETECTED NOT DETECTED Final   Klebsiella oxytoca NOT DETECTED NOT DETECTED Final   Klebsiella pneumoniae NOT DETECTED NOT DETECTED Final   Proteus species NOT DETECTED NOT DETECTED Final   Salmonella species NOT DETECTED NOT DETECTED Final   Serratia marcescens NOT DETECTED NOT DETECTED Final   Haemophilus influenzae NOT DETECTED NOT DETECTED Final   Neisseria meningitidis NOT DETECTED NOT DETECTED Final   Pseudomonas aeruginosa DETECTED (A) NOT DETECTED Final    Comment: CRITICAL RESULT CALLED TO, READ BACK BY AND VERIFIED WITH: PHARMD J LEDFORD  11//30/2025 @ 2230 BY AB    Stenotrophomonas maltophilia NOT DETECTED NOT DETECTED Final   Candida albicans NOT DETECTED NOT DETECTED Final   Candida auris NOT DETECTED NOT DETECTED Final   Candida glabrata NOT DETECTED NOT DETECTED Final   Candida krusei NOT DETECTED NOT DETECTED Final   Candida parapsilosis NOT DETECTED NOT DETECTED Final   Candida tropicalis NOT DETECTED NOT DETECTED Final   Cryptococcus neoformans/gattii NOT DETECTED NOT DETECTED Final   CTX-M ESBL NOT DETECTED NOT DETECTED Final   Carbapenem resistance IMP NOT DETECTED NOT DETECTED Final   Carbapenem resistance KPC NOT DETECTED NOT DETECTED Final   Carbapenem resistance NDM NOT DETECTED NOT DETECTED Final   Carbapenem resistance VIM NOT DETECTED NOT DETECTED Final    Comment: Performed at [facility], [address] [place], [place] [address]  MRSA  Next Gen by PCR, Nasal     Status: None   Collection Time: 02/18/24  9:57 PM   Specimen: Nasal Mucosa; Nasal Swab  Result Value Ref Range Status   MRSA by PCR Next Gen NOT DETECTED NOT DETECTED Final    Comment: (NOTE) The GeneXpert MRSA Assay (FDA approved for NASAL specimens only), is one component of a comprehensive MRSA colonization surveillance program. It is not intended to diagnose MRSA infection nor to guide or monitor treatment for MRSA infections. Test performance is not FDA approved in patients less than 32 years old. Performed at [facility], [address] [place], [place] [address]   Urine Culture     Status: Abnormal (Preliminary result)   Collection Time: 02/18/24 10:45 PM   Specimen: Urine, Random  Result Value Ref Range Status   Specimen Description URINE, RANDOM  Final   Special Requests NONE Reflexed from [id number]  Final   Culture (A)  Final    60,000 COLONIES/mL PSEUDOMONAS AERUGINOSA SUSCEPTIBILITIES TO FOLLOW Performed at [facility], [address] [place], [place] [address]    Report Status PENDING  Incomplete   Pertinent Lab.    Latest Ref Rng & Units 02/20/2024    2:14 AM 02/19/2024    3:50 AM 02/18/2024   11:10 PM  CBC  WBC 4.0 - 10.5 K/uL 23.5  49.8    Hemoglobin 13.0 - 17.0 g/dL 9.1  9.8  89.7   Hematocrit 39.0 - 52.0 % 27.7  29.7  30.0   Platelets 150 - 400 K/uL 264  350        Latest Ref Rng & Units 02/20/2024    2:14 AM 02/19/2024    3:50 AM 02/18/2024   11:10 PM  CMP  Glucose 70 - 99 mg/dL 879  872    BUN 8 - 23 mg/dL 43  38    Creatinine 9.38 - 1.24 mg/dL 7.14  7.22    Sodium 864 - 145 mmol/L 134  132  132   Potassium 3.5 - 5.1 mmol/L 4.1  5.0  4.9   Chloride 98 - 111 mmol/L 103  98    CO2 22 - 32 mmol/L 20  21    Calcium  8.9 - 10.3 mg/dL 7.9  8.4       Pertinent Imaging today Plain films and CT images have been personally visualized and interpreted; radiology reports have been reviewed. Decision making  incorporated into the Impression /   ECHOCARDIOGRAM LIMITED Result Date: 02/19/2024    ECHOCARDIOGRAM LIMITED REPORT   Patient Name:   Marcus Simpson Date of Exam: 02/19/2024 Medical Rec #:  [id number]         Height:       71.0 in Accession #:    [id number]        Weight:       238.8 lb Date of Birth:  06-05-1945          BSA:          2.273 m Patient Age:    78 years          BP:           121/52 mmHg Patient Gender: M                 HR:           70 bpm. Exam Location:  Inpatient Procedure: Limited Echo, Limited Color Doppler and Cardiac Doppler (Both            Spectral and Color Flow Doppler were utilized during procedure). Indications:    Sepsis  History:        Patient has prior history of Echocardiogram examinations, most                 recent 02/07/2024. Risk Factors:Hypertension, Diabetes and                 Dyslipidemia.  Sonographer:    Tinnie Barefoot RDCS Referring Phys: [id number] JAMIE H [facility]AGEN[facility] IMPRESSIONS  1. Left ventricular ejection fraction, by estimation, is 60 to 65%. The left ventricle has normal function. The left ventricle has no regional wall motion abnormalities.  2. Right ventricular systolic function is normal. The right ventricular size is normal. There is normal pulmonary artery systolic pressure. The estimated right ventricular systolic pressure is 28.0 mmHg.  3. The mitral valve is normal in structure. Trivial mitral valve regurgitation. No evidence of mitral stenosis.  4. The aortic valve is tricuspid. Aortic valve regurgitation is not visualized. Aortic valve sclerosis is present, with no evidence of aortic valve stenosis.  5. The inferior vena cava is normal in size with greater than 50% respiratory variability, suggesting right atrial pressure of 3 mmHg. FINDINGS  Left Ventricle: Left ventricular ejection fraction, by estimation, is 60 to 65%. The left ventricle has normal function. The left ventricle has no regional wall motion abnormalities. The left ventricular  internal cavity size was normal in size. There is  no left ventricular hypertrophy. Right Ventricle: The right ventricular size is normal. No increase in right ventricular wall thickness. Right ventricular systolic function is normal. There is normal pulmonary artery systolic pressure. The tricuspid regurgitant velocity is 2.50 m/s, and  with an assumed right atrial pressure of 3 mmHg, the estimated right ventricular systolic pressure is 28.0 mmHg. Left Atrium: Left atrial size was normal in size. Right Atrium: Right atrial size was normal in size. Pericardium: There is no evidence of pericardial effusion. Mitral Valve: The mitral valve is normal in structure. Trivial mitral valve regurgitation. No evidence of mitral valve stenosis. Tricuspid Valve: The tricuspid valve is normal in structure. Tricuspid valve regurgitation is trivial. No evidence of tricuspid stenosis. Aortic Valve: The aortic valve is tricuspid. Aortic valve regurgitation is not visualized. Aortic valve sclerosis is present, with no evidence of aortic valve stenosis. Pulmonic Valve: The pulmonic valve was normal in structure. Pulmonic valve regurgitation is trivial. No evidence of pulmonic stenosis. Aorta: The aortic root is normal in size and structure. Venous: The inferior vena cava is normal in size with greater than 50% respiratory variability, suggesting right atrial pressure of  3 mmHg. IAS/Shunts: No atrial level shunt detected by color flow Doppler. Additional Comments: Spectral Doppler performed. Color Doppler performed.  LEFT VENTRICLE PLAX 2D LVIDd:         3.70 cm LVIDs:         2.40 cm LV PW:         1.00 cm LV IVS:        1.10 cm  IVC IVC diam: 1.90 cm LEFT ATRIUM         Index LA diam:    3.60 cm 1.58 cm/m  AORTIC VALVE LVOT Vmax:   97.60 cm/s LVOT Vmean:  65.300 cm/s LVOT VTI:    0.233 m MITRAL VALVE               TRICUSPID VALVE MV Area (PHT): 2.34 cm    TR Peak grad:   25.0 mmHg MV Decel Time: 324 msec    TR Vmax:        250.00  cm/s MV E velocity: 72.40 cm/s MV A velocity: 69.70 cm/s  SHUNTS MV E/A ratio:  1.04        Systemic VTI: 0.23 m Wilbert Bihari MD Electronically signed by Wilbert Bihari MD Signature Date/Time: 02/19/2024/11:15:39 AM    Final    DG Abd 1 View Result Date: 02/18/2024 EXAM: 1 VIEW XRAY OF THE ABDOMEN 02/18/2024 10:58:00 PM COMPARISON: 02/18/2024 CLINICAL HISTORY: Abdominal distention FINDINGS: BOWEL: Nonobstructive bowel gas pattern. SOFT TISSUES: Left ureteral stent in place. Prostate brachytherapy seeds noted. 7 mm left mid ureteral calculus adjacent to the stent. 9 mm calculus projects over the right kidney. BONES: No acute osseous abnormality. IMPRESSION: 1. 7 mm left mid ureteral calculus adjacent to the stent. 2. 9 mm calculus projects over the right kidney. Electronically signed by: Norman Gatlin MD 02/18/2024 11:05 PM EST RP Workstation: [id number]   CT ABDOMEN PELVIS WO CONTRAST Result Date: 02/18/2024 CLINICAL DATA:  Left upper quadrant pain. EXAM: CT ABDOMEN AND PELVIS WITHOUT CONTRAST TECHNIQUE: Multidetector CT imaging of the abdomen and pelvis was performed following the standard protocol without IV contrast. RADIATION DOSE REDUCTION: This exam was performed according to the departmental dose-optimization program which includes automated exposure control, adjustment of the mA and/or kV according to patient size and/or use of iterative reconstruction technique. COMPARISON:  CT abdomen and pelvis 02/05/2024. FINDINGS: Lower chest: No acute abnormality. Hepatobiliary: No focal liver abnormality is seen. No gallstones, gallbladder wall thickening, or biliary dilatation. Pancreas: Unremarkable. No pancreatic ductal dilatation or surrounding inflammatory changes. Spleen: Normal in size without focal abnormality. Adrenals/Urinary Tract: There is a new left ureteral stent in place in proper position. There is no left-sided hydronephrosis. Stable position of calculus in the left mid ureter measuring 5 mm.  Bilateral renal calculi are otherwise stable. Bilateral renal cysts have not significantly changed. Adrenal glands are within normal limits. There is diffuse bladder wall thickening versus normal under distension. Stomach/Bowel: Stomach is within normal limits. No evidence of bowel wall thickening, distention, or inflammatory changes. The appendix is not visualized. Vascular/Lymphatic: Aortic atherosclerosis. No enlarged abdominal or pelvic lymph nodes. There are few nonenlarged left retroperitoneal lymph nodes, unchanged. Reproductive: Prostate radiotherapy seeds are present. Other: There are small fat containing inguinal hernias. There is no ascites. Musculoskeletal: The bones are osteopenic. Chronic compression deformities of T12 and L1 appear unchanged. IMPRESSION: 1. New left ureteral stent in place. No hydronephrosis. 2. Stable position of 5 mm calculus in the left mid ureter. 3. Stable bilateral renal calculi. 4. Diffuse bladder wall  thickening versus normal under distension. Correlate clinically for cystitis. 5. Aortic atherosclerosis. Aortic Atherosclerosis (ICD10-I70.0). Electronically Signed   By: Greig Pique M.D.   On: 02/18/2024 19:54   DG Chest Port 1 View Result Date: 02/18/2024 CLINICAL DATA:  Sepsis EXAM: PORTABLE CHEST 1 VIEW COMPARISON:  Chest x-ray 02/05/2024 FINDINGS: Right upper extremity PICC terminates in the SVC. The heart size and mediastinal contours are within normal limits. Both lungs are clear. The visualized skeletal structures are unremarkable. IMPRESSION: No active disease. Electronically Signed   By: Greig Pique M.D.   On: 02/18/2024 18:42   DG Knee Complete 4 Views Right Result Date: 02/10/2024 EXAM: 4 OR MORE VIEW(S) XRAY OF THE KNEE 02/10/2024 10:42:00 AM COMPARISON: None available. CLINICAL HISTORY: 13689 Swelling [phone] FINDINGS: BONES AND JOINTS: No acute fracture. No focal osseous lesion. No joint dislocation. Mild tri-compartment degenerative changes.  Chondrocalcinosis. Large suprapatellar knee joint effusion . SOFT TISSUES: The soft tissues are unremarkable. IMPRESSION: 1. Large suprapatellar knee joint effusion. 2. Tricompartmental osteoarthritis. Electronically signed by: Waddell Calk MD 02/10/2024 02:07 PM EST RP Workstation: [id number]   US  EKG SITE RITE Result Date: 02/10/2024 If Site Rite image not attached, placement could not be confirmed due to current cardiac rhythm.  US  EKG SITE RITE Result Date: 02/10/2024 If Site Rite image not attached, placement could not be confirmed due to current cardiac rhythm.  ECHOCARDIOGRAM COMPLETE Result Date: 02/07/2024    ECHOCARDIOGRAM REPORT   Patient Name:   Marcus Simpson Date of Exam: 02/07/2024 Medical Rec #:  [id number]         Height:       71.0 in Accession #:    [id number]        Weight:       249.1 lb Date of Birth:  02-25-1946          BSA:          2.315 m Patient Age:    78 years          BP:           127/67 mmHg Patient Gender: M                 HR:           66 bpm. Exam Location:  Inpatient Procedure: 2D Echo and Intracardiac Opacification Agent (Both Spectral and Color            Flow Doppler were utilized during procedure). Indications:    Endocarditis  History:        Patient has no prior history of Echocardiogram examinations.  Sonographer:    Charmaine Gaskins Referring Phys: [id number] [facility] IMPRESSIONS  1. NO obvious vegetations Recommend TEE if clinically indicated.  2. Left ventricular ejection fraction, by estimation, is 60 to 65%. The left ventricle has normal function. The left ventricle has no regional wall motion abnormalities. Left ventricular diastolic parameters were normal.  3. Right ventricular systolic function is normal. The right ventricular size is normal.  4. The mitral valve is normal in structure. Trivial mitral valve regurgitation.  5. The aortic valve is normal in structure. Aortic valve regurgitation is not visualized.  6. Aortic dilatation noted. There is  mild dilatation of the ascending aorta, measuring 41 mm. FINDINGS  Left Ventricle: Left ventricular ejection fraction, by estimation, is 60 to 65%. The left ventricle has normal function. The left ventricle has no regional wall motion abnormalities. Definity  contrast agent was given IV to delineate the left ventricular  endocardial borders. The left ventricular internal cavity size was normal in size. There is no left ventricular hypertrophy. Left ventricular diastolic parameters were normal. Right Ventricle: The right ventricular size is normal. Right vetricular wall thickness was not assessed. Right ventricular systolic function is normal. Left Atrium: Left atrial size was normal in size. Right Atrium: Right atrial size was normal in size. Pericardium: There is no evidence of pericardial effusion. Mitral Valve: The mitral valve is normal in structure. Trivial mitral valve regurgitation. Tricuspid Valve: The tricuspid valve is normal in structure. Tricuspid valve regurgitation is mild. Aortic Valve: The aortic valve is normal in structure. Aortic valve regurgitation is not visualized. Pulmonic Valve: The pulmonic valve was normal in structure. Pulmonic valve regurgitation is mild. Aorta: The aortic root is normal in size and structure and aortic dilatation noted. There is mild dilatation of the ascending aorta, measuring 41 mm. IAS/Shunts: No atrial level shunt detected by color flow Doppler.  LEFT VENTRICLE PLAX 2D LVIDd:         4.90 cm   Diastology LVIDs:         2.80 cm   LV e' medial:    6.64 cm/s LV PW:         0.90 cm   LV E/e' medial:  11.8 LV IVS:        0.90 cm   LV e' lateral:   7.18 cm/s LVOT diam:     2.20 cm   LV E/e' lateral: 10.9 LVOT Area:     3.80 cm  RIGHT VENTRICLE RV Basal diam:  3.30 cm RV Mid diam:    3.50 cm RV S prime:     14.10 cm/s LEFT ATRIUM             Index        RIGHT ATRIUM           Index LA Vol (A2C):   78.4 ml 33.87 ml/m  RA Area:     16.80 cm LA Vol (A4C):   71.1 ml 30.71  ml/m  RA Volume:   41.60 ml  17.97 ml/m LA Biplane Vol: 75.6 ml 32.66 ml/m   AORTA Ao Root diam: 3.50 cm Ao Asc diam:  4.10 cm MITRAL VALVE               TRICUSPID VALVE MV Area (PHT): 3.38 cm    TR Peak grad:   29.4 mmHg MV E velocity: 78.40 cm/s  TR Vmax:        271.00 cm/s MV A velocity: 87.80 cm/s MV E/A ratio:  0.89        SHUNTS                            Systemic Diam: 2.20 cm Vina Gull MD Electronically signed by Vina Gull MD Signature Date/Time: 02/07/2024/2:08:38 PM    Final    DG C-Arm 1-60 Min-No Report Result Date: 02/05/2024 Fluoroscopy was utilized by the requesting physician.  No radiographic interpretation.   CT ABDOMEN PELVIS WO CONTRAST Result Date: 02/05/2024 CLINICAL DATA:  Kidney infection and is on antibiotics, presenting with fever. EXAM: CT ABDOMEN AND PELVIS WITHOUT CONTRAST TECHNIQUE: Multidetector CT imaging of the abdomen and pelvis was performed following the standard protocol without IV contrast. RADIATION DOSE REDUCTION: This exam was performed according to the departmental dose-optimization program which includes automated exposure control, adjustment of the mA and/or kV according to patient size and/or use of iterative  reconstruction technique. COMPARISON:  October 30, 2023 FINDINGS: Lower chest: Mild lingular and left basilar atelectasis is seen. Hepatobiliary: No focal liver abnormality is seen. No gallstones, gallbladder wall thickening, or biliary dilatation. Pancreas: Unremarkable. No pancreatic ductal dilatation or surrounding inflammatory changes. Spleen: Normal in size without focal abnormality. Adrenals/Urinary Tract: Adrenal glands are unremarkable. Kidneys are normal size. Stable bilateral parenchymal and peripelvic renal cysts are seen. Multiple subcentimeter non-obstructing renal calculi of various sizes are seen within both kidneys. A 6 mm obstructing renal calculus is seen within the proximal to mid left ureter with mild left-sided hydronephrosis and  hydroureter. There is mild to moderate severity diffuse urinary bladder wall thickening. Stomach/Bowel: Stomach is within normal limits. Appendix appears normal. No evidence of bowel wall thickening, distention, or inflammatory changes. Vascular/Lymphatic: Aortic atherosclerosis. No enlarged abdominal or pelvic lymph nodes. 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 Other: No abdominal wall hernia or abnormality. No abdominopelvic ascites. Musculoskeletal: Multilevel degenerative changes are seen throughout the lumbar spine. IMPRESSION: 1. 6 mm obstructing renal calculus within the proximal to mid left ureter. 2. Multiple bilateral subcentimeter non-obstructing renal calculi. 3. Stable bilateral parenchymal and peripelvic renal cysts. No follow-up imaging is recommended. This recommendation follows ACR consensus guidelines: Management of the Incidental Renal Mass on CT: A White Paper of the ACR Incidental Findings Committee. J Am Coll Radiol [phone]. 4. Mild to moderate severity diffuse urinary bladder wall thickening which may represent sequelae associated with cystitis. Correlation with urinalysis is recommended. 5. 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 6. Aortic atherosclerosis. Electronically Signed   By: Suzen Dials M.D.   On: 02/05/2024 17:01   DG Chest Port 1 View Result Date: 02/05/2024 CLINICAL DATA:  Possible sepsis.  Kidney infection on antibiotics. EXAM: PORTABLE CHEST 1 VIEW COMPARISON:  10/30/2023, 12/29/2022 FINDINGS: Lungs are hypoinflated with chronic lateral left base opacification. No acute airspace process or effusion. Cardiomediastinal silhouette and remainder of the exam is unchanged. IMPRESSION: Hypoinflation without acute cardiopulmonary disease. Electronically Signed   By: Toribio Agreste M.D.   On: 02/05/2024 15:07   I spent 50  minutes involved in face-to-face and non-face-to-face activities for this patient on the day of the visit. Professional time spent includes the following activities: Preparing to see the patient (review of tests), Obtaining and reviewing separately obtained history (prior ID note, hospitalist progress note, urology note), Performing a medically appropriate examination and evaluation, Ordering medications, communicating with other health care professionals, Documenting clinical information in the EMR, Independently interpreting results (not separately reported), and Care coordination (not separately reported).  Electronically signed by:   Plan d/w requesting provider as well as ID pharm D  Of note, portions of this note may have been created with voice recognition software. While this note has been edited for accuracy, occasional wrong-word or 'sound-a-like' substitutions may have occurred due to the inherent limitations of voice recognition software.   Electronically signed by:   [name], MD Infectious Disease Physician [facility] Pager: [phone]

## 2024-02-20 NOTE — Progress Notes (Signed)
 Pharmacy Antibiotic Note  Marcus Simpson is a 78 y.o. male admitted on 02/18/2024 with urosepsis.  Pharmacy has been consulted for Daptomycin  + Cefepime  dosing.   The patient had a recent MR[facility] bacteremia at [facility] in November - did not pursue TEE so plan was for Daptomycin  thru 03/20/24. CK 103  This admit with concern for urosepsis with urine and blood cultures showing Pseudomonas, transitioned to Cefepime .   Plan: - D/c Zosyn + Vancomycin   - Start Cefepime2 g IV every 24 hours - Resume Daptomycin  - adjust to 700 mg IV every 48 hours for renal function  Height: 5' 11 (180.3 cm) Weight: 110.9 kg (244 lb 8 oz) IBW/kg (Calculated) : 75.3  Temp (24hrs), Avg:98.5 F (36.9 C), Min:97.9 F (36.6 C), Max:99.3 F (37.4 C)  Recent Labs  Lab 02/18/24 1825 02/18/24 2041 02/18/24 2219 02/18/24 2305 02/19/24 0350 02/20/24 0214  WBC 63.9*  --   --   --  49.8* 23.5*  CREATININE 2.38*  --   --  2.83* 2.77* 2.85*  LATICACIDVEN 5.6* 7.2* 5.0*  --  1.7  --     Estimated Creatinine Clearance: 26.2 mL/min (A) (by C-G formula based on SCr of 2.85 mg/dL (H)).    Allergies  Allergen Reactions   Dilaudid [Hydromorphone Hcl] Anaphylaxis and Other (See Comments)    Cardiac arrest    Antimicrobials this admission: Daptomycin  PTA >> (12/30) Zosyn 11/29 >> 12/1 Vancomycin  11/29 >> 11/30  Dose adjustments this admission:   Microbiology results: 11/29 BCx >> 1/4 PsA 11/29 UCx >> 60k PsA  Thank you for allowing pharmacy to be a part of this patient's care.  Almarie Lunger, PharmD, BCPS, BCIDP Infectious Diseases Clinical Pharmacist 02/20/2024 1:56 PM   **Pharmacist phone directory can now be found on [email or web address] ([email or web address]).  Listed under [facility].

## 2024-02-21 DIAGNOSIS — R7881 Bacteremia: Secondary | ICD-10-CM | POA: Diagnosis not present

## 2024-02-21 DIAGNOSIS — A419 Sepsis, unspecified organism: Secondary | ICD-10-CM

## 2024-02-21 DIAGNOSIS — B965 Pseudomonas (aeruginosa) (mallei) (pseudomallei) as the cause of diseases classified elsewhere: Secondary | ICD-10-CM | POA: Diagnosis not present

## 2024-02-21 DIAGNOSIS — B958 Unspecified staphylococcus as the cause of diseases classified elsewhere: Secondary | ICD-10-CM

## 2024-02-21 DIAGNOSIS — R6521 Severe sepsis with septic shock: Secondary | ICD-10-CM

## 2024-02-21 LAB — BASIC METABOLIC PANEL WITH GFR
Anion gap: 9 (ref 5–15)
BUN: 39 mg/dL — ABNORMAL HIGH (ref 8–23)
CO2: 23 mmol/L (ref 22–32)
Calcium: 8.5 mg/dL — ABNORMAL LOW (ref 8.9–10.3)
Chloride: 104 mmol/L (ref 98–111)
Creatinine, Ser: 2.59 mg/dL — ABNORMAL HIGH (ref 0.61–1.24)
GFR, Estimated: 24 mL/min — ABNORMAL LOW (ref >60)
Glucose, Bld: 78 mg/dL (ref 70–99)
Potassium: 4.3 mmol/L (ref 3.5–5.1)
Sodium: 136 mmol/L (ref 135–145)

## 2024-02-21 LAB — GLUCOSE, CAPILLARY
Glucose-Capillary: 74 mg/dL (ref 70–99)
Glucose-Capillary: 122 mg/dL — ABNORMAL HIGH (ref 70–99)
Glucose-Capillary: 77 mg/dL (ref 70–99)
Glucose-Capillary: 98 mg/dL (ref 70–99)
Glucose-Capillary: 110 mg/dL — ABNORMAL HIGH (ref 70–99)

## 2024-02-21 LAB — CULTURE, BLOOD (ROUTINE X 2): Special Requests: ADEQUATE

## 2024-02-21 LAB — PHOSPHORUS: Phosphorus: 4 mg/dL (ref 2.5–4.6)

## 2024-02-21 LAB — CBC
HCT: 31.2 % — ABNORMAL LOW (ref 39.0–52.0)
Hemoglobin: 10.1 g/dL — ABNORMAL LOW (ref 13.0–17.0)
MCH: 30 pg (ref 26.0–34.0)
MCHC: 32.4 g/dL (ref 30.0–36.0)
MCV: 92.6 fL (ref 80.0–100.0)
Platelets: 253 K/uL (ref 150–400)
RBC: 3.37 MIL/uL — ABNORMAL LOW (ref 4.22–5.81)
RDW: 13.7 % (ref 11.5–15.5)
WBC: 13.9 K/uL — ABNORMAL HIGH (ref 4.0–10.5)
nRBC: 0 % (ref 0.0–0.2)

## 2024-02-21 LAB — URINE CULTURE: Culture: 60000 — AB

## 2024-02-21 LAB — MAGNESIUM: Magnesium: 2.2 mg/dL (ref 1.7–2.4)

## 2024-02-21 MED ORDER — VITAMIN D-3 125 MCG (5000 UT) PO TABS: ORAL_TABLET | Freq: Every day | ORAL | Status: DC

## 2024-02-21 MED ORDER — INSULIN ASPART 100 UNIT/ML IJ SOLN: 0.0000 [IU] | Freq: Three times a day (TID) | INTRAMUSCULAR | Status: DC

## 2024-02-21 MED ORDER — VITAMIN D 25 MCG (1000 UNIT) PO TABS
1000.0000 [IU] | ORAL_TABLET | Freq: Every day | ORAL | Status: DC
  Administered 2024-02-21 – 2024-02-23 (×3): 1000 [IU] via ORAL
  Filled 2024-02-21 (×3): qty 1

## 2024-02-21 MED ORDER — VITAMIN B-12 2500 MCG SL SUBL: 2500.0000 ug | SUBLINGUAL_TABLET | Freq: Every day | SUBLINGUAL | Status: DC

## 2024-02-21 MED ORDER — TAMSULOSIN HCL 0.4 MG PO CAPS
0.4000 mg | ORAL_CAPSULE | Freq: Every day | ORAL | Status: DC
  Administered 2024-02-21 – 2024-02-23 (×3): 0.4 mg via ORAL
  Filled 2024-02-21 (×3): qty 1

## 2024-02-21 MED ORDER — VITAMIN B-12 1000 MCG PO TABS
2000.0000 ug | ORAL_TABLET | Freq: Every day | ORAL | Status: DC
  Administered 2024-02-21 – 2024-02-23 (×3): 2000 ug via ORAL
  Filled 2024-02-21 (×3): qty 2

## 2024-02-21 MED ORDER — MELATONIN 10 MG PO CHEW: 20.0000 mg | CHEWABLE_TABLET | Freq: Every day | ORAL | Status: DC

## 2024-02-21 MED ORDER — MELATONIN 5 MG PO TABS
20.0000 mg | ORAL_TABLET | Freq: Every day | ORAL | Status: DC
  Administered 2024-02-21 – 2024-02-22 (×2): 20 mg via ORAL
  Filled 2024-02-21 (×2): qty 4

## 2024-02-21 NOTE — Progress Notes (Signed)
 Mobility Specialist Progress Note:    02/21/24 1100  Mobility  Activity Ambulated with assistance  Level of Assistance Contact guard assist, steadying assist  Assistive Device Front wheel walker  Distance Ambulated (ft) 100 ft  Activity Response Tolerated well  Mobility Referral Yes  Mobility visit 1 Mobility  Mobility Specialist Start Time (ACUTE ONLY) 0955  Mobility Specialist Stop Time (ACUTE ONLY) 1008  Mobility Specialist Time Calculation (min) (ACUTE ONLY) 13 min   Pt received in chair agreeable to mobility. No physical assistance needed. No c/o throughout. Attempted to ambulate w/o an AD but d/t unsteadiness pt opt to use RW. Returned to room w/o fault. Left in chair w/ call bell and personal belongings in reach. All needs met.   Thersia Minder Mobility Specialist  Please contact vis Secure Chat or  Rehab Office [phone]

## 2024-02-21 NOTE — Plan of Care (Signed)
  Problem: Education: Goal: Knowledge of General Education information will improve Description: Including pain rating scale, medication(s)/side effects and non-pharmacologic comfort measures Outcome: Progressing   Problem: Health Behavior/Discharge Planning: Goal: Ability to manage health-related needs will improve Outcome: Progressing   Problem: Clinical Measurements: Goal: Ability to maintain clinical measurements within normal limits will improve Outcome: Progressing Goal: Will remain free from infection Outcome: Progressing Goal: Diagnostic test results will improve Outcome: Progressing Goal: Respiratory complications will improve Outcome: Progressing Goal: Cardiovascular complication will be avoided Outcome: Progressing   Problem: Activity: Goal: Risk for activity intolerance will decrease Outcome: Progressing   Problem: Coping: Goal: Level of anxiety will decrease Outcome: Progressing   Problem: Elimination: Goal: Will not experience complications related to bowel motility Outcome: Progressing Goal: Will not experience complications related to urinary retention Outcome: Progressing   Problem: Pain Managment: Goal: General experience of comfort will improve and/or be controlled Outcome: Progressing   Problem: Safety: Goal: Ability to remain free from injury will improve Outcome: Progressing   Problem: Skin Integrity: Goal: Risk for impaired skin integrity will decrease Outcome: Progressing   Problem: Education: Goal: Ability to describe self-care measures that may prevent or decrease complications (Diabetes Survival Skills Education) will improve Outcome: Progressing Goal: Individualized Educational Video(s) Outcome: Progressing   Problem: Coping: Goal: Ability to adjust to condition or change in health will improve Outcome: Progressing   Problem: Fluid Volume: Goal: Ability to maintain a balanced intake and output will improve Outcome: Progressing    Problem: Health Behavior/Discharge Planning: Goal: Ability to identify and utilize available resources and services will improve Outcome: Progressing Goal: Ability to manage health-related needs will improve Outcome: Progressing   Problem: Metabolic: Goal: Ability to maintain appropriate glucose levels will improve Outcome: Progressing   Problem: Nutritional: Goal: Maintenance of adequate nutrition will improve Outcome: Progressing Goal: Progress toward achieving an optimal weight will improve Outcome: Progressing   Problem: Skin Integrity: Goal: Risk for impaired skin integrity will decrease Outcome: Progressing   Problem: Tissue Perfusion: Goal: Adequacy of tissue perfusion will improve Outcome: Progressing

## 2024-02-21 NOTE — Progress Notes (Addendum)
 I personally performed substantive portion of this visit, including history, exam, labs/imaging/microbiological data review, and decision making. I have formulated independent complex medical decision making as indicated in documentation. Any addendum/correction included   Afebrile Labs with creatinine down to 2.59, WBC down to 13.9, hemoglobin 10.1  Exam - no changes   Plan  Continue Daptomycin  and cefepime   2 sets of repeat blood cultures ordered prior to placement of access for antibiotics Needs blood cultures to be cleared for at least 48 hours Monitor CBC and BMP Universal/standard isolation precautions  Marcus Orem, MD    [facility]  Date of Admission:  02/18/2024      Total days of antibiotics 3   Cefepime   Daptomycin           ASSESSMENT: Marcus Simpson is a 78 y.o. male admitted with:   Pseudomonas Bacteremia -  Thought to be 2/2 GU source with stone, + urine growing same organism with FU manipulation. Continue cefepime  while inpatient. Will consider oral ciprofloxacin  at discharge to complete 10 day course. Repeat blood cultures ordered given we need to replace the PICC line.  - follow repeat BCx drawn today.  - If negative can place new PICC ~Friday  - continue cefepime  for now   MRSE Bacteremia -  Recent admission for MRSE bacteremia and obstructing pyelonephritis s/p left ureteral stent placement 11/16. Refused TEE during admission and planned 6 week course of IV abx. Discharged on Daptomycin  via PICC line for treatment. Re-presented to the ER for hypotension, fever and SIRS syndrome. CT scan with good stent position, no hydronephrosis.  We discussed that we will need to replace PICC to resume course of treatment through planned EOT 12/30.  - FU with Dr. Dennise was cancelled by patient for 12/11. Will reschedule once D/C plan is in place.   Medication Monitoring -  QTc 433 ms.  CK 103  Fluctuating kidney function may make  cefepime  challenging to dose and higher risk for side effects while outpatient     PLAN: - follow repeat BCx drawn today.  - If negative can place new PICC ~Friday  - continue cefepime  for now  - FU with Dr. Dennise was cancelled by patient for 12/11. Will reschedule once D/C plan is in place.   Principal Problem:   Septic shock (HCC) Active Problems:   Sepsis secondary to UTI (HCC)   Hydronephrosis with obstructing calculus   Bacteremia due to methicillin resistant Staphylococcus epidermidis   Bacteremia due to Pseudomonas    acetaminophen   1,000 mg Oral Q8H   aspirin   81 mg Oral Daily   Chlorhexidine  Gluconate Cloth  6 each Topical Daily   cholecalciferol  1,000 Units Oral Daily   vitamin B-12  2,000 mcg Oral Daily   heparin   5,000 Units Subcutaneous Q8H   insulin  aspart  0-15 Units Subcutaneous TID AC & HS   lidocaine   1 patch Transdermal Q24H   melatonin  20 mg Oral QHS   methocarbamol   500 mg Oral TID   metoprolol  succinate  25 mg Oral Daily   [START ON 02/25/2024] rosuvastatin   5 mg Oral Q Sat   tamsulosin   0.4 mg Oral Daily    SUBJECTIVE: Feeling better. Hopeful to go home soon.  No new complaints otherwise   Review of Systems: Review of Systems  Constitutional:  Negative for chills and fever.  Gastrointestinal:  Negative for abdominal pain, diarrhea, nausea and vomiting.  Genitourinary:  Negative for dysuria, frequency and urgency.  Skin:  Negative for rash.     Allergies  Allergen Reactions   Dilaudid [Hydromorphone Hcl] Anaphylaxis and Other (See Comments)    Cardiac arrest    OBJECTIVE: Vitals:   02/21/24 0324 02/21/24 0747 02/21/24 1100 02/21/24 1140  BP: 125/75 (!) 142/86  (!) 162/87  Pulse: 71 70  66  Resp: 20 17  16   Temp: 97.9 F (36.6 C) 97.8 F (36.6 C)  97.9 F (36.6 C)  TempSrc: Oral Oral  Oral  SpO2: 96% 96% 98% 98%  Weight: 107.2 kg     Height:       Body mass index is 32.96 kg/m.  Physical Exam Vitals reviewed.   Constitutional:      Appearance: Normal appearance. He is not ill-appearing.  HENT:     Head: Normocephalic.     Mouth/Throat:     Mouth: Mucous membranes are moist.     Pharynx: Oropharynx is clear.  Eyes:     General: No scleral icterus. Cardiovascular:     Rate and Rhythm: Normal rate.  Pulmonary:     Effort: Pulmonary effort is normal.  Musculoskeletal:        General: Normal range of motion.     Cervical back: Normal range of motion.  Skin:    Coloration: Skin is not jaundiced or pale (.los).  Neurological:     Mental Status: He is alert and oriented to person, place, and time.  Psychiatric:        Mood and Affect: Mood normal.        Judgment: Judgment normal.     Lab Results Lab Results  Component Value Date   WBC 13.9 (H) 02/21/2024   HGB 10.1 (L) 02/21/2024   HCT 31.2 (L) 02/21/2024   MCV 92.6 02/21/2024   PLT 253 02/21/2024    Lab Results  Component Value Date   CREATININE 2.59 (H) 02/21/2024   BUN 39 (H) 02/21/2024   NA 136 02/21/2024   K 4.3 02/21/2024   CL 104 02/21/2024   CO2 23 02/21/2024    Lab Results  Component Value Date   ALT 29 02/18/2024   AST 32 02/18/2024   ALKPHOS 62 02/18/2024   BILITOT 1.3 (H) 02/18/2024     Microbiology: Recent Results (from the past 240 hours)  Resp panel by RT-PCR (RSV, Flu A&B, Covid) Anterior Nasal Swab     Status: None   Collection Time: 02/18/24  6:25 PM   Specimen: Anterior Nasal Swab  Result Value Ref Range Status   SARS Coronavirus 2 by RT PCR NEGATIVE NEGATIVE Final    Comment: (NOTE) SARS-CoV-2 target nucleic acids are NOT DETECTED.  The SARS-CoV-2 RNA is generally detectable in upper respiratory specimens during the acute phase of infection. The lowest concentration of SARS-CoV-2 viral copies this assay can detect is 138 copies/mL. A negative result does not preclude SARS-Cov-2 infection and should not be used as the sole basis for treatment or other patient management decisions. A negative  result may occur with  improper specimen collection/handling, submission of specimen other than nasopharyngeal swab, presence of viral mutation(s) within the areas targeted by this assay, and inadequate number of viral copies(<138 copies/mL). A negative result must be combined with clinical observations, patient history, and epidemiological information. The expected result is Negative.  Fact Sheet for Patients:  bloggercourse.com  Fact Sheet for Healthcare Providers:  seriousbroker.it  This test is no t yet approved or cleared by the [place]  FDA and  has been authorized  for detection and/or diagnosis of SARS-CoV-2 by FDA under an Emergency Use Authorization (EUA). This EUA will remain  in effect (meaning this test can be used) for the duration of the COVID-19 declaration under Section 564(b)(1) of the Act, 21 U.S.C.section 360bbb-3(b)(1), unless the authorization is terminated  or revoked sooner.       Influenza A by PCR NEGATIVE NEGATIVE Final   Influenza B by PCR NEGATIVE NEGATIVE Final    Comment: (NOTE) The Xpert Xpress SARS-CoV-2/FLU/RSV plus assay is intended as an aid in the diagnosis of influenza from Nasopharyngeal swab specimens and should not be used as a sole basis for treatment. Nasal washings and aspirates are unacceptable for Xpert Xpress SARS-CoV-2/FLU/RSV testing.  Fact Sheet for Patients: bloggercourse.com  Fact Sheet for Healthcare Providers: seriousbroker.it  This test is not yet approved or cleared by the [place]  FDA and has been authorized for detection and/or diagnosis of SARS-CoV-2 by FDA under an Emergency Use Authorization (EUA). This EUA will remain in effect (meaning this test can be used) for the duration of the COVID-19 declaration under Section 564(b)(1) of the Act, 21 U.S.C. section 360bbb-3(b)(1), unless the authorization is  terminated or revoked.     Resp Syncytial Virus by PCR NEGATIVE NEGATIVE Final    Comment: (NOTE) Fact Sheet for Patients: bloggercourse.com  Fact Sheet for Healthcare Providers: seriousbroker.it  This test is not yet approved or cleared by the [place]  FDA and has been authorized for detection and/or diagnosis of SARS-CoV-2 by FDA under an Emergency Use Authorization (EUA). This EUA will remain in effect (meaning this test can be used) for the duration of the COVID-19 declaration under Section 564(b)(1) of the Act, 21 U.S.C. section 360bbb-3(b)(1), unless the authorization is terminated or revoked.  Performed at [facility], [address] [place], [place] [address]   Blood Culture (routine x 2)     Status: None (Preliminary result)   Collection Time: 02/18/24  6:25 PM   Specimen: BLOOD  Result Value Ref Range Status   Specimen Description BLOOD LEFT ANTECUBITAL  Final   Special Requests   Final    BOTTLES DRAWN AEROBIC AND ANAEROBIC Blood Culture adequate volume   Culture   Final    NO GROWTH 3 DAYS Performed at [facility], [address] [place], [place] [address]    Report Status PENDING  Incomplete  Blood Culture (routine x 2)     Status: Abnormal   Collection Time: 02/18/24  6:30 PM   Specimen: BLOOD RIGHT ARM  Result Value Ref Range Status   Specimen Description   Final    BLOOD RIGHT ARM Performed at [facility], [address] [place], [place] [address]    Special Requests   Final    BOTTLES DRAWN AEROBIC AND ANAEROBIC Blood Culture adequate volume Performed at [facility], [address] [place], [place] [address]    Culture  Setup Time   Final    AEROBIC BOTTLE ONLY GRAM NEGATIVE RODS Gram Stain Report Called to,Read Back By and Verified With: Y. BALDERAS ON 02/19/2024 @4 :14PM BY T. HAMER  CRITICAL RESULT CALLED TO, READ BACK BY AND VERIFIED WITH: PHARMD J LEDFORD 11//30/2025 @ 2230 BY  AB Performed at [facility], [address] [place], [place] [address]    Culture PSEUDOMONAS AERUGINOSA (A)  Final   Report Status 02/21/2024 FINAL  Final   Organism ID, Bacteria PSEUDOMONAS AERUGINOSA  Final      Susceptibility   Pseudomonas aeruginosa -  MIC*    MEROPENEM 1 SENSITIVE Sensitive     CIPROFLOXACIN  0.12 SENSITIVE Sensitive     IMIPENEM 4 INTERMEDIATE Intermediate     PIP/TAZO Value in next row Sensitive      16 SENSITIVEThis is a modified FDA-approved test that has been validated and its performance characteristics determined by the reporting laboratory.  This laboratory is certified under the Clinical Laboratory Improvement Amendments CLIA as qualified to perform high complexity clinical laboratory testing.    CEFEPIME  Value in next row Sensitive      16 SENSITIVEThis is a modified FDA-approved test that has been validated and its performance characteristics determined by the reporting laboratory.  This laboratory is certified under the Clinical Laboratory Improvement Amendments CLIA as qualified to perform high complexity clinical laboratory testing.    CEFTAZIDIME/AVIBACTAM Value in next row Sensitive      16 SENSITIVEThis is a modified FDA-approved test that has been validated and its performance characteristics determined by the reporting laboratory.  This laboratory is certified under the Clinical Laboratory Improvement Amendments CLIA as qualified to perform high complexity clinical laboratory testing.    CEFTOLOZANE/TAZOBACTAM Value in next row Sensitive      16 SENSITIVEThis is a modified FDA-approved test that has been validated and its performance characteristics determined by the reporting laboratory.  This laboratory is certified under the Clinical Laboratory Improvement Amendments CLIA as qualified to perform high complexity clinical laboratory testing.    TOBRAMYCIN Value in next row Sensitive      16 SENSITIVEThis is a modified FDA-approved test that has  been validated and its performance characteristics determined by the reporting laboratory.  This laboratory is certified under the Clinical Laboratory Improvement Amendments CLIA as qualified to perform high complexity clinical laboratory testing.    CEFTAZIDIME Value in next row Sensitive      16 SENSITIVEThis is a modified FDA-approved test that has been validated and its performance characteristics determined by the reporting laboratory.  This laboratory is certified under the Clinical Laboratory Improvement Amendments CLIA as qualified to perform high complexity clinical laboratory testing.    * PSEUDOMONAS AERUGINOSA  Blood Culture ID Panel (Reflexed)     Status: Abnormal   Collection Time: 02/18/24  6:30 PM  Result Value Ref Range Status   Enterococcus faecalis NOT DETECTED NOT DETECTED Final   Enterococcus Faecium NOT DETECTED NOT DETECTED Final   Listeria monocytogenes NOT DETECTED NOT DETECTED Final   Staphylococcus species NOT DETECTED NOT DETECTED Final   Staphylococcus aureus (BCID) NOT DETECTED NOT DETECTED Final   Staphylococcus epidermidis NOT DETECTED NOT DETECTED Final   Staphylococcus lugdunensis NOT DETECTED NOT DETECTED Final   Streptococcus species NOT DETECTED NOT DETECTED Final   Streptococcus agalactiae NOT DETECTED NOT DETECTED Final   Streptococcus pneumoniae NOT DETECTED NOT DETECTED Final   Streptococcus pyogenes NOT DETECTED NOT DETECTED Final   A.calcoaceticus-baumannii NOT DETECTED NOT DETECTED Final   Bacteroides fragilis NOT DETECTED NOT DETECTED Final   Enterobacterales NOT DETECTED NOT DETECTED Final   Enterobacter cloacae complex NOT DETECTED NOT DETECTED Final   Escherichia coli NOT DETECTED NOT DETECTED Final   Klebsiella aerogenes NOT DETECTED NOT DETECTED Final   Klebsiella oxytoca NOT DETECTED NOT DETECTED Final   Klebsiella pneumoniae NOT DETECTED NOT DETECTED Final   Proteus species NOT DETECTED NOT DETECTED Final   Salmonella species NOT DETECTED  NOT DETECTED Final   Serratia marcescens NOT DETECTED NOT DETECTED Final   Haemophilus influenzae NOT DETECTED NOT DETECTED Final   Neisseria meningitidis  NOT DETECTED NOT DETECTED Final   Pseudomonas aeruginosa DETECTED (A) NOT DETECTED Final    Comment: CRITICAL RESULT CALLED TO, READ BACK BY AND VERIFIED WITH: PHARMD J LEDFORD 11//30/2025 @ 2230 BY AB    Stenotrophomonas maltophilia NOT DETECTED NOT DETECTED Final   Candida albicans NOT DETECTED NOT DETECTED Final   Candida auris NOT DETECTED NOT DETECTED Final   Candida glabrata NOT DETECTED NOT DETECTED Final   Candida krusei NOT DETECTED NOT DETECTED Final   Candida parapsilosis NOT DETECTED NOT DETECTED Final   Candida tropicalis NOT DETECTED NOT DETECTED Final   Cryptococcus neoformans/gattii NOT DETECTED NOT DETECTED Final   CTX-M ESBL NOT DETECTED NOT DETECTED Final   Carbapenem resistance IMP NOT DETECTED NOT DETECTED Final   Carbapenem resistance KPC NOT DETECTED NOT DETECTED Final   Carbapenem resistance NDM NOT DETECTED NOT DETECTED Final   Carbapenem resistance VIM NOT DETECTED NOT DETECTED Final    Comment: Performed at [facility], [address], [place], [place] [address]  MRSA Next Gen by PCR, Nasal     Status: None   Collection Time: 02/18/24  9:57 PM   Specimen: Nasal Mucosa; Nasal Swab  Result Value Ref Range Status   MRSA by PCR Next Gen NOT DETECTED NOT DETECTED Final    Comment: (NOTE) The GeneXpert MRSA Assay (FDA approved for NASAL specimens only), is one component of a comprehensive MRSA colonization surveillance program. It is not intended to diagnose MRSA infection nor to guide or monitor treatment for MRSA infections. Test performance is not FDA approved in patients less than 20 years old. Performed at [facility], [address], [place], [place] [address]   Urine Culture     Status: Abnormal   Collection Time: 02/18/24 10:45 PM   Specimen: Urine, Random  Result Value Ref Range  Status   Specimen Description URINE, RANDOM  Final   Special Requests   Final    NONE Reflexed from [id number] Performed at [facility], [address] [place], [place] [address]    Culture 60,000 COLONIES/mL PSEUDOMONAS AERUGINOSA (A)  Final   Report Status 02/21/2024 FINAL  Final   Organism ID, Bacteria PSEUDOMONAS AERUGINOSA (A)  Final      Susceptibility   Pseudomonas aeruginosa - MIC*    MEROPENEM 1 SENSITIVE Sensitive     CIPROFLOXACIN  0.12 SENSITIVE Sensitive     IMIPENEM 2 SENSITIVE Sensitive     PIP/TAZO Value in next row Sensitive      16 SENSITIVEThis is a modified FDA-approved test that has been validated and its performance characteristics determined by the reporting laboratory.  This laboratory is certified under the Clinical Laboratory Improvement Amendments CLIA as qualified to perform high complexity clinical laboratory testing.    CEFEPIME  Value in next row Sensitive      16 SENSITIVEThis is a modified FDA-approved test that has been validated and its performance characteristics determined by the reporting laboratory.  This laboratory is certified under the Clinical Laboratory Improvement Amendments CLIA as qualified to perform high complexity clinical laboratory testing.    CEFTAZIDIME/AVIBACTAM Value in next row Sensitive      16 SENSITIVEThis is a modified FDA-approved test that has been validated and its performance characteristics determined by the reporting laboratory.  This laboratory is certified under the Clinical Laboratory Improvement Amendments CLIA as qualified to perform high complexity clinical laboratory testing.    CEFTOLOZANE/TAZOBACTAM Value in next row Sensitive      16 SENSITIVEThis is  a modified FDA-approved test that has been validated and its performance characteristics determined by the reporting laboratory.  This laboratory is certified under the Clinical Laboratory Improvement Amendments CLIA as qualified to perform high complexity clinical  laboratory testing.    TOBRAMYCIN Value in next row Sensitive      16 SENSITIVEThis is a modified FDA-approved test that has been validated and its performance characteristics determined by the reporting laboratory.  This laboratory is certified under the Clinical Laboratory Improvement Amendments CLIA as qualified to perform high complexity clinical laboratory testing.    CEFTAZIDIME Value in next row Sensitive      16 SENSITIVEThis is a modified FDA-approved test that has been validated and its performance characteristics determined by the reporting laboratory.  This laboratory is certified under the Clinical Laboratory Improvement Amendments CLIA as qualified to perform high complexity clinical laboratory testing.    * 60,000 COLONIES/mL PSEUDOMONAS AERUGINOSA     Corean Fireman, MSN, NP-C [facility]  [email or web address] Pager: [phone] Office: [phone] [facility] Main Line: [phone] *Secure Chat Communication Welcome

## 2024-02-21 NOTE — Plan of Care (Signed)

## 2024-02-21 NOTE — Progress Notes (Signed)
 PROGRESS NOTE    Marcus Simpson  FMW:[id number] DOB: [birth year] DOA: 02/18/2024 PCP: [name], MD   Brief Narrative:  78 year old male with past medical history significant for HTN, DM2, hx nephrolithiasis, asymptomatic bradycardia, prostate cancer who presented to [facility] 11/29 with c/o weakness, febrile to 102.2, hypotensive 70/50. In the ED he was given 3.5L LR with improvement in BP, cultures drawn, and started empirically on Vanc/Zosyn. Patient was recently admitted November 16-26 for sepsis and discharged on Zyvox  and then placed on 6-week course of daptomycin  via PICC line for presumed IE (patient refused TEE).  Patient was admitted to the ICU for management of septic shock patient was placed on pressors broad-spectrum antibiotics with vancomycin  and Zosyn.  Blood and urine cultures positive for Pseudomonas aeruginosa.  CT abdomen and pelvis shows a stable stent with no hydronephrosis, bladder thickening.  X-ray negative for any acute pathology.  His WBC count has improved from 63,000 during admission to 23.5 today.  Urology and ID following.  Assessment & Plan:   Principal Problem:   Septic shock (HCC)  Septic shock secondary to complicated UTI and Pseudomonas aeruginosa bacteremia, ureteral stone status post ureteral stent, POA: -Recent discharge on 11/24 where he was admitted for sepsis MRSA bacteremia, pyelonephritis obstructing stone, status post stent, discharged with the PICC line on 6 weeks Dapto.  Patient admitted under ICU, required vasopressors.  Urine as well as blood culture both growing Pseudomonas aeruginosa.  Zosyn and vancomycin  transition to daptomycin  and cefepime .  Repeat CT scan shows ureteral stent in good position with stable stone.  No further recommendations by urology, ID following and managing antibiotics.  Repeat blood cultures drawn 02/21/24.  Resume Flomax . - AKI on CKD stage IIIb: Baseline creatinine appears to be around 1.7-1.8 with GFR of around 40.   Creatinine peaked at 2.85 on 02/20/2024, slightly improved to 2.6 today. S/p L ureteral stent 2/2 obstructing stone. Worsening serum creatinine in setting of ureteral stents and coronary to obstructing stone.   Hx prostate ca: Follows with urology.  Hyponatremia: Resolved.  Hypomagnesemia: Resolved.   Hypertension: Controlled, resumed metoprolol .  Hyperlipidemia: Continue Crestor .   Anemia  -follow CBC.  Hemoglobin is stable.   Gout R knee  -effusion tapped last admission, fortunately not c/w septic arthritis.  - Compliant with home colchicine  which we are holding at this time due to rising creatinine.   Diabetes mellitus type II: Recent hemoglobin A1c 6.4 just 2 months ago.  On glipizide at home which is on hold.  Continue SSI, blood sugar controlled.  Switch to consistent carbohydrate diet.  DVT prophylaxis: heparin  injection 5,000 Units Start: 02/18/24 2315 SCDs Start: 02/18/24 2218   Code Status: Full Code  Family Communication:  None present at bedside.  Plan of care discussed with patient in length and he/she verbalized understanding and agreed with it.  Status is: Inpatient Remains inpatient appropriate because: Awaiting final recommendations by ID and renal recovery   Estimated body mass index is 32.96 kg/m as calculated from the following:   Height as of this encounter: 5' 11 (1.803 m).   Weight as of this encounter: 107.2 kg.    Nutritional Assessment: Body mass index is 32.96 kg/m.SABRA Seen by dietician.  I agree with the assessment and plan as outlined below: Nutrition Status:        . Skin Assessment: I have examined the patient's skin and I agree with the wound assessment as performed by the wound care RN as outlined below:  Consultants:  Urology and ID  Procedures:  As above  Antimicrobials:  Anti-infectives (From admission, onward)    Start     Dose/Rate Route Frequency Ordered Stop   02/20/24 2300  vancomycin  (VANCOREADY) IVPB 1500 mg/300 mL   Status:  Discontinued        1,500 mg 150 mL/hr over 120 Minutes Intravenous Every 48 hours 02/18/24 2239 02/20/24 1017   02/20/24 1400  DAPTOmycin  (CUBICIN ) IVPB 700 mg/174mL premix        8 mg/kg  89.5 kg (Adjusted) 200 mL/hr over 30 Minutes Intravenous Every 48 hours 02/20/24 1017     02/20/24 1015  ceFEPIme  (MAXIPIME ) 2 g in sodium chloride  0.9 % 100 mL IVPB        2 g 200 mL/hr over 30 Minutes Intravenous Every 24 hours 02/20/24 0929     02/19/24 0130  piperacillin-tazobactam (ZOSYN) IVPB 3.375 g  Status:  Discontinued        3.375 g 12.5 mL/hr over 240 Minutes Intravenous Every 8 hours 02/18/24 2239 02/20/24 0929   02/18/24 2330  vancomycin  (VANCOREADY) IVPB 1500 mg/300 mL        1,500 mg 150 mL/hr over 120 Minutes Intravenous  Once 02/18/24 2239 02/19/24 0307   02/18/24 1830  cefTRIAXone  (ROCEPHIN ) 2 g in sodium chloride  0.9 % 100 mL IVPB  Status:  Discontinued        2 g 200 mL/hr over 30 Minutes Intravenous Once 02/18/24 1825 02/18/24 1826   02/18/24 1830  vancomycin  (VANCOCIN ) IVPB 1000 mg/200 mL premix        1,000 mg 200 mL/hr over 60 Minutes Intravenous  Once 02/18/24 1826 02/18/24 1956   02/18/24 1830  piperacillin-tazobactam (ZOSYN) IVPB 3.375 g        3.375 g 100 mL/hr over 30 Minutes Intravenous  Once 02/18/24 1826 02/18/24 1907         Subjective: Seen and examined.  Fully alert and oriented.  He has no complaints at all.  States that he has this much of edema at baseline.  Objective: Vitals:   02/20/24 1904 02/20/24 2334 02/21/24 0324 02/21/24 0747  BP: (!) 147/74 (!) 150/73 125/75 (!) 142/86  Pulse: 79 83 71 70  Resp: 20 20 20 17   Temp: 98.7 F (37.1 C) 98.6 F (37 C) 97.9 F (36.6 C) 97.8 F (36.6 C)  TempSrc: Oral Oral Oral Oral  SpO2: 98% 93% 96% 96%  Weight:   107.2 kg   Height:        Intake/Output Summary (Last 24 hours) at 02/21/2024 0910 Last data filed at 02/21/2024 0750 Gross per 24 hour  Intake 676 ml  Output 2590 ml  Net -1914 ml    Filed Weights   02/19/24 0500 02/20/24 0439 02/21/24 0324  Weight: 108.3 kg 110.9 kg 107.2 kg    Examination:  General exam: Appears calm and comfortable  Respiratory system: Clear to auscultation. Respiratory effort normal. Cardiovascular system: S1 & S2 heard, RRR. No JVD, murmurs, rubs, gallops or clicks.  +2 pitting edema bilateral lower extremity. Gastrointestinal system: Abdomen is nondistended, soft and nontender. No organomegaly or masses felt. Normal bowel sounds heard. Central nervous system: Alert and oriented. No focal neurological deficits. Extremities: Symmetric 5 x 5 power. Skin: No rashes, lesions or ulcers Psychiatry: Judgement and insight appear normal. Mood & affect appropriate.    Data Reviewed: I have personally reviewed following labs and imaging studies  CBC: Recent Labs  Lab 02/18/24 1825 02/18/24 2310 02/19/24 0350 02/20/24  0214 02/21/24 0331  WBC 63.9*  --  49.8* 23.5* 13.9*  NEUTROABS 51.5*  --   --   --   --   HGB 11.9* 10.2* 9.8* 9.1* 10.1*  HCT 36.5* 30.0* 29.7* 27.7* 31.2*  MCV 93.1  --  92.5 92.3 92.6  PLT 422*  --  350 264 253   Basic Metabolic Panel: Recent Labs  Lab 02/18/24 1825 02/18/24 2305 02/18/24 2310 02/19/24 0350 02/20/24 0214 02/21/24 0331  NA 138 131* 132* 132* 134* 136  K 3.7 4.6 4.9 5.0 4.1 4.3  CL 106 97*  --  98 103 104  CO2 15* 21*  --  21* 20* 23  GLUCOSE 122* 119*  --  127* 120* 78  BUN 33* 37*  --  38* 43* 39*  CREATININE 2.38* 2.83*  --  2.77* 2.85* 2.59*  CALCIUM  7.3* 7.9*  --  8.4* 7.9* 8.5*  MG  --  1.2*  --  2.3 2.1 2.2  PHOS  --  3.2  --  4.6 4.2 4.0   GFR: Estimated Creatinine Clearance: 28.3 mL/min (A) (by C-G formula based on SCr of 2.59 mg/dL (H)). Liver Function Tests: Recent Labs  Lab 02/18/24 1825  AST 32  ALT 29  ALKPHOS 62  BILITOT 1.3*  PROT 5.9*  ALBUMIN 3.0*   No results for input(s): LIPASE, AMYLASE in the last 168 hours. No results for input(s): AMMONIA in the last  168 hours. Coagulation Profile: Recent Labs  Lab 02/18/24 1825  INR 1.3*   Cardiac Enzymes: Recent Labs  Lab 02/20/24 1017  CKTOTAL 103   BNP (last 3 results) No results for input(s): PROBNP in the last 8760 hours. HbA1C: No results for input(s): HGBA1C in the last 72 hours. CBG: Recent Labs  Lab 02/20/24 1530 02/20/24 1952 02/20/24 2332 02/21/24 0409 02/21/24 0840  GLUCAP 107* 80 90 74 122*   Lipid Profile: No results for input(s): CHOL, HDL, LDLCALC, TRIG, CHOLHDL, LDLDIRECT in the last 72 hours. Thyroid  Function Tests: No results for input(s): TSH, T4TOTAL, FREET4, T3FREE, THYROIDAB in the last 72 hours. Anemia Panel: No results for input(s): VITAMINB12, FOLATE, FERRITIN, TIBC, IRON, RETICCTPCT in the last 72 hours. Sepsis Labs: Recent Labs  Lab 02/18/24 1825 02/18/24 2041 02/18/24 2219 02/19/24 0350  LATICACIDVEN 5.6* 7.2* 5.0* 1.7    Recent Results (from the past 240 hours)  Resp panel by RT-PCR (RSV, Flu A&B, Covid) Anterior Nasal Swab     Status: None   Collection Time: 02/18/24  6:25 PM   Specimen: Anterior Nasal Swab  Result Value Ref Range Status   SARS Coronavirus 2 by RT PCR NEGATIVE NEGATIVE Final    Comment: (NOTE) SARS-CoV-2 target nucleic acids are NOT DETECTED.  The SARS-CoV-2 RNA is generally detectable in upper respiratory specimens during the acute phase of infection. The lowest concentration of SARS-CoV-2 viral copies this assay can detect is 138 copies/mL. A negative result does not preclude SARS-Cov-2 infection and should not be used as the sole basis for treatment or other patient management decisions. A negative result may occur with  improper specimen collection/handling, submission of specimen other than nasopharyngeal swab, presence of viral mutation(s) within the areas targeted by this assay, and inadequate number of viral copies(<138 copies/mL). A negative result must be combined  with clinical observations, patient history, and epidemiological information. The expected result is Negative.  Fact Sheet for Patients:  bloggercourse.com  Fact Sheet for Healthcare Providers:  seriousbroker.it  This test is no t yet approved  or cleared by the [place]  FDA and  has been authorized for detection and/or diagnosis of SARS-CoV-2 by FDA under an Emergency Use Authorization (EUA). This EUA will remain  in effect (meaning this test can be used) for the duration of the COVID-19 declaration under Section 564(b)(1) of the Act, 21 U.S.C.section 360bbb-3(b)(1), unless the authorization is terminated  or revoked sooner.       Influenza A by PCR NEGATIVE NEGATIVE Final   Influenza B by PCR NEGATIVE NEGATIVE Final    Comment: (NOTE) The Xpert Xpress SARS-CoV-2/FLU/RSV plus assay is intended as an aid in the diagnosis of influenza from Nasopharyngeal swab specimens and should not be used as a sole basis for treatment. Nasal washings and aspirates are unacceptable for Xpert Xpress SARS-CoV-2/FLU/RSV testing.  Fact Sheet for Patients: bloggercourse.com  Fact Sheet for Healthcare Providers: seriousbroker.it  This test is not yet approved or cleared by the [place]  FDA and has been authorized for detection and/or diagnosis of SARS-CoV-2 by FDA under an Emergency Use Authorization (EUA). This EUA will remain in effect (meaning this test can be used) for the duration of the COVID-19 declaration under Section 564(b)(1) of the Act, 21 U.S.C. section 360bbb-3(b)(1), unless the authorization is terminated or revoked.     Resp Syncytial Virus by PCR NEGATIVE NEGATIVE Final    Comment: (NOTE) Fact Sheet for Patients: bloggercourse.com  Fact Sheet for Healthcare Providers: seriousbroker.it  This test is not yet approved  or cleared by the [place]  FDA and has been authorized for detection and/or diagnosis of SARS-CoV-2 by FDA under an Emergency Use Authorization (EUA). This EUA will remain in effect (meaning this test can be used) for the duration of the COVID-19 declaration under Section 564(b)(1) of the Act, 21 U.S.C. section 360bbb-3(b)(1), unless the authorization is terminated or revoked.  Performed at [facility], [address], [place], [place] [address]   Blood Culture (routine x 2)     Status: None (Preliminary result)   Collection Time: 02/18/24  6:25 PM   Specimen: BLOOD  Result Value Ref Range Status   Specimen Description BLOOD LEFT ANTECUBITAL  Final   Special Requests   Final    BOTTLES DRAWN AEROBIC AND ANAEROBIC Blood Culture adequate volume   Culture   Final    NO GROWTH 3 DAYS Performed at [facility], [address] [place], [place] [address]    Report Status PENDING  Incomplete  Blood Culture (routine x 2)     Status: Abnormal (Preliminary result)   Collection Time: 02/18/24  6:30 PM   Specimen: BLOOD RIGHT ARM  Result Value Ref Range Status   Specimen Description   Final    BLOOD RIGHT ARM Performed at [facility], [address], [place], [place] [address]    Special Requests   Final    BOTTLES DRAWN AEROBIC AND ANAEROBIC Blood Culture adequate volume Performed at [facility], [address], [place], [place] [address]    Culture  Setup Time   Final    AEROBIC BOTTLE ONLY GRAM NEGATIVE RODS Gram Stain Report Called to,Read Back By and Verified With: Y. BALDERAS ON 02/19/2024 @4 :14PM BY T. HAMER  CRITICAL RESULT CALLED TO, READ BACK BY AND VERIFIED WITH: PHARMD J LEDFORD 11//30/2025 @ 2230 BY AB    Culture (A)  Final    PSEUDOMONAS AERUGINOSA SUSCEPTIBILITIES TO FOLLOW Performed at [facility], [address] [place], [place] [address]    Report Status PENDING  Incomplete  Blood Culture ID Panel (Reflexed)     Status: Abnormal   Collection  Time: 02/18/24  6:30 PM  Result Value Ref Range Status   Enterococcus faecalis NOT DETECTED NOT DETECTED Final   Enterococcus Faecium NOT DETECTED NOT DETECTED Final   Listeria monocytogenes NOT DETECTED NOT DETECTED Final   Staphylococcus species NOT DETECTED NOT DETECTED Final   Staphylococcus aureus (BCID) NOT DETECTED NOT DETECTED Final   Staphylococcus epidermidis NOT DETECTED NOT DETECTED Final   Staphylococcus lugdunensis NOT DETECTED NOT DETECTED Final   Streptococcus species NOT DETECTED NOT DETECTED Final   Streptococcus agalactiae NOT DETECTED NOT DETECTED Final   Streptococcus pneumoniae NOT DETECTED NOT DETECTED Final   Streptococcus pyogenes NOT DETECTED NOT DETECTED Final   A.calcoaceticus-baumannii NOT DETECTED NOT DETECTED Final   Bacteroides fragilis NOT DETECTED NOT DETECTED Final   Enterobacterales NOT DETECTED NOT DETECTED Final   Enterobacter cloacae complex NOT DETECTED NOT DETECTED Final   Escherichia coli NOT DETECTED NOT DETECTED Final   Klebsiella aerogenes NOT DETECTED NOT DETECTED Final   Klebsiella oxytoca NOT DETECTED NOT DETECTED Final   Klebsiella pneumoniae NOT DETECTED NOT DETECTED Final   Proteus species NOT DETECTED NOT DETECTED Final   Salmonella species NOT DETECTED NOT DETECTED Final   Serratia marcescens NOT DETECTED NOT DETECTED Final   Haemophilus influenzae NOT DETECTED NOT DETECTED Final   Neisseria meningitidis NOT DETECTED NOT DETECTED Final   Pseudomonas aeruginosa DETECTED (A) NOT DETECTED Final    Comment: CRITICAL RESULT CALLED TO, READ BACK BY AND VERIFIED WITH: PHARMD J LEDFORD 11//30/2025 @ 2230 BY AB    Stenotrophomonas maltophilia NOT DETECTED NOT DETECTED Final   Candida albicans NOT DETECTED NOT DETECTED Final   Candida auris NOT DETECTED NOT DETECTED Final   Candida glabrata NOT DETECTED NOT DETECTED Final   Candida krusei NOT DETECTED NOT DETECTED Final   Candida parapsilosis NOT DETECTED NOT DETECTED Final   Candida  tropicalis NOT DETECTED NOT DETECTED Final   Cryptococcus neoformans/gattii NOT DETECTED NOT DETECTED Final   CTX-M ESBL NOT DETECTED NOT DETECTED Final   Carbapenem resistance IMP NOT DETECTED NOT DETECTED Final   Carbapenem resistance KPC NOT DETECTED NOT DETECTED Final   Carbapenem resistance NDM NOT DETECTED NOT DETECTED Final   Carbapenem resistance VIM NOT DETECTED NOT DETECTED Final    Comment: Performed at [facility], [address] [place], [place] [address]  MRSA Next Gen by PCR, Nasal     Status: None   Collection Time: 02/18/24  9:57 PM   Specimen: Nasal Mucosa; Nasal Swab  Result Value Ref Range Status   MRSA by PCR Next Gen NOT DETECTED NOT DETECTED Final    Comment: (NOTE) The GeneXpert MRSA Assay (FDA approved for NASAL specimens only), is one component of a comprehensive MRSA colonization surveillance program. It is not intended to diagnose MRSA infection nor to guide or monitor treatment for MRSA infections. Test performance is not FDA approved in patients less than 80 years old. Performed at [facility], [address] [place], [place] [address]   Urine Culture     Status: Abnormal   Collection Time: 02/18/24 10:45 PM   Specimen: Urine, Random  Result Value Ref Range Status   Specimen Description URINE, RANDOM  Final   Special Requests   Final    NONE Reflexed from [id number] Performed at [facility], [address], [place], [place] [address]    Culture 60,000 COLONIES/mL PSEUDOMONAS AERUGINOSA (A)  Final   Report Status 02/21/2024 FINAL  Final   Organism ID, Bacteria PSEUDOMONAS AERUGINOSA (A)  Final      Susceptibility   Pseudomonas aeruginosa - MIC*    MEROPENEM 1 SENSITIVE Sensitive     CIPROFLOXACIN  0.12 SENSITIVE Sensitive     IMIPENEM 2 SENSITIVE Sensitive     PIP/TAZO Value in next row Sensitive      16 SENSITIVEThis is a modified FDA-approved test that has been validated and its performance characteristics determined by the  reporting laboratory.  This laboratory is certified under the Clinical Laboratory Improvement Amendments CLIA as qualified to perform high complexity clinical laboratory testing.    CEFEPIME  Value in next row Sensitive      16 SENSITIVEThis is a modified FDA-approved test that has been validated and its performance characteristics determined by the reporting laboratory.  This laboratory is certified under the Clinical Laboratory Improvement Amendments CLIA as qualified to perform high complexity clinical laboratory testing.    CEFTAZIDIME/AVIBACTAM Value in next row Sensitive      16 SENSITIVEThis is a modified FDA-approved test that has been validated and its performance characteristics determined by the reporting laboratory.  This laboratory is certified under the Clinical Laboratory Improvement Amendments CLIA as qualified to perform high complexity clinical laboratory testing.    CEFTOLOZANE/TAZOBACTAM Value in next row Sensitive      16 SENSITIVEThis is a modified FDA-approved test that has been validated and its performance characteristics determined by the reporting laboratory.  This laboratory is certified under the Clinical Laboratory Improvement Amendments CLIA as qualified to perform high complexity clinical laboratory testing.    TOBRAMYCIN Value in next row Sensitive      16 SENSITIVEThis is a modified FDA-approved test that has been validated and its performance characteristics determined by the reporting laboratory.  This laboratory is certified under the Clinical Laboratory Improvement Amendments CLIA as qualified to perform high complexity clinical laboratory testing.    CEFTAZIDIME Value in next row Sensitive      16 SENSITIVEThis is a modified FDA-approved test that has been validated and its performance characteristics determined by the reporting laboratory.  This laboratory is certified under the Clinical Laboratory Improvement Amendments CLIA as qualified to perform high complexity  clinical laboratory testing.    * 60,000 COLONIES/mL PSEUDOMONAS AERUGINOSA     Radiology Studies: No results found.  Scheduled Meds:  acetaminophen   1,000 mg Oral Q8H   aspirin   81 mg Oral Daily   Chlorhexidine  Gluconate Cloth  6 each Topical Daily   heparin   5,000 Units Subcutaneous Q8H   insulin  aspart  0-15 Units Subcutaneous Q4H   lidocaine   1 patch Transdermal Q24H   methocarbamol   500 mg Oral TID   metoprolol  succinate  25 mg Oral Daily   [START ON 02/25/2024] rosuvastatin   5 mg Oral Q Sat   Continuous Infusions:  ceFEPime  (MAXIPIME ) IV 2 g (02/21/24 0834)   DAPTOmycin  Stopped (02/20/24 1627)     LOS: 3 days   Fredia Skeeter, MD [facility]  02/21/2024, 9:10 AM   *Please note that this is a verbal dictation therefore any spelling or grammatical errors are due to the Dragon Medical One system interpretation.  Please page via Amion and do not message via secure chat for urgent patient care matters. Secure chat can be used for non urgent patient care matters.  How to contact the [facility] Attending or Consulting provider 7A - 7P or covering provider during after hours 7P -7A, for this patient?  Check the care team in [facility] and look for a) attending/consulting [facility] provider listed and b) the [facility] team listed. Page or secure chat 7A-7P. Log into [email or web address] and use [place]'s universal password to access. If you do not have the password, please contact the hospital operator. Locate the [facility] provider you are looking for under [facility] and page to a number that you can be directly reached. If you still have difficulty reaching the provider, please page the [facility] (Director on Call) for the Hospitalists listed on amion for assistance.

## 2024-02-22 DIAGNOSIS — R6521 Severe sepsis with septic shock: Secondary | ICD-10-CM | POA: Diagnosis not present

## 2024-02-22 DIAGNOSIS — A419 Sepsis, unspecified organism: Secondary | ICD-10-CM | POA: Diagnosis not present

## 2024-02-22 LAB — BASIC METABOLIC PANEL WITH GFR
Anion gap: 15 (ref 5–15)
BUN: 33 mg/dL — ABNORMAL HIGH (ref 8–23)
CO2: 24 mmol/L (ref 22–32)
Calcium: 9 mg/dL (ref 8.9–10.3)
Chloride: 100 mmol/L (ref 98–111)
Creatinine, Ser: 2.21 mg/dL — ABNORMAL HIGH (ref 0.61–1.24)
GFR, Estimated: 29 mL/min — ABNORMAL LOW (ref >60)
Glucose, Bld: 97 mg/dL (ref 70–99)
Potassium: 4.3 mmol/L (ref 3.5–5.1)
Sodium: 139 mmol/L (ref 135–145)

## 2024-02-22 LAB — GLUCOSE, CAPILLARY
Glucose-Capillary: 97 mg/dL (ref 70–99)
Glucose-Capillary: 94 mg/dL (ref 70–99)
Glucose-Capillary: 91 mg/dL (ref 70–99)
Glucose-Capillary: 99 mg/dL (ref 70–99)

## 2024-02-22 LAB — CBC
HCT: 30 % — ABNORMAL LOW (ref 39.0–52.0)
Hemoglobin: 9.9 g/dL — ABNORMAL LOW (ref 13.0–17.0)
MCH: 30.3 pg (ref 26.0–34.0)
MCHC: 33 g/dL (ref 30.0–36.0)
MCV: 91.7 fL (ref 80.0–100.0)
Platelets: 257 K/uL (ref 150–400)
RBC: 3.27 MIL/uL — ABNORMAL LOW (ref 4.22–5.81)
RDW: 13.7 % (ref 11.5–15.5)
WBC: 11.3 K/uL — ABNORMAL HIGH (ref 4.0–10.5)
nRBC: 0 % (ref 0.0–0.2)

## 2024-02-22 LAB — MAGNESIUM: Magnesium: 1.9 mg/dL (ref 1.7–2.4)

## 2024-02-22 LAB — PHOSPHORUS: Phosphorus: 4.2 mg/dL (ref 2.5–4.6)

## 2024-02-22 MED ORDER — SODIUM CHLORIDE 0.9 % IV SOLN
2.0000 g | Freq: Two times a day (BID) | INTRAVENOUS | Status: DC
  Administered 2024-02-22 (×2): 2 g via INTRAVENOUS
  Filled 2024-02-22 (×2): qty 12.5

## 2024-02-22 MED ORDER — SODIUM CHLORIDE 0.9 % IV SOLN: INTRAVENOUS | Status: AC

## 2024-02-22 MED ORDER — DAPTOMYCIN-SODIUM CHLORIDE 700-0.9 MG/100ML-% IV SOLN
8.0000 mg/kg | Freq: Every day | INTRAVENOUS | Status: DC
  Administered 2024-02-22 – 2024-02-23 (×2): 700 mg via INTRAVENOUS
  Filled 2024-02-22 (×2): qty 100

## 2024-02-22 NOTE — Progress Notes (Incomplete)
 PHARMACY CONSULT NOTE FOR:  OUTPATIENT  PARENTERAL ANTIBIOTIC THERAPY (OPAT)  Indication: MRSE bacteremia Regimen: Daptomycin  700 mg IV every 24 hours End date: 03/20/24  The patient will also continue on oral Ciprofloxacin  750 mg po bid thru 12/13 for PsA urosepsis  IV antibiotic discharge orders are pended. To discharging provider:  please sign these orders via discharge navigator,  Select New Orders & click on the button choice - Manage This Unsigned Work.     Thank you for allowing pharmacy to be a part of this patient's care.  Almarie Lunger, PharmD, BCPS, BCIDP Infectious Diseases Clinical Pharmacist 02/23/2024 8:36 AM   **Pharmacist phone directory can now be found on [email or web address] ([email or web address]).  Listed under [facility].

## 2024-02-22 NOTE — Care Management Important Message (Signed)
 Important Message  Patient Details  Name: Marcus Simpson MRN: [id number] Date of Birth: Nov 23, 1945   Important Message Given:  Yes - Medicare IM     Claretta Deed 02/22/2024, 2:43 PM

## 2024-02-22 NOTE — Plan of Care (Signed)
  Problem: Education: Goal: Knowledge of General Education information will improve Description: Including pain rating scale, medication(s)/side effects and non-pharmacologic comfort measures Outcome: Progressing   Problem: Health Behavior/Discharge Planning: Goal: Ability to manage health-related needs will improve Outcome: Progressing   Problem: Clinical Measurements: Goal: Diagnostic test results will improve Outcome: Progressing   Problem: Nutrition: Goal: Adequate nutrition will be maintained Outcome: Progressing   Problem: Elimination: Goal: Will not experience complications related to urinary retention Outcome: Progressing

## 2024-02-22 NOTE — Progress Notes (Signed)
     Subjective: NAEON. Pt resting in bed. Discussed eventual treatment with Dr. Sherrilee  Objective: Vital signs in last 24 hours: Temp:  [97.1 F (36.2 C)-98.2 F (36.8 C)] 97.1 F (36.2 C) (12/03 0241) Pulse Rate:  [66-93] 93 (12/03 0241) Resp:  [16-18] 18 (12/03 0241) BP: (142-167)/(67-87) 147/74 (12/03 0241) SpO2:  [95 %-98 %] 96 % (12/03 0241) Weight:  [104.8 kg] 104.8 kg (12/03 0241)  Assessment/Plan: #Pseudomonas UTI #Pseudomonas bacteremia  ABX per ID. Repeat UA prior to definitive stone mgmt. Ideally schedule while on ABX. Will follow up with [facility]. Remains afebrile. Voiding trial first thing in the am at any point.  Stent is well positioned. No interventions at this time Urology will sign off. Please call with questions.   Intake/Output from previous day: 12/02 0701 - 12/03 0700 In: 816 [P.O.:716; IV Piggyback:100] Out: 2650 [Urine:2650]  Intake/Output this shift: No intake/output data recorded.  Physical Exam:  General: Alert and oriented CV: No cyanosis Lungs: equal chest rise Abdomen: Soft, NTND, no rebound or guarding Gu: Foley catheter in place draining clear yellow urine  Lab Results: Recent Labs    02/20/24 0214 02/21/24 0331 02/22/24 0508  HGB 9.1* 10.1* 9.9*  HCT 27.7* 31.2* 30.0*   BMET Recent Labs    02/21/24 0331 02/22/24 0508  NA 136 139  K 4.3 4.3  CL 104 100  CO2 23 24  GLUCOSE 78 97  BUN 39* 33*  CREATININE 2.59* 2.21*  CALCIUM  8.5* 9.0  HGB 10.1* 9.9*  WBC 13.9* 11.3*     Studies/Results: No results found.    LOS: 4 days   Ole Bourdon, NP [name] Pager: [phone]  02/22/2024, 7:37 AM

## 2024-02-22 NOTE — Plan of Care (Signed)
 Problem: Education: Goal: Knowledge of General Education information will improve Description: Including pain rating scale, medication(s)/side effects and non-pharmacologic comfort measures 02/22/2024 2036 by Lilian Glenys SAUNDERS, RN Outcome: Progressing 02/22/2024 2036 by Lilian Glenys SAUNDERS, RN Outcome: Progressing   Problem: Health Behavior/Discharge Planning: Goal: Ability to manage health-related needs will improve 02/22/2024 2036 by Marimar Suber, Glenys SAUNDERS, RN Outcome: Progressing 02/22/2024 2036 by Lilian Glenys SAUNDERS, RN Outcome: Progressing   Problem: Clinical Measurements: Goal: Ability to maintain clinical measurements within normal limits will improve 02/22/2024 2036 by Julene Rahn, Glenys SAUNDERS, RN Outcome: Progressing 02/22/2024 2036 by Lilian Glenys SAUNDERS, RN Outcome: Progressing Goal: Will remain free from infection 02/22/2024 2036 by Lilian Glenys SAUNDERS, RN Outcome: Progressing 02/22/2024 2036 by Lilian Glenys SAUNDERS, RN Outcome: Progressing Goal: Diagnostic test results will improve 02/22/2024 2036 by Lilian Glenys SAUNDERS, RN Outcome: Progressing 02/22/2024 2036 by Lilian Glenys SAUNDERS, RN Outcome: Progressing Goal: Respiratory complications will improve 02/22/2024 2036 by Lilian Glenys SAUNDERS, RN Outcome: Progressing 02/22/2024 2036 by Lilian Glenys SAUNDERS, RN Outcome: Progressing Goal: Cardiovascular complication will be avoided 02/22/2024 2036 by Lilian Glenys SAUNDERS, RN Outcome: Progressing 02/22/2024 2036 by Lilian Glenys SAUNDERS, RN Outcome: Progressing   Problem: Activity: Goal: Risk for activity intolerance will decrease 02/22/2024 2036 by Kenya Kook, Glenys SAUNDERS, RN Outcome: Progressing 02/22/2024 2036 by Lilian Glenys SAUNDERS, RN Outcome: Progressing   Problem: Nutrition: Goal: Adequate nutrition will be maintained 02/22/2024 2036 by Lilian Glenys SAUNDERS, RN Outcome: Progressing 02/22/2024 2036 by Lilian Glenys SAUNDERS, RN Outcome: Progressing   Problem: Coping: Goal: Level of anxiety will  decrease 02/22/2024 2036 by Jimesha Rising, Glenys SAUNDERS, RN Outcome: Progressing 02/22/2024 2036 by Lilian Glenys SAUNDERS, RN Outcome: Progressing   Problem: Elimination: Goal: Will not experience complications related to bowel motility 02/22/2024 2036 by Lilian Glenys SAUNDERS, RN Outcome: Progressing 02/22/2024 2036 by Lilian Glenys SAUNDERS, RN Outcome: Progressing Goal: Will not experience complications related to urinary retention 02/22/2024 2036 by Alethea Terhaar, Glenys SAUNDERS, RN Outcome: Progressing 02/22/2024 2036 by Lilian Glenys SAUNDERS, RN Outcome: Progressing   Problem: Pain Managment: Goal: General experience of comfort will improve and/or be controlled 02/22/2024 2036 by Akshitha Culmer, Glenys SAUNDERS, RN Outcome: Progressing 02/22/2024 2036 by Lilian Glenys SAUNDERS, RN Outcome: Progressing   Problem: Safety: Goal: Ability to remain free from injury will improve 02/22/2024 2036 by Homar Weinkauf, Glenys SAUNDERS, RN Outcome: Progressing 02/22/2024 2036 by Lilian Glenys SAUNDERS, RN Outcome: Progressing   Problem: Skin Integrity: Goal: Risk for impaired skin integrity will decrease 02/22/2024 2036 by Olamide Carattini, Glenys SAUNDERS, RN Outcome: Progressing 02/22/2024 2036 by Lilian Glenys SAUNDERS, RN Outcome: Progressing   Problem: Education: Goal: Ability to describe self-care measures that may prevent or decrease complications (Diabetes Survival Skills Education) will improve 02/22/2024 2036 by Lilian Glenys SAUNDERS, RN Outcome: Progressing 02/22/2024 2036 by Lilian Glenys SAUNDERS, RN Outcome: Progressing Goal: Individualized Educational Video(s) 02/22/2024 2036 by Lilian Glenys SAUNDERS, RN Outcome: Progressing 02/22/2024 2036 by Lilian Glenys SAUNDERS, RN Outcome: Progressing   Problem: Coping: Goal: Ability to adjust to condition or change in health will improve 02/22/2024 2036 by Tilton Marsalis, Glenys SAUNDERS, RN Outcome: Progressing 02/22/2024 2036 by Lilian Glenys SAUNDERS, RN Outcome: Progressing   Problem: Fluid Volume: Goal: Ability to maintain a balanced  intake and output will improve 02/22/2024 2036 by Dhillon Comunale, Glenys SAUNDERS, RN Outcome: Progressing 02/22/2024 2036 by Lilian Glenys SAUNDERS, RN Outcome: Progressing   Problem: Health Behavior/Discharge Planning: Goal: Ability to identify and utilize available resources and services will improve 02/22/2024 2036 by Lilian Glenys SAUNDERS, RN Outcome: Progressing 02/22/2024 2036 by  Owen Pratte, Glenys SAUNDERS, RN Outcome: Progressing Goal: Ability to manage health-related needs will improve 02/22/2024 2036 by Kattie Santoyo, Glenys SAUNDERS, RN Outcome: Progressing 02/22/2024 2036 by Lilian Glenys SAUNDERS, RN Outcome: Progressing   Problem: Metabolic: Goal: Ability to maintain appropriate glucose levels will improve 02/22/2024 2036 by Rapheal Masso, Glenys SAUNDERS, RN Outcome: Progressing 02/22/2024 2036 by Lilian Glenys SAUNDERS, RN Outcome: Progressing   Problem: Nutritional: Goal: Maintenance of adequate nutrition will improve 02/22/2024 2036 by Lilian Glenys SAUNDERS, RN Outcome: Progressing 02/22/2024 2036 by Lilian Glenys SAUNDERS, RN Outcome: Progressing Goal: Progress toward achieving an optimal weight will improve 02/22/2024 2036 by Lilian Glenys SAUNDERS, RN Outcome: Progressing 02/22/2024 2036 by Lilian Glenys SAUNDERS, RN Outcome: Progressing   Problem: Skin Integrity: Goal: Risk for impaired skin integrity will decrease 02/22/2024 2036 by Lilian Glenys SAUNDERS, RN Outcome: Progressing 02/22/2024 2036 by Lilian Glenys SAUNDERS, RN Outcome: Progressing

## 2024-02-22 NOTE — Progress Notes (Signed)
 Mobility Specialist Progress Note:    02/22/24 1100  Mobility  Activity Ambulated with assistance  Level of Assistance Contact guard assist, steadying assist  Assistive Device Front wheel walker  Distance Ambulated (ft) 100 ft  Activity Response Tolerated well  Mobility Referral Yes  Mobility visit 1 Mobility  Mobility Specialist Start Time (ACUTE ONLY) 0815  Mobility Specialist Stop Time (ACUTE ONLY) 0830  Mobility Specialist Time Calculation (min) (ACUTE ONLY) 15 min   Received pt in chair having no complaints and agreeable to mobility. Pt was asymptomatic throughout ambulation and returned to room w/o fault. Left in chair w/ call bell in reach and all needs met.   Thersia Minder Mobility Specialist  Please contact vis Secure Chat or  Rehab Office [phone]

## 2024-02-22 NOTE — Progress Notes (Signed)
 Pharmacy Antibiotic Note  Marcus Simpson is a 78 y.o. male admitted on 02/18/2024 with Pseudomonas urosepsis.  Pharmacy has been consulted for Daptomycin  + Cefepime  dosing.   The patient had a recent MRSE bacteremia at [facility] in November - did not pursue TEE so plan was for Daptomycin  thru 03/20/24. CK 103  Renal function has improved with SCr down to 2.21 (noted down to 1.6-1.8 last admission), CrCl~30-35 ml/min - anticipate additional improvements.  Plan: - Adjust Cefepime  to 2g IV every 12 hours - Adjust Daptomycin  to 700 mg IV every 24 hours - Will continue to follow renal function, culture results, and LOT plans  Height: 5' 11 (180.3 cm) Weight: 104.8 kg (231 lb 0.7 oz) IBW/kg (Calculated) : 75.3  Temp (24hrs), Avg:97.8 F (36.6 C), Min:97.1 F (36.2 C), Max:98.2 F (36.8 C)  Recent Labs  Lab 02/18/24 1825 02/18/24 2041 02/18/24 2219 02/18/24 2305 02/19/24 0350 02/20/24 0214 02/21/24 0331 02/22/24 0508  WBC 63.9*  --   --   --  49.8* 23.5* 13.9* 11.3*  CREATININE 2.38*  --   --  2.83* 2.77* 2.85* 2.59* 2.21*  LATICACIDVEN 5.6* 7.2* 5.0*  --  1.7  --   --   --     Estimated Creatinine Clearance: 32.8 mL/min (A) (by C-G formula based on SCr of 2.21 mg/dL (H)).    Allergies  Allergen Reactions   Dilaudid [Hydromorphone Hcl] Anaphylaxis and Other (See Comments)    Cardiac arrest    Antimicrobials this admission: Daptomycin  PTA >> (12/30) Zosyn 11/29 >> 12/1 Vancomycin  11/29 >> 11/30  Dose adjustments this admission:   Microbiology results: 11/29 BCx >> 1/4 PsA 11/29 UCx >> 60k PsA  Thank you for allowing pharmacy to be a part of this patient's care.  Almarie Lunger, PharmD, BCPS, BCIDP Infectious Diseases Clinical Pharmacist 02/22/2024 7:37 AM   **Pharmacist phone directory can now be found on [email or web address] ([email or web address]).  Listed under [facility].

## 2024-02-22 NOTE — Discharge Summary (Signed)
 Physician Discharge Summary  Marcus Simpson FMW:[id number] DOB: 1945-10-20 DOA: 02/18/2024  PCP: [name], MD  Admit date: 02/18/2024 Discharge date: 02/23/2024 30 Day Unplanned Readmission Risk Score    Flowsheet Row ED to Hosp-Admission (Current) from 02/18/2024 in [facility]GRESSIVE CARE  30 Day Unplanned Readmission Risk Score (%) 20.05 Filed at 02/22/2024 1601    This score is the patient's risk of an unplanned readmission within 30 days of being discharged (0 -100%). The score is based on dignosis, age, lab data, medications, orders, and past utilization.   Low:  0-14.9   Medium: 15-21.9   High: 22-29.9   Extreme: 30 and above          Admitted From: Home Disposition: Home  Recommendations for Outpatient Follow-up:  Follow up with PCP in 1-2 weeks Please obtain BMP/CBC in one week Please follow up with your PCP on the following pending results: Unresulted Labs (From admission, onward)     Start     Ordered   02/27/24 0500  CK  Every Monday,   R     Question:  Specimen collection method  Answer:  Unit=Unit collect   02/20/24 1354              Home Health: Yes Equipment/Devices: PICC line  Discharge Condition:Stable CODE STATUS: Full code Diet recommendation:  Diet Order             Diet heart healthy/carb modified Room service appropriate? Yes; Fluid consistency: Thin  Diet effective now                   Subjective: Seen and examined, he has no complaints at all.  Brief/Interim Summary: 78 year old male with past medical history significant for HTN, DM2, hx nephrolithiasis, asymptomatic bradycardia, prostate cancer who presented to [facility] ED 11/29 with c/o weakness, febrile to 102.2, hypotensive 70/50. In the ED he was given 3.5L LR with improvement in BP, cultures drawn, and started empirically on Vanc/Zosyn. Patient was recently admitted November 16-26 for sepsis and discharged on Zyvox  and then placed on 6-week course of daptomycin  via  PICC line for presumed IE (patient refused TEE).  Patient was admitted to the ICU for management of septic shock, was placed on pressors broad-spectrum antibiotics with vancomycin  and Zosyn.  Blood and urine cultures positive for Pseudomonas aeruginosa.  CT abdomen and pelvis shows a stable stent with no hydronephrosis, bladder thickening.  X-ray negative for any acute pathology.  His WBC count has improved from 63,000 down to 23.5 initially.  Urology was also on board.  Details below.   Septic shock secondary to complicated UTI and Pseudomonas aeruginosa bacteremia, ureteral stone status post ureteral stent, POA: -Recent discharge on 11/24 where he was admitted for sepsis MRSA bacteremia, pyelonephritis obstructing stone, status post stent, discharged with the PICC line on 6 weeks Dapto.  Patient admitted under ICU, required vasopressors.  Urine as well as blood culture both growing Pseudomonas aeruginosa.  Zosyn and vancomycin  transitioned to daptomycin  and cefepime .  Repeat CT scan shows ureteral stent in good position with stable stone.  No further recommendations by urology and they signed off.  Repeat blood cultures drawn 02/21/24 and negative.  Continue Flomax .  Foley catheter was removed last night, he has had successful voiding several times since then.  He is now going home with previously prescribed IV daptomycin  to complete course for endocarditis and going home with p.o. ciprofloxacin  to complete total of 2-week course for  Pseudomonas aeruginosa bacteremia.  ID will follow-up with him as outpatient. - AKI on CKD stage IIIb: Baseline creatinine appears to be around 1.7-1.8 with GFR of around 40.  Creatinine peaked at 2.85 on 02/20/2024, continues to improve and down to 1.8 today, s/p L ureteral stent 2/2 obstructing stone.  AKI likely in setting of ureteral stents and coronary to obstructing stone.  Urology signed off.   Hx prostate ca: Follows with urology.   Hyponatremia: Resolved.    Hypomagnesemia: Resolved.   Hypertension: Controlled, resumed metoprolol .   Hyperlipidemia: Continue Crestor .   Anemia  -follow CBC.  Hemoglobin is stable.   Gout R knee  -effusion tapped last admission, fortunately not c/w septic arthritis.  - Compliant with home colchicine  which was held due to rising creatinine but he can resume them at home.   Diabetes mellitus type II: Recent hemoglobin A1c 6.4 just 2 months ago.  On glipizide at home which he will resume.  He was placed on SSI here.   Discharge Diagnoses:  Principal Problem:   Septic shock (HCC) Active Problems:   Sepsis secondary to UTI (HCC)   Hydronephrosis with obstructing calculus   Bacteremia due to methicillin resistant Staphylococcus epidermidis   Bacteremia due to Pseudomonas    Discharge Instructions  Discharge Instructions     [facility] pharmacist to adjust dose for Vancomycin , Aminoglycosides and other anti-infective therapies as requested by physician.   Complete by: As directed    [facility] to provide Cath Flo 2mg    Complete by: As directed    Administer for PICC line occlusion and as ordered by physician for other access device issues.   Anaphylaxis Kit: Provided to treat any anaphylactic reaction to the medication being provided to the patient if First Dose or when requested by physician   Complete by: As directed    Epinephrine  1mg /ml vial / amp: Administer 0.3mg  (0.31ml) subcutaneously once for moderate to severe anaphylaxis, nurse to call physician and pharmacy when reaction occurs and call 911 if needed for immediate care   Diphenhydramine  50mg /ml IV vial: Administer 25-50mg  IV/IM PRN for first dose reaction, rash, itching, mild reaction, nurse to call physician and pharmacy when reaction occurs   Sodium Chloride  0.9% NS 500ml IV: Administer if needed for hypovolemic blood pressure drop or as ordered by physician after call to physician with anaphylactic reaction   Change  dressing on IV access line weekly and PRN   Complete by: As directed    Flush IV access with Sodium Chloride  0.9% and Heparin  10 units/ml or 100 units/ml   Complete by: As directed    Home infusion instructions - [facility]   Complete by: As directed    Instructions: Flush IV access with Sodium Chloride  0.9% and Heparin  10units/ml or 100units/ml   Change dressing on IV access line: Weekly and PRN   Instructions Cath Flo 2mg : Administer for PICC Line occlusion and as ordered by physician for other access device   [facility] pharmacist to adjust dose for: Vancomycin , Aminoglycosides and other anti-infective therapies as requested by physician   Method of administration may be changed at the discretion of home infusion pharmacist based upon assessment of the patient and/or caregiver's ability to self-administer the medication ordered   Complete by: As directed       Allergies as of 02/23/2024       Reactions   Dilaudid [hydromorphone Hcl] Anaphylaxis, Other (See Comments)   Cardiac arrest  Medication List     PAUSE taking these medications    indomethacin 25 MG capsule Wait to take this until your doctor or other care provider tells you to start again. Commonly known as: INDOCIN Take 25 mg by mouth 2 (two) times daily with a meal.       STOP taking these medications    nitrofurantoin  (macrocrystal-monohydrate) 100 MG capsule Commonly known as: MACROBID        TAKE these medications    amLODipine  5 MG tablet Commonly known as: NORVASC  Take 1 tablet (5 mg total) by mouth daily.   aspirin  EC 81 MG tablet Take 81 mg by mouth daily.   ciprofloxacin  750 MG tablet Commonly known as: CIPRO  Take 1 tablet (750 mg total) by mouth 2 (two) times daily for 9 days.   colchicine  0.6 MG tablet Take 1 tablet (0.6 mg total) by mouth 2 (two) times daily for 10 days.   CRANBERRY PO Take 2 capsules by mouth daily.   daptomycin  IVPB Commonly known  as: CUBICIN  Inject 700 mg into the vein daily for 26 days. Indication:  MRSE bacteremia First Dose: Yes Last Day of Therapy:  03/20/24 Labs - Once weekly:  CBC/D, BMP, and CPK Labs - Once weekly: ESR and CRP Method of administration: IV Push Method of administration may be changed at the discretion of home infusion pharmacist based upon assessment of the patient and/or caregiver's ability to self-administer the medication ordered.   glipiZIDE 2.5 MG 24 hr tablet Commonly known as: GLUCOTROL XL Take 2.5 mg by mouth daily.   heparin  100 units/mL Soln by Dialysis route daily. 1 dose of an unknown amount   ibuprofen 200 MG tablet Commonly known as: ADVIL Take 400 mg by mouth every 6 (six) hours as needed for headache (pain).   Lancets Misc 1 each by Does not apply route as directed. Dispense based on patient and insurance preference. Use up to four times daily as directed. (FOR ICD-10 E10.9, E11.9).   meclizine  25 MG tablet Commonly known as: ANTIVERT  Take 25 mg by mouth 2 (two) times daily.   Melatonin 10 MG Chew Chew 20 mg by mouth at bedtime.   metoprolol  succinate 25 MG 24 hr tablet Commonly known as: TOPROL -XL Take 25 mg by mouth in the morning and at bedtime.   OneTouch Ultra Test test strip Generic drug: glucose blood 1 each by Other route daily.   BLOOD GLUCOSE TEST STRIPS Strp 1 each by Does not apply route as directed. Dispense based on patient and insurance preference. Use up to four times daily as directed. (FOR ICD-10 E10.9, E11.9).   OVER THE COUNTER MEDICATION Take 1 capsule by mouth daily. OTC Nervive; nerve relief.   OVER THE COUNTER MEDICATION Take 2 tablets by mouth daily. OTC Joint and Muscle support.   oxyCODONE -acetaminophen  5-325 MG tablet Commonly known as: PERCOCET/ROXICET Take 1 tablet by mouth every 6 (six) hours as needed for severe pain (pain score 7-10).   rosuvastatin  5 MG tablet Commonly known as: CRESTOR  Take 5 mg by mouth every  Saturday.   tamsulosin  0.4 MG Caps capsule Commonly known as: FLOMAX  Take 1 capsule (0.4 mg total) by mouth daily.   traMADol  50 MG tablet Commonly known as: ULTRAM  Take 50 mg by mouth 3 (three) times daily as needed for moderate pain (pain score 4-6).   TURMERIC CURCUMIN PO Take 1 tablet by mouth daily. OTC Ultra-absorbable curcumin.   URISTAT ULTRA PO Take 1 tablet by mouth 2 (two) times  daily as needed (urinary pain).   Vitamin B-12 2500 MCG Subl Place 2,500 mcg under the tongue daily.   VITAMIN D-3 PO Take 1 capsule by mouth daily.               Discharge Care Instructions  (From admission, onward)           Start     Ordered   02/23/24 0000  Change dressing on IV access line weekly and PRN  (Home infusion instructions - [facility] )        02/23/24 0909            Follow-up Information     Vyas, Leta B, MD Follow up in 1 week(s).   Specialty: Internal Medicine Contact information: [address] [place] [address] [phone]                Allergies  Allergen Reactions   Dilaudid [Hydromorphone Hcl] Anaphylaxis and Other (See Comments)    Cardiac arrest    Consultations: Urology and ID   Procedures/Studies: ECHOCARDIOGRAM LIMITED Result Date: 02/19/2024    ECHOCARDIOGRAM LIMITED REPORT   Patient Name:   Marcus Simpson Date of Exam: 02/19/2024 Medical Rec #:  [id number]         Height:       71.0 in Accession #:    [id number]        Weight:       238.8 lb Date of Birth:  [birth year]          BSA:          2.273 m Patient Age:    78 years          BP:           121/52 mmHg Patient Gender: M                 HR:           70 bpm. Exam Location:  Inpatient Procedure: Limited Echo, Limited Color Doppler and Cardiac Doppler (Both            Spectral and Color Flow Doppler were utilized during procedure). Indications:    Sepsis  History:        Patient has prior history of Echocardiogram examinations, most                 recent  02/07/2024. Risk Factors:Hypertension, Diabetes and                 Dyslipidemia.  Sonographer:    Tinnie Barefoot RDCS Referring Phys: [id number] [facility] IMPRESSIONS  1. Left ventricular ejection fraction, by estimation, is 60 to 65%. The left ventricle has normal function. The left ventricle has no regional wall motion abnormalities.  2. Right ventricular systolic function is normal. The right ventricular size is normal. There is normal pulmonary artery systolic pressure. The estimated right ventricular systolic pressure is 28.0 mmHg.  3. The mitral valve is normal in structure. Trivial mitral valve regurgitation. No evidence of mitral stenosis.  4. The aortic valve is tricuspid. Aortic valve regurgitation is not visualized. Aortic valve sclerosis is present, with no evidence of aortic valve stenosis.  5. The inferior vena cava is normal in size with greater than 50% respiratory variability, suggesting right atrial pressure of 3 mmHg. FINDINGS  Left Ventricle: Left ventricular ejection fraction, by estimation, is 60 to 65%. The left ventricle has normal function. The left ventricle has no regional wall  motion abnormalities. The left ventricular internal cavity size was normal in size. There is  no left ventricular hypertrophy. Right Ventricle: The right ventricular size is normal. No increase in right ventricular wall thickness. Right ventricular systolic function is normal. There is normal pulmonary artery systolic pressure. The tricuspid regurgitant velocity is 2.50 m/s, and  with an assumed right atrial pressure of 3 mmHg, the estimated right ventricular systolic pressure is 28.0 mmHg. Left Atrium: Left atrial size was normal in size. Right Atrium: Right atrial size was normal in size. Pericardium: There is no evidence of pericardial effusion. Mitral Valve: The mitral valve is normal in structure. Trivial mitral valve regurgitation. No evidence of mitral valve stenosis. Tricuspid Valve: The tricuspid  valve is normal in structure. Tricuspid valve regurgitation is trivial. No evidence of tricuspid stenosis. Aortic Valve: The aortic valve is tricuspid. Aortic valve regurgitation is not visualized. Aortic valve sclerosis is present, with no evidence of aortic valve stenosis. Pulmonic Valve: The pulmonic valve was normal in structure. Pulmonic valve regurgitation is trivial. No evidence of pulmonic stenosis. Aorta: The aortic root is normal in size and structure. Venous: The inferior vena cava is normal in size with greater than 50% respiratory variability, suggesting right atrial pressure of 3 mmHg. IAS/Shunts: No atrial level shunt detected by color flow Doppler. Additional Comments: Spectral Doppler performed. Color Doppler performed.  LEFT VENTRICLE PLAX 2D LVIDd:         3.70 cm LVIDs:         2.40 cm LV PW:         1.00 cm LV IVS:        1.10 cm  IVC IVC diam: 1.90 cm LEFT ATRIUM         Index LA diam:    3.60 cm 1.58 cm/m  AORTIC VALVE LVOT Vmax:   97.60 cm/s LVOT Vmean:  65.300 cm/s LVOT VTI:    0.233 m MITRAL VALVE               TRICUSPID VALVE MV Area (PHT): 2.34 cm    TR Peak grad:   25.0 mmHg MV Decel Time: 324 msec    TR Vmax:        250.00 cm/s MV E velocity: 72.40 cm/s MV A velocity: 69.70 cm/s  SHUNTS MV E/A ratio:  1.04        Systemic VTI: 0.23 m Wilbert Bihari MD Electronically signed by Wilbert Bihari MD Signature Date/Time: 02/19/2024/11:15:39 AM    Final    DG Abd 1 View Result Date: 02/18/2024 EXAM: 1 VIEW XRAY OF THE ABDOMEN 02/18/2024 10:58:00 PM COMPARISON: 02/18/2024 CLINICAL HISTORY: Abdominal distention FINDINGS: BOWEL: Nonobstructive bowel gas pattern. SOFT TISSUES: Left ureteral stent in place. Prostate brachytherapy seeds noted. 7 mm left mid ureteral calculus adjacent to the stent. 9 mm calculus projects over the right kidney. BONES: No acute osseous abnormality. IMPRESSION: 1. 7 mm left mid ureteral calculus adjacent to the stent. 2. 9 mm calculus projects over the right kidney.  Electronically signed by: Norman Gatlin MD 02/18/2024 11:05 PM EST RP Workstation: [id number]   CT ABDOMEN PELVIS WO CONTRAST Result Date: 02/18/2024 CLINICAL DATA:  Left upper quadrant pain. EXAM: CT ABDOMEN AND PELVIS WITHOUT CONTRAST TECHNIQUE: Multidetector CT imaging of the abdomen and pelvis was performed following the standard protocol without IV contrast. RADIATION DOSE REDUCTION: This exam was performed according to the departmental dose-optimization program which includes automated exposure control, adjustment of the mA and/or kV according to patient size and/or use  of iterative reconstruction technique. COMPARISON:  CT abdomen and pelvis 02/05/2024. FINDINGS: Lower chest: No acute abnormality. Hepatobiliary: No focal liver abnormality is seen. No gallstones, gallbladder wall thickening, or biliary dilatation. Pancreas: Unremarkable. No pancreatic ductal dilatation or surrounding inflammatory changes. Spleen: Normal in size without focal abnormality. Adrenals/Urinary Tract: There is a new left ureteral stent in place in proper position. There is no left-sided hydronephrosis. Stable position of calculus in the left mid ureter measuring 5 mm. Bilateral renal calculi are otherwise stable. Bilateral renal cysts have not significantly changed. Adrenal glands are within normal limits. There is diffuse bladder wall thickening versus normal under distension. Stomach/Bowel: Stomach is within normal limits. No evidence of bowel wall thickening, distention, or inflammatory changes. The appendix is not visualized. Vascular/Lymphatic: Aortic atherosclerosis. No enlarged abdominal or pelvic lymph nodes. There are few nonenlarged left retroperitoneal lymph nodes, unchanged. Reproductive: Prostate radiotherapy seeds are present. Other: There are small fat containing inguinal hernias. There is no ascites. Musculoskeletal: The bones are osteopenic. Chronic compression deformities of T12 and L1 appear unchanged.  IMPRESSION: 1. New left ureteral stent in place. No hydronephrosis. 2. Stable position of 5 mm calculus in the left mid ureter. 3. Stable bilateral renal calculi. 4. Diffuse bladder wall thickening versus normal under distension. Correlate clinically for cystitis. 5. Aortic atherosclerosis. Aortic Atherosclerosis (ICD10-I70.0). Electronically Signed   By: Greig Pique M.D.   On: 02/18/2024 19:54   DG Chest Port 1 View Result Date: 02/18/2024 CLINICAL DATA:  Sepsis EXAM: PORTABLE CHEST 1 VIEW COMPARISON:  Chest x-ray 02/05/2024 FINDINGS: Right upper extremity PICC terminates in the SVC. The heart size and mediastinal contours are within normal limits. Both lungs are clear. The visualized skeletal structures are unremarkable. IMPRESSION: No active disease. Electronically Signed   By: Greig Pique M.D.   On: 02/18/2024 18:42   DG Knee Complete 4 Views Right Result Date: 02/10/2024 EXAM: 4 OR MORE VIEW(S) XRAY OF THE KNEE 02/10/2024 10:42:00 AM COMPARISON: None available. CLINICAL HISTORY: 13689 Swelling [phone] FINDINGS: BONES AND JOINTS: No acute fracture. No focal osseous lesion. No joint dislocation. Mild tri-compartment degenerative changes. Chondrocalcinosis. Large suprapatellar knee joint effusion . SOFT TISSUES: The soft tissues are unremarkable. IMPRESSION: 1. Large suprapatellar knee joint effusion. 2. Tricompartmental osteoarthritis. Electronically signed by: Waddell Calk MD 02/10/2024 02:07 PM EST RP Workstation: [id number]   US  EKG SITE RITE Result Date: 02/10/2024 If Site Rite image not attached, placement could not be confirmed due to current cardiac rhythm.  US  EKG SITE RITE Result Date: 02/10/2024 If Site Rite image not attached, placement could not be confirmed due to current cardiac rhythm.  ECHOCARDIOGRAM COMPLETE Result Date: 02/07/2024    ECHOCARDIOGRAM REPORT   Patient Name:   Marcus Simpson Date of Exam: 02/07/2024 Medical Rec #:  [id number]         Height:       71.0 in  Accession #:    [id number]        Weight:       249.1 lb Date of Birth:  November 29, 1945          BSA:          2.315 m Patient Age:    78 years          BP:           127/67 mmHg Patient Gender: M                 HR:  66 bpm. Exam Location:  Inpatient Procedure: 2D Echo and Intracardiac Opacification Agent (Both Spectral and Color            Flow Doppler were utilized during procedure). Indications:    Endocarditis  History:        Patient has no prior history of Echocardiogram examinations.  Sonographer:    Charmaine Gaskins Referring Phys: [id number] [facility] IMPRESSIONS  1. NO obvious vegetations Recommend TEE if clinically indicated.  2. Left ventricular ejection fraction, by estimation, is 60 to 65%. The left ventricle has normal function. The left ventricle has no regional wall motion abnormalities. Left ventricular diastolic parameters were normal.  3. Right ventricular systolic function is normal. The right ventricular size is normal.  4. The mitral valve is normal in structure. Trivial mitral valve regurgitation.  5. The aortic valve is normal in structure. Aortic valve regurgitation is not visualized.  6. Aortic dilatation noted. There is mild dilatation of the ascending aorta, measuring 41 mm. FINDINGS  Left Ventricle: Left ventricular ejection fraction, by estimation, is 60 to 65%. The left ventricle has normal function. The left ventricle has no regional wall motion abnormalities. Definity  contrast agent was given IV to delineate the left ventricular  endocardial borders. The left ventricular internal cavity size was normal in size. There is no left ventricular hypertrophy. Left ventricular diastolic parameters were normal. Right Ventricle: The right ventricular size is normal. Right vetricular wall thickness was not assessed. Right ventricular systolic function is normal. Left Atrium: Left atrial size was normal in size. Right Atrium: Right atrial size was normal in size. Pericardium: There is no  evidence of pericardial effusion. Mitral Valve: The mitral valve is normal in structure. Trivial mitral valve regurgitation. Tricuspid Valve: The tricuspid valve is normal in structure. Tricuspid valve regurgitation is mild. Aortic Valve: The aortic valve is normal in structure. Aortic valve regurgitation is not visualized. Pulmonic Valve: The pulmonic valve was normal in structure. Pulmonic valve regurgitation is mild. Aorta: The aortic root is normal in size and structure and aortic dilatation noted. There is mild dilatation of the ascending aorta, measuring 41 mm. IAS/Shunts: No atrial level shunt detected by color flow Doppler.  LEFT VENTRICLE PLAX 2D LVIDd:         4.90 cm   Diastology LVIDs:         2.80 cm   LV e' medial:    6.64 cm/s LV PW:         0.90 cm   LV E/e' medial:  11.8 LV IVS:        0.90 cm   LV e' lateral:   7.18 cm/s LVOT diam:     2.20 cm   LV E/e' lateral: 10.9 LVOT Area:     3.80 cm  RIGHT VENTRICLE RV Basal diam:  3.30 cm RV Mid diam:    3.50 cm RV S prime:     14.10 cm/s LEFT ATRIUM             Index        RIGHT ATRIUM           Index LA Vol (A2C):   78.4 ml 33.87 ml/m  RA Area:     16.80 cm LA Vol (A4C):   71.1 ml 30.71 ml/m  RA Volume:   41.60 ml  17.97 ml/m LA Biplane Vol: 75.6 ml 32.66 ml/m   AORTA Ao Root diam: 3.50 cm Ao Asc diam:  4.10 cm MITRAL VALVE  TRICUSPID VALVE MV Area (PHT): 3.38 cm    TR Peak grad:   29.4 mmHg MV E velocity: 78.40 cm/s  TR Vmax:        271.00 cm/s MV A velocity: 87.80 cm/s MV E/A ratio:  0.89        SHUNTS                            Systemic Diam: 2.20 cm Vina Gull MD Electronically signed by Vina Gull MD Signature Date/Time: 02/07/2024/2:08:38 PM    Final    DG C-Arm 1-60 Min-No Report Result Date: 02/05/2024 Fluoroscopy was utilized by the requesting physician.  No radiographic interpretation.   CT ABDOMEN PELVIS WO CONTRAST Result Date: 02/05/2024 CLINICAL DATA:  Kidney infection and is on antibiotics, presenting with  fever. EXAM: CT ABDOMEN AND PELVIS WITHOUT CONTRAST TECHNIQUE: Multidetector CT imaging of the abdomen and pelvis was performed following the standard protocol without IV contrast. RADIATION DOSE REDUCTION: This exam was performed according to the departmental dose-optimization program which includes automated exposure control, adjustment of the mA and/or kV according to patient size and/or use of iterative reconstruction technique. COMPARISON:  October 30, 2023 FINDINGS: Lower chest: Mild lingular and left basilar atelectasis is seen. Hepatobiliary: No focal liver abnormality is seen. No gallstones, gallbladder wall thickening, or biliary dilatation. Pancreas: Unremarkable. No pancreatic ductal dilatation or surrounding inflammatory changes. Spleen: Normal in size without focal abnormality. Adrenals/Urinary Tract: Adrenal glands are unremarkable. Kidneys are normal size. Stable bilateral parenchymal and peripelvic renal cysts are seen. Multiple subcentimeter non-obstructing renal calculi of various sizes are seen within both kidneys. A 6 mm obstructing renal calculus is seen within the proximal to mid left ureter with mild left-sided hydronephrosis and hydroureter. There is mild to moderate severity diffuse urinary bladder wall thickening. Stomach/Bowel: Stomach is within normal limits. Appendix appears normal. No evidence of bowel wall thickening, distention, or inflammatory changes. Vascular/Lymphatic: Aortic atherosclerosis. No enlarged abdominal or pelvic lymph nodes. 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 Other: No abdominal wall hernia or abnormality. No abdominopelvic ascites. Musculoskeletal: Multilevel degenerative changes are seen throughout the lumbar spine. IMPRESSION: 1. 6 mm obstructing renal calculus within the proximal to mid left ureter. 2. Multiple bilateral subcentimeter non-obstructing renal calculi. 3. Stable  bilateral parenchymal and peripelvic renal cysts. No follow-up imaging is recommended. This recommendation follows ACR consensus guidelines: Management of the Incidental Renal Mass on CT: A White Paper of the ACR Incidental Findings Committee. J Am Coll Radiol [phone]. 4. Mild to moderate severity diffuse urinary bladder wall thickening which may represent sequelae associated with cystitis. Correlation with urinalysis is recommended. 5. 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 6. Aortic atherosclerosis. Electronically Signed   By: Suzen Dials M.D.   On: 02/05/2024 17:01   DG Chest Port 1 View Result Date: 02/05/2024 CLINICAL DATA:  Possible sepsis.  Kidney infection on antibiotics. EXAM: PORTABLE CHEST 1 VIEW COMPARISON:  10/30/2023, 12/29/2022 FINDINGS: Lungs are hypoinflated with chronic lateral left base opacification. No acute airspace process or effusion. Cardiomediastinal silhouette and remainder of the exam is unchanged. IMPRESSION: Hypoinflation without acute cardiopulmonary disease. Electronically Signed   By: Toribio Agreste M.D.   On: 02/05/2024 15:07     Discharge Exam: Vitals:   02/23/24 0350 02/23/24 0723  BP: (!) 165/73 (!) 179/74  Pulse:  70  Resp: 20 19  Temp:  98.1 F (36.7 C) 99.2 F (37.3 C)  SpO2: 95% 96%   Vitals:   02/22/24 1952 02/22/24 2307 02/23/24 0350 02/23/24 0723  BP: (!) 155/75 (!) 162/86 (!) 165/73 (!) 179/74  Pulse:  82  70  Resp: 16 18 20 19   Temp: 99 F (37.2 C) 97.7 F (36.5 C) 98.1 F (36.7 C) 99.2 F (37.3 C)  TempSrc: Oral Oral Oral Oral  SpO2: 95% 94% 95% 96%  Weight:   103.8 kg   Height:        General: Pt is alert, awake, not in acute distress Cardiovascular: RRR, S1/S2 +, no rubs, no gallops Respiratory: CTA bilaterally, no wheezing, no rhonchi Abdominal: Soft, NT, ND, bowel sounds + Extremities: no edema, no cyanosis    The results of significant diagnostics  from this hospitalization (including imaging, microbiology, ancillary and laboratory) are listed below for reference.     Microbiology: Recent Results (from the past 240 hours)  Resp panel by RT-PCR (RSV, Flu A&B, Covid) Anterior Nasal Swab     Status: None   Collection Time: 02/18/24  6:25 PM   Specimen: Anterior Nasal Swab  Result Value Ref Range Status   SARS Coronavirus 2 by RT PCR NEGATIVE NEGATIVE Final    Comment: (NOTE) SARS-CoV-2 target nucleic acids are NOT DETECTED.  The SARS-CoV-2 RNA is generally detectable in upper respiratory specimens during the acute phase of infection. The lowest concentration of SARS-CoV-2 viral copies this assay can detect is 138 copies/mL. A negative result does not preclude SARS-Cov-2 infection and should not be used as the sole basis for treatment or other patient management decisions. A negative result may occur with  improper specimen collection/handling, submission of specimen other than nasopharyngeal swab, presence of viral mutation(s) within the areas targeted by this assay, and inadequate number of viral copies(<138 copies/mL). A negative result must be combined with clinical observations, patient history, and epidemiological information. The expected result is Negative.  Fact Sheet for Patients:  bloggercourse.com  Fact Sheet for Healthcare Providers:  seriousbroker.it  This test is no t yet approved or cleared by the [place]  FDA and  has been authorized for detection and/or diagnosis of SARS-CoV-2 by FDA under an Emergency Use Authorization (EUA). This EUA will remain  in effect (meaning this test can be used) for the duration of the COVID-19 declaration under Section 564(b)(1) of the Act, 21 U.S.C.section 360bbb-3(b)(1), unless the authorization is terminated  or revoked sooner.       Influenza A by PCR NEGATIVE NEGATIVE Final   Influenza B by PCR NEGATIVE NEGATIVE Final     Comment: (NOTE) The Xpert Xpress SARS-CoV-2/FLU/RSV plus assay is intended as an aid in the diagnosis of influenza from Nasopharyngeal swab specimens and should not be used as a sole basis for treatment. Nasal washings and aspirates are unacceptable for Xpert Xpress SARS-CoV-2/FLU/RSV testing.  Fact Sheet for Patients: bloggercourse.com  Fact Sheet for Healthcare Providers: seriousbroker.it  This test is not yet approved or cleared by the [place]  FDA and has been authorized for detection and/or diagnosis of SARS-CoV-2 by FDA under an Emergency Use Authorization (EUA). This EUA will remain in effect (meaning this test can be used) for the duration of the COVID-19 declaration under Section 564(b)(1) of the Act, 21 U.S.C. section 360bbb-3(b)(1), unless the authorization is terminated or revoked.     Resp Syncytial Virus by PCR NEGATIVE NEGATIVE Final    Comment: (NOTE) Fact Sheet for Patients: bloggercourse.com  Fact Sheet for Healthcare Providers:  seriousbroker.it  This test is not yet approved or cleared by the [place]  FDA and has been authorized for detection and/or diagnosis of SARS-CoV-2 by FDA under an Emergency Use Authorization (EUA). This EUA will remain in effect (meaning this test can be used) for the duration of the COVID-19 declaration under Section 564(b)(1) of the Act, 21 U.S.C. section 360bbb-3(b)(1), unless the authorization is terminated or revoked.  Performed at [facility], [address], [place], [place] [address]   Blood Culture (routine x 2)     Status: None (Preliminary result)   Collection Time: 02/18/24  6:25 PM   Specimen: BLOOD  Result Value Ref Range Status   Specimen Description BLOOD LEFT ANTECUBITAL  Final   Special Requests   Final    BOTTLES DRAWN AEROBIC AND ANAEROBIC Blood Culture adequate volume   Culture   Final    NO  GROWTH 4 DAYS Performed at [facility], [address] [place], [place] [address]    Report Status PENDING  Incomplete  Blood Culture (routine x 2)     Status: Abnormal   Collection Time: 02/18/24  6:30 PM   Specimen: BLOOD RIGHT ARM  Result Value Ref Range Status   Specimen Description   Final    BLOOD RIGHT ARM Performed at [facility], [address], [place], [place] [address]    Special Requests   Final    BOTTLES DRAWN AEROBIC AND ANAEROBIC Blood Culture adequate volume Performed at [facility], [address], [place], [place] [address]    Culture  Setup Time   Final    AEROBIC BOTTLE ONLY GRAM NEGATIVE RODS Gram Stain Report Called to,Read Back By and Verified With: Y. BALDERAS ON 02/19/2024 @4 :14PM BY T. HAMER  CRITICAL RESULT CALLED TO, READ BACK BY AND VERIFIED WITH: PHARMD J LEDFORD 11//30/2025 @ 2230 BY AB Performed at [facility], [address] [place], [place] [address]    Culture PSEUDOMONAS AERUGINOSA (A)  Final   Report Status 02/21/2024 FINAL  Final   Organism ID, Bacteria PSEUDOMONAS AERUGINOSA  Final      Susceptibility   Pseudomonas aeruginosa - MIC*    MEROPENEM 1 SENSITIVE Sensitive     CIPROFLOXACIN  0.12 SENSITIVE Sensitive     IMIPENEM 4 INTERMEDIATE Intermediate     PIP/TAZO Value in next row Sensitive      16 SENSITIVEThis is a modified FDA-approved test that has been validated and its performance characteristics determined by the reporting laboratory.  This laboratory is certified under the Clinical Laboratory Improvement Amendments CLIA as qualified to perform high complexity clinical laboratory testing.    CEFEPIME  Value in next row Sensitive      16 SENSITIVEThis is a modified FDA-approved test that has been validated and its performance characteristics determined by the reporting laboratory.  This laboratory is certified under the Clinical Laboratory Improvement Amendments CLIA as qualified to perform high complexity clinical  laboratory testing.    CEFTAZIDIME/AVIBACTAM Value in next row Sensitive      16 SENSITIVEThis is a modified FDA-approved test that has been validated and its performance characteristics determined by the reporting laboratory.  This laboratory is certified under the Clinical Laboratory Improvement Amendments CLIA as qualified to perform high complexity clinical laboratory testing.    CEFTOLOZANE/TAZOBACTAM Value in next row Sensitive      16 SENSITIVEThis is a modified FDA-approved test that has been validated and its performance characteristics determined by the reporting laboratory.  This laboratory is certified  under the Clinical Laboratory Improvement Amendments CLIA as qualified to perform high complexity clinical laboratory testing.    TOBRAMYCIN Value in next row Sensitive      16 SENSITIVEThis is a modified FDA-approved test that has been validated and its performance characteristics determined by the reporting laboratory.  This laboratory is certified under the Clinical Laboratory Improvement Amendments CLIA as qualified to perform high complexity clinical laboratory testing.    CEFTAZIDIME Value in next row Sensitive      16 SENSITIVEThis is a modified FDA-approved test that has been validated and its performance characteristics determined by the reporting laboratory.  This laboratory is certified under the Clinical Laboratory Improvement Amendments CLIA as qualified to perform high complexity clinical laboratory testing.    * PSEUDOMONAS AERUGINOSA  Blood Culture ID Panel (Reflexed)     Status: Abnormal   Collection Time: 02/18/24  6:30 PM  Result Value Ref Range Status   Enterococcus faecalis NOT DETECTED NOT DETECTED Final   Enterococcus Faecium NOT DETECTED NOT DETECTED Final   Listeria monocytogenes NOT DETECTED NOT DETECTED Final   Staphylococcus species NOT DETECTED NOT DETECTED Final   Staphylococcus aureus (BCID) NOT DETECTED NOT DETECTED Final   Staphylococcus epidermidis NOT  DETECTED NOT DETECTED Final   Staphylococcus lugdunensis NOT DETECTED NOT DETECTED Final   Streptococcus species NOT DETECTED NOT DETECTED Final   Streptococcus agalactiae NOT DETECTED NOT DETECTED Final   Streptococcus pneumoniae NOT DETECTED NOT DETECTED Final   Streptococcus pyogenes NOT DETECTED NOT DETECTED Final   A.calcoaceticus-baumannii NOT DETECTED NOT DETECTED Final   Bacteroides fragilis NOT DETECTED NOT DETECTED Final   Enterobacterales NOT DETECTED NOT DETECTED Final   Enterobacter cloacae complex NOT DETECTED NOT DETECTED Final   Escherichia coli NOT DETECTED NOT DETECTED Final   Klebsiella aerogenes NOT DETECTED NOT DETECTED Final   Klebsiella oxytoca NOT DETECTED NOT DETECTED Final   Klebsiella pneumoniae NOT DETECTED NOT DETECTED Final   Proteus species NOT DETECTED NOT DETECTED Final   Salmonella species NOT DETECTED NOT DETECTED Final   Serratia marcescens NOT DETECTED NOT DETECTED Final   Haemophilus influenzae NOT DETECTED NOT DETECTED Final   Neisseria meningitidis NOT DETECTED NOT DETECTED Final   Pseudomonas aeruginosa DETECTED (A) NOT DETECTED Final    Comment: CRITICAL RESULT CALLED TO, READ BACK BY AND VERIFIED WITH: PHARMD J LEDFORD 11//30/2025 @ 2230 BY AB    Stenotrophomonas maltophilia NOT DETECTED NOT DETECTED Final   Candida albicans NOT DETECTED NOT DETECTED Final   Candida auris NOT DETECTED NOT DETECTED Final   Candida glabrata NOT DETECTED NOT DETECTED Final   Candida krusei NOT DETECTED NOT DETECTED Final   Candida parapsilosis NOT DETECTED NOT DETECTED Final   Candida tropicalis NOT DETECTED NOT DETECTED Final   Cryptococcus neoformans/gattii NOT DETECTED NOT DETECTED Final   CTX-M ESBL NOT DETECTED NOT DETECTED Final   Carbapenem resistance IMP NOT DETECTED NOT DETECTED Final   Carbapenem resistance KPC NOT DETECTED NOT DETECTED Final   Carbapenem resistance NDM NOT DETECTED NOT DETECTED Final   Carbapenem resistance VIM NOT DETECTED NOT  DETECTED Final    Comment: Performed at [facility], [address] [place], [place] [address]  MRSA Next Gen by PCR, Nasal     Status: None   Collection Time: 02/18/24  9:57 PM   Specimen: Nasal Mucosa; Nasal Swab  Result Value Ref Range Status   MRSA by PCR Next Gen NOT DETECTED NOT DETECTED Final    Comment: (NOTE) The GeneXpert MRSA  Assay (FDA approved for NASAL specimens only), is one component of a comprehensive MRSA colonization surveillance program. It is not intended to diagnose MRSA infection nor to guide or monitor treatment for MRSA infections. Test performance is not FDA approved in patients less than 64 years old. Performed at [facility], [address] [place], [place] [address]   Urine Culture     Status: Abnormal   Collection Time: 02/18/24 10:45 PM   Specimen: Urine, Random  Result Value Ref Range Status   Specimen Description URINE, RANDOM  Final   Special Requests   Final    NONE Reflexed from [id number] Performed at [facility], [address], [place], [place] [address]    Culture 60,000 COLONIES/mL PSEUDOMONAS AERUGINOSA (A)  Final   Report Status 02/21/2024 FINAL  Final   Organism ID, Bacteria PSEUDOMONAS AERUGINOSA (A)  Final      Susceptibility   Pseudomonas aeruginosa - MIC*    MEROPENEM 1 SENSITIVE Sensitive     CIPROFLOXACIN  0.12 SENSITIVE Sensitive     IMIPENEM 2 SENSITIVE Sensitive     PIP/TAZO Value in next row Sensitive      16 SENSITIVEThis is a modified FDA-approved test that has been validated and its performance characteristics determined by the reporting laboratory.  This laboratory is certified under the Clinical Laboratory Improvement Amendments CLIA as qualified to perform high complexity clinical laboratory testing.    CEFEPIME  Value in next row Sensitive      16 SENSITIVEThis is a modified FDA-approved test that has been validated and its performance characteristics determined by the reporting laboratory.  This  laboratory is certified under the Clinical Laboratory Improvement Amendments CLIA as qualified to perform high complexity clinical laboratory testing.    CEFTAZIDIME/AVIBACTAM Value in next row Sensitive      16 SENSITIVEThis is a modified FDA-approved test that has been validated and its performance characteristics determined by the reporting laboratory.  This laboratory is certified under the Clinical Laboratory Improvement Amendments CLIA as qualified to perform high complexity clinical laboratory testing.    CEFTOLOZANE/TAZOBACTAM Value in next row Sensitive      16 SENSITIVEThis is a modified FDA-approved test that has been validated and its performance characteristics determined by the reporting laboratory.  This laboratory is certified under the Clinical Laboratory Improvement Amendments CLIA as qualified to perform high complexity clinical laboratory testing.    TOBRAMYCIN Value in next row Sensitive      16 SENSITIVEThis is a modified FDA-approved test that has been validated and its performance characteristics determined by the reporting laboratory.  This laboratory is certified under the Clinical Laboratory Improvement Amendments CLIA as qualified to perform high complexity clinical laboratory testing.    CEFTAZIDIME Value in next row Sensitive      16 SENSITIVEThis is a modified FDA-approved test that has been validated and its performance characteristics determined by the reporting laboratory.  This laboratory is certified under the Clinical Laboratory Improvement Amendments CLIA as qualified to perform high complexity clinical laboratory testing.    * 60,000 COLONIES/mL PSEUDOMONAS AERUGINOSA  Culture, blood (Routine X 2) w Reflex to ID Panel     Status: None (Preliminary result)   Collection Time: 02/21/24  7:33 AM   Specimen: BLOOD RIGHT ARM  Result Value Ref Range Status   Specimen Description BLOOD RIGHT ARM  Final   Special Requests   Final    BOTTLES DRAWN AEROBIC AND ANAEROBIC  Blood Culture adequate volume   Culture  Final    NO GROWTH 2 DAYS Performed at [facility], [address] [place], [place] [address]    Report Status PENDING  Incomplete  Culture, blood (Routine X 2) w Reflex to ID Panel     Status: None (Preliminary result)   Collection Time: 02/21/24  7:40 AM   Specimen: BLOOD LEFT ARM  Result Value Ref Range Status   Specimen Description BLOOD LEFT ARM  Final   Special Requests   Final    BOTTLES DRAWN AEROBIC AND ANAEROBIC Blood Culture adequate volume   Culture   Final    NO GROWTH 2 DAYS Performed at [facility], [address] [place], [place] [address]    Report Status PENDING  Incomplete     Labs: BNP (last 3 results) Recent Labs    02/18/24 2305  BNP 312.3*   Basic Metabolic Panel: Recent Labs  Lab 02/19/24 0350 02/20/24 0214 02/21/24 0331 02/22/24 0508 02/23/24 0342  NA 132* 134* 136 139 136  K 5.0 4.1 4.3 4.3 3.9  CL 98 103 104 100 104  CO2 21* 20* 23 24 22   GLUCOSE 127* 120* 78 97 111*  BUN 38* 43* 39* 33* 28*  CREATININE 2.77* 2.85* 2.59* 2.21* 1.87*  CALCIUM  8.4* 7.9* 8.5* 9.0 8.5*  MG 2.3 2.1 2.2 1.9 1.8  PHOS 4.6 4.2 4.0 4.2 3.9   Liver Function Tests: Recent Labs  Lab 02/18/24 1825  AST 32  ALT 29  ALKPHOS 62  BILITOT 1.3*  PROT 5.9*  ALBUMIN 3.0*   No results for input(s): LIPASE, AMYLASE in the last 168 hours. No results for input(s): AMMONIA in the last 168 hours. CBC: Recent Labs  Lab 02/18/24 1825 02/18/24 2310 02/19/24 0350 02/20/24 0214 02/21/24 0331 02/22/24 0508 02/23/24 0342  WBC 63.9*  --  49.8* 23.5* 13.9* 11.3* 8.0  NEUTROABS 51.5*  --   --   --   --   --   --   HGB 11.9*   < > 9.8* 9.1* 10.1* 9.9* 10.1*  HCT 36.5*   < > 29.7* 27.7* 31.2* 30.0* 31.0*  MCV 93.1  --  92.5 92.3 92.6 91.7 91.2  PLT 422*  --  350 264 253 257 263   < > = values in this interval not displayed.   Cardiac Enzymes: Recent Labs  Lab 02/20/24 1017  CKTOTAL 103    BNP: Invalid input(s): POCBNP CBG: Recent Labs  Lab 02/22/24 0601 02/22/24 1146 02/22/24 1645 02/22/24 2112 02/23/24 0605  GLUCAP 97 94 91 99 96   D-Dimer No results for input(s): DDIMER in the last 72 hours. Hgb A1c No results for input(s): HGBA1C in the last 72 hours. Lipid Profile No results for input(s): CHOL, HDL, LDLCALC, TRIG, CHOLHDL, LDLDIRECT in the last 72 hours. Thyroid  function studies No results for input(s): TSH, T4TOTAL, T3FREE, THYROIDAB in the last 72 hours.  Invalid input(s): FREET3 Anemia work up No results for input(s): VITAMINB12, FOLATE, FERRITIN, TIBC, IRON, RETICCTPCT in the last 72 hours. Urinalysis    Component Value Date/Time   COLORURINE YELLOW 02/18/2024 2245   APPEARANCEUR CLOUDY (A) 02/18/2024 2245   APPEARANCEUR Clear 05/17/2023 1312   LABSPEC 1.020 02/18/2024 2245   PHURINE 6.0 02/18/2024 2245   GLUCOSEU NEGATIVE 02/18/2024 2245   HGBUR LARGE (A) 02/18/2024 2245   BILIRUBINUR NEGATIVE 02/18/2024 2245   BILIRUBINUR Negative 05/17/2023 1312   KETONESUR NEGATIVE 02/18/2024 2245   PROTEINUR >300 (A) 02/18/2024 2245   NITRITE POSITIVE (  A) 02/18/2024 2245   LEUKOCYTESUR MODERATE (A) 02/18/2024 2245   Sepsis Labs Recent Labs  Lab 02/20/24 0214 02/21/24 0331 02/22/24 0508 02/23/24 0342  WBC 23.5* 13.9* 11.3* 8.0   Microbiology Recent Results (from the past 240 hours)  Resp panel by RT-PCR (RSV, Flu A&B, Covid) Anterior Nasal Swab     Status: None   Collection Time: 02/18/24  6:25 PM   Specimen: Anterior Nasal Swab  Result Value Ref Range Status   SARS Coronavirus 2 by RT PCR NEGATIVE NEGATIVE Final    Comment: (NOTE) SARS-CoV-2 target nucleic acids are NOT DETECTED.  The SARS-CoV-2 RNA is generally detectable in upper respiratory specimens during the acute phase of infection. The lowest concentration of SARS-CoV-2 viral copies this assay can detect is 138 copies/mL. A negative result  does not preclude SARS-Cov-2 infection and should not be used as the sole basis for treatment or other patient management decisions. A negative result may occur with  improper specimen collection/handling, submission of specimen other than nasopharyngeal swab, presence of viral mutation(s) within the areas targeted by this assay, and inadequate number of viral copies(<138 copies/mL). A negative result must be combined with clinical observations, patient history, and epidemiological information. The expected result is Negative.  Fact Sheet for Patients:  bloggercourse.com  Fact Sheet for Healthcare Providers:  seriousbroker.it  This test is no t yet approved or cleared by the [place]  FDA and  has been authorized for detection and/or diagnosis of SARS-CoV-2 by FDA under an Emergency Use Authorization (EUA). This EUA will remain  in effect (meaning this test can be used) for the duration of the COVID-19 declaration under Section 564(b)(1) of the Act, 21 U.S.C.section 360bbb-3(b)(1), unless the authorization is terminated  or revoked sooner.       Influenza A by PCR NEGATIVE NEGATIVE Final   Influenza B by PCR NEGATIVE NEGATIVE Final    Comment: (NOTE) The Xpert Xpress SARS-CoV-2/FLU/RSV plus assay is intended as an aid in the diagnosis of influenza from Nasopharyngeal swab specimens and should not be used as a sole basis for treatment. Nasal washings and aspirates are unacceptable for Xpert Xpress SARS-CoV-2/FLU/RSV testing.  Fact Sheet for Patients: bloggercourse.com  Fact Sheet for Healthcare Providers: seriousbroker.it  This test is not yet approved or cleared by the [place]  FDA and has been authorized for detection and/or diagnosis of SARS-CoV-2 by FDA under an Emergency Use Authorization (EUA). This EUA will remain in effect (meaning this test can be used) for  the duration of the COVID-19 declaration under Section 564(b)(1) of the Act, 21 U.S.C. section 360bbb-3(b)(1), unless the authorization is terminated or revoked.     Resp Syncytial Virus by PCR NEGATIVE NEGATIVE Final    Comment: (NOTE) Fact Sheet for Patients: bloggercourse.com  Fact Sheet for Healthcare Providers: seriousbroker.it  This test is not yet approved or cleared by the [place]  FDA and has been authorized for detection and/or diagnosis of SARS-CoV-2 by FDA under an Emergency Use Authorization (EUA). This EUA will remain in effect (meaning this test can be used) for the duration of the COVID-19 declaration under Section 564(b)(1) of the Act, 21 U.S.C. section 360bbb-3(b)(1), unless the authorization is terminated or revoked.  Performed at [facility], [address], [place], [place] [address]   Blood Culture (routine x 2)     Status: None (Preliminary result)   Collection Time: 02/18/24  6:25 PM   Specimen: BLOOD  Result Value Ref Range Status   Specimen Description BLOOD LEFT ANTECUBITAL  Final   Special Requests   Final    BOTTLES DRAWN AEROBIC AND ANAEROBIC Blood Culture adequate volume   Culture   Final    NO GROWTH 4 DAYS Performed at [facility], [address] [place], [place] [address]    Report Status PENDING  Incomplete  Blood Culture (routine x 2)     Status: Abnormal   Collection Time: 02/18/24  6:30 PM   Specimen: BLOOD RIGHT ARM  Result Value Ref Range Status   Specimen Description   Final    BLOOD RIGHT ARM Performed at [facility], [address], [place], [place] [address]    Special Requests   Final    BOTTLES DRAWN AEROBIC AND ANAEROBIC Blood Culture adequate volume Performed at [facility], [address], [place], [place] [address]    Culture  Setup Time   Final    AEROBIC BOTTLE ONLY GRAM NEGATIVE RODS Gram Stain Report Called to,Read Back By and Verified With: Y.  BALDERAS ON 02/19/2024 @4 :14PM BY T. HAMER  CRITICAL RESULT CALLED TO, READ BACK BY AND VERIFIED WITH: PHARMD J LEDFORD 11//30/2025 @ 2230 BY AB Performed at [facility], [address] [place], [place] [address]    Culture PSEUDOMONAS AERUGINOSA (A)  Final   Report Status 02/21/2024 FINAL  Final   Organism ID, Bacteria PSEUDOMONAS AERUGINOSA  Final      Susceptibility   Pseudomonas aeruginosa - MIC*    MEROPENEM 1 SENSITIVE Sensitive     CIPROFLOXACIN  0.12 SENSITIVE Sensitive     IMIPENEM 4 INTERMEDIATE Intermediate     PIP/TAZO Value in next row Sensitive      16 SENSITIVEThis is a modified FDA-approved test that has been validated and its performance characteristics determined by the reporting laboratory.  This laboratory is certified under the Clinical Laboratory Improvement Amendments CLIA as qualified to perform high complexity clinical laboratory testing.    CEFEPIME  Value in next row Sensitive      16 SENSITIVEThis is a modified FDA-approved test that has been validated and its performance characteristics determined by the reporting laboratory.  This laboratory is certified under the Clinical Laboratory Improvement Amendments CLIA as qualified to perform high complexity clinical laboratory testing.    CEFTAZIDIME/AVIBACTAM Value in next row Sensitive      16 SENSITIVEThis is a modified FDA-approved test that has been validated and its performance characteristics determined by the reporting laboratory.  This laboratory is certified under the Clinical Laboratory Improvement Amendments CLIA as qualified to perform high complexity clinical laboratory testing.    CEFTOLOZANE/TAZOBACTAM Value in next row Sensitive      16 SENSITIVEThis is a modified FDA-approved test that has been validated and its performance characteristics determined by the reporting laboratory.  This laboratory is certified under the Clinical Laboratory Improvement Amendments CLIA as qualified to perform high  complexity clinical laboratory testing.    TOBRAMYCIN Value in next row Sensitive      16 SENSITIVEThis is a modified FDA-approved test that has been validated and its performance characteristics determined by the reporting laboratory.  This laboratory is certified under the Clinical Laboratory Improvement Amendments CLIA as qualified to perform high complexity clinical laboratory testing.    CEFTAZIDIME Value in next row Sensitive      16 SENSITIVEThis is a modified FDA-approved test that has been validated and its performance characteristics determined by the reporting laboratory.  This laboratory is certified under the Clinical Laboratory Improvement Amendments CLIA as qualified to perform high  complexity clinical laboratory testing.    * PSEUDOMONAS AERUGINOSA  Blood Culture ID Panel (Reflexed)     Status: Abnormal   Collection Time: 02/18/24  6:30 PM  Result Value Ref Range Status   Enterococcus faecalis NOT DETECTED NOT DETECTED Final   Enterococcus Faecium NOT DETECTED NOT DETECTED Final   Listeria monocytogenes NOT DETECTED NOT DETECTED Final   Staphylococcus species NOT DETECTED NOT DETECTED Final   Staphylococcus aureus (BCID) NOT DETECTED NOT DETECTED Final   Staphylococcus epidermidis NOT DETECTED NOT DETECTED Final   Staphylococcus lugdunensis NOT DETECTED NOT DETECTED Final   Streptococcus species NOT DETECTED NOT DETECTED Final   Streptococcus agalactiae NOT DETECTED NOT DETECTED Final   Streptococcus pneumoniae NOT DETECTED NOT DETECTED Final   Streptococcus pyogenes NOT DETECTED NOT DETECTED Final   A.calcoaceticus-baumannii NOT DETECTED NOT DETECTED Final   Bacteroides fragilis NOT DETECTED NOT DETECTED Final   Enterobacterales NOT DETECTED NOT DETECTED Final   Enterobacter cloacae complex NOT DETECTED NOT DETECTED Final   Escherichia coli NOT DETECTED NOT DETECTED Final   Klebsiella aerogenes NOT DETECTED NOT DETECTED Final   Klebsiella oxytoca NOT DETECTED NOT DETECTED  Final   Klebsiella pneumoniae NOT DETECTED NOT DETECTED Final   Proteus species NOT DETECTED NOT DETECTED Final   Salmonella species NOT DETECTED NOT DETECTED Final   Serratia marcescens NOT DETECTED NOT DETECTED Final   Haemophilus influenzae NOT DETECTED NOT DETECTED Final   Neisseria meningitidis NOT DETECTED NOT DETECTED Final   Pseudomonas aeruginosa DETECTED (A) NOT DETECTED Final    Comment: CRITICAL RESULT CALLED TO, READ BACK BY AND VERIFIED WITH: PHARMD J LEDFORD 11//30/2025 @ 2230 BY AB    Stenotrophomonas maltophilia NOT DETECTED NOT DETECTED Final   Candida albicans NOT DETECTED NOT DETECTED Final   Candida auris NOT DETECTED NOT DETECTED Final   Candida glabrata NOT DETECTED NOT DETECTED Final   Candida krusei NOT DETECTED NOT DETECTED Final   Candida parapsilosis NOT DETECTED NOT DETECTED Final   Candida tropicalis NOT DETECTED NOT DETECTED Final   Cryptococcus neoformans/gattii NOT DETECTED NOT DETECTED Final   CTX-M ESBL NOT DETECTED NOT DETECTED Final   Carbapenem resistance IMP NOT DETECTED NOT DETECTED Final   Carbapenem resistance KPC NOT DETECTED NOT DETECTED Final   Carbapenem resistance NDM NOT DETECTED NOT DETECTED Final   Carbapenem resistance VIM NOT DETECTED NOT DETECTED Final    Comment: Performed at [facility], [address], [place], [place] [address]  MRSA Next Gen by PCR, Nasal     Status: None   Collection Time: 02/18/24  9:57 PM   Specimen: Nasal Mucosa; Nasal Swab  Result Value Ref Range Status   MRSA by PCR Next Gen NOT DETECTED NOT DETECTED Final    Comment: (NOTE) The GeneXpert MRSA Assay (FDA approved for NASAL specimens only), is one component of a comprehensive MRSA colonization surveillance program. It is not intended to diagnose MRSA infection nor to guide or monitor treatment for MRSA infections. Test performance is not FDA approved in patients less than 73 years old. Performed at [facility], [address]  [place], [place] [address]   Urine Culture     Status: Abnormal   Collection Time: 02/18/24 10:45 PM   Specimen: Urine, Random  Result Value Ref Range Status   Specimen Description URINE, RANDOM  Final   Special Requests   Final    NONE Reflexed from [id number] Performed at [facility], [address], [place],   [address]    Culture 60,000 COLONIES/mL PSEUDOMONAS AERUGINOSA (A)  Final   Report Status 02/21/2024 FINAL  Final   Organism ID, Bacteria PSEUDOMONAS AERUGINOSA (A)  Final      Susceptibility   Pseudomonas aeruginosa - MIC*    MEROPENEM 1 SENSITIVE Sensitive     CIPROFLOXACIN  0.12 SENSITIVE Sensitive     IMIPENEM 2 SENSITIVE Sensitive     PIP/TAZO Value in next row Sensitive      16 SENSITIVEThis is a modified FDA-approved test that has been validated and its performance characteristics determined by the reporting laboratory.  This laboratory is certified under the Clinical Laboratory Improvement Amendments CLIA as qualified to perform high complexity clinical laboratory testing.    CEFEPIME  Value in next row Sensitive      16 SENSITIVEThis is a modified FDA-approved test that has been validated and its performance characteristics determined by the reporting laboratory.  This laboratory is certified under the Clinical Laboratory Improvement Amendments CLIA as qualified to perform high complexity clinical laboratory testing.    CEFTAZIDIME/AVIBACTAM Value in next row Sensitive      16 SENSITIVEThis is a modified FDA-approved test that has been validated and its performance characteristics determined by the reporting laboratory.  This laboratory is certified under the Clinical Laboratory Improvement Amendments CLIA as qualified to perform high complexity clinical laboratory testing.    CEFTOLOZANE/TAZOBACTAM Value in next row Sensitive      16 SENSITIVEThis is a modified FDA-approved test that has been validated and its performance characteristics determined by the reporting  laboratory.  This laboratory is certified under the Clinical Laboratory Improvement Amendments CLIA as qualified to perform high complexity clinical laboratory testing.    TOBRAMYCIN Value in next row Sensitive      16 SENSITIVEThis is a modified FDA-approved test that has been validated and its performance characteristics determined by the reporting laboratory.  This laboratory is certified under the Clinical Laboratory Improvement Amendments CLIA as qualified to perform high complexity clinical laboratory testing.    CEFTAZIDIME Value in next row Sensitive      16 SENSITIVEThis is a modified FDA-approved test that has been validated and its performance characteristics determined by the reporting laboratory.  This laboratory is certified under the Clinical Laboratory Improvement Amendments CLIA as qualified to perform high complexity clinical laboratory testing.    * 60,000 COLONIES/mL PSEUDOMONAS AERUGINOSA  Culture, blood (Routine X 2) w Reflex to ID Panel     Status: None (Preliminary result)   Collection Time: 02/21/24  7:33 AM   Specimen: BLOOD RIGHT ARM  Result Value Ref Range Status   Specimen Description BLOOD RIGHT ARM  Final   Special Requests   Final    BOTTLES DRAWN AEROBIC AND ANAEROBIC Blood Culture adequate volume   Culture   Final    NO GROWTH 2 DAYS Performed at [facility], [address], [place], [place] [address]    Report Status PENDING  Incomplete  Culture, blood (Routine X 2) w Reflex to ID Panel     Status: None (Preliminary result)   Collection Time: 02/21/24  7:40 AM   Specimen: BLOOD LEFT ARM  Result Value Ref Range Status   Specimen Description BLOOD LEFT ARM  Final   Special Requests   Final    BOTTLES DRAWN AEROBIC AND ANAEROBIC Blood Culture adequate volume   Culture   Final    NO GROWTH 2 DAYS Performed at [facility], [address] [place], [place] [address]  Report Status PENDING  Incomplete    FURTHER DISCHARGE INSTRUCTIONS:    Get Medicines reviewed and adjusted: Please take all your medications with you for your next visit with your Primary MD   Laboratory/radiological data: Please request your Primary MD to go over all hospital tests and procedure/radiological results at the follow up, please ask your Primary MD to get all Hospital records sent to his/her office.   In some cases, they will be blood work, cultures and biopsy results pending at the time of your discharge. Please request that your primary care M.D. goes through all the records of your hospital data and follows up on these results.   Also Note the following: If you experience worsening of your admission symptoms, develop shortness of breath, life threatening emergency, suicidal or homicidal thoughts you must seek medical attention immediately by calling 911 or calling your MD immediately  if symptoms less severe.   You must read complete instructions/literature along with all the possible adverse reactions/side effects for all the Medicines you take and that have been prescribed to you. Take any new Medicines after you have completely understood and accpet all the possible adverse reactions/side effects.    patient was instructed, not to drive, operate heavy machinery, perform activities at heights, swimming or participation in water  activities or provide baby-sitting services while on Pain, Sleep and Anxiety Medications; until their outpatient Physician has advised to do so again. Also recommended to not to take more than prescribed Pain, Sleep and Anxiety Medications.  It is not advisable to combine anxiety, sleep and pain medications without talking with your primary care provider.     Wear Seat belts while driving.   Please note: You were cared for by a hospitalist during your hospital stay. Once you are discharged, your primary care physician will handle any further medical issues. Please note that NO REFILLS for any discharge medications will be  authorized once you are discharged, as it is imperative that you return to your primary care physician (or establish a relationship with a primary care physician if you do not have one) for your post hospital discharge needs so that they can reassess your need for medications and monitor your lab values  Time coordinating discharge: Over 30 minutes  SIGNED:   Fredia Skeeter, MD  [facility] 02/23/2024, 10:29 AM *Please note that this is a verbal dictation therefore any spelling or grammatical errors are due to the [employer] system interpretation. If 7PM-7AM, please contact night-coverage [email or web address]

## 2024-02-22 NOTE — Progress Notes (Signed)
 PROGRESS NOTE    Marcus Simpson  FMW:[id number] DOB: Jul 10, 1945 DOA: 02/18/2024 PCP: [name], MD   Brief Narrative:  78 year old male with past medical history significant for HTN, DM2, hx nephrolithiasis, asymptomatic bradycardia, prostate cancer who presented to [facility] 11/29 with c/o weakness, febrile to 102.2, hypotensive 70/50. In the ED he was given 3.5L LR with improvement in BP, cultures drawn, and started empirically on Vanc/Zosyn. Patient was recently admitted November 16-26 for sepsis and discharged on Zyvox  and then placed on 6-week course of daptomycin  via PICC line for presumed IE (patient refused TEE).  Patient was admitted to the ICU for management of septic shock patient was placed on pressors broad-spectrum antibiotics with vancomycin  and Zosyn.  Blood and urine cultures positive for Pseudomonas aeruginosa.  CT abdomen and pelvis shows a stable stent with no hydronephrosis, bladder thickening.  X-ray negative for any acute pathology.  His WBC count has improved from 63,000 during admission to 23.5 today.  Urology and ID following.  Assessment & Plan:   Principal Problem:   Septic shock (HCC) Active Problems:   Sepsis secondary to UTI (HCC)   Hydronephrosis with obstructing calculus   Bacteremia due to methicillin resistant Staphylococcus epidermidis   Bacteremia due to Pseudomonas  Septic shock secondary to complicated UTI and Pseudomonas aeruginosa bacteremia, ureteral stone status post ureteral stent, POA: -Recent discharge on 11/24 where he was admitted for sepsis MRSA bacteremia, pyelonephritis obstructing stone, status post stent, discharged with the PICC line on 6 weeks Dapto.  Patient admitted under ICU, required vasopressors.  Urine as well as blood culture both growing Pseudomonas aeruginosa.  Zosyn and vancomycin  transitioned to daptomycin  and cefepime .  Repeat CT scan shows ureteral stent in good position with stable stone.  No further recommendations by  urology, ID following and managing antibiotics.  Repeat blood cultures drawn 02/21/24.  Continue Flomax . - AKI on CKD stage IIIb: Baseline creatinine appears to be around 1.7-1.8 with GFR of around 40.  Creatinine peaked at 2.85 on 02/20/2024, continues to improve and down to 2.2 today, s/p L ureteral stent 2/2 obstructing stone.  AKI likely in setting of ureteral stents and coronary to obstructing stone.  Urology signed off.   Hx prostate ca: Follows with urology.  Hyponatremia: Resolved.  Hypomagnesemia: Resolved.   Hypertension: Controlled, resumed metoprolol .  Hyperlipidemia: Continue Crestor .   Anemia  -follow CBC.  Hemoglobin is stable.   Gout R knee  -effusion tapped last admission, fortunately not c/w septic arthritis.  - Compliant with home colchicine  which we are holding at this time due to rising creatinine.   Diabetes mellitus type II: Recent hemoglobin A1c 6.4 just 2 months ago.  On glipizide at home which is on hold.  Continue SSI, blood sugar controlled.  Switch to consistent carbohydrate diet.  DVT prophylaxis: heparin  injection 5,000 Units Start: 02/18/24 2315 SCDs Start: 02/18/24 2218   Code Status: Full Code  Family Communication:  None present at bedside.  Plan of care discussed with patient in length and he/she verbalized understanding and agreed with it.  Status is: Inpatient Remains inpatient appropriate because: ID recommends waiting for 48 hours for blood culture drawn yesterday.   Estimated body mass index is 32.22 kg/m as calculated from the following:   Height as of this encounter: 5' 11 (1.803 m).   Weight as of this encounter: 104.8 kg.    Nutritional Assessment: Body mass index is 32.22 kg/m.SABRA Seen by dietician.  I agree with the assessment  and plan as outlined below: Nutrition Status:        . Skin Assessment: I have examined the patient's skin and I agree with the wound assessment as performed by the wound care RN as outlined below:     Consultants:  Urology and ID  Procedures:  As above  Antimicrobials:  Anti-infectives (From admission, onward)    Start     Dose/Rate Route Frequency Ordered Stop   02/22/24 1400  DAPTOmycin  (CUBICIN ) IVPB 700 mg/175mL premix        8 mg/kg  89.5 kg (Adjusted) 200 mL/hr over 30 Minutes Intravenous Daily 02/22/24 0740     02/22/24 1000  ceFEPIme  (MAXIPIME ) 2 g in sodium chloride  0.9 % 100 mL IVPB        2 g 200 mL/hr over 30 Minutes Intravenous Every 12 hours 02/22/24 0740     02/20/24 2300  vancomycin  (VANCOREADY) IVPB 1500 mg/300 mL  Status:  Discontinued        1,500 mg 150 mL/hr over 120 Minutes Intravenous Every 48 hours 02/18/24 2239 02/20/24 1017   02/20/24 1400  DAPTOmycin  (CUBICIN ) IVPB 700 mg/139mL premix  Status:  Discontinued        8 mg/kg  89.5 kg (Adjusted) 200 mL/hr over 30 Minutes Intravenous Every 48 hours 02/20/24 1017 02/22/24 0740   02/20/24 1015  ceFEPIme  (MAXIPIME ) 2 g in sodium chloride  0.9 % 100 mL IVPB  Status:  Discontinued        2 g 200 mL/hr over 30 Minutes Intravenous Every 24 hours 02/20/24 0929 02/22/24 0740   02/19/24 0130  piperacillin-tazobactam (ZOSYN) IVPB 3.375 g  Status:  Discontinued        3.375 g 12.5 mL/hr over 240 Minutes Intravenous Every 8 hours 02/18/24 2239 02/20/24 0929   02/18/24 2330  vancomycin  (VANCOREADY) IVPB 1500 mg/300 mL        1,500 mg 150 mL/hr over 120 Minutes Intravenous  Once 02/18/24 2239 02/19/24 0307   02/18/24 1830  cefTRIAXone  (ROCEPHIN ) 2 g in sodium chloride  0.9 % 100 mL IVPB  Status:  Discontinued        2 g 200 mL/hr over 30 Minutes Intravenous Once 02/18/24 1825 02/18/24 1826   02/18/24 1830  vancomycin  (VANCOCIN ) IVPB 1000 mg/200 mL premix        1,000 mg 200 mL/hr over 60 Minutes Intravenous  Once 02/18/24 1826 02/18/24 1956   02/18/24 1830  piperacillin-tazobactam (ZOSYN) IVPB 3.375 g        3.375 g 100 mL/hr over 30 Minutes Intravenous  Once 02/18/24 1826 02/18/24 1907          Subjective: Patient seen and examined, he has no complaints.  Objective: Vitals:   02/21/24 2312 02/22/24 0241 02/22/24 0820 02/22/24 0934  BP: (!) 150/67 (!) 147/74 (!) 157/69 (!) 152/76  Pulse: 69 93  69  Resp: 18 18 20    Temp: 97.9 F (36.6 C) (!) 97.1 F (36.2 C) 98 F (36.7 C)   TempSrc: Oral Oral Oral   SpO2: 95% 96% 97%   Weight:  104.8 kg    Height:        Intake/Output Summary (Last 24 hours) at 02/22/2024 1034 Last data filed at 02/22/2024 0241 Gross per 24 hour  Intake 580 ml  Output 1900 ml  Net -1320 ml   Filed Weights   02/20/24 0439 02/21/24 0324 02/22/24 0241  Weight: 110.9 kg 107.2 kg 104.8 kg    Examination:  General exam: Appears calm and comfortable  Respiratory system: Clear to auscultation. Respiratory effort normal. Cardiovascular system: S1 & S2 heard, RRR. No JVD, murmurs, rubs, gallops or clicks. No pedal edema. Gastrointestinal system: Abdomen is nondistended, soft and nontender. No organomegaly or masses felt. Normal bowel sounds heard. Central nervous system: Alert and oriented. No focal neurological deficits. Extremities: Symmetric 5 x 5 power. Skin: No rashes, lesions or ulcers.  Psychiatry: Judgement and insight appear normal. Mood & affect appropriate.   Data Reviewed: I have personally reviewed following labs and imaging studies  CBC: Recent Labs  Lab 02/18/24 1825 02/18/24 2310 02/19/24 0350 02/20/24 0214 02/21/24 0331 02/22/24 0508  WBC 63.9*  --  49.8* 23.5* 13.9* 11.3*  NEUTROABS 51.5*  --   --   --   --   --   HGB 11.9* 10.2* 9.8* 9.1* 10.1* 9.9*  HCT 36.5* 30.0* 29.7* 27.7* 31.2* 30.0*  MCV 93.1  --  92.5 92.3 92.6 91.7  PLT 422*  --  350 264 253 257   Basic Metabolic Panel: Recent Labs  Lab 02/18/24 2305 02/18/24 2310 02/19/24 0350 02/20/24 0214 02/21/24 0331 02/22/24 0508  NA 131* 132* 132* 134* 136 139  K 4.6 4.9 5.0 4.1 4.3 4.3  CL 97*  --  98 103 104 100  CO2 21*  --  21* 20* 23 24  GLUCOSE 119*   --  127* 120* 78 97  BUN 37*  --  38* 43* 39* 33*  CREATININE 2.83*  --  2.77* 2.85* 2.59* 2.21*  CALCIUM  7.9*  --  8.4* 7.9* 8.5* 9.0  MG 1.2*  --  2.3 2.1 2.2 1.9  PHOS 3.2  --  4.6 4.2 4.0 4.2   GFR: Estimated Creatinine Clearance: 32.8 mL/min (A) (by C-G formula based on SCr of 2.21 mg/dL (H)). Liver Function Tests: Recent Labs  Lab 02/18/24 1825  AST 32  ALT 29  ALKPHOS 62  BILITOT 1.3*  PROT 5.9*  ALBUMIN 3.0*   No results for input(s): LIPASE, AMYLASE in the last 168 hours. No results for input(s): AMMONIA in the last 168 hours. Coagulation Profile: Recent Labs  Lab 02/18/24 1825  INR 1.3*   Cardiac Enzymes: Recent Labs  Lab 02/20/24 1017  CKTOTAL 103   BNP (last 3 results) No results for input(s): PROBNP in the last 8760 hours. HbA1C: No results for input(s): HGBA1C in the last 72 hours. CBG: Recent Labs  Lab 02/21/24 0840 02/21/24 1137 02/21/24 1619 02/21/24 2002 02/22/24 0601  GLUCAP 122* 77 98 110* 97   Lipid Profile: No results for input(s): CHOL, HDL, LDLCALC, TRIG, CHOLHDL, LDLDIRECT in the last 72 hours. Thyroid  Function Tests: No results for input(s): TSH, T4TOTAL, FREET4, T3FREE, THYROIDAB in the last 72 hours. Anemia Panel: No results for input(s): VITAMINB12, FOLATE, FERRITIN, TIBC, IRON, RETICCTPCT in the last 72 hours. Sepsis Labs: Recent Labs  Lab 02/18/24 1825 02/18/24 2041 02/18/24 2219 02/19/24 0350  LATICACIDVEN 5.6* 7.2* 5.0* 1.7    Recent Results (from the past 240 hours)  Resp panel by RT-PCR (RSV, Flu A&B, Covid) Anterior Nasal Swab     Status: None   Collection Time: 02/18/24  6:25 PM   Specimen: Anterior Nasal Swab  Result Value Ref Range Status   SARS Coronavirus 2 by RT PCR NEGATIVE NEGATIVE Final    Comment: (NOTE) SARS-CoV-2 target nucleic acids are NOT DETECTED.  The SARS-CoV-2 RNA is generally detectable in upper respiratory specimens during the acute phase of  infection. The lowest concentration of SARS-CoV-2 viral copies this  assay can detect is 138 copies/mL. A negative result does not preclude SARS-Cov-2 infection and should not be used as the sole basis for treatment or other patient management decisions. A negative result may occur with  improper specimen collection/handling, submission of specimen other than nasopharyngeal swab, presence of viral mutation(s) within the areas targeted by this assay, and inadequate number of viral copies(<138 copies/mL). A negative result must be combined with clinical observations, patient history, and epidemiological information. The expected result is Negative.  Fact Sheet for Patients:  bloggercourse.com  Fact Sheet for Healthcare Providers:  seriousbroker.it  This test is no t yet approved or cleared by the [place]  FDA and  has been authorized for detection and/or diagnosis of SARS-CoV-2 by FDA under an Emergency Use Authorization (EUA). This EUA will remain  in effect (meaning this test can be used) for the duration of the COVID-19 declaration under Section 564(b)(1) of the Act, 21 U.S.C.section 360bbb-3(b)(1), unless the authorization is terminated  or revoked sooner.       Influenza A by PCR NEGATIVE NEGATIVE Final   Influenza B by PCR NEGATIVE NEGATIVE Final    Comment: (NOTE) The Xpert Xpress SARS-CoV-2/FLU/RSV plus assay is intended as an aid in the diagnosis of influenza from Nasopharyngeal swab specimens and should not be used as a sole basis for treatment. Nasal washings and aspirates are unacceptable for Xpert Xpress SARS-CoV-2/FLU/RSV testing.  Fact Sheet for Patients: bloggercourse.com  Fact Sheet for Healthcare Providers: seriousbroker.it  This test is not yet approved or cleared by the [place]  FDA and has been authorized for detection and/or diagnosis of SARS-CoV-2  by FDA under an Emergency Use Authorization (EUA). This EUA will remain in effect (meaning this test can be used) for the duration of the COVID-19 declaration under Section 564(b)(1) of the Act, 21 U.S.C. section 360bbb-3(b)(1), unless the authorization is terminated or revoked.     Resp Syncytial Virus by PCR NEGATIVE NEGATIVE Final    Comment: (NOTE) Fact Sheet for Patients: bloggercourse.com  Fact Sheet for Healthcare Providers: seriousbroker.it  This test is not yet approved or cleared by the [place]  FDA and has been authorized for detection and/or diagnosis of SARS-CoV-2 by FDA under an Emergency Use Authorization (EUA). This EUA will remain in effect (meaning this test can be used) for the duration of the COVID-19 declaration under Section 564(b)(1) of the Act, 21 U.S.C. section 360bbb-3(b)(1), unless the authorization is terminated or revoked.  Performed at [facility], [address], [place], [place] [address]   Blood Culture (routine x 2)     Status: None (Preliminary result)   Collection Time: 02/18/24  6:25 PM   Specimen: BLOOD  Result Value Ref Range Status   Specimen Description BLOOD LEFT ANTECUBITAL  Final   Special Requests   Final    BOTTLES DRAWN AEROBIC AND ANAEROBIC Blood Culture adequate volume   Culture   Final    NO GROWTH 4 DAYS Performed at [facility], [address] [place], [place] [address]    Report Status PENDING  Incomplete  Blood Culture (routine x 2)     Status: Abnormal   Collection Time: 02/18/24  6:30 PM   Specimen: BLOOD RIGHT ARM  Result Value Ref Range Status   Specimen Description   Final    BLOOD RIGHT ARM Performed at [facility], [address] [place], [place] [address]    Special Requests   Final    BOTTLES DRAWN AEROBIC AND ANAEROBIC Blood Culture  adequate volume Performed at [facility], [address] [place], [place] [address]    Culture  Setup Time    Final    AEROBIC BOTTLE ONLY GRAM NEGATIVE RODS Gram Stain Report Called to,Read Back By and Verified With: Y. BALDERAS ON 02/19/2024 @4 :14PM BY T. HAMER  CRITICAL RESULT CALLED TO, READ BACK BY AND VERIFIED WITH: PHARMD J LEDFORD 11//30/2025 @ 2230 BY AB Performed at [facility], [address] [place], [place] [address]    Culture PSEUDOMONAS AERUGINOSA (A)  Final   Report Status 02/21/2024 FINAL  Final   Organism ID, Bacteria PSEUDOMONAS AERUGINOSA  Final      Susceptibility   Pseudomonas aeruginosa - MIC*    MEROPENEM 1 SENSITIVE Sensitive     CIPROFLOXACIN  0.12 SENSITIVE Sensitive     IMIPENEM 4 INTERMEDIATE Intermediate     PIP/TAZO Value in next row Sensitive      16 SENSITIVEThis is a modified FDA-approved test that has been validated and its performance characteristics determined by the reporting laboratory.  This laboratory is certified under the Clinical Laboratory Improvement Amendments CLIA as qualified to perform high complexity clinical laboratory testing.    CEFEPIME  Value in next row Sensitive      16 SENSITIVEThis is a modified FDA-approved test that has been validated and its performance characteristics determined by the reporting laboratory.  This laboratory is certified under the Clinical Laboratory Improvement Amendments CLIA as qualified to perform high complexity clinical laboratory testing.    CEFTAZIDIME/AVIBACTAM Value in next row Sensitive      16 SENSITIVEThis is a modified FDA-approved test that has been validated and its performance characteristics determined by the reporting laboratory.  This laboratory is certified under the Clinical Laboratory Improvement Amendments CLIA as qualified to perform high complexity clinical laboratory testing.    CEFTOLOZANE/TAZOBACTAM Value in next row Sensitive      16 SENSITIVEThis is a modified FDA-approved test that has been validated and its performance characteristics determined by the reporting laboratory.  This  laboratory is certified under the Clinical Laboratory Improvement Amendments CLIA as qualified to perform high complexity clinical laboratory testing.    TOBRAMYCIN Value in next row Sensitive      16 SENSITIVEThis is a modified FDA-approved test that has been validated and its performance characteristics determined by the reporting laboratory.  This laboratory is certified under the Clinical Laboratory Improvement Amendments CLIA as qualified to perform high complexity clinical laboratory testing.    CEFTAZIDIME Value in next row Sensitive      16 SENSITIVEThis is a modified FDA-approved test that has been validated and its performance characteristics determined by the reporting laboratory.  This laboratory is certified under the Clinical Laboratory Improvement Amendments CLIA as qualified to perform high complexity clinical laboratory testing.    * PSEUDOMONAS AERUGINOSA  Blood Culture ID Panel (Reflexed)     Status: Abnormal   Collection Time: 02/18/24  6:30 PM  Result Value Ref Range Status   Enterococcus faecalis NOT DETECTED NOT DETECTED Final   Enterococcus Faecium NOT DETECTED NOT DETECTED Final   Listeria monocytogenes NOT DETECTED NOT DETECTED Final   Staphylococcus species NOT DETECTED NOT DETECTED Final   Staphylococcus aureus (BCID) NOT DETECTED NOT DETECTED Final   Staphylococcus epidermidis NOT DETECTED NOT DETECTED Final   Staphylococcus lugdunensis NOT DETECTED NOT DETECTED Final   Streptococcus species NOT DETECTED NOT DETECTED Final   Streptococcus agalactiae NOT DETECTED NOT DETECTED Final   Streptococcus pneumoniae NOT DETECTED NOT DETECTED Final  Streptococcus pyogenes NOT DETECTED NOT DETECTED Final   A.calcoaceticus-baumannii NOT DETECTED NOT DETECTED Final   Bacteroides fragilis NOT DETECTED NOT DETECTED Final   Enterobacterales NOT DETECTED NOT DETECTED Final   Enterobacter cloacae complex NOT DETECTED NOT DETECTED Final   Escherichia coli NOT DETECTED NOT DETECTED  Final   Klebsiella aerogenes NOT DETECTED NOT DETECTED Final   Klebsiella oxytoca NOT DETECTED NOT DETECTED Final   Klebsiella pneumoniae NOT DETECTED NOT DETECTED Final   Proteus species NOT DETECTED NOT DETECTED Final   Salmonella species NOT DETECTED NOT DETECTED Final   Serratia marcescens NOT DETECTED NOT DETECTED Final   Haemophilus influenzae NOT DETECTED NOT DETECTED Final   Neisseria meningitidis NOT DETECTED NOT DETECTED Final   Pseudomonas aeruginosa DETECTED (A) NOT DETECTED Final    Comment: CRITICAL RESULT CALLED TO, READ BACK BY AND VERIFIED WITH: PHARMD J LEDFORD 11//30/2025 @ 2230 BY AB    Stenotrophomonas maltophilia NOT DETECTED NOT DETECTED Final   Candida albicans NOT DETECTED NOT DETECTED Final   Candida auris NOT DETECTED NOT DETECTED Final   Candida glabrata NOT DETECTED NOT DETECTED Final   Candida krusei NOT DETECTED NOT DETECTED Final   Candida parapsilosis NOT DETECTED NOT DETECTED Final   Candida tropicalis NOT DETECTED NOT DETECTED Final   Cryptococcus neoformans/gattii NOT DETECTED NOT DETECTED Final   CTX-M ESBL NOT DETECTED NOT DETECTED Final   Carbapenem resistance IMP NOT DETECTED NOT DETECTED Final   Carbapenem resistance KPC NOT DETECTED NOT DETECTED Final   Carbapenem resistance NDM NOT DETECTED NOT DETECTED Final   Carbapenem resistance VIM NOT DETECTED NOT DETECTED Final    Comment: Performed at [facility], [address], [place], [place] [address]  MRSA Next Gen by PCR, Nasal     Status: None   Collection Time: 02/18/24  9:57 PM   Specimen: Nasal Mucosa; Nasal Swab  Result Value Ref Range Status   MRSA by PCR Next Gen NOT DETECTED NOT DETECTED Final    Comment: (NOTE) The GeneXpert MRSA Assay (FDA approved for NASAL specimens only), is one component of a comprehensive MRSA colonization surveillance program. It is not intended to diagnose MRSA infection nor to guide or monitor treatment for MRSA infections. Test performance is not  FDA approved in patients less than 69 years old. Performed at [facility], [address], [place], [place] [address]   Urine Culture     Status: Abnormal   Collection Time: 02/18/24 10:45 PM   Specimen: Urine, Random  Result Value Ref Range Status   Specimen Description URINE, RANDOM  Final   Special Requests   Final    NONE Reflexed from [id number] Performed at [facility], [address], [place], [place] [address]    Culture 60,000 COLONIES/mL PSEUDOMONAS AERUGINOSA (A)  Final   Report Status 02/21/2024 FINAL  Final   Organism ID, Bacteria PSEUDOMONAS AERUGINOSA (A)  Final      Susceptibility   Pseudomonas aeruginosa - MIC*    MEROPENEM 1 SENSITIVE Sensitive     CIPROFLOXACIN  0.12 SENSITIVE Sensitive     IMIPENEM 2 SENSITIVE Sensitive     PIP/TAZO Value in next row Sensitive      16 SENSITIVEThis is a modified FDA-approved test that has been validated and its performance characteristics determined by the reporting laboratory.  This laboratory is certified under the Clinical Laboratory Improvement Amendments CLIA as qualified to perform high complexity clinical laboratory testing.    CEFEPIME  Value in next row  Sensitive      16 SENSITIVEThis is a modified FDA-approved test that has been validated and its performance characteristics determined by the reporting laboratory.  This laboratory is certified under the Clinical Laboratory Improvement Amendments CLIA as qualified to perform high complexity clinical laboratory testing.    CEFTAZIDIME/AVIBACTAM Value in next row Sensitive      16 SENSITIVEThis is a modified FDA-approved test that has been validated and its performance characteristics determined by the reporting laboratory.  This laboratory is certified under the Clinical Laboratory Improvement Amendments CLIA as qualified to perform high complexity clinical laboratory testing.    CEFTOLOZANE/TAZOBACTAM Value in next row Sensitive      16 SENSITIVEThis is a modified  FDA-approved test that has been validated and its performance characteristics determined by the reporting laboratory.  This laboratory is certified under the Clinical Laboratory Improvement Amendments CLIA as qualified to perform high complexity clinical laboratory testing.    TOBRAMYCIN Value in next row Sensitive      16 SENSITIVEThis is a modified FDA-approved test that has been validated and its performance characteristics determined by the reporting laboratory.  This laboratory is certified under the Clinical Laboratory Improvement Amendments CLIA as qualified to perform high complexity clinical laboratory testing.    CEFTAZIDIME Value in next row Sensitive      16 SENSITIVEThis is a modified FDA-approved test that has been validated and its performance characteristics determined by the reporting laboratory.  This laboratory is certified under the Clinical Laboratory Improvement Amendments CLIA as qualified to perform high complexity clinical laboratory testing.    * 60,000 COLONIES/mL PSEUDOMONAS AERUGINOSA  Culture, blood (Routine X 2) w Reflex to ID Panel     Status: None (Preliminary result)   Collection Time: 02/21/24  7:33 AM   Specimen: BLOOD RIGHT ARM  Result Value Ref Range Status   Specimen Description BLOOD RIGHT ARM  Final   Special Requests   Final    BOTTLES DRAWN AEROBIC AND ANAEROBIC Blood Culture adequate volume   Culture   Final    NO GROWTH 1 DAY Performed at [facility], [address], [place], [place] [address]    Report Status PENDING  Incomplete  Culture, blood (Routine X 2) w Reflex to ID Panel     Status: None (Preliminary result)   Collection Time: 02/21/24  7:40 AM   Specimen: BLOOD LEFT ARM  Result Value Ref Range Status   Specimen Description BLOOD LEFT ARM  Final   Special Requests   Final    BOTTLES DRAWN AEROBIC AND ANAEROBIC Blood Culture adequate volume   Culture   Final    NO GROWTH 1 DAY Performed at [facility], [address]  [place], [place] [address]    Report Status PENDING  Incomplete     Radiology Studies: No results found.  Scheduled Meds:  acetaminophen   1,000 mg Oral Q8H   aspirin   81 mg Oral Daily   Chlorhexidine  Gluconate Cloth  6 each Topical Daily   cholecalciferol  1,000 Units Oral Daily   vitamin B-12  2,000 mcg Oral Daily   heparin   5,000 Units Subcutaneous Q8H   insulin  aspart  0-15 Units Subcutaneous TID AC & HS   lidocaine   1 patch Transdermal Q24H   melatonin  20 mg Oral QHS   methocarbamol   500 mg Oral TID   metoprolol  succinate  25 mg Oral Daily   [START ON 02/25/2024] rosuvastatin   5 mg Oral Q Sat  tamsulosin   0.4 mg Oral Daily   Continuous Infusions:  ceFEPime  (MAXIPIME ) IV 2 g (02/22/24 0942)   DAPTOmycin        LOS: 4 days   Fredia Skeeter, MD [facility]  02/22/2024, 10:34 AM   *Please note that this is a verbal dictation therefore any spelling or grammatical errors are due to the [employer] One system interpretation.  Please page via Amion and do not message via secure chat for urgent patient care matters. Secure chat can be used for non urgent patient care matters.  How to contact the [facility] Attending or Consulting provider 7A - 7P or covering provider during after hours 7P -7A, for this patient?  Check the care team in [facility] and look for a) attending/consulting [facility] provider listed and b) the [facility] team listed. Page or secure chat 7A-7P. Log into [email or web address] and use [facility] universal password to access. If you do not have the password, please contact the hospital operator. Locate the [facility] provider you are looking for under [facility] and page to a number that you can be directly reached. If you still have difficulty reaching the provider, please page the [facility] (Director on Call) for the Hospitalists listed on amion for assistance.

## 2024-02-23 ENCOUNTER — Other Ambulatory Visit (HOSPITAL_COMMUNITY): Payer: Self-pay

## 2024-02-23 ENCOUNTER — Other Ambulatory Visit: Payer: Self-pay

## 2024-02-23 DIAGNOSIS — B965 Pseudomonas (aeruginosa) (mallei) (pseudomallei) as the cause of diseases classified elsewhere: Secondary | ICD-10-CM | POA: Diagnosis not present

## 2024-02-23 DIAGNOSIS — B9562 Methicillin resistant Staphylococcus aureus infection as the cause of diseases classified elsewhere: Secondary | ICD-10-CM

## 2024-02-23 DIAGNOSIS — R7881 Bacteremia: Secondary | ICD-10-CM | POA: Diagnosis not present

## 2024-02-23 DIAGNOSIS — N390 Urinary tract infection, site not specified: Secondary | ICD-10-CM | POA: Diagnosis not present

## 2024-02-23 LAB — CULTURE, BLOOD (ROUTINE X 2)
Culture: NO GROWTH
Special Requests: ADEQUATE

## 2024-02-23 LAB — BASIC METABOLIC PANEL WITH GFR
Anion gap: 10 (ref 5–15)
BUN: 28 mg/dL — ABNORMAL HIGH (ref 8–23)
CO2: 22 mmol/L (ref 22–32)
Calcium: 8.5 mg/dL — ABNORMAL LOW (ref 8.9–10.3)
Chloride: 104 mmol/L (ref 98–111)
Creatinine, Ser: 1.87 mg/dL — ABNORMAL HIGH (ref 0.61–1.24)
GFR, Estimated: 36 mL/min — ABNORMAL LOW (ref >60)
Glucose, Bld: 111 mg/dL — ABNORMAL HIGH (ref 70–99)
Potassium: 3.9 mmol/L (ref 3.5–5.1)
Sodium: 136 mmol/L (ref 135–145)

## 2024-02-23 LAB — PHOSPHORUS: Phosphorus: 3.9 mg/dL (ref 2.5–4.6)

## 2024-02-23 LAB — CBC
HCT: 31 % — ABNORMAL LOW (ref 39.0–52.0)
Hemoglobin: 10.1 g/dL — ABNORMAL LOW (ref 13.0–17.0)
MCH: 29.7 pg (ref 26.0–34.0)
MCHC: 32.6 g/dL (ref 30.0–36.0)
MCV: 91.2 fL (ref 80.0–100.0)
Platelets: 263 K/uL (ref 150–400)
RBC: 3.4 MIL/uL — ABNORMAL LOW (ref 4.22–5.81)
RDW: 13.6 % (ref 11.5–15.5)
WBC: 8 K/uL (ref 4.0–10.5)
nRBC: 0 % (ref 0.0–0.2)

## 2024-02-23 LAB — MAGNESIUM: Magnesium: 1.8 mg/dL (ref 1.7–2.4)

## 2024-02-23 LAB — GLUCOSE, CAPILLARY
Glucose-Capillary: 96 mg/dL (ref 70–99)
Glucose-Capillary: 107 mg/dL — ABNORMAL HIGH (ref 70–99)

## 2024-02-23 MED ORDER — SODIUM CHLORIDE 0.9% FLUSH: 10.0000 mL | INTRAVENOUS | Status: DC | PRN

## 2024-02-23 MED ORDER — AMLODIPINE BESYLATE 5 MG PO TABS
5.0000 mg | ORAL_TABLET | Freq: Every day | ORAL | 0 refills | Status: DC
  Filled 2024-02-23: qty 30, 30d supply, fill #0

## 2024-02-23 MED ORDER — SODIUM CHLORIDE 0.9% FLUSH: 10.0000 mL | Freq: Two times a day (BID) | INTRAVENOUS | Status: DC

## 2024-02-23 MED ORDER — CIPROFLOXACIN HCL 750 MG PO TABS
750.0000 mg | ORAL_TABLET | Freq: Two times a day (BID) | ORAL | Status: DC
  Administered 2024-02-23: 750 mg via ORAL
  Filled 2024-02-23 (×2): qty 1

## 2024-02-23 MED ORDER — DAPTOMYCIN IV (FOR PTA / DISCHARGE USE ONLY): 700.0000 mg | INTRAVENOUS | 0 refills | Status: DC

## 2024-02-23 MED ORDER — AMLODIPINE BESYLATE 5 MG PO TABS
5.0000 mg | ORAL_TABLET | Freq: Every day | ORAL | Status: DC
  Administered 2024-02-23: 5 mg via ORAL
  Filled 2024-02-23: qty 1

## 2024-02-23 MED ORDER — CIPROFLOXACIN HCL 750 MG PO TABS
750.0000 mg | ORAL_TABLET | Freq: Two times a day (BID) | ORAL | 0 refills | Status: AC
  Filled 2024-02-23: qty 18, 9d supply, fill #0

## 2024-02-23 NOTE — Progress Notes (Signed)
 Peripherally Inserted Central Catheter Placement  The IV Nurse has discussed with the patient and/or persons authorized to consent for the patient, the purpose of this procedure and the potential benefits and risks involved with this procedure.  The benefits include less needle sticks, lab draws from the catheter, and the patient may be discharged home with the catheter. Risks include, but not limited to, infection, bleeding, blood clot (thrombus formation), and puncture of an artery; nerve damage and irregular heartbeat and possibility to perform a PICC exchange if needed/ordered by physician.  Alternatives to this procedure were also discussed.  Bard Power PICC patient education guide, fact sheet on infection prevention and patient information card has been provided to patient /or left at bedside.    PICC Placement Documentation  PICC Single Lumen 02/23/24 Right Brachial 39 cm 0 cm (Active)  Indication for Insertion or Continuance of Line Prolonged intravenous therapies 02/23/24 1454  Exposed Catheter (cm) 0 cm 02/23/24 1454  Site Assessment Clean, Dry, Intact 02/23/24 1454  Line Status Flushed;Blood return noted;Saline locked 02/23/24 1454  Dressing Type Transparent 02/23/24 1454  Dressing Status Antimicrobial disc/dressing in place 02/23/24 1454  Line Care Connections checked and tightened 02/23/24 1454  Line Adjustment (NICU/IV Team Only) No 02/23/24 1454  Dressing Intervention New dressing 02/23/24 1454  Dressing Change Due 03/01/24 02/23/24 1454       Marcus Simpson 02/23/2024, 2:55 PM

## 2024-02-23 NOTE — TOC Transition Note (Signed)
 Transition of Care [facility]) - Discharge Note   Patient Details  Name: Marcus Simpson MRN: [id number] Date of Birth: Dec 26, 1945  [facility] CM/SW Contact:  Roxie KANDICE Stain, RN Phone Number: 02/23/2024, 3:26 PM   Clinical Narrative:    Patient stable for dischage.  [facility] will provide home health RN and iv antibiotics.  [facility] will provide home health PT.  Spouse can provide transportation.     Final next level of care: Home w Home Health Services Barriers to Discharge: Barriers Resolved   Patient Goals and CMS Choice Patient states their goals for this hospitalization and ongoing recovery are:: return home          Discharge Placement                   home    Discharge Plan and Services Additional resources added to the After Visit Summary for                            HH Arranged: RN, PT, OT, IV Antibiotics HH Agency: [facility] Date [facility] Agency Contacted: 02/23/24 Time HH Agency Contacted: 1521 Representative spoke with at [facility] Agency: Pam/Shelia  Social Drivers of Health (SDOH) Interventions SDOH Screenings   Food Insecurity: No Food Insecurity (02/19/2024)  Housing: Low Risk  (02/19/2024)  Transportation Needs: No Transportation Needs (02/19/2024)  Utilities: Not At Risk (02/19/2024)  Alcohol Screen: Low Risk  (10/11/2023)  Depression (PHQ2-9): Low Risk  (01/19/2024)  Social Connections: Moderately Integrated (02/19/2024)  Recent Concern: Social Connections - Moderately Isolated (02/05/2024)  Tobacco Use: Medium Risk (02/19/2024)     Readmission Risk Interventions    02/23/2024    3:25 PM  Readmission Risk Prevention Plan  Transportation Screening Complete  PCP or Specialist Appt within 3-5 Days Complete  HRI or Home Care Consult Complete  Social Work Consult for Recovery Care Planning/Counseling Complete  Palliative Care Screening Not Applicable  Medication Review Oceanographer) Complete

## 2024-02-23 NOTE — Progress Notes (Signed)
 [facility] Infectious Diseases Follow Up Note  Patient Identification: Patient Name: Marcus Simpson MRN: [id number] Admit Date: 02/18/2024  6:15 PM Age: 78 y.o.Today's Date: 02/23/2024  Reason for Visit: Follow-up on bacteremia  Principal Problem:   Septic shock [facility]) Active Problems:   Sepsis secondary to UTI [facility])   Hydronephrosis with obstructing calculus   Bacteremia due to methicillin resistant Staphylococcus epidermidis   Bacteremia due to Pseudomonas   Antibiotics: Cefepime  12/1- Daptomycin  12/1- Total days of antibiotics 6  Lines/Hardwares:   Interval Events: Remains afebrile Labs remarkable for creatinine improved to 1.87, hemoglobin 10.1 12/2 repeat blood culture no growth in 2 days  Assessment 78 year old male with prior history as below including prostrate CA, HTN, DM 2, kidney stones, arthritis, bradycardia with recent admission for MRSA bacteremia on empiric treatment for endocarditis with daptomycin  through PICC admitted initially with septic shock.  Found to have  # Pseudomonas aeruginosa bacteremia/Complicated UTI  - 12/2 repeat blood culture no growth in 2 days - 11/29 urine culture 60,000 colonies of Pseudomonas aeruginosa - 11/16 cystoscopy, left retrograde pyelogram, left ureteral stent placement  # Prior MRSE bacteremia/presumptive endocarditis  Recommendations - Continue daptomycin  to complete prior course of endocarditis - Will switch to p.o. ciprofloxacin  to complete 2 weeks course for Pseudomonas aeruginosa bacteremia in the setting of recent ureteral stent placement. Qtc 433. No significant DDIs noted.  - Monitor CBC, BMP and CPK - Universal/standard isolation precautions D/w Dr Marcus Simpson will sign off  OPAT  Diagnosis: Pseudomonas aeruginosa bacteremia, recent MRSE bacteremia  Culture Result: MRSA, Pseudomonas aeruginosa  Allergies  Allergen Reactions   Dilaudid [Hydromorphone  Hcl] Anaphylaxis and Other (See Comments)    Cardiac arrest    OPAT Orders Discharge antibiotics to be given via PICC line Discharge antibiotics: Daptomycin  700 mg IV every 24 hours plus ciprofloxacin  750 mg twice daily Per pharmacy protocol Duration: EOT for daptomycin  12/30, EOT for ciprofloxacin  12/14  PIC Care Per Protocol:  Home health RN for IV administration and teaching; PICC line care and labs.    Labs weekly while on IV antibiotics: X__ CBC with differential __ BMP X__ CMP __ CRP __ ESR __ Vancomycin  trough X__ CK  X__ Please pull PIC at completion of IV antibiotics __ Please leave PIC in place until doctor has seen patient or been notified  Fax weekly labs to [phone]  Clinic Follow Up Appt: 12/23 at 9: 30 am    Rest of the management as per the primary team. Thank you for the consult. Please page with pertinent questions or concerns.  ______________________________________________________________________ Subjective patient seen and examined at the bedside.  No complaints, eager to go home.  Past Medical History:  Diagnosis Date   Arthritis    Bradycardia    asymptomatic    Diabetes mellitus without complication (HCC)    History of kidney stones    Hypertension    Prostate cancer [facility])    Past Surgical History:  Procedure Laterality Date   COLONOSCOPY     CYSTOSCOPY WITH STENT PLACEMENT Right 12/28/2022   Procedure: CYSTOSCOPY WITH STENT PLACEMENT;  Surgeon: Marcus Sherwood [name], Simpson;  Location: [facility] OR;  Service: Urology;  Laterality: Right;   CYSTOSCOPY WITH STENT PLACEMENT Left 02/05/2024   Procedure: CYSTOSCOPY, WITH STENT INSERTION. URETHRAL DILATION;  Surgeon: Marcus Steffan BROCKS, Simpson;  Location: [facility] ORS;  Service: Urology;  Laterality: Left;   EXTRACORPOREAL SHOCK WAVE LITHOTRIPSY Right 01/25/2023   Procedure: EXTRACORPOREAL SHOCK WAVE LITHOTRIPSY (ESWL);  Surgeon: Marcus Simpson;  Location: [facility] ORS;  Service: Urology;  Laterality: Right;    FLEXIBLE SIGMOIDOSCOPY N/A 04/02/2015   Procedure: FLEXIBLE SIGMOIDOSCOPY;  Surgeon: Marcus RAYMOND Rivet, Simpson;  Location: [facility] SUITE;  Service: Endoscopy;  Laterality: N/A;  1:45 - moved to 1/11 @ 8:30 - Marcus Simpson to notify   KIDNEY STONE SURGERY     RADIOACTIVE SEED IMPLANT N/A 12/29/2023   Procedure: INSERTION, RADIATION SOURCE, PROSTATE;  Surgeon: Sherrilee Belvie CROME, Simpson;  Location: [facility] ORS;  Service: Urology;  Laterality: N/A;   SPACE OAR INSTILLATION N/A 12/29/2023   Procedure: INJECTION, HYDROGEL SPACER;  Surgeon: Sherrilee Belvie CROME, Simpson;  Location: [facility] ORS;  Service: Urology;  Laterality: N/A;   TOTAL KNEE ARTHROPLASTY Left 05/02/2017   Procedure: LEFT TOTAL KNEE ARTHROPLASTY;  Surgeon: Melodi Lerner, Simpson;  Location: [facility] ORS;  Service: Orthopedics;  Laterality: Left;    Vitals BP (!) 179/74 (BP Location: Right Arm)   Pulse 70   Temp 99.2 F (37.3 C) (Oral)   Resp 19   Ht 5' 11 (1.803 m)   Wt 103.8 kg   SpO2 96%   BMI 31.92 kg/m     Physical Exam Constitutional: Elderly male sitting in the recliner, comfortable    Comments:   Cardiovascular:     Rate and Rhythm: Normal rate     Heart sounds:   Pulmonary:     Effort: Pulmonary effort is normal.     Comments:   Abdominal:     Palpations: Abdomen is nondistended    Tenderness:   Musculoskeletal:        General: No swelling or tenderness in peripheral joints  Skin:    Comments: No rashes  Neurological:     General: Awake, alert and oriented, grossly nonfocal  Psychiatric:        Mood and Affect: Mood normal.   Pertinent Microbiology Results for orders placed or performed during the hospital encounter of 02/18/24  Resp panel by RT-PCR (RSV, Flu A&B, Covid) Anterior Nasal Swab     Status: None   Collection Time: 02/18/24  6:25 PM   Specimen: Anterior Nasal Swab  Result Value Ref Range Status   SARS Coronavirus 2 by RT PCR NEGATIVE NEGATIVE Final    Comment: (NOTE) SARS-CoV-2 target nucleic acids are NOT  DETECTED.  The SARS-CoV-2 RNA is generally detectable in upper respiratory specimens during the acute phase of infection. The lowest concentration of SARS-CoV-2 viral copies this assay can detect is 138 copies/mL. A negative result does not preclude SARS-Cov-2 infection and should not be used as the sole basis for treatment or other patient management decisions. A negative result may occur with  improper specimen collection/handling, submission of specimen other than nasopharyngeal swab, presence of viral mutation(s) within the areas targeted by this assay, and inadequate number of viral copies(<138 copies/mL). A negative result must be combined with clinical observations, patient history, and epidemiological information. The expected result is Negative.  Fact Sheet for Patients:  bloggercourse.com  Fact Sheet for Healthcare Providers:  seriousbroker.it  This test is no t yet approved or cleared by the [place]  FDA and  has been authorized for detection and/or diagnosis of SARS-CoV-2 by FDA under an Emergency Use Authorization (EUA). This EUA will remain  in effect (meaning this test can be used) for the duration of the COVID-19 declaration under Section 564(b)(1) of the Act, 21 U.S.C.section 360bbb-3(b)(1), unless the authorization is terminated  or revoked sooner.       Influenza A by  PCR NEGATIVE NEGATIVE Final   Influenza B by PCR NEGATIVE NEGATIVE Final    Comment: (NOTE) The Xpert Xpress SARS-CoV-2/FLU/RSV plus assay is intended as an aid in the diagnosis of influenza from Nasopharyngeal swab specimens and should not be used as a sole basis for treatment. Nasal washings and aspirates are unacceptable for Xpert Xpress SARS-CoV-2/FLU/RSV testing.  Fact Sheet for Patients: bloggercourse.com  Fact Sheet for Healthcare Providers: seriousbroker.it  This test is not yet  approved or cleared by the [place]  FDA and has been authorized for detection and/or diagnosis of SARS-CoV-2 by FDA under an Emergency Use Authorization (EUA). This EUA will remain in effect (meaning this test can be used) for the duration of the COVID-19 declaration under Section 564(b)(1) of the Act, 21 U.S.C. section 360bbb-3(b)(1), unless the authorization is terminated or revoked.     Resp Syncytial Virus by PCR NEGATIVE NEGATIVE Final    Comment: (NOTE) Fact Sheet for Patients: bloggercourse.com  Fact Sheet for Healthcare Providers: seriousbroker.it  This test is not yet approved or cleared by the [place]  FDA and has been authorized for detection and/or diagnosis of SARS-CoV-2 by FDA under an Emergency Use Authorization (EUA). This EUA will remain in effect (meaning this test can be used) for the duration of the COVID-19 declaration under Section 564(b)(1) of the Act, 21 U.S.C. section 360bbb-3(b)(1), unless the authorization is terminated or revoked.  Performed at [facility], [address], [place], [place] [address]   Blood Culture (routine x 2)     Status: None (Preliminary result)   Collection Time: 02/18/24  6:25 PM   Specimen: BLOOD  Result Value Ref Range Status   Specimen Description BLOOD LEFT ANTECUBITAL  Final   Special Requests   Final    BOTTLES DRAWN AEROBIC AND ANAEROBIC Blood Culture adequate volume   Culture   Final    NO GROWTH 4 DAYS Performed at [facility], [address], [place], [place] [address]    Report Status PENDING  Incomplete  Blood Culture (routine x 2)     Status: Abnormal   Collection Time: 02/18/24  6:30 PM   Specimen: BLOOD RIGHT ARM  Result Value Ref Range Status   Specimen Description   Final    BLOOD RIGHT ARM Performed at [facility], [address], [place], [place] [address]    Special Requests   Final    BOTTLES DRAWN AEROBIC AND ANAEROBIC Blood Culture  adequate volume Performed at [facility], [address], [place], [place] [address]    Culture  Setup Time   Final    AEROBIC BOTTLE ONLY GRAM NEGATIVE RODS Gram Stain Report Called to,Read Back By and Verified With: Y. BALDERAS ON 02/19/2024 @4 :14PM BY T. HAMER  CRITICAL RESULT CALLED TO, READ BACK BY AND VERIFIED WITH: PHARMD J LEDFORD 11//30/2025 @ 2230 BY AB Performed at [facility], [address], [place], [place] [address]    Culture PSEUDOMONAS AERUGINOSA (A)  Final   Report Status 02/21/2024 FINAL  Final   Organism Simpson, Bacteria PSEUDOMONAS AERUGINOSA  Final      Susceptibility   Pseudomonas aeruginosa - MIC*    MEROPENEM 1 SENSITIVE Sensitive     CIPROFLOXACIN  0.12 SENSITIVE Sensitive     IMIPENEM 4 INTERMEDIATE Intermediate     PIP/TAZO Value in next row Sensitive      16 SENSITIVEThis is a modified FDA-approved test that has been validated and its performance characteristics determined by the reporting laboratory.  This laboratory is certified under the Clinical Laboratory Improvement Amendments CLIA as qualified to perform high complexity clinical laboratory testing.    CEFEPIME  Value in next row Sensitive      16 SENSITIVEThis is a modified FDA-approved test that has been validated and its performance characteristics determined by the reporting laboratory.  This laboratory is certified under the Clinical Laboratory Improvement Amendments CLIA as qualified to perform high complexity clinical laboratory testing.    CEFTAZIDIME/AVIBACTAM Value in next row Sensitive      16 SENSITIVEThis is a modified FDA-approved test that has been validated and its performance characteristics determined by the reporting laboratory.  This laboratory is certified under the Clinical Laboratory Improvement Amendments CLIA as qualified to perform high complexity clinical laboratory testing.    CEFTOLOZANE/TAZOBACTAM Value in next row Sensitive      16 SENSITIVEThis is a modified FDA-approved  test that has been validated and its performance characteristics determined by the reporting laboratory.  This laboratory is certified under the Clinical Laboratory Improvement Amendments CLIA as qualified to perform high complexity clinical laboratory testing.    TOBRAMYCIN Value in next row Sensitive      16 SENSITIVEThis is a modified FDA-approved test that has been validated and its performance characteristics determined by the reporting laboratory.  This laboratory is certified under the Clinical Laboratory Improvement Amendments CLIA as qualified to perform high complexity clinical laboratory testing.    CEFTAZIDIME Value in next row Sensitive      16 SENSITIVEThis is a modified FDA-approved test that has been validated and its performance characteristics determined by the reporting laboratory.  This laboratory is certified under the Clinical Laboratory Improvement Amendments CLIA as qualified to perform high complexity clinical laboratory testing.    * PSEUDOMONAS AERUGINOSA  Blood Culture Simpson Panel (Reflexed)     Status: Abnormal   Collection Time: 02/18/24  6:30 PM  Result Value Ref Range Status   Enterococcus faecalis NOT DETECTED NOT DETECTED Final   Enterococcus Faecium NOT DETECTED NOT DETECTED Final   Listeria monocytogenes NOT DETECTED NOT DETECTED Final   Staphylococcus species NOT DETECTED NOT DETECTED Final   Staphylococcus aureus (BCID) NOT DETECTED NOT DETECTED Final   Staphylococcus epidermidis NOT DETECTED NOT DETECTED Final   Staphylococcus lugdunensis NOT DETECTED NOT DETECTED Final   Streptococcus species NOT DETECTED NOT DETECTED Final   Streptococcus agalactiae NOT DETECTED NOT DETECTED Final   Streptococcus pneumoniae NOT DETECTED NOT DETECTED Final   Streptococcus pyogenes NOT DETECTED NOT DETECTED Final   A.calcoaceticus-baumannii NOT DETECTED NOT DETECTED Final   Bacteroides fragilis NOT DETECTED NOT DETECTED Final   Enterobacterales NOT DETECTED NOT DETECTED Final    Enterobacter cloacae complex NOT DETECTED NOT DETECTED Final   Escherichia coli NOT DETECTED NOT DETECTED Final   Klebsiella aerogenes NOT DETECTED NOT DETECTED Final   Klebsiella oxytoca NOT DETECTED NOT DETECTED Final   Klebsiella pneumoniae NOT DETECTED NOT DETECTED Final   Proteus species NOT DETECTED NOT DETECTED Final   Salmonella species NOT DETECTED NOT DETECTED Final   Serratia marcescens NOT DETECTED NOT DETECTED Final   Haemophilus influenzae NOT DETECTED NOT DETECTED Final   Neisseria meningitidis NOT DETECTED NOT DETECTED Final   Pseudomonas aeruginosa DETECTED (A) NOT DETECTED Final    Comment: CRITICAL RESULT CALLED TO, READ BACK BY AND VERIFIED WITH: PHARMD J LEDFORD 11//30/2025 @ 2230 BY AB    Stenotrophomonas maltophilia NOT DETECTED NOT DETECTED Final   Candida albicans NOT DETECTED NOT DETECTED Final   Candida auris NOT  DETECTED NOT DETECTED Final   Candida glabrata NOT DETECTED NOT DETECTED Final   Candida krusei NOT DETECTED NOT DETECTED Final   Candida parapsilosis NOT DETECTED NOT DETECTED Final   Candida tropicalis NOT DETECTED NOT DETECTED Final   Cryptococcus neoformans/gattii NOT DETECTED NOT DETECTED Final   CTX-M ESBL NOT DETECTED NOT DETECTED Final   Carbapenem resistance IMP NOT DETECTED NOT DETECTED Final   Carbapenem resistance KPC NOT DETECTED NOT DETECTED Final   Carbapenem resistance NDM NOT DETECTED NOT DETECTED Final   Carbapenem resistance VIM NOT DETECTED NOT DETECTED Final    Comment: Performed at [facility], [address] [place], [place] [address]  MRSA Next Gen by PCR, Nasal     Status: None   Collection Time: 02/18/24  9:57 PM   Specimen: Nasal Mucosa; Nasal Swab  Result Value Ref Range Status   MRSA by PCR Next Gen NOT DETECTED NOT DETECTED Final    Comment: (NOTE) The GeneXpert MRSA Assay (FDA approved for NASAL specimens only), is one component of a comprehensive MRSA colonization surveillance program. It is not  intended to diagnose MRSA infection nor to guide or monitor treatment for MRSA infections. Test performance is not FDA approved in patients less than 50 years old. Performed at [facility], [address], [place], [place] [address]   Urine Culture     Status: Abnormal   Collection Time: 02/18/24 10:45 PM   Specimen: Urine, Random  Result Value Ref Range Status   Specimen Description URINE, RANDOM  Final   Special Requests   Final    NONE Reflexed from [id number] Performed at [facility], [address], [place], [place] [address]    Culture 60,000 COLONIES/mL PSEUDOMONAS AERUGINOSA (A)  Final   Report Status 02/21/2024 FINAL  Final   Organism Simpson, Bacteria PSEUDOMONAS AERUGINOSA (A)  Final      Susceptibility   Pseudomonas aeruginosa - MIC*    MEROPENEM 1 SENSITIVE Sensitive     CIPROFLOXACIN  0.12 SENSITIVE Sensitive     IMIPENEM 2 SENSITIVE Sensitive     PIP/TAZO Value in next row Sensitive      16 SENSITIVEThis is a modified FDA-approved test that has been validated and its performance characteristics determined by the reporting laboratory.  This laboratory is certified under the Clinical Laboratory Improvement Amendments CLIA as qualified to perform high complexity clinical laboratory testing.    CEFEPIME  Value in next row Sensitive      16 SENSITIVEThis is a modified FDA-approved test that has been validated and its performance characteristics determined by the reporting laboratory.  This laboratory is certified under the Clinical Laboratory Improvement Amendments CLIA as qualified to perform high complexity clinical laboratory testing.    CEFTAZIDIME/AVIBACTAM Value in next row Sensitive      16 SENSITIVEThis is a modified FDA-approved test that has been validated and its performance characteristics determined by the reporting laboratory.  This laboratory is certified under the Clinical Laboratory Improvement Amendments CLIA as qualified to perform high complexity clinical  laboratory testing.    CEFTOLOZANE/TAZOBACTAM Value in next row Sensitive      16 SENSITIVEThis is a modified FDA-approved test that has been validated and its performance characteristics determined by the reporting laboratory.  This laboratory is certified under the Clinical Laboratory Improvement Amendments CLIA as qualified to perform high complexity clinical laboratory testing.    TOBRAMYCIN Value in next row Sensitive      16 SENSITIVEThis is a modified FDA-approved test  that has been validated and its performance characteristics determined by the reporting laboratory.  This laboratory is certified under the Clinical Laboratory Improvement Amendments CLIA as qualified to perform high complexity clinical laboratory testing.    CEFTAZIDIME Value in next row Sensitive      16 SENSITIVEThis is a modified FDA-approved test that has been validated and its performance characteristics determined by the reporting laboratory.  This laboratory is certified under the Clinical Laboratory Improvement Amendments CLIA as qualified to perform high complexity clinical laboratory testing.    * 60,000 COLONIES/mL PSEUDOMONAS AERUGINOSA  Culture, blood (Routine X 2) w Reflex to Simpson Panel     Status: None (Preliminary result)   Collection Time: 02/21/24  7:33 AM   Specimen: BLOOD RIGHT ARM  Result Value Ref Range Status   Specimen Description BLOOD RIGHT ARM  Final   Special Requests   Final    BOTTLES DRAWN AEROBIC AND ANAEROBIC Blood Culture adequate volume   Culture   Final    NO GROWTH 1 DAY Performed at [facility], [address], [place], [place] [address]    Report Status PENDING  Incomplete  Culture, blood (Routine X 2) w Reflex to Simpson Panel     Status: None (Preliminary result)   Collection Time: 02/21/24  7:40 AM   Specimen: BLOOD LEFT ARM  Result Value Ref Range Status   Specimen Description BLOOD LEFT ARM  Final   Special Requests   Final    BOTTLES DRAWN AEROBIC AND ANAEROBIC Blood  Culture adequate volume   Culture   Final    NO GROWTH 1 DAY Performed at [facility], [address] [place], [place] [address]    Report Status PENDING  Incomplete   Pertinent Lab.    Latest Ref Rng & Units 02/23/2024    3:42 AM 02/22/2024    5:08 AM 02/21/2024    3:31 AM  CBC  WBC 4.0 - 10.5 K/uL 8.0  11.3  13.9   Hemoglobin 13.0 - 17.0 g/dL 89.8  9.9  89.8   Hematocrit 39.0 - 52.0 % 31.0  30.0  31.2   Platelets 150 - 400 K/uL 263  257  253       Latest Ref Rng & Units 02/23/2024    3:42 AM 02/22/2024    5:08 AM 02/21/2024    3:31 AM  CMP  Glucose 70 - 99 mg/dL 888  97  78   BUN 8 - 23 mg/dL 28  33  39   Creatinine 0.61 - 1.24 mg/dL 8.12  7.78  7.40   Sodium 135 - 145 mmol/L 136  139  136   Potassium 3.5 - 5.1 mmol/L 3.9  4.3  4.3   Chloride 98 - 111 mmol/L 104  100  104   CO2 22 - 32 mmol/L 22  24  23    Calcium  8.9 - 10.3 mg/dL 8.5  9.0  8.5      Pertinent Imaging today Plain films and CT images have been personally visualized and interpreted; radiology reports have been reviewed. Decision making incorporated into the Impression  No results found.  I spent 50 minutes involved in face-to-face and non-face-to-face activities for this patient on the day of the visit. Professional time spent includes the following activities: Preparing to see the patient (review of tests), Obtaining and reviewing separately obtained history (hospitalist progress note), Performing a medically appropriate examination and evaluation, Ordering medications/PICC, referring and communicating with other health care professionals,  Documenting clinical information in the EMR, Independently interpreting results (not separately reported), Communicating results to the patient, Counseling and educating the patient and Care coordination (not separately reported).     Plan d/w requesting provider as well as Simpson pharm D  Of note, portions of this note may have been created with voice recognition  software. While this note has been edited for accuracy, occasional wrong-word or 'sound-a-like' substitutions may have occurred due to the inherent limitations of voice recognition software.   Electronically signed by:   [name], [facility] Infectious Disease Physician [facility] Pager: [phone]

## 2024-02-23 NOTE — Evaluation (Signed)
 Physical Therapy Evaluation Patient Details Name: Marcus Simpson MRN: [id number] DOB: [birth year] Today's Date: 02/23/2024  History of Present Illness  Pt is an 78 year old man admitted on 02/18/24 with fever and weakness secondary to UTI/cystitis and AKI. PMH: HTN, DM2, chronic anemia, prostate ca, CKD, recent admission with L acute pyleonephritis, L uretal stone with placement of stent, MRSE bacteremia.  Clinical Impression  Pt is currently presenting at Mod I for sit to stand with an without an AD, Mod I for short distance gait up to Min A for longer distances due to fatigue. Pt requires verbal cues for pacing and standing rest breaks in order to prevent falls related to fatigue. Family is able to assist at home. Pt has one set and can hold onto door way to get into house. Pt reports he has RW at home and educated to use RW initially for fatigue. Due to pt current functional status, home set up and available assistance at home no recommended skilled physical therapy services at this time on discharge from acute care hospital setting. Will continue to follow in acute setting in order to ensure that pt returns home with decreased risk for falls, injury, re-hospitalization and improved activity tolerance.          If plan is discharge home, recommend the following: Help with stairs or ramp for entrance;Other (comment);Assist for transportation (as needed)     Equipment Recommendations None recommended by PT     Functional Status Assessment Patient has had a recent decline in their functional status and demonstrates the ability to make significant improvements in function in a reasonable and predictable amount of time.     Precautions / Restrictions Precautions Precautions: Fall Restrictions Weight Bearing Restrictions Per Provider Order: No      Mobility  Bed Mobility     General bed mobility comments: received in chair    Transfers Overall transfer level: Modified  independent Equipment used: None Transfers: Sit to/from Stand Sit to Stand: Modified independent (Device/Increase time)      Ambulation/Gait Ambulation/Gait assistance: Supervision, Min assist Gait Distance (Feet): 300 Feet Assistive device: 1 person hand held assist, None Gait Pattern/deviations: Step-through pattern, Decreased stride length, Step-to pattern, Antalgic, Trunk flexed Gait velocity: decreased Gait velocity interpretation: <1.8 ft/sec, indicate of risk for recurrent falls   General Gait Details: Cues for upright posture. Pt with fatigue after ~ 100 ft. Holding onto rail in hall. Pt requiring cues for standing rest breaks. HHA after ~225 ft with Min A for balance/weakness.    Balance Overall balance assessment: Mild deficits observed, not formally tested           Standing balance-Leahy Scale: Fair (after 225 ft of gait pt becomes fatigued and requires external support.)       Pertinent Vitals/Pain Pain Assessment Pain Assessment: No/denies pain    Home Living Family/patient expects to be discharged to:: Private residence Living Arrangements: Spouse/significant other Available Help at Discharge: Family;Available 24 hours/day Type of Home: House Home Access: Stairs to enter   [employer] of Steps: 1   Home Layout: One level Home Equipment: Agricultural Consultant (2 wheels);Cane - single point      Prior Function Prior Level of Function : Independent/Modified Independent           Extremity/Trunk Assessment   Upper Extremity Assessment Upper Extremity Assessment: Defer to OT evaluation    Lower Extremity Assessment Lower Extremity Assessment: Generalized weakness    Cervical / Trunk Assessment Cervical / Trunk  Assessment: Kyphotic  Communication   Communication Communication: Impaired Factors Affecting Communication: Hearing impaired    Cognition Arousal: Alert Behavior During Therapy: WFL for tasks assessed/performed   PT -  Cognitive impairments: No apparent impairments   Following commands: Intact       Cueing Cueing Techniques: Verbal cues     General Comments General comments (skin integrity, edema, etc.): HR 94%, HR 119 bpm        Assessment/Plan    PT Assessment Patient needs continued PT services  PT Problem List Decreased mobility;Decreased strength       PT Treatment Interventions DME instruction;Gait training;Functional mobility training;Therapeutic activities;Patient/family education;Balance training    PT Goals (Current goals can be found in the Care Plan section)  Acute Rehab PT Goals Patient Stated Goal: home soon PT Goal Formulation: With patient Time For Goal Achievement: 03/08/24 Potential to Achieve Goals: Good    Frequency Min 3X/week        AM-PAC PT 6 Clicks Mobility  Outcome Measure Help needed turning from your back to your side while in a flat bed without using bedrails?: None Help needed moving from lying on your back to sitting on the side of a flat bed without using bedrails?: None Help needed moving to and from a bed to a chair (including a wheelchair)?: None Help needed standing up from a chair using your arms (e.g., wheelchair or bedside chair)?: None Help needed to walk in hospital room?: None Help needed climbing 3-5 steps with a railing? : A Little 6 Click Score: 23    End of Session Equipment Utilized During Treatment: Gait belt Activity Tolerance: Patient tolerated treatment well;Patient limited by fatigue Patient left: in chair;with call bell/phone within reach;with family/visitor present Nurse Communication: Mobility status PT Visit Diagnosis: Muscle weakness (generalized) (M62.81);Unsteadiness on feet (R26.81)    Time: 8850-8799 PT Time Calculation (min) (ACUTE ONLY): 11 min   Charges:   PT Evaluation $PT Eval Low Complexity: 1 Low   PT General Charges $$ ACUTE PT VISIT: 1 Visit        Dorothyann Maier, DPT, CLT  Acute  Rehabilitation Services Office: [phone] (Secure chat preferred)  Dorothyann VEAR Maier 02/23/2024, 2:10 PM

## 2024-02-23 NOTE — Evaluation (Signed)
 Occupational Therapy Evaluation and Discharge Patient Details Name: Marcus Simpson MRN: [id number] DOB: 10-Dec-1945 Today's Date: 02/23/2024   History of Present Illness   Pt is an 78 year old man admitted on 02/18/24 with fever and weakness secondary to UTI/cystitis and AKI. PMH: HTN, DM2, chronic anemia, prostate ca, CKD, recent admission with L acute pyleonephritis, L uretal stone with placement of stent, MRSE bacteremia.     Clinical Impressions Pt typically ambulates and completes basic ADLs independently. Pt expressing strong desire to go home. Ambulated in room/bathroom with CGA and IV pole. Managed toileting modified independently. Pt with noted hand weakness needing assistance to open containers, reports he uses a jar opener at home. Pt declined further OT stating, Everything is going to be alright as soon as I get home. Pt will return home with his wife who can provide assistance as needed.      If plan is discharge home, recommend the following:   A little help with bathing/dressing/bathroom;Assistance with cooking/housework;Assist for transportation;Help with stairs or ramp for entrance     Functional Status Assessment   Patient has had a recent decline in their functional status and demonstrates the ability to make significant improvements in function in a reasonable and predictable amount of time.     Equipment Recommendations   None recommended by OT     Recommendations for Other Services         Precautions/Restrictions   Precautions Precautions: Fall     Mobility Bed Mobility               General bed mobility comments: received in chair    Transfers Overall transfer level: Needs assistance   Transfers: Sit to/from Stand Sit to Stand: Contact guard assist           General transfer comment: pushed IV pole      Balance Overall balance assessment: Needs assistance   Sitting balance-Leahy Scale: Good       Standing  balance-Leahy Scale: Fair                             ADL either performed or assessed with clinical judgement   ADL Overall ADL's : Needs assistance/impaired Eating/Feeding: Independent;Sitting   Grooming: Contact guard assist;Standing   Upper Body Bathing: Set up;Sitting   Lower Body Bathing: Contact guard assist;Sit to/from stand   Upper Body Dressing : Set up;Sitting   Lower Body Dressing: Contact guard assist;Sit to/from stand   Toilet Transfer: Contact guard Designer, Fashion/clothing- Clothing Manipulation and Hygiene: Modified independent;Sitting/lateral lean       Functional mobility during ADLs: Contact guard assist       Vision Baseline Vision/History: 1 Wears glasses Ability to See in Adequate Light: 0 Adequate Patient Visual Report: No change from baseline       Perception         Praxis         Pertinent Vitals/Pain Pain Assessment Pain Assessment: Faces Faces Pain Scale: No hurt     Extremity/Trunk Assessment Upper Extremity Assessment Upper Extremity Assessment: Overall WFL for tasks assessed;Right hand dominant       Cervical / Trunk Assessment Cervical / Trunk Assessment: Normal   Communication Communication Communication: Impaired Factors Affecting Communication: Hearing impaired   Cognition Arousal: Alert Behavior During Therapy: Flat affect Cognition: No apparent impairments             OT - Cognition Comments: pt  highly focused on returning home                 Following commands: Intact       Cueing  General Comments   Cueing Techniques: Verbal cues      Exercises     Shoulder Instructions      Home Living Family/patient expects to be discharged to:: Private residence Living Arrangements: Spouse/significant other Available Help at Discharge: Family;Available 24 hours/day Type of Home: House Home Access: Stairs to enter [employer] of Steps: 1   Home  Layout: One level     Bathroom Shower/Tub: Producer, Television/film/video: Handicapped height     Home Equipment: Agricultural Consultant (2 wheels);Cane - single point          Prior Functioning/Environment Prior Level of Function : Independent/Modified Independent                    OT Problem List: Impaired balance (sitting and/or standing)   OT Treatment/Interventions:        OT Goals(Current goals can be found in the care plan section)       OT Frequency:       Co-evaluation              AM-PAC OT 6 Clicks Daily Activity     Outcome Measure Help from another person eating meals?: None Help from another person taking care of personal grooming?: A Little Help from another person toileting, which includes using toliet, bedpan, or urinal?: A Little Help from another person bathing (including washing, rinsing, drying)?: A Little Help from another person to put on and taking off regular upper body clothing?: None Help from another person to put on and taking off regular lower body clothing?: A Little 6 Click Score: 20   End of Session    Activity Tolerance: Patient tolerated treatment well Patient left: in chair;with call bell/phone within reach;with nursing/sitter in room  OT Visit Diagnosis: Unsteadiness on feet (R26.81);Muscle weakness (generalized) (M62.81)                Time: 9088-9073 OT Time Calculation (min): 15 min Charges:  OT General Charges $OT Visit: 1 Visit OT Evaluation $OT Eval Low Complexity: 1 Low  Marcus Simpson, OTR/L Acute Rehabilitation Services Office: [phone]  Marcus Simpson 02/23/2024, 9:43 AM

## 2024-02-26 LAB — CBC WITH DIFFERENTIAL/PLATELET
Abs Immature Granulocytes: 6.58 K/uL — ABNORMAL HIGH (ref 0.00–0.07)
Basophils Absolute: 0.2 K/uL — ABNORMAL HIGH (ref 0.0–0.1)
Basophils Relative: 0 %
Eosinophils Absolute: 0 K/uL (ref 0.0–0.5)
Eosinophils Relative: 0 %
HCT: 36.5 % — ABNORMAL LOW (ref 39.0–52.0)
Hemoglobin: 11.9 g/dL — ABNORMAL LOW (ref 13.0–17.0)
Immature Granulocytes: 10 %
Lymphocytes Relative: 3 %
Lymphs Abs: 1.8 K/uL (ref 0.7–4.0)
MCH: 30.4 pg (ref 26.0–34.0)
MCHC: 32.6 g/dL (ref 30.0–36.0)
MCV: 93.1 fL (ref 80.0–100.0)
Monocytes Absolute: 3.9 K/uL — ABNORMAL HIGH (ref 0.1–1.0)
Monocytes Relative: 6 %
Neutro Abs: 51.5 K/uL — ABNORMAL HIGH (ref 1.7–7.7)
Neutrophils Relative %: 81 %
Platelets: 422 K/uL — ABNORMAL HIGH (ref 150–400)
RBC: 3.92 MIL/uL — ABNORMAL LOW (ref 4.22–5.81)
RDW: 13.3 % (ref 11.5–15.5)
WBC: 63.9 K/uL (ref 4.0–10.5)
nRBC: 0 % (ref 0.0–0.2)

## 2024-02-26 LAB — CULTURE, BLOOD (ROUTINE X 2)
Culture: NO GROWTH
Culture: NO GROWTH
Special Requests: ADEQUATE
Special Requests: ADEQUATE

## 2024-02-29 ENCOUNTER — Other Ambulatory Visit: Payer: Self-pay

## 2024-02-29 ENCOUNTER — Inpatient Hospital Stay (HOSPITAL_COMMUNITY)
Admission: RE | Admit: 2024-02-29 | Discharge: 2024-02-29 | Disposition: A | Source: Ambulatory Visit | Attending: Urology

## 2024-02-29 ENCOUNTER — Encounter (HOSPITAL_COMMUNITY): Payer: Self-pay

## 2024-02-29 NOTE — Progress Notes (Signed)
 PAT phone call completed.  Patient to arrive at 0830.  [place]arden will be driver.  Wife will also be with patient.

## 2024-03-01 ENCOUNTER — Ambulatory Visit (HOSPITAL_COMMUNITY)

## 2024-03-01 ENCOUNTER — Ambulatory Visit (HOSPITAL_COMMUNITY): Admitting: Certified Registered"

## 2024-03-01 ENCOUNTER — Encounter (HOSPITAL_COMMUNITY): Admission: RE | Disposition: A | Payer: Self-pay | Source: Home / Self Care | Attending: Urology

## 2024-03-01 ENCOUNTER — Encounter (HOSPITAL_COMMUNITY): Payer: Self-pay | Admitting: Urology

## 2024-03-01 ENCOUNTER — Other Ambulatory Visit: Payer: Self-pay

## 2024-03-01 ENCOUNTER — Inpatient Hospital Stay: Payer: Self-pay | Admitting: Internal Medicine

## 2024-03-01 ENCOUNTER — Ambulatory Visit (HOSPITAL_COMMUNITY): Admission: RE | Admit: 2024-03-01 | Discharge: 2024-03-01 | Disposition: A | Attending: Urology | Admitting: Urology

## 2024-03-01 DIAGNOSIS — N201 Calculus of ureter: Secondary | ICD-10-CM

## 2024-03-01 DIAGNOSIS — Z87891 Personal history of nicotine dependence: Secondary | ICD-10-CM | POA: Diagnosis not present

## 2024-03-01 DIAGNOSIS — I10 Essential (primary) hypertension: Secondary | ICD-10-CM

## 2024-03-01 HISTORY — PX: HOLMIUM LASER APPLICATION: SHX5852

## 2024-03-01 HISTORY — PX: CYSTOSCOPY/URETEROSCOPY/HOLMIUM LASER/STENT PLACEMENT: SHX6546

## 2024-03-01 HISTORY — PX: CYSTOSCOPY/RETROGRADE/URETEROSCOPY/STONE EXTRACTION WITH BASKET: SHX5317

## 2024-03-01 LAB — GLUCOSE, CAPILLARY: Glucose-Capillary: 164 mg/dL — ABNORMAL HIGH (ref 70–99)

## 2024-03-01 SURGERY — CYSTOSCOPY/URETEROSCOPY/HOLMIUM LASER/STENT PLACEMENT
Anesthesia: General | Site: Ureter | Laterality: Left

## 2024-03-01 MED ORDER — LIDOCAINE 2% (20 MG/ML) 5 ML SYRINGE
INTRAMUSCULAR | Status: DC | PRN
  Administered 2024-03-01: 60 mg via INTRAVENOUS

## 2024-03-01 MED ORDER — FENTANYL CITRATE (PF) 100 MCG/2ML IJ SOLN
INTRAMUSCULAR | Status: AC
  Filled 2024-03-01: qty 2

## 2024-03-01 MED ORDER — SODIUM CHLORIDE 0.9 % IR SOLN
Status: DC | PRN
  Administered 2024-03-01: 3000 mL

## 2024-03-01 MED ORDER — DIATRIZOATE MEGLUMINE 30 % UR SOLN
URETHRAL | Status: DC | PRN
  Administered 2024-03-01: 6 mL via URETHRAL

## 2024-03-01 MED ORDER — CHLORHEXIDINE GLUCONATE 0.12 % MT SOLN: 15.0000 mL | Freq: Once | OROMUCOSAL | Status: DC

## 2024-03-01 MED ORDER — DIATRIZOATE MEGLUMINE 30 % UR SOLN
URETHRAL | Status: AC
  Filled 2024-03-01: qty 100

## 2024-03-01 MED ORDER — ORAL CARE MOUTH RINSE: 15.0000 mL | Freq: Once | OROMUCOSAL | Status: DC

## 2024-03-01 MED ORDER — ONDANSETRON HCL 4 MG/2ML IJ SOLN
INTRAMUSCULAR | Status: AC
  Filled 2024-03-01: qty 2

## 2024-03-01 MED ORDER — FENTANYL CITRATE (PF) 100 MCG/2ML IJ SOLN
INTRAMUSCULAR | Status: DC | PRN
  Administered 2024-03-01 (×3): 25 ug via INTRAVENOUS

## 2024-03-01 MED ORDER — ONDANSETRON HCL 4 MG/2ML IJ SOLN
INTRAMUSCULAR | Status: DC | PRN
  Administered 2024-03-01: 4 mg via INTRAVENOUS

## 2024-03-01 MED ORDER — LACTATED RINGERS IV SOLN: INTRAVENOUS | Status: DC

## 2024-03-01 MED ORDER — CEFAZOLIN SODIUM-DEXTROSE 2-4 GM/100ML-% IV SOLN
2.0000 g | INTRAVENOUS | Status: AC
  Administered 2024-03-01: 2 g via INTRAVENOUS
  Filled 2024-03-01: qty 100

## 2024-03-01 MED ORDER — LIDOCAINE 2% (20 MG/ML) 5 ML SYRINGE
INTRAMUSCULAR | Status: AC
  Filled 2024-03-01: qty 5

## 2024-03-01 MED ORDER — FENTANYL CITRATE (PF) 50 MCG/ML IJ SOSY: 25.0000 ug | PREFILLED_SYRINGE | INTRAMUSCULAR | Status: DC | PRN

## 2024-03-01 MED ORDER — CHLORHEXIDINE GLUCONATE 0.12 % MT SOLN
15.0000 mL | Freq: Once | OROMUCOSAL | Status: AC
  Administered 2024-03-01: 15 mL via OROMUCOSAL

## 2024-03-01 MED ORDER — PROPOFOL 500 MG/50ML IV EMUL
INTRAVENOUS | Status: DC | PRN
  Administered 2024-03-01: 60 mg via INTRAVENOUS
  Administered 2024-03-01: 100 mg via INTRAVENOUS

## 2024-03-01 MED ORDER — OXYCODONE HCL 5 MG PO TABS: 5.0000 mg | ORAL_TABLET | Freq: Once | ORAL | Status: DC | PRN

## 2024-03-01 MED ORDER — OXYCODONE-ACETAMINOPHEN 5-325 MG PO TABS: 1.0000 | ORAL_TABLET | ORAL | 0 refills | Status: DC | PRN

## 2024-03-01 MED ORDER — WATER FOR IRRIGATION, STERILE IR SOLN
Status: DC | PRN
  Administered 2024-03-01: 1000 mL

## 2024-03-01 MED ORDER — ARTIFICIAL TEARS OPHTHALMIC OINT
TOPICAL_OINTMENT | OPHTHALMIC | Status: AC
  Filled 2024-03-01: qty 3.5

## 2024-03-01 MED ORDER — ONDANSETRON HCL 4 MG/2ML IJ SOLN: 4.0000 mg | Freq: Once | INTRAMUSCULAR | Status: DC | PRN

## 2024-03-01 MED ORDER — OXYCODONE HCL 5 MG/5ML PO SOLN: 5.0000 mg | Freq: Once | ORAL | Status: DC | PRN

## 2024-03-01 MED ORDER — PHENYLEPHRINE 80 MCG/ML (10ML) SYRINGE FOR IV PUSH (FOR BLOOD PRESSURE SUPPORT)
PREFILLED_SYRINGE | INTRAVENOUS | Status: DC | PRN
  Administered 2024-03-01: 160 ug via INTRAVENOUS
  Administered 2024-03-01 (×2): 80 ug via INTRAVENOUS

## 2024-03-01 SURGICAL SUPPLY — 24 items
BAG DRAIN URO TABLE W/ADPT NS (BAG) ×1 IMPLANT
BAG HAMPER (MISCELLANEOUS) ×1 IMPLANT
BASKET NITINOL 4 WIRE 16 (BASKET) ×1 IMPLANT
CATH URETL OPEN END 6FR 70 (CATHETERS) ×1 IMPLANT
CLOTH BEACON ORANGE TIMEOUT ST (SAFETY) ×1 IMPLANT
EXTRACTOR STONE NITINOL NGAGE (UROLOGICAL SUPPLIES) ×1 IMPLANT
GLOVE BIO SURGEON STRL SZ8 (GLOVE) ×1 IMPLANT
GLOVE BIOGEL PI IND STRL 7.0 (GLOVE) ×2 IMPLANT
GOWN STRL REUS W/TWL LRG LVL3 (GOWN DISPOSABLE) ×1 IMPLANT
GOWN STRL REUS W/TWL XL LVL3 (GOWN DISPOSABLE) ×1 IMPLANT
GUIDEWIRE STR DUAL SENSOR (WIRE) ×1 IMPLANT
GUIDEWIRE STR ZIPWIRE 035X150 (MISCELLANEOUS) ×1 IMPLANT
KIT TURNOVER CYSTO (KITS) ×1 IMPLANT
MANIFOLD NEPTUNE II (INSTRUMENTS) ×1 IMPLANT
PACK CYSTO (CUSTOM PROCEDURE TRAY) ×1 IMPLANT
PAD ARMBOARD POSITIONER FOAM (MISCELLANEOUS) ×1 IMPLANT
POSITIONER HEAD 8X9X4 ADT (SOFTGOODS) ×1 IMPLANT
SHEATH NAVIGATOR HD 11/13X36 (SHEATH) ×1 IMPLANT
SOL .9 NS 3000ML IRR UROMATIC (IV SOLUTION) ×2 IMPLANT
STENT URET 6FRX26 CONTOUR (STENTS) IMPLANT
SYR 10ML LL (SYRINGE) ×1 IMPLANT
TOWEL OR 17X26 4PK STRL BLUE (TOWEL DISPOSABLE) ×1 IMPLANT
TRACTIP FLEXIVA PULS ID 200XHI (Laser) IMPLANT
WATER STERILE IRR 500ML POUR (IV SOLUTION) ×1 IMPLANT

## 2024-03-01 NOTE — Anesthesia Procedure Notes (Signed)
 Procedure Name: LMA Insertion Date/Time: 03/01/2024 10:17 AM  Performed by: Para Jerelene CROME, CRNAPre-anesthesia Checklist: Patient identified, Emergency Drugs available, Suction available and Patient being monitored Patient Re-evaluated:Patient Re-evaluated prior to induction Oxygen Delivery Method: Circle system utilized Preoxygenation: Pre-oxygenation with 100% oxygen Induction Type: IV induction Ventilation: Mask ventilation without difficulty LMA: LMA inserted Laryngoscope Size: 4 Number of attempts: 2 Placement Confirmation: positive ETCO2, CO2 detector and breath sounds checked- equal and bilateral ETT to lip (cm): LMA seated properly after second attempt. No markings on Ambu Aurastraight LMA size 4. Tube secured with: Tape Dental Injury: Teeth and Oropharynx as per pre-operative assessment  Comments: Atraumatic insertion of LMA size 4. Patient edentulous. Left midline crease between upper and lower lip dry and cracked with blood observed. Area cleaned and lubricated.

## 2024-03-01 NOTE — Op Note (Signed)
.  Preoperative diagnosis: Left ureteral stone  Postoperative diagnosis: Same  Procedure: 1 cystoscopy 2. Left retrograde pyelography 3.  Intraoperative fluoroscopy, under one hour, with interpretation 4.  Left ureteroscopic stone manipulation with laser lithotripsy 5.  Left 6 x 26 JJ stent placement  Attending: Belvie Standing  Anesthesia: General  Estimated blood loss: None  Drains: Left 6 x 26 JJ ureteral stent without tether  Specimens: none  Antibiotics: ancef   Findings: left proximal ureteral stone. No hydronephrosis. No masses/lesions in the bladder. Ureteral orifices in normal anatomic location.  Indications: Patient is a 78 year old male with a history of left ureteral stone who underwent stent placement 3 weeks ago.  After discussing treatment options, he decided proceed with left ureteroscopic stone manipulation.  Procedure in detail: The patient was brought to the operating room and a brief timeout was done to ensure correct patient, correct procedure, correct site.  General anesthesia was administered patient was placed in dorsal lithotomy position.  Her genitalia was then prepped and draped in usual sterile fashion.  A rigid 22 French cystoscope was passed in the urethra and the bladder.  Bladder was inspected free masses or lesions.  the ureteral orifices were in the normal orthotopic locations.  a 6 french ureteral catheter was then instilled into the left ureteral orifice.  a gentle retrograde was obtained and findings noted above. Using a grasper the left ureteral stent was brought to the urethral meatus. we then placed a zip wire through the ureteral stent and advanced up to the renal pelvis.  we then removed the cystoscope and cannulated the left ureteral orifice with a semirigid ureteroscope.  We were unable to advance the scope over the pelvis. We advanced a sensor wire up to the renal pelvis. We then removed the ureteroscope and advanced a flexible ureteroscope up to  the stone. Using a 242nm laser fiber we fragmente dthe stone and since the stone was soft it dusted. once all stone fragments were removed we then removed the ureteroscope. We then placed a 6 x 26 double-j ureteral stent over the original zip wire.  We then removed the wire and good coil was noted in the the renal pelvis under fluoroscopy and the bladder under direct vision. the bladder was then drained and this concluded the procedure which was well tolerated by patient.  Complications: None  Condition: Stable, extubated, transferred to PACU  Plan: Patient is to be discharged home as to follow-up in 2 weeks for stent removal.

## 2024-03-01 NOTE — Interval H&P Note (Signed)
 History and Physical Interval Note:  03/01/2024 9:44 AM  Marcus Simpson  has presented today for surgery, with the diagnosis of left ureteral stone.  The various methods of treatment have been discussed with the patient and family. After consideration of risks, benefits and other options for treatment, the patient has consented to  Procedures: CYSTOSCOPY/URETEROSCOPY/HOLMIUM LASER/STENT PLACEMENT (Left) HOLMIUM LASER APPLICATION (Left) as a surgical intervention.  The patient's history has been reviewed, patient examined, no change in status, stable for surgery.  I have reviewed the patient's chart and labs.  Questions were answered to the patient's satisfaction.     [name]

## 2024-03-01 NOTE — Anesthesia Preprocedure Evaluation (Signed)
 Anesthesia Evaluation  Patient identified by MRN, date of birth, ID band Patient awake    Reviewed: Allergy & Precautions, H&P , NPO status , Patient's Chart, lab work & pertinent test results, reviewed documented beta blocker date and time   Airway Mallampati: II  TM Distance: >3 FB Neck ROM: full    Dental no notable dental hx.    Pulmonary neg pulmonary ROS, former smoker   Pulmonary exam normal breath sounds clear to auscultation       Cardiovascular Exercise Tolerance: Good hypertension, negative cardio ROS  Rhythm:regular Rate:Normal     Neuro/Psych negative neurological ROS  negative psych ROS   GI/Hepatic negative GI ROS, Neg liver ROS,,,  Endo/Other  negative endocrine ROSdiabetes    Renal/GU Renal diseasenegative Renal ROS  negative genitourinary   Musculoskeletal   Abdominal   Peds  Hematology negative hematology ROS (+) Blood dyscrasia, anemia   Anesthesia Other Findings   Reproductive/Obstetrics negative OB ROS                              Anesthesia Physical Anesthesia Plan  ASA: 3  Anesthesia Plan: General and General LMA   Post-op Pain Management:    Induction:   PONV Risk Score and Plan: Ondansetron   Airway Management Planned:   Additional Equipment:   Intra-op Plan:   Post-operative Plan:   Informed Consent: I have reviewed the patients History and Physical, chart, labs and discussed the procedure including the risks, benefits and alternatives for the proposed anesthesia with the patient or authorized representative who has indicated his/her understanding and acceptance.     Dental Advisory Given  Plan Discussed with: CRNA  Anesthesia Plan Comments:         Anesthesia Quick Evaluation

## 2024-03-01 NOTE — Discharge Instructions (Signed)
 Plan: Patient is to be discharged home as to follow-up in 2 weeks for stent removal.

## 2024-03-01 NOTE — Transfer of Care (Signed)
 Immediate Anesthesia Transfer of Care Note  Patient: Marcus Simpson  Procedure(s) Performed: CYSTOSCOPY/URETEROSCOPY/HOLMIUM LASER/STENT PLACEMENT (Left: Ureter) HOLMIUM LASER APPLICATION (Left: Ureter) CYSTOSCOPY, WITH CALCULUS REMOVAL USING BASKET (Left: Ureter)  Patient Location: PACU  Anesthesia Type:General  Level of Consciousness: drowsy and patient cooperative  Airway & Oxygen Therapy: Patient Spontanous Breathing and Patient connected to face mask oxygen  Post-op Assessment: Report given to RN and Post -op Vital signs reviewed and stable  Post vital signs: Reviewed and stable  Last Vitals:  Vitals Value Taken Time  BP 122/68 03/01/24 11:05  Temp 36.9 C 03/01/24 11:05  Pulse 75 03/01/24 11:08  Resp 11 03/01/24 11:08  SpO2 100 % 03/01/24 11:08  Vitals shown include unfiled device data.  Last Pain:  Vitals:   03/01/24 1105  TempSrc:   PainSc: 0-No pain      Patients Stated Pain Goal: 8 (03/01/24 0924)  Complications: No notable events documented.

## 2024-03-02 ENCOUNTER — Encounter (HOSPITAL_COMMUNITY): Payer: Self-pay | Admitting: Urology

## 2024-03-07 ENCOUNTER — Emergency Department (HOSPITAL_COMMUNITY)

## 2024-03-07 ENCOUNTER — Inpatient Hospital Stay (HOSPITAL_COMMUNITY)
Admission: EM | Admit: 2024-03-07 | Discharge: 2024-03-17 | DRG: 682 | Disposition: A | Discharge status: Home / Self Care | Attending: Internal Medicine | Admitting: Internal Medicine

## 2024-03-07 ENCOUNTER — Encounter (HOSPITAL_COMMUNITY): Payer: Self-pay

## 2024-03-07 ENCOUNTER — Other Ambulatory Visit: Payer: Self-pay

## 2024-03-07 ENCOUNTER — Telehealth: Payer: Self-pay | Admitting: Pharmacist

## 2024-03-07 DIAGNOSIS — Z79899 Other long term (current) drug therapy: Secondary | ICD-10-CM

## 2024-03-07 DIAGNOSIS — Z1629 Resistance to other single specified antibiotic: Secondary | ICD-10-CM | POA: Diagnosis present

## 2024-03-07 DIAGNOSIS — E872 Acidosis, unspecified: Secondary | ICD-10-CM | POA: Diagnosis present

## 2024-03-07 DIAGNOSIS — Y95 Nosocomial condition: Secondary | ICD-10-CM | POA: Diagnosis present

## 2024-03-07 DIAGNOSIS — N184 Chronic kidney disease, stage 4 (severe): Secondary | ICD-10-CM | POA: Diagnosis not present

## 2024-03-07 DIAGNOSIS — E119 Type 2 diabetes mellitus without complications: Secondary | ICD-10-CM | POA: Diagnosis not present

## 2024-03-07 DIAGNOSIS — N179 Acute kidney failure, unspecified: Principal | ICD-10-CM | POA: Diagnosis present

## 2024-03-07 DIAGNOSIS — M1990 Unspecified osteoarthritis, unspecified site: Secondary | ICD-10-CM | POA: Diagnosis present

## 2024-03-07 DIAGNOSIS — Z7984 Long term (current) use of oral hypoglycemic drugs: Secondary | ICD-10-CM

## 2024-03-07 DIAGNOSIS — Z6830 Body mass index (BMI) 30.0-30.9, adult: Secondary | ICD-10-CM

## 2024-03-07 DIAGNOSIS — R7881 Bacteremia: Secondary | ICD-10-CM | POA: Diagnosis present

## 2024-03-07 DIAGNOSIS — R531 Weakness: Secondary | ICD-10-CM

## 2024-03-07 DIAGNOSIS — N201 Calculus of ureter: Secondary | ICD-10-CM | POA: Diagnosis present

## 2024-03-07 DIAGNOSIS — J181 Lobar pneumonia, unspecified organism: Secondary | ICD-10-CM

## 2024-03-07 DIAGNOSIS — E860 Dehydration: Secondary | ICD-10-CM | POA: Diagnosis present

## 2024-03-07 DIAGNOSIS — B957 Other staphylococcus as the cause of diseases classified elsewhere: Secondary | ICD-10-CM | POA: Diagnosis not present

## 2024-03-07 DIAGNOSIS — E785 Hyperlipidemia, unspecified: Secondary | ICD-10-CM | POA: Diagnosis present

## 2024-03-07 DIAGNOSIS — N1832 Chronic kidney disease, stage 3b: Secondary | ICD-10-CM | POA: Diagnosis present

## 2024-03-07 DIAGNOSIS — B370 Candidal stomatitis: Secondary | ICD-10-CM | POA: Diagnosis present

## 2024-03-07 DIAGNOSIS — D631 Anemia in chronic kidney disease: Secondary | ICD-10-CM | POA: Diagnosis present

## 2024-03-07 DIAGNOSIS — N132 Hydronephrosis with renal and ureteral calculous obstruction: Secondary | ICD-10-CM | POA: Diagnosis present

## 2024-03-07 DIAGNOSIS — Z885 Allergy status to narcotic agent status: Secondary | ICD-10-CM

## 2024-03-07 DIAGNOSIS — Z7982 Long term (current) use of aspirin: Secondary | ICD-10-CM

## 2024-03-07 DIAGNOSIS — M179 Osteoarthritis of knee, unspecified: Secondary | ICD-10-CM | POA: Diagnosis present

## 2024-03-07 DIAGNOSIS — E1122 Type 2 diabetes mellitus with diabetic chronic kidney disease: Secondary | ICD-10-CM | POA: Diagnosis present

## 2024-03-07 DIAGNOSIS — N200 Calculus of kidney: Secondary | ICD-10-CM

## 2024-03-07 DIAGNOSIS — R627 Adult failure to thrive: Secondary | ICD-10-CM | POA: Diagnosis present

## 2024-03-07 DIAGNOSIS — D509 Iron deficiency anemia, unspecified: Secondary | ICD-10-CM | POA: Diagnosis present

## 2024-03-07 DIAGNOSIS — I129 Hypertensive chronic kidney disease with stage 1 through stage 4 chronic kidney disease, or unspecified chronic kidney disease: Secondary | ICD-10-CM | POA: Diagnosis present

## 2024-03-07 DIAGNOSIS — J189 Pneumonia, unspecified organism: Secondary | ICD-10-CM | POA: Diagnosis present

## 2024-03-07 DIAGNOSIS — Z87891 Personal history of nicotine dependence: Secondary | ICD-10-CM

## 2024-03-07 DIAGNOSIS — I10 Essential (primary) hypertension: Secondary | ICD-10-CM | POA: Diagnosis present

## 2024-03-07 DIAGNOSIS — Z96652 Presence of left artificial knee joint: Secondary | ICD-10-CM | POA: Diagnosis present

## 2024-03-07 DIAGNOSIS — Z8546 Personal history of malignant neoplasm of prostate: Secondary | ICD-10-CM

## 2024-03-07 DIAGNOSIS — G9341 Metabolic encephalopathy: Secondary | ICD-10-CM | POA: Diagnosis not present

## 2024-03-07 DIAGNOSIS — J690 Pneumonitis due to inhalation of food and vomit: Secondary | ICD-10-CM | POA: Diagnosis not present

## 2024-03-07 DIAGNOSIS — D638 Anemia in other chronic diseases classified elsewhere: Secondary | ICD-10-CM | POA: Diagnosis present

## 2024-03-07 DIAGNOSIS — C61 Malignant neoplasm of prostate: Secondary | ICD-10-CM | POA: Diagnosis present

## 2024-03-07 LAB — PROTIME-INR
INR: 1.5 — ABNORMAL HIGH (ref 0.8–1.2)
Prothrombin Time: 19.2 s — ABNORMAL HIGH (ref 11.4–15.2)

## 2024-03-07 LAB — CBC WITH DIFFERENTIAL/PLATELET
Abs Immature Granulocytes: 0.11 K/uL — ABNORMAL HIGH (ref 0.00–0.07)
Basophils Absolute: 0 K/uL (ref 0.0–0.1)
Basophils Relative: 0 %
Eosinophils Absolute: 0.6 K/uL — ABNORMAL HIGH (ref 0.0–0.5)
Eosinophils Relative: 7 %
HCT: 34 % — ABNORMAL LOW (ref 39.0–52.0)
Hemoglobin: 10.9 g/dL — ABNORMAL LOW (ref 13.0–17.0)
Immature Granulocytes: 1 %
Lymphocytes Relative: 10 %
Lymphs Abs: 0.9 K/uL (ref 0.7–4.0)
MCH: 28.7 pg (ref 26.0–34.0)
MCHC: 32.1 g/dL (ref 30.0–36.0)
MCV: 89.5 fL (ref 80.0–100.0)
Monocytes Absolute: 1 K/uL (ref 0.1–1.0)
Monocytes Relative: 11 %
Neutro Abs: 6.4 K/uL (ref 1.7–7.7)
Neutrophils Relative %: 71 %
Platelets: 497 K/uL — ABNORMAL HIGH (ref 150–400)
RBC: 3.8 MIL/uL — ABNORMAL LOW (ref 4.22–5.81)
RDW: 15.3 % (ref 11.5–15.5)
WBC: 9.1 K/uL (ref 4.0–10.5)
nRBC: 0 % (ref 0.0–0.2)

## 2024-03-07 LAB — COMPREHENSIVE METABOLIC PANEL WITH GFR
ALT: 13 U/L (ref 0–44)
AST: 59 U/L — ABNORMAL HIGH (ref 15–41)
Albumin: 3.2 g/dL — ABNORMAL LOW (ref 3.5–5.0)
Alkaline Phosphatase: 135 U/L — ABNORMAL HIGH (ref 38–126)
Anion gap: 22 — ABNORMAL HIGH (ref 5–15)
BUN: 84 mg/dL — ABNORMAL HIGH (ref 8–23)
CO2: 16 mmol/L — ABNORMAL LOW (ref 22–32)
Calcium: 10.2 mg/dL (ref 8.9–10.3)
Chloride: 100 mmol/L (ref 98–111)
Creatinine, Ser: 6.87 mg/dL — ABNORMAL HIGH (ref 0.61–1.24)
GFR, Estimated: 8 mL/min — ABNORMAL LOW (ref >60)
Glucose, Bld: 97 mg/dL (ref 70–99)
Potassium: 4.2 mmol/L (ref 3.5–5.1)
Sodium: 137 mmol/L (ref 135–145)
Total Bilirubin: 0.9 mg/dL (ref 0.0–1.2)
Total Protein: 8.3 g/dL — ABNORMAL HIGH (ref 6.5–8.1)

## 2024-03-07 LAB — URINALYSIS, W/ REFLEX TO CULTURE (INFECTION SUSPECTED)
Bilirubin Urine: NEGATIVE
Glucose, UA: NEGATIVE mg/dL
Ketones, ur: NEGATIVE mg/dL
Nitrite: NEGATIVE
Protein, ur: 100 mg/dL — AB
RBC / HPF: >50 RBC/hpf (ref 0–5)
Specific Gravity, Urine: 1.012 (ref 1.005–1.030)
pH: 6 (ref 5.0–8.0)

## 2024-03-07 LAB — GLUCOSE, CAPILLARY
Glucose-Capillary: 126 mg/dL — ABNORMAL HIGH (ref 70–99)
Glucose-Capillary: 88 mg/dL (ref 70–99)

## 2024-03-07 LAB — PROCALCITONIN: Procalcitonin: 2.06 ng/mL

## 2024-03-07 LAB — PRO BRAIN NATRIURETIC PEPTIDE: Pro Brain Natriuretic Peptide: 2835 pg/mL — ABNORMAL HIGH (ref <300.0)

## 2024-03-07 LAB — LACTIC ACID, PLASMA: Lactic Acid, Venous: 2 mmol/L (ref 0.5–1.9)

## 2024-03-07 MED ORDER — LACTATED RINGERS IV BOLUS
1000.0000 mL | Freq: Once | INTRAVENOUS | Status: AC
  Administered 2024-03-07: 14:00:00 1000 mL via INTRAVENOUS

## 2024-03-07 MED ORDER — INSULIN ASPART 100 UNIT/ML IJ SOLN
0.0000 [IU] | Freq: Three times a day (TID) | INTRAMUSCULAR | Status: DC
  Administered 2024-03-08: 13:00:00 2 [IU] via SUBCUTANEOUS
  Administered 2024-03-08 – 2024-03-17 (×7): 1 [IU] via SUBCUTANEOUS
  Filled 2024-03-07 (×6): qty 1

## 2024-03-07 MED ORDER — ONDANSETRON HCL 4 MG PO TABS
4.0000 mg | ORAL_TABLET | Freq: Four times a day (QID) | ORAL | Status: DC | PRN
  Administered 2024-03-15: 4 mg via ORAL
  Filled 2024-03-07: qty 1

## 2024-03-07 MED ORDER — ONDANSETRON HCL 4 MG/2ML IJ SOLN
4.0000 mg | Freq: Four times a day (QID) | INTRAMUSCULAR | Status: DC | PRN
  Filled 2024-03-07: qty 2

## 2024-03-07 MED ORDER — ACETAMINOPHEN 325 MG PO TABS
650.0000 mg | ORAL_TABLET | Freq: Four times a day (QID) | ORAL | Status: DC | PRN
  Administered 2024-03-09: 650 mg via ORAL
  Filled 2024-03-07: qty 2

## 2024-03-07 MED ORDER — FENTANYL CITRATE (PF) 100 MCG/2ML IJ SOLN: 12.5000 ug | INTRAMUSCULAR | Status: DC | PRN

## 2024-03-07 MED ORDER — BISACODYL 5 MG PO TBEC: 5.0000 mg | DELAYED_RELEASE_TABLET | Freq: Every day | ORAL | Status: DC | PRN

## 2024-03-07 MED ORDER — OXYCODONE HCL 5 MG PO TABS
5.0000 mg | ORAL_TABLET | Freq: Four times a day (QID) | ORAL | Status: DC | PRN
  Administered 2024-03-09 – 2024-03-15 (×5): 5 mg via ORAL
  Filled 2024-03-07 (×5): qty 1

## 2024-03-07 MED ORDER — TRAZODONE HCL 50 MG PO TABS
25.0000 mg | ORAL_TABLET | Freq: Every evening | ORAL | Status: DC | PRN
  Administered 2024-03-12: 25 mg via ORAL
  Filled 2024-03-07: qty 1

## 2024-03-07 MED ORDER — FENTANYL CITRATE (PF) 50 MCG/ML IJ SOSY: 12.5000 ug | PREFILLED_SYRINGE | INTRAMUSCULAR | Status: DC | PRN

## 2024-03-07 MED ORDER — ENOXAPARIN SODIUM 30 MG/0.3ML IJ SOSY
30.0000 mg | PREFILLED_SYRINGE | INTRAMUSCULAR | Status: DC
  Administered 2024-03-07 – 2024-03-16 (×10): 30 mg via SUBCUTANEOUS
  Filled 2024-03-07 (×10): qty 0.3

## 2024-03-07 MED ORDER — ACETAMINOPHEN 650 MG RE SUPP: 650.0000 mg | Freq: Four times a day (QID) | RECTAL | Status: DC | PRN

## 2024-03-07 MED ORDER — VANCOMYCIN HCL IN DEXTROSE 1-5 GM/200ML-% IV SOLN: 1000.0000 mg | Freq: Once | INTRAVENOUS | Status: DC

## 2024-03-07 MED ORDER — LINEZOLID 600 MG PO TABS
600.0000 mg | ORAL_TABLET | Freq: Two times a day (BID) | ORAL | Status: DC
  Administered 2024-03-08 – 2024-03-17 (×19): 600 mg via ORAL
  Filled 2024-03-07 (×20): qty 1

## 2024-03-07 MED ORDER — LINEZOLID 600 MG/300ML IV SOLN
600.0000 mg | Freq: Once | INTRAVENOUS | Status: AC
  Administered 2024-03-07: 16:00:00 600 mg via INTRAVENOUS
  Filled 2024-03-07: qty 300

## 2024-03-07 MED ORDER — SODIUM CHLORIDE 0.9 % IV SOLN
2.0000 g | Freq: Once | INTRAVENOUS | Status: AC
  Administered 2024-03-07: 15:00:00 2 g via INTRAVENOUS
  Filled 2024-03-07: qty 12.5

## 2024-03-07 MED ORDER — STERILE WATER FOR INJECTION IV SOLN
INTRAVENOUS | Status: DC
  Filled 2024-03-07 (×2): qty 1000

## 2024-03-07 NOTE — Hospital Course (Addendum)
 78 year old male with complex past medical history including type 2 diabetes mellitus, hypertension, anemia of chronic disease, stage IIIb CKD, history of prostate cancer, gout, history of left acute pyelonephritis, left ureteral stone status post left ureteral stent placement by Dr. Sherrilee, MRSA bacteremia, Pseudomonas bacteremia, recently discharged with a PICC line and IV daptomycin  for presumed IE since patient refused TEE.  TTE was negative for vegetation.  Unfortunately patient reports that for the past several days he has been feeling unwell with progressive weakness.  He also reports having fever at home.  He pulled his right PICC line out and refused further IV antibiotics.  Home health nursing called infectious disease doctor who recommended that he take Zyvox  600 mg twice daily through end date of 03/20/2024 which was his original end date.  Patient and wife strongly felt that the treatments he was receiving was causing his progressive weakness and debility.  He has had poor oral intake over the past several days, poor appetite and reports that he is supposed to have his left ureteral stent removed by Dr. Sherrilee on Monday, 03/12/2024.  On arrival he was noted to be clinically dehydrated and ill-appearing.  His creatinine had risen to 6.87, up from 1.87 on 02/22/2024.  His hemoglobin is stable at 10.9.  His lactic acid was 2.0.  His CO2 was 16.  His AST was 59, ALT 13, GFR estimated at 8 mL/min.  His urinalysis was positive for hemoglobin, moderate leukocytes protein, rare bacteria 21-50 WBC per high-power field.  CT stone study suggesting consolidation in the lung bases, bilateral renal stones and bladder stones, left ureteral stone in place than previously seen left ureteral stone no longer present.  Patient was given IV fluids and admission requested for AKI management.  Nephrology was consulted to assist.

## 2024-03-07 NOTE — ED Provider Notes (Signed)
  EMERGENCY DEPARTMENT AT [facility] Provider Note   CSN: [id number] Arrival date & time: 03/07/24  1148     Patient presents with: Weakness   Jahmeir Geisen Amster is a 78 y.o. male.   HPI Pt arrived via POV from home c/o weakness that has been worsening over last several days. Pts significant other reports Pt is refusing to eat or drink and reports Pt recently has Sepsis and has been receiving daily antibiotics. Pt reports he pulled out his PICC line yesterday and is refusing IV Antibiotics.   He and wife both hypothesized the patient's treatment regimen was contributing to his anorexia, lack of appetite and worsening weakness. He denies focal pain, focal weakness, confusion. Chart review notable for recent urology manipulation of stone following stent placement earlier this month.     Prior to Admission medications  Medication Sig Start Date End Date Taking? Authorizing Provider  amLODipine  (NORVASC ) 5 MG tablet Take 1 tablet (5 mg total) by mouth daily. 02/23/24 03/24/24  Vernon Ranks, MD  aspirin  EC 81 MG tablet Take 81 mg by mouth daily.    [provider]  Cholecalciferol  (VITAMIN D -3 PO) Take 1 capsule by mouth daily.    [provider]  colchicine  0.6 MG tablet Take 1 tablet (0.6 mg total) by mouth 2 (two) times daily for 10 days. 02/13/24 02/23/24  Sebastian Toribio GAILS, MD  CRANBERRY PO Take 2 capsules by mouth daily.    [provider]  Cyanocobalamin  (VITAMIN B-12) 2500 MCG SUBL Place 2,500 mcg under the tongue daily.    [provider]  DAPTOmycin  (CUBICIN ) 500 MG injection Inject into the vein. 02/23/24   [provider]  daptomycin  (CUBICIN ) IVPB Inject 700 mg into the vein daily for 26 days. Indication:  MRSE bacteremia First Dose: Yes Last Day of Therapy:  03/20/24 Labs - Once weekly:  CBC/D, BMP, and CPK Labs - Once weekly: ESR and CRP Method of administration: IV Push Method of administration may be  changed at the discretion of home infusion pharmacist based upon assessment of the patient and/or caregiver's ability to self-administer the medication ordered. 02/23/24 03/20/24  Manandhar, Sabina, MD  glipiZIDE (GLUCOTROL XL) 2.5 MG 24 hr tablet Take 2.5 mg by mouth daily. 11/26/22   [provider]  Glucose Blood (BLOOD GLUCOSE TEST STRIPS) STRP 1 each by Does not apply route as directed. Dispense based on patient and insurance preference. Use up to four times daily as directed. (FOR ICD-10 E10.9, E11.9). 02/13/24   Sebastian Toribio GAILS, MD  heparin  100 units/mL SOLN by Dialysis route daily. 1 dose of an unknown amount    [provider]  ibuprofen (ADVIL) 200 MG tablet Take 400 mg by mouth every 6 (six) hours as needed for headache (pain).    [provider]  [Paused] indomethacin (INDOCIN) 25 MG capsule Take 25 mg by mouth 2 (two) times daily with a meal. Wait to take this until your doctor or other care provider tells you to start again. 05/20/21   [provider]  Lancets MISC 1 each by Does not apply route as directed. Dispense based on patient and insurance preference. Use up to four times daily as directed. (FOR ICD-10 E10.9, E11.9). 02/13/24   Sebastian Toribio GAILS, MD  linezolid  (ZYVOX ) 600 MG tablet Take 1 tablet (600 mg total) by mouth 2 (two) times daily for 14 days. 03/07/24 03/21/24  Waddell Alan PARAS, RPH-CPP  meclizine  (ANTIVERT ) 25 MG tablet Take 25 mg  by mouth 2 (two) times daily.    [provider]  Melatonin 10 MG CHEW Chew 20 mg by mouth at bedtime.    [provider]  metoprolol  succinate (TOPROL -XL) 25 MG 24 hr tablet Take 25 mg by mouth in the morning and at bedtime. 09/01/20   [provider]  AISHA FLING TEST test strip 1 each by Other route daily. 02/13/24   Sebastian Toribio GAILS, MD  OVER THE COUNTER MEDICATION Take 1 capsule by mouth daily. OTC Nervive; nerve relief.    [provider]  OVER THE COUNTER  MEDICATION Take 2 tablets by mouth daily. OTC Joint and Muscle support.    [provider]  oxyCODONE -acetaminophen  (PERCOCET/ROXICET) 5-325 MG tablet Take 1 tablet by mouth every 4 (four) hours as needed for severe pain (pain score 7-10). 03/01/24   McKenzie, Belvie CROME, MD  Phenazopyridine HCl (URISTAT ULTRA PO) Take 1 tablet by mouth 2 (two) times daily as needed (urinary pain).    [provider]  rosuvastatin  (CRESTOR ) 5 MG tablet Take 5 mg by mouth every Saturday.    [provider]  tamsulosin  (FLOMAX ) 0.4 MG CAPS capsule Take 1 capsule (0.4 mg total) by mouth daily. 02/03/24   McKenzie, Belvie CROME, MD  traMADol  (ULTRAM ) 50 MG tablet Take 50 mg by mouth 3 (three) times daily as needed for moderate pain (pain score 4-6). 01/23/24   [provider]  TURMERIC CURCUMIN PO Take 1 tablet by mouth daily. OTC Ultra-absorbable curcumin.    [provider]    Allergies: Dilaudid [hydromorphone hcl]    Review of Systems  Updated Vital Signs BP 138/73   Pulse 90   Temp 97.9 F (36.6 C)   Resp (!) 32   Ht 1.803 m (5' 11)   Wt 103.8 kg   SpO2 93%   BMI 31.92 kg/m   Physical Exam Vitals and nursing note reviewed.  Constitutional:      General: He is not in acute distress.    Appearance: He is well-developed.  HENT:     Head: Normocephalic and atraumatic.  Eyes:     Conjunctiva/sclera: Conjunctivae normal.  Cardiovascular:     Rate and Rhythm: Normal rate and regular rhythm.  Pulmonary:     Effort: Pulmonary effort is normal. No respiratory distress.     Breath sounds: No stridor.  Abdominal:     General: There is no distension.     Tenderness: There is no abdominal tenderness. There is no guarding.  Skin:    General: Skin is warm and dry.  Neurological:     Mental Status: He is alert and oriented to person, place, and time.     (all labs ordered are listed, but only abnormal results are displayed) Labs Reviewed  LACTIC ACID,  PLASMA - Abnormal; Notable for the following components:      Result Value   Lactic Acid, Venous 2.0 (*)    All other components within normal limits  CBC WITH DIFFERENTIAL/PLATELET - Abnormal; Notable for the following components:   RBC 3.80 (*)    Hemoglobin 10.9 (*)    HCT 34.0 (*)    Platelets 497 (*)    Eosinophils Absolute 0.6 (*)    Abs Immature Granulocytes 0.11 (*)    All other components within normal limits  PROTIME-INR - Abnormal; Notable for the following components:   Prothrombin Time 19.2 (*)    INR 1.5 (*)    All other components within normal limits  CULTURE, BLOOD (ROUTINE X 2)  CULTURE, BLOOD (ROUTINE X 2)  LACTIC ACID, PLASMA  COMPREHENSIVE METABOLIC PANEL WITH GFR  URINALYSIS, W/ REFLEX TO CULTURE (INFECTION SUSPECTED)    EKG: EKG Interpretation Date/Time:  Wednesday March 07 2024 12:09:04 EST Ventricular Rate:  94 PR Interval:  135 QRS Duration:  69 QT Interval:  352 QTC Calculation: 441 R Axis:   6  Text Interpretation: Sinus rhythm Atrial premature complex Abnormal R-wave progression, early transition LVH with secondary repolarization abnormality Confirmed by Garrick Charleston [phone]) on 03/07/2024 1:17:52 PM  Radiology: No results found.   Procedures   Medications Ordered in the ED - No data to display                                  Medical Decision Making Elderly male with recent sepsis, now presents after removing his own PICC line while in the middle of the course of antibiotics for sepsis.  Patient complains of weakness, but is awake, alert, providing some history, is not hypotensive, tachycardic or febrile.  He has mild tachypnea.  Differential includes progression of infection, fluid overload status, dehydration, electrolyte abnormalities.  Cardiac 90 sinus normal pulse ox 100% room air normal  Amount and/or Complexity of Data Reviewed Independent Historian: spouse External Data Reviewed: notes. Labs: ordered. Decision-making  details documented in ED Course. Radiology: ordered and independent interpretation performed. Decision-making details documented in ED Course. ECG/medicine tests: ordered and independent interpretation performed. Decision-making details documented in ED Course.  Risk Prescription drug management. Decision regarding hospitalization.   2:10 PM Patient with evidence for pneumonia on x-ray, evidence for acute kidney injury via labs.  Patient has baseline CKD, now progressed to nearly 4 times his most recent value creatinine. Patient is receiving fluids, will receive broad-spectrum antibiotics, given concern for AKI will require admission. CT abdomen pelvis to exclude obstructive process, particular given the patient's recent stent, urologic procedures, pending.  Update: Patient aware of all findings including concern for pneumonia.  Patient with evidence for pneumonia, acute kidney injury, though no hypotension, and only trace lactic acid level. Patient's initial fluid resuscitation transitioned to judicious fluids given his elevated BNP, this will require additional close monitoring as an inpatient.   Final diagnoses:  HCAP (healthcare-associated pneumonia)  AKI (acute kidney injury)    CRITICAL CARE Performed by: [place] Garrick Total critical care time: 40 minutes Critical care time was exclusive of separately billable procedures and treating other patients. Critical care was necessary to treat or prevent imminent or life-threatening deterioration. Critical care was time spent personally by me on the following activities: development of treatment plan with patient and/or surrogate as well as nursing, discussions with consultants, evaluation of patient's response to treatment, examination of patient, obtaining history from patient or surrogate, ordering and performing treatments and interventions, ordering and review of laboratory studies, ordering and review of radiographic studies, pulse  oximetry and re-evaluation of patient's condition.    Garrick Charleston, MD 03/07/24 1524

## 2024-03-07 NOTE — H&P (Signed)
 History and Physical  [facility]  Marcus Simpson FMW:[id number] DOB: June 04, 1945 DOA: 03/07/2024  PCP: [name], MD  Patient coming from: Home  Level of care: Med-Surg  I have personally briefly reviewed patient's old medical records in [facility]  Chief Complaint: weakness   HPI: Marcus Simpson is a 78 year old male with complex past medical history including type 2 diabetes mellitus, hypertension, anemia of chronic disease, stage IIIb CKD, history of prostate cancer, gout, history of left acute pyelonephritis, left ureteral stone status post left ureteral stent placement by Dr. Sherrilee, MRSA bacteremia, Pseudomonas bacteremia, recently discharged with a PICC line and IV daptomycin  for presumed IE since patient refused TEE.  TTE was negative for vegetation.  Unfortunately patient reports that for the past several days he has been feeling unwell with progressive weakness.  He also reports having fever at home.  He pulled his right PICC line out and refused further IV antibiotics.  Home health nursing called infectious disease doctor who recommended that he take Zyvox  600 mg twice daily through end date of 03/20/2024 which was his original end date.  Patient and wife strongly felt that the treatments he was receiving was causing his progressive weakness and debility.  He has had poor oral intake over the past several days, poor appetite and reports that he is supposed to have his left ureteral stent removed by Dr. Sherrilee on Monday, 03/12/2024.  On arrival he was noted to be clinically dehydrated and ill-appearing.  His creatinine had risen to 6.87, up from 1.87 on 02/22/2024.  His hemoglobin is stable at 10.9.  His lactic acid was 2.0.  His CO2 was 16.  His AST was 59, ALT 13, GFR estimated at 8 mL/min.  His urinalysis was positive for hemoglobin, moderate leukocytes protein, rare bacteria 21-50 WBC per high-power field.  CT stone study suggesting consolidation in the lung bases,  bilateral renal stones and bladder stones, left ureteral stone in place than previously seen left ureteral stone no longer present.  Patient was given IV fluids and admission requested for AKI management.   Past Medical History:  Diagnosis Date   Arthritis    Bradycardia    asymptomatic    Diabetes mellitus without complication (HCC)    History of kidney stones    Hypertension    Prostate cancer [facility])     Past Surgical History:  Procedure Laterality Date   COLONOSCOPY     CYSTOSCOPY WITH STENT PLACEMENT Right 12/28/2022   Procedure: CYSTOSCOPY WITH STENT PLACEMENT;  Surgeon: Carolee Sherwood [name], MD;  Location: [facility] OR;  Service: Urology;  Laterality: Right;   CYSTOSCOPY WITH STENT PLACEMENT Left 02/05/2024   Procedure: CYSTOSCOPY, WITH STENT INSERTION. URETHRAL DILATION;  Surgeon: Shane Steffan BROCKS, MD;  Location: [facility] ORS;  Service: Urology;  Laterality: Left;   CYSTOSCOPY/RETROGRADE/URETEROSCOPY/STONE EXTRACTION WITH BASKET Left 03/01/2024   Procedure: CYSTOSCOPY, WITH CALCULUS REMOVAL USING BASKET;  Surgeon: Sherrilee Belvie CROME, MD;  Location: [facility] ORS;  Service: Urology;  Laterality: Left;   CYSTOSCOPY/URETEROSCOPY/HOLMIUM LASER/STENT PLACEMENT Left 03/01/2024   Procedure: CYSTOSCOPY/URETEROSCOPY/HOLMIUM LASER/STENT PLACEMENT;  Surgeon: Sherrilee Belvie CROME, MD;  Location: [facility] ORS;  Service: Urology;  Laterality: Left;   EXTRACORPOREAL SHOCK WAVE LITHOTRIPSY Right 01/25/2023   Procedure: EXTRACORPOREAL SHOCK WAVE LITHOTRIPSY (ESWL);  Surgeon: [name], MD;  Location: [facility] ORS;  Service: Urology;  Laterality: Right;   FLEXIBLE SIGMOIDOSCOPY N/A 04/02/2015   Procedure: FLEXIBLE SIGMOIDOSCOPY;  Surgeon: Claudis RAYMOND Rivet, MD;  Location: [facility]ITE;  Service: Endoscopy;  Laterality: N/A;  1:45 - moved to 1/11 @ 8:30 - Ann to notify   HOLMIUM LASER APPLICATION Left 03/01/2024   Procedure: HOLMIUM LASER APPLICATION;  Surgeon: Sherrilee Belvie CROME, MD;  Location: [facility]S;  Service: Urology;   Laterality: Left;   KIDNEY STONE SURGERY     RADIOACTIVE SEED IMPLANT N/A 12/29/2023   Procedure: INSERTION, RADIATION SOURCE, PROSTATE;  Surgeon: Sherrilee Belvie CROME, MD;  Location: [facility] ORS;  Service: Urology;  Laterality: N/A;   SPACE OAR INSTILLATION N/A 12/29/2023   Procedure: INJECTION, HYDROGEL SPACER;  Surgeon: Sherrilee Belvie CROME, MD;  Location: [facility] ORS;  Service: Urology;  Laterality: N/A;   TOTAL KNEE ARTHROPLASTY Left 05/02/2017   Procedure: LEFT TOTAL KNEE ARTHROPLASTY;  Surgeon: Melodi Lerner, MD;  Location: [facility] ORS;  Service: Orthopedics;  Laterality: Left;     reports that he has quit smoking. His smoking use included cigarettes. He has never used smokeless tobacco. He reports that he does not drink alcohol and does not use drugs.  Allergies[1]  History reviewed. No pertinent family history.  Prior to Admission medications  Medication Sig Start Date End Date Taking? Authorizing Provider  amLODipine  (NORVASC ) 5 MG tablet Take 1 tablet (5 mg total) by mouth daily. 02/23/24 03/24/24  Vernon Ranks, MD  aspirin  EC 81 MG tablet Take 81 mg by mouth daily.    [provider]  Cholecalciferol  (VITAMIN D -3 PO) Take 1 capsule by mouth daily.    [provider]  colchicine  0.6 MG tablet Take 1 tablet (0.6 mg total) by mouth 2 (two) times daily for 10 days. 02/13/24 02/23/24  Sebastian Toribio GAILS, MD  CRANBERRY PO Take 2 capsules by mouth daily.    [provider]  Cyanocobalamin  (VITAMIN B-12) 2500 MCG SUBL Place 2,500 mcg under the tongue daily.    [provider]  DAPTOmycin  (CUBICIN ) 500 MG injection Inject into the vein. 02/23/24   [provider]  daptomycin  (CUBICIN ) IVPB Inject 700 mg into the vein daily for 26 days. Indication:  MRSE bacteremia First Dose: Yes Last Day of Therapy:  03/20/24 Labs - Once weekly:  CBC/D, BMP, and CPK Labs - Once weekly: ESR and CRP Method of administration: IV Push Method of administration may be changed at the  discretion of home infusion pharmacist based upon assessment of the patient and/or caregiver's ability to self-administer the medication ordered. 02/23/24 03/20/24  Manandhar, Sabina, MD  glipiZIDE (GLUCOTROL XL) 2.5 MG 24 hr tablet Take 2.5 mg by mouth daily. 11/26/22   [provider]  Glucose Blood (BLOOD GLUCOSE TEST STRIPS) STRP 1 each by Does not apply route as directed. Dispense based on patient and insurance preference. Use up to four times daily as directed. (FOR ICD-10 E10.9, E11.9). 02/13/24   Sebastian Toribio GAILS, MD  heparin  100 units/mL SOLN by Dialysis route daily. 1 dose of an unknown amount    [provider]  ibuprofen (ADVIL) 200 MG tablet Take 400 mg by mouth every 6 (six) hours as needed for headache (pain).    [provider]  [Paused] indomethacin (INDOCIN) 25 MG capsule Take 25 mg by mouth 2 (two) times daily with a meal. Wait to take this until your doctor or other care provider tells you to start again. 05/20/21   [provider]  Lancets MISC 1 each by Does not apply route as directed. Dispense based on patient and insurance preference. Use up to four times daily as directed. (FOR ICD-10 E10.9, E11.9). 02/13/24  Sebastian Toribio GAILS, MD  linezolid  (ZYVOX ) 600 MG tablet Take 1 tablet (600 mg total) by mouth 2 (two) times daily for 14 days. 03/07/24 03/21/24  Waddell Alan PARAS, RPH-CPP  meclizine  (ANTIVERT ) 25 MG tablet Take 25 mg by mouth 2 (two) times daily.    [provider]  Melatonin 10 MG CHEW Chew 20 mg by mouth at bedtime.    [provider]  metoprolol  succinate (TOPROL -XL) 25 MG 24 hr tablet Take 25 mg by mouth in the morning and at bedtime. 09/01/20   [provider]  AISHA FLING TEST test strip 1 each by Other route daily. 02/13/24   Sebastian Toribio GAILS, MD  OVER THE COUNTER MEDICATION Take 1 capsule by mouth daily. OTC Nervive; nerve relief.    [provider]  OVER THE COUNTER MEDICATION Take 2  tablets by mouth daily. OTC Joint and Muscle support.    [provider]  oxyCODONE -acetaminophen  (PERCOCET/ROXICET) 5-325 MG tablet Take 1 tablet by mouth every 4 (four) hours as needed for severe pain (pain score 7-10). 03/01/24   McKenzie, Belvie CROME, MD  Phenazopyridine HCl (URISTAT ULTRA PO) Take 1 tablet by mouth 2 (two) times daily as needed (urinary pain).    [provider]  rosuvastatin  (CRESTOR ) 5 MG tablet Take 5 mg by mouth every Saturday.    [provider]  tamsulosin  (FLOMAX ) 0.4 MG CAPS capsule Take 1 capsule (0.4 mg total) by mouth daily. 02/03/24   McKenzie, Belvie CROME, MD  traMADol  (ULTRAM ) 50 MG tablet Take 50 mg by mouth 3 (three) times daily as needed for moderate pain (pain score 4-6). 01/23/24   [provider]  TURMERIC CURCUMIN PO Take 1 tablet by mouth daily. OTC Ultra-absorbable curcumin.    [provider]    Physical Exam: Vitals:   03/07/24 1215 03/07/24 1230 03/07/24 1245 03/07/24 1345  BP: 138/73 (!) 145/85 (!) 141/70   Pulse: 90 90 94 86  Resp: (!) 32 (!) 32 (!) 22 (!) 23  Temp:      SpO2: 93% 92% 94% 92%  Weight:      Height:        Constitutional: NAD, calm, comfortable Eyes: PERRL, lids and conjunctivae normal ENMT: Mucous membranes are dry. Posterior pharynx clear of any exudate or lesions.Normal dentition.  Neck: normal, supple, no masses, no thyromegaly Respiratory: clear to auscultation bilaterally, no wheezing, no crackles. Normal respiratory effort. No accessory muscle use.  Cardiovascular: normal s1, s2 sounds, no murmurs / rubs / gallops. No extremity edema. 2+ pedal pulses. No carotid bruits.  Abdomen: no tenderness, no masses palpated. No hepatosplenomegaly. Bowel sounds positive.  Musculoskeletal: no clubbing / cyanosis. No joint deformity upper and lower extremities. Good ROM, no contractures. Normal muscle tone.  Skin: no rashes, lesions, ulcers. No induration Neurologic: CN 2-12 grossly  intact. Sensation intact, DTR normal. Strength 5/5 in all 4.  Psychiatric: Normal judgment and insight. Alert and oriented x 3. Normal mood.   Labs on Admission: I have personally reviewed following labs and imaging studies  CBC: Recent Labs  Lab 03/07/24 1216  WBC 9.1  NEUTROABS 6.4  HGB 10.9*  HCT 34.0*  MCV 89.5  PLT 497*   Basic Metabolic Panel: Recent Labs  Lab 03/07/24 1216  NA 137  K 4.2  CL 100  CO2 16*  GLUCOSE 97  BUN 84*  CREATININE 6.87*  CALCIUM  10.2   GFR: Estimated Creatinine Clearance: 10.5 mL/min (A) (by C-G formula based on SCr of  6.87 mg/dL (H)). Liver Function Tests: Recent Labs  Lab 03/07/24 1216  AST 59*  ALT 13  ALKPHOS 135*  BILITOT 0.9  PROT 8.3*  ALBUMIN 3.2*   No results for input(s): LIPASE, AMYLASE in the last 168 hours. No results for input(s): AMMONIA in the last 168 hours. Coagulation Profile: Recent Labs  Lab 03/07/24 1216  INR 1.5*   Cardiac Enzymes: No results for input(s): CKTOTAL, CKMB, CKMBINDEX, TROPONINI in the last 168 hours. BNP (last 3 results) Recent Labs    03/07/24 1216  PROBNP 2,835.0*   HbA1C: No results for input(s): HGBA1C in the last 72 hours. CBG: Recent Labs  Lab 03/01/24 0940  GLUCAP 164*   Lipid Profile: No results for input(s): CHOL, HDL, LDLCALC, TRIG, CHOLHDL, LDLDIRECT in the last 72 hours. Thyroid  Function Tests: No results for input(s): TSH, T4TOTAL, FREET4, T3FREE, THYROIDAB in the last 72 hours. Anemia Panel: No results for input(s): VITAMINB12, FOLATE, FERRITIN, TIBC, IRON, RETICCTPCT in the last 72 hours. Urine analysis:    Component Value Date/Time   COLORURINE YELLOW 03/07/2024 1246   APPEARANCEUR CLEAR 03/07/2024 1246   APPEARANCEUR Clear 05/17/2023 1312   LABSPEC 1.012 03/07/2024 1246   PHURINE 6.0 03/07/2024 1246   GLUCOSEU NEGATIVE 03/07/2024 1246   HGBUR LARGE (A) 03/07/2024 1246   BILIRUBINUR NEGATIVE 03/07/2024  1246   BILIRUBINUR Negative 05/17/2023 1312   KETONESUR NEGATIVE 03/07/2024 1246   PROTEINUR 100 (A) 03/07/2024 1246   NITRITE NEGATIVE 03/07/2024 1246   LEUKOCYTESUR MODERATE (A) 03/07/2024 1246    Radiological Exams on Admission: CT Renal Stone Study Result Date: 03/07/2024 CLINICAL DATA:  Abdominal/flank pain.  Weakness anorexia. EXAM: CT ABDOMEN AND PELVIS WITHOUT CONTRAST TECHNIQUE: Multidetector CT imaging of the abdomen and pelvis was performed following the standard protocol without IV contrast. RADIATION DOSE REDUCTION: This exam was performed according to the departmental dose-optimization program which includes automated exposure control, adjustment of the mA and/or kV according to patient size and/or use of iterative reconstruction technique. COMPARISON:  02/18/2024. FINDINGS: Lower chest: Severe patchy bilateral airspace consolidation in the lung bases, incompletely visualized. Heart is mildly enlarged. No pericardial or pleural effusion. Distal esophagus is grossly unremarkable. Hepatobiliary: Liver and gallbladder are unremarkable. No biliary ductal dilatation. Pancreas: Negative. Spleen: Negative. Adrenals/Urinary Tract: Adrenal glands are unremarkable. Bilateral renal stones. Low-attenuation lesions in the kidneys. No specific follow-up necessary. Right ureter is decompressed. Double-J left ureteral stent in place with the proximal loop in the left renal pelvis and distal loop in the bladder. Minimal left ureteral prominence, similar to 02/18/2024. Previously seen mid left ureteral stone is no longer visualized. Tiny dependent stones in the bladder. Locules of non dependent air are presumably iatrogenic in etiology. Stomach/Bowel: Stomach, small bowel, appendix and colon are unremarkable. Vascular/Lymphatic: Atherosclerotic calcification of the aorta. No pathologically enlarged lymph nodes. Reproductive: Brachytherapy seeds in the prostate. Other: No free fluid.  Mesenteries and  peritoneum are unremarkable. Musculoskeletal: Degenerative changes in the spine. Osteopenia. Minimal grade 1 anterolisthesis of L4 on L5 and L5 on S1. Old T12 and L1 compression fractures. IMPRESSION: 1. Multifocal consolidation in the lung bases, indicative of pneumonia. Organizing pneumonia is not excluded. Please correlate clinically. Findings are only partially imaged. Consider CT chest without contrast in further evaluation, as clinically indicated. 2. Bilateral renal stones and tiny bladder stones. Double-J left ureteral stent in place with minimal prominence of the left ureter, unchanged. Previously seen mid left ureteral stone is no longer visualized. 3.  Aortic atherosclerosis (ICD10-I70.0). Electronically Signed  By: Newell Eke M.D.   On: 03/07/2024 14:37   DG Chest Port 1 View Result Date: 03/07/2024 CLINICAL DATA:  Worsening weakness over the past several days. Possible sepsis. EXAM: PORTABLE CHEST 1 VIEW COMPARISON:  02/18/2024 FINDINGS: Lungs are hypoinflated with patchy bilateral airspace opacification likely multifocal pneumonia as this is slightly worse. Possible small amount left pleural fluid unchanged. Cardiomediastinal silhouette and remainder of the exam is unchanged. IMPRESSION: Slight worsening patchy bilateral airspace process likely multifocal pneumonia. Possible small amount left pleural fluid unchanged. Electronically Signed   By: Toribio Agreste M.D.   On: 03/07/2024 13:19   Assessment/Plan Principal Problem:   AKI (acute kidney injury) Active Problems:   Generalized weakness   Bilateral nephrolithiasis   Hyperlipidemia   Essential hypertension   Prostate cancer (HCC)   OA (osteoarthritis) of knee   Ureteral stone with hydronephrosis   Bacteremia due to methicillin resistant Staphylococcus epidermidis   Left ureteral calculus   Anemia of chronic disease   Well controlled type 2 diabetes mellitus (HCC)   Bacteremia due to Pseudomonas   Microcytic anemia    Pneumonia   Metabolic acidosis   AKI on CKD stage 3b -- hopefully from poor oral intake and this is prerenal -- hopefully his renal function will improve with IV fluid hydration -- sodium bicarbonate  infusion ordered for now given metabolic acidosis -- follow urine output as he has only produced a small amount of urine in the ED -- hopefully he does not become anuric -- follow daily renal function labs  Generalized Weakness -- suspect this is all precipitated from the AKI  -- treating supportively for now  Bilateral nephrolithiasis -- no signs of obstruction at this time -- pt is due to have ureteral stent removed by Dr. Sherrilee on 03/12/24 -- will notify Dr. Sherrilee of admission   Essential hypertension  -- stable, follow -- resume home meds as able   MRSE bacteremia -- pt had a PICC line and was on IV daptomycin  until 12/17 when he removed PICC line and refused further IV antibiotics -- the home heath RN  had contacted ID physician and the recommendation was to start zyvox  600 mg BID to complete course of treatment thru 03/20/24  Anemia of chronic disease -- Hg stable from recent testing around 10 -- Hg may trend down from hemodilution with IV fluids ordered  Metabolic Acidosis -- secondary to worsening renal failure  -- sodium bicarbonate  IV fluid ordered -- recheck in AM   Bacteremia due to pseudomonas -- pt reports he completed the course of antibiotics for this   Type 2 DM, controlled with renal complications -- SSI coverage with frequent CBG monitoring ordered  DVT prophylaxis: enoxaparin    Code Status: Full   Family Communication: wife at bedside 12/17  Disposition Plan: TBD   Consults called:   Admission status: INP Time spent: 70 mins  Level of care: Med-Surg Afton Louder MD [facility] How to contact the [facility] Attending or Consulting provider 7A - 7P or covering provider during after hours 7P -7A, for this patient?  Check the care team in  [facility] and look for a) attending/consulting [facility] provider listed and b) the [facility] team listed Log into [email or web address] and use [facility]'s universal password to access. If you do not have the password, please contact the hospital operator. Locate the [facility] provider you are looking for under [facility] and page to a number that you can be directly reached. If you still have difficulty reaching  the provider, please page the [facility] (Director on Call) for the Hospitalists listed on amion for assistance.   If 7PM-7AM, please contact night-coverage [email or web address] Password [email or web address]  03/07/2024, 3:29 PM        [1]  Allergies Allergen Reactions   Dilaudid [Hydromorphone Hcl] Anaphylaxis and Other (See Comments)    Cardiac arrest

## 2024-03-07 NOTE — Telephone Encounter (Signed)
 Received call from Valery [phone]) [facility] nurse yesterday stating patient fully pulled out PICC line and refused replacement or further antibiotics. Spoke with Dr. Dea who recommend linezolid  600 mg BID through remainder of treatment (03/20/24).   Attempted to call patient today to discuss plan. Unfortunately, no answer and unable to LVM as VM box is full. Patient does not have MyChart. Attempted to reach [facility] again and left message relaying plan. Will send linezolid  to [facility] as seems to be main pharmacy patient utilizes.  Triage - can you please assist in reaching patient to relay plan? There are no special instructions for taking linezolid  aside from preferably taking with food to prevent stomach upset.   Dr. Dea - he will follow up with you on 03/13/24. Hopefully we are able to reach him to relay plan.  Alan Geralds, PharmD, CPP, BCIDP, AAHIVP Clinical Pharmacist Practitioner Infectious Diseases Clinical Pharmacist [facility]

## 2024-03-07 NOTE — ED Triage Notes (Signed)
 Pt arrived via POV from home c/o weakness that has been worsening over last several days. Pts significant other reports Pt is refusing to eat or drink and reports Pt recently has Sepsis and has been receiving daily antibiotics. Pt reports he pulled out his PICC line yesterday and is refusing IV Antibiotics.

## 2024-03-08 ENCOUNTER — Inpatient Hospital Stay (HOSPITAL_COMMUNITY)

## 2024-03-08 DIAGNOSIS — E872 Acidosis, unspecified: Secondary | ICD-10-CM | POA: Diagnosis not present

## 2024-03-08 DIAGNOSIS — E119 Type 2 diabetes mellitus without complications: Secondary | ICD-10-CM | POA: Diagnosis not present

## 2024-03-08 DIAGNOSIS — E785 Hyperlipidemia, unspecified: Secondary | ICD-10-CM | POA: Diagnosis not present

## 2024-03-08 DIAGNOSIS — B957 Other staphylococcus as the cause of diseases classified elsewhere: Secondary | ICD-10-CM | POA: Diagnosis not present

## 2024-03-08 DIAGNOSIS — Z1629 Resistance to other single specified antibiotic: Secondary | ICD-10-CM | POA: Diagnosis not present

## 2024-03-08 DIAGNOSIS — N200 Calculus of kidney: Secondary | ICD-10-CM | POA: Diagnosis not present

## 2024-03-08 DIAGNOSIS — R7881 Bacteremia: Secondary | ICD-10-CM | POA: Diagnosis not present

## 2024-03-08 DIAGNOSIS — N179 Acute kidney failure, unspecified: Secondary | ICD-10-CM | POA: Diagnosis not present

## 2024-03-08 DIAGNOSIS — R531 Weakness: Secondary | ICD-10-CM | POA: Diagnosis not present

## 2024-03-08 LAB — GLUCOSE, CAPILLARY
Glucose-Capillary: 104 mg/dL — ABNORMAL HIGH (ref 70–99)
Glucose-Capillary: 100 mg/dL — ABNORMAL HIGH (ref 70–99)
Glucose-Capillary: 141 mg/dL — ABNORMAL HIGH (ref 70–99)
Glucose-Capillary: 121 mg/dL — ABNORMAL HIGH (ref 70–99)
Glucose-Capillary: 117 mg/dL — ABNORMAL HIGH (ref 70–99)

## 2024-03-08 LAB — URINE CULTURE: Culture: NO GROWTH

## 2024-03-08 LAB — HEPATIC FUNCTION PANEL
ALT: 12 U/L (ref 0–44)
AST: 49 U/L — ABNORMAL HIGH (ref 15–41)
Albumin: 3 g/dL — ABNORMAL LOW (ref 3.5–5.0)
Alkaline Phosphatase: 119 U/L (ref 38–126)
Bilirubin, Direct: 0.8 mg/dL — ABNORMAL HIGH (ref 0.0–0.2)
Indirect Bilirubin: 0.4 mg/dL (ref 0.3–0.9)
Total Bilirubin: 1.2 mg/dL (ref 0.0–1.2)
Total Protein: 7.4 g/dL (ref 6.5–8.1)

## 2024-03-08 LAB — URINALYSIS, ROUTINE W REFLEX MICROSCOPIC
Bilirubin Urine: NEGATIVE
Glucose, UA: NEGATIVE mg/dL
Ketones, ur: NEGATIVE mg/dL
Nitrite: NEGATIVE
Protein, ur: 30 mg/dL — AB
Specific Gravity, Urine: 1.011 (ref 1.005–1.030)
pH: 6 (ref 5.0–8.0)

## 2024-03-08 LAB — CBC
HCT: 31.4 % — ABNORMAL LOW (ref 39.0–52.0)
Hemoglobin: 10 g/dL — ABNORMAL LOW (ref 13.0–17.0)
MCH: 28.4 pg (ref 26.0–34.0)
MCHC: 31.8 g/dL (ref 30.0–36.0)
MCV: 89.2 fL (ref 80.0–100.0)
Platelets: 448 K/uL — ABNORMAL HIGH (ref 150–400)
RBC: 3.52 MIL/uL — ABNORMAL LOW (ref 4.22–5.81)
RDW: 15.2 % (ref 11.5–15.5)
WBC: 7.3 K/uL (ref 4.0–10.5)
nRBC: 0 % (ref 0.0–0.2)

## 2024-03-08 LAB — BASIC METABOLIC PANEL WITH GFR
Anion gap: 15 (ref 5–15)
BUN: 82 mg/dL — ABNORMAL HIGH (ref 8–23)
CO2: 25 mmol/L (ref 22–32)
Calcium: 9.8 mg/dL (ref 8.9–10.3)
Chloride: 97 mmol/L — ABNORMAL LOW (ref 98–111)
Creatinine, Ser: 6.24 mg/dL — ABNORMAL HIGH (ref 0.61–1.24)
GFR, Estimated: 8 mL/min — ABNORMAL LOW (ref >60)
Glucose, Bld: 99 mg/dL (ref 70–99)
Potassium: 4.7 mmol/L (ref 3.5–5.1)
Sodium: 137 mmol/L (ref 135–145)

## 2024-03-08 LAB — SODIUM, URINE, RANDOM: Sodium, Ur: 58 mmol/L

## 2024-03-08 LAB — PROCALCITONIN: Procalcitonin: 1.43 ng/mL

## 2024-03-08 LAB — PHOSPHORUS: Phosphorus: 5.1 mg/dL — ABNORMAL HIGH (ref 2.5–4.6)

## 2024-03-08 LAB — MAGNESIUM: Magnesium: 2.1 mg/dL (ref 1.7–2.4)

## 2024-03-08 MED ORDER — NYSTATIN 100000 UNIT/ML MT SUSP
5.0000 mL | Freq: Four times a day (QID) | OROMUCOSAL | Status: DC
  Administered 2024-03-08 – 2024-03-16 (×35): 500000 [IU] via ORAL
  Filled 2024-03-08 (×37): qty 5

## 2024-03-08 MED ORDER — CHLORHEXIDINE GLUCONATE CLOTH 2 % EX PADS
6.0000 | MEDICATED_PAD | Freq: Every day | CUTANEOUS | Status: DC
  Administered 2024-03-09 – 2024-03-15 (×7): 6 via TOPICAL

## 2024-03-08 MED ORDER — LACTATED RINGERS IV SOLN: INTRAVENOUS | Status: AC

## 2024-03-08 MED ORDER — FLUCONAZOLE 150 MG PO TABS
150.0000 mg | ORAL_TABLET | Freq: Once | ORAL | Status: AC
  Administered 2024-03-08: 10:00:00 150 mg via ORAL
  Filled 2024-03-08: qty 1

## 2024-03-08 NOTE — Consult Note (Signed)
 Reason for Consult: AKI/CKD Stage IIIb Referring Physician: Vicci, MD  Marcus Simpson is an 78 y.o. male with a PMH significant for DM, HTN, h/o prostate cancer, gout, nephrolithiasis, multiple hospitalizations over the past 2 months.  Admitted 11/16-11/24/25 with AKI due ot obstructing left ureteral stone complicated by pyelonephritis and Pseudomonas sepsis with AKI receiving home IV antibiotics, recent admission for left ureteral stone s/p stent placement on 03/01/24 who presented to [facility] ED on 03/07/24 c/o weakness and anorexia that has been worsening over the past several days.  He also pulled out his PICC line prior to admission and is refusing IV antibiotics.  In the ED, Temp 97.9, Bp 141/70, HR 86, RR 23, SpO2 92% on 3 L [place].  Labs notable for WBC 9.1, Hgb 10.9, BUN 84, Cr 6.87, Co2 16, alb 3.2, Ca 10.2, pro-BNP 2,835.  CXR with worsening patchy bilateral airspace process likely multifocal pneumonia.  CT renal stone protocol with bilateral renal stones and tiny bladder stones, JJ stent in left ureter with minimal prominence of the left ureter (unchanged), and confirmed multifocal consolidation of the lung bases indicative of pneumonia.  He was admitted and we were consulted to further evaluate and manage his AKI on CKD Stage IIIb.  The trend in Scr is seen below.   He was prescribed IV daptomycin  through 03/20/24 since he refused TEE to r/o SBE.  ID has been following as well as Urology.   He denies any NSAID use.  No diarrhea just nausea and anorexia.   Trend in Creatinine: Creatinine, Ser  Date/Time Value Ref Range Status  03/08/2024 04:44 AM 6.24 (H) 0.61 - 1.24 mg/dL Final  87/82/7974 87:83 PM 6.87 (H) 0.61 - 1.24 mg/dL Final  87/95/7974 96:57 AM 1.87 (H) 0.61 - 1.24 mg/dL Final  87/96/7974 94:91 AM 2.21 (H) 0.61 - 1.24 mg/dL Final  87/97/7974 96:68 AM 2.59 (H) 0.61 - 1.24 mg/dL Final  87/98/7974 97:85 AM 2.85 (H) 0.61 - 1.24 mg/dL Final  88/69/7974 96:49 AM 2.77 (H) 0.61 - 1.24 mg/dL  Final  88/70/7974 88:94 PM 2.83 (H) 0.61 - 1.24 mg/dL Final  88/70/7974 93:74 PM 2.38 (H) 0.61 - 1.24 mg/dL Final  88/75/7974 97:55 AM 1.80 (H) 0.61 - 1.24 mg/dL Final  88/76/7974 96:87 AM 1.69 (H) 0.61 - 1.24 mg/dL Final  88/77/7974 94:57 AM 1.68 (H) 0.61 - 1.24 mg/dL Final  88/78/7974 94:75 AM 1.72 (H) 0.61 - 1.24 mg/dL Final  88/79/7974 94:41 AM 1.88 (H) 0.61 - 1.24 mg/dL Final  88/80/7974 94:62 AM 2.11 (H) 0.61 - 1.24 mg/dL Final  88/81/7974 94:54 AM 2.50 (H) 0.61 - 1.24 mg/dL Final  88/82/7974 87:54 AM 3.19 (H) 0.61 - 1.24 mg/dL Final  88/83/7974 97:56 PM 3.61 (H) 0.61 - 1.24 mg/dL Final  89/96/7974 98:69 PM 1.46 (H) 0.61 - 1.24 mg/dL Final  91/89/7974 92:69 AM 1.53 (H) 0.61 - 1.24 mg/dL Final  96/94/7974 90:63 AM 1.60 (H) 0.61 - 1.24 mg/dL Final  89/90/7975 94:75 AM 3.05 (H) 0.61 - 1.24 mg/dL Final  89/91/7975 87:50 PM 2.62 (H) 0.61 - 1.24 mg/dL Final  95/90/7975 98:45 PM 1.26 0.76 - 1.27 mg/dL Final  92/94/7977 87:46 PM 1.22 0.76 - 1.27 mg/dL Final  97/86/7980 94:74 AM 0.91 0.61 - 1.24 mg/dL Final  97/87/7980 94:63 AM 0.97 0.61 - 1.24 mg/dL Final  97/93/7980 90:62 AM 0.93 0.61 - 1.24 mg/dL Final  90/74/7981 88:74 AM 0.90 0.61 - 1.24 mg/dL Final  93/92/7991 96:87 AM 1.08  Final    PMH:  Past Medical History:  Diagnosis Date   Arthritis    Bradycardia    asymptomatic    Diabetes mellitus without complication (HCC)    History of kidney stones    Hypertension    Prostate cancer (HCC)     PSH:   Past Surgical History:  Procedure Laterality Date   COLONOSCOPY     CYSTOSCOPY WITH STENT PLACEMENT Right 12/28/2022   Procedure: CYSTOSCOPY WITH STENT PLACEMENT;  Surgeon: Carolee Sherwood [name], MD;  Location: [facility] OR;  Service: Urology;  Laterality: Right;   CYSTOSCOPY WITH STENT PLACEMENT Left 02/05/2024   Procedure: CYSTOSCOPY, WITH STENT INSERTION. URETHRAL DILATION;  Surgeon: Shane Steffan BROCKS, MD;  Location: [facility] ORS;  Service: Urology;  Laterality: Left;    CYSTOSCOPY/RETROGRADE/URETEROSCOPY/STONE EXTRACTION WITH BASKET Left 03/01/2024   Procedure: CYSTOSCOPY, WITH CALCULUS REMOVAL USING BASKET;  Surgeon: Sherrilee Belvie CROME, MD;  Location: [facility];  Service: Urology;  Laterality: Left;   CYSTOSCOPY/URETEROSCOPY/HOLMIUM LASER/STENT PLACEMENT Left 03/01/2024   Procedure: CYSTOSCOPY/URETEROSCOPY/HOLMIUM LASER/STENT PLACEMENT;  Surgeon: Sherrilee Belvie CROME, MD;  Location: [facility];  Service: Urology;  Laterality: Left;   EXTRACORPOREAL SHOCK WAVE LITHOTRIPSY Right 01/25/2023   Procedure: EXTRACORPOREAL SHOCK WAVE LITHOTRIPSY (ESWL);  Surgeon: [name], MD;  Location: [facility];  Service: Urology;  Laterality: Right;   FLEXIBLE SIGMOIDOSCOPY N/A 04/02/2015   Procedure: FLEXIBLE SIGMOIDOSCOPY;  Surgeon: Claudis RAYMOND Rivet, MD;  Location: [facility];  Service: Endoscopy;  Laterality: N/A;  1:45 - moved to 1/11 @ 8:30 - Ann to notify   HOLMIUM LASER APPLICATION Left 03/01/2024   Procedure: HOLMIUM LASER APPLICATION;  Surgeon: Sherrilee Belvie CROME, MD;  Location: [facility];  Service: Urology;  Laterality: Left;   KIDNEY STONE SURGERY     RADIOACTIVE SEED IMPLANT N/A 12/29/2023   Procedure: INSERTION, RADIATION SOURCE, PROSTATE;  Surgeon: Sherrilee Belvie CROME, MD;  Location: [facility];  Service: Urology;  Laterality: N/A;   SPACE OAR INSTILLATION N/A 12/29/2023   Procedure: INJECTION, HYDROGEL SPACER;  Surgeon: Sherrilee Belvie CROME, MD;  Location: [facility];  Service: Urology;  Laterality: N/A;   TOTAL KNEE ARTHROPLASTY Left 05/02/2017   Procedure: LEFT TOTAL KNEE ARTHROPLASTY;  Surgeon: Melodi Lerner, MD;  Location: [facility];  Service: Orthopedics;  Laterality: Left;    Allergies: Allergies[1]  Medications:   Prior to Admission medications  Medication Sig Start Date End Date Taking? Authorizing Provider  amLODipine  (NORVASC ) 5 MG tablet Take 1 tablet (5 mg total) by mouth daily. 02/23/24 03/24/24  Vernon Ranks, MD  aspirin  EC 81 MG tablet Take 81 mg by mouth daily.     [provider]  Cholecalciferol  (VITAMIN D -3 PO) Take 1 capsule by mouth daily.    [provider]  colchicine  0.6 MG tablet Take 1 tablet (0.6 mg total) by mouth 2 (two) times daily for 10 days. 02/13/24 02/23/24  Sebastian Toribio GAILS, MD  CRANBERRY PO Take 2 capsules by mouth daily.    [provider]  Cyanocobalamin  (VITAMIN B-12) 2500 MCG SUBL Place 2,500 mcg under the tongue daily.    [provider]  DAPTOmycin  (CUBICIN ) 500 MG injection Inject into the vein. 02/23/24   [provider]  daptomycin  (CUBICIN ) IVPB Inject 700 mg into the vein daily for 26 days. Indication:  MRSE bacteremia First Dose: Yes Last Day of Therapy:  03/20/24 Labs - Once weekly:  CBC/D, BMP, and CPK Labs - Once weekly: ESR and CRP Method of administration: IV Push Method of administration may be changed at the discretion of  home infusion pharmacist based upon assessment of the patient and/or caregiver's ability to self-administer the medication ordered. 02/23/24 03/20/24  Manandhar, Sabina, MD  glipiZIDE (GLUCOTROL XL) 2.5 MG 24 hr tablet Take 2.5 mg by mouth daily. 11/26/22   [provider]  Glucose Blood (BLOOD GLUCOSE TEST STRIPS) STRP 1 each by Does not apply route as directed. Dispense based on patient and insurance preference. Use up to four times daily as directed. (FOR ICD-10 E10.9, E11.9). 02/13/24   Sebastian Toribio GAILS, MD  heparin  100 units/mL SOLN by Dialysis route daily. 1 dose of an unknown amount    [provider]  ibuprofen (ADVIL) 200 MG tablet Take 400 mg by mouth every 6 (six) hours as needed for headache (pain).    [provider]  [Paused] indomethacin (INDOCIN) 25 MG capsule Take 25 mg by mouth 2 (two) times daily with a meal. Wait to take this until your doctor or other care provider tells you to start again. 05/20/21   [provider]  Lancets MISC 1 each by Does not apply route as directed. Dispense based on patient and  insurance preference. Use up to four times daily as directed. (FOR ICD-10 E10.9, E11.9). 02/13/24   Sebastian Toribio GAILS, MD  linezolid  (ZYVOX ) 600 MG tablet Take 1 tablet (600 mg total) by mouth 2 (two) times daily for 14 days. 03/07/24 03/21/24  Waddell Alan PARAS, RPH-CPP  meclizine  (ANTIVERT ) 25 MG tablet Take 25 mg by mouth 2 (two) times daily.    [provider]  Melatonin 10 MG CHEW Chew 20 mg by mouth at bedtime.    [provider]  metoprolol  succinate (TOPROL -XL) 25 MG 24 hr tablet Take 25 mg by mouth in the morning and at bedtime. 09/01/20   [provider]  AISHA FLING TEST test strip 1 each by Other route daily. 02/13/24   Sebastian Toribio GAILS, MD  OVER THE COUNTER MEDICATION Take 1 capsule by mouth daily. OTC Nervive; nerve relief.    [provider]  OVER THE COUNTER MEDICATION Take 2 tablets by mouth daily. OTC Joint and Muscle support.    [provider]  oxyCODONE -acetaminophen  (PERCOCET/ROXICET) 5-325 MG tablet Take 1 tablet by mouth every 4 (four) hours as needed for severe pain (pain score 7-10). 03/01/24   McKenzie, Belvie CROME, MD  Phenazopyridine HCl (URISTAT ULTRA PO) Take 1 tablet by mouth 2 (two) times daily as needed (urinary pain).    [provider]  rosuvastatin  (CRESTOR ) 5 MG tablet Take 5 mg by mouth every Saturday.    [provider]  tamsulosin  (FLOMAX ) 0.4 MG CAPS capsule Take 1 capsule (0.4 mg total) by mouth daily. 02/03/24   McKenzie, Belvie CROME, MD  traMADol  (ULTRAM ) 50 MG tablet Take 50 mg by mouth 3 (three) times daily as needed for moderate pain (pain score 4-6). 01/23/24   [provider]  TURMERIC CURCUMIN PO Take 1 tablet by mouth daily. OTC Ultra-absorbable curcumin.    [provider]    Inpatient medications:  enoxaparin  (LOVENOX ) injection  30 mg Subcutaneous Q24H   insulin  aspart  0-9 Units Subcutaneous TID WC   linezolid   600 mg Oral Q12H   nystatin   5 mL Oral QID     Discontinued Meds:   Medications Discontinued During This Encounter  Medication Reason   vancomycin  (VANCOCIN ) IVPB 1000 mg/200 mL premix    fentaNYL  (SUBLIMAZE ) injection 12.5 mcg    sodium bicarbonate  150 mEq in sterile water  1,150 mL infusion  Social History:  reports that he has quit smoking. His smoking use included cigarettes. He has never used smokeless tobacco. He reports that he does not drink alcohol and does not use drugs.  Family History:  History reviewed. No pertinent family history.  Pertinent items are noted in HPI. Weight change:   Intake/Output Summary (Last 24 hours) at 03/08/2024 0815 Last data filed at 03/07/2024 1630 Gross per 24 hour  Intake 720 ml  Output --  Net 720 ml   BP (!) 147/77 (BP Location: Left Arm)   Pulse 98   Temp 98.6 F (37 C) (Oral)   Resp 19   Ht 5' 11 (1.803 m)   Wt 97.4 kg   SpO2 96%   BMI 29.95 kg/m  Vitals:   03/07/24 1345 03/07/24 1949 03/08/24 0002 03/08/24 0403  BP:  (!) 142/72 (!) 141/72 (!) 147/77  Pulse: 86 81 97 98  Resp: (!) 23 20 18 19   Temp:  97.7 F (36.5 C) 99.1 F (37.3 C) 98.6 F (37 C)  TempSrc:  Oral Oral Oral  SpO2: 92% 94% 96% 96%  Weight:    97.4 kg  Height:         General appearance: alert, cooperative, and no distress Head: Normocephalic, without obvious abnormality, atraumatic Resp: clear to auscultation bilaterally Cardio: regular rate and rhythm, S1, S2 normal, no murmur, click, rub or gallop GI: soft, non-tender; bowel sounds normal; no masses,  no organomegaly Extremities: extremities normal, atraumatic, no cyanosis or edema  Labs: Basic Metabolic Panel: Recent Labs  Lab 03/07/24 1216 03/08/24 0444  NA 137 137  K 4.2 4.7  CL 100 97*  CO2 16* 25  GLUCOSE 97 99  BUN 84* 82*  CREATININE 6.87* 6.24*  ALBUMIN 3.2* 3.0*  CALCIUM  10.2 9.8  PHOS  --  5.1*   Liver Function Tests: Recent Labs  Lab 03/07/24 1216 03/08/24 0444  AST 59* 49*  ALT 13 12  ALKPHOS 135* 119   BILITOT 0.9 1.2  PROT 8.3* 7.4  ALBUMIN 3.2* 3.0*   No results for input(s): LIPASE, AMYLASE in the last 168 hours. No results for input(s): AMMONIA in the last 168 hours. CBC: Recent Labs  Lab 03/07/24 1216 03/08/24 0444  WBC 9.1 7.3  NEUTROABS 6.4  --   HGB 10.9* 10.0*  HCT 34.0* 31.4*  MCV 89.5 89.2  PLT 497* 448*   PT/INR: @LABRCNTIP (inr:5) Cardiac Enzymes: )No results for input(s): CKTOTAL, CKMB, CKMBINDEX, TROPONINI in the last 168 hours. CBG: Recent Labs  Lab 03/01/24 0940 03/07/24 1623 03/07/24 1956 03/08/24 0257 03/08/24 0730  GLUCAP 164* 126* 88 104* 100*    Iron Studies: No results for input(s): IRON, TIBC, TRANSFERRIN, FERRITIN in the last 168 hours.  Xrays/Other Studies: CT Renal Stone Study Result Date: 03/07/2024 CLINICAL DATA:  Abdominal/flank pain.  Weakness anorexia. EXAM: CT ABDOMEN AND PELVIS WITHOUT CONTRAST TECHNIQUE: Multidetector CT imaging of the abdomen and pelvis was performed following the standard protocol without IV contrast. RADIATION DOSE REDUCTION: This exam was performed according to the departmental dose-optimization program which includes automated exposure control, adjustment of the mA and/or kV according to patient size and/or use of iterative reconstruction technique. COMPARISON:  02/18/2024. FINDINGS: Lower chest: Severe patchy bilateral airspace consolidation in the lung bases, incompletely visualized. Heart is mildly enlarged. No pericardial or pleural effusion. Distal esophagus is grossly unremarkable. Hepatobiliary: Liver and gallbladder are unremarkable. No biliary ductal dilatation. Pancreas: Negative. Spleen: Negative. Adrenals/Urinary Tract: Adrenal glands are unremarkable. Bilateral renal stones. Low-attenuation  lesions in the kidneys. No specific follow-up necessary. Right ureter is decompressed. Double-J left ureteral stent in place with the proximal loop in the left renal pelvis and distal loop in the  bladder. Minimal left ureteral prominence, similar to 02/18/2024. Previously seen mid left ureteral stone is no longer visualized. Tiny dependent stones in the bladder. Locules of non dependent air are presumably iatrogenic in etiology. Stomach/Bowel: Stomach, small bowel, appendix and colon are unremarkable. Vascular/Lymphatic: Atherosclerotic calcification of the aorta. No pathologically enlarged lymph nodes. Reproductive: Brachytherapy seeds in the prostate. Other: No free fluid.  Mesenteries and peritoneum are unremarkable. Musculoskeletal: Degenerative changes in the spine. Osteopenia. Minimal grade 1 anterolisthesis of L4 on L5 and L5 on S1. Old T12 and L1 compression fractures. IMPRESSION: 1. Multifocal consolidation in the lung bases, indicative of pneumonia. Organizing pneumonia is not excluded. Please correlate clinically. Findings are only partially imaged. Consider CT chest without contrast in further evaluation, as clinically indicated. 2. Bilateral renal stones and tiny bladder stones. Double-J left ureteral stent in place with minimal prominence of the left ureter, unchanged. Previously seen mid left ureteral stone is no longer visualized. 3.  Aortic atherosclerosis (ICD10-I70.0). Electronically Signed   By: Newell Eke M.D.   On: 03/07/2024 14:37   DG Chest Port 1 View Result Date: 03/07/2024 CLINICAL DATA:  Worsening weakness over the past several days. Possible sepsis. EXAM: PORTABLE CHEST 1 VIEW COMPARISON:  02/18/2024 FINDINGS: Lungs are hypoinflated with patchy bilateral airspace opacification likely multifocal pneumonia as this is slightly worse. Possible small amount left pleural fluid unchanged. Cardiomediastinal silhouette and remainder of the exam is unchanged. IMPRESSION: Slight worsening patchy bilateral airspace process likely multifocal pneumonia. Possible small amount left pleural fluid unchanged. Electronically Signed   By: Toribio Agreste M.D.   On: 03/07/2024 13:19      Assessment/Plan:  AKI/CKD stage IIIb - in setting of poor po intake for more than a week.  Likely pre-renal insult but he has also been on antibiotics for several weeks so AIN is also on the differential diagnosis.  CT stone protocol with proper placement of left JJ ureteral stent, no hydronephrosis.  Started on isotonic bicarb with improvement of BUN/Cr.  Agree with continuing IVF's but may need to change to LR or NS given rising Co2.  Continue to follow UOP and Scr closely. Avoid nephrotoxic medications including NSAIDs and iodinated intravenous contrast exposure unless the latter is absolutely indicated.   Preferred narcotic agents for pain control are hydromorphone, fentanyl , and methadone. Morphine  should not be used.  Avoid Baclofen and avoid oral sodium phosphate  and magnesium  citrate based laxatives / bowel preps.  Continue strict Input and Output monitoring.  Will monitor the patient closely with you and intervene or adjust therapy as indicated by changes in clinical status/labs  AGMA - due to #1.  Improved with isotonic bicarb.  Ok to change to IV lactated ringers  or NS and follow Multifocal pneumonia - per primary svc and ID Generalized weakness - likely due to #1 and poor po intake for 7-10 days.  Will need PT/OT eval Bilateral nephrolithiasis - s/p left JJ ureteral stent on 03/01/24 and to have it removed on 03/12/24 HTN - stable Anemia of chronic disease MRSE bacteremia - PICC line out and had been on IV daptomycin .  Per ID, zyvox  600 mg bid through 03/20/24 Dm type 2 - per primary   Fairy A Trishna Cwik 03/08/2024, 8:15 AM       [1]  Allergies Allergen Reactions   Dilaudid [  Hydromorphone Hcl] Anaphylaxis and Other (See Comments)    Cardiac arrest

## 2024-03-08 NOTE — Progress Notes (Addendum)
 PROGRESS NOTE   Marcus Simpson  FMW:[id number] DOB: 08/29/1945 DOA: 03/07/2024 PCP: [name], MD   Chief Complaint  Patient presents with   Weakness   Level of care: Med-Surg  Brief Admission History:  78 year old male with complex past medical history including type 2 diabetes mellitus, hypertension, anemia of chronic disease, stage IIIb CKD, history of prostate cancer, gout, history of left acute pyelonephritis, left ureteral stone status post left ureteral stent placement by Dr. Sherrilee, MRSA bacteremia, Pseudomonas bacteremia, recently discharged with a PICC line and IV daptomycin  for presumed IE since patient refused TEE.  TTE was negative for vegetation.  Unfortunately patient reports that for the past several days he has been feeling unwell with progressive weakness.  He also reports having fever at home.  He pulled his right PICC line out and refused further IV antibiotics.  Home health nursing called infectious disease doctor who recommended that he take Zyvox  600 mg twice daily through end date of 03/20/2024 which was his original end date.  Patient and wife strongly felt that the treatments he was receiving was causing his progressive weakness and debility.  He has had poor oral intake over the past several days, poor appetite and reports that he is supposed to have his left ureteral stent removed by Dr. Sherrilee on Monday, 03/12/2024.  On arrival he was noted to be clinically dehydrated and ill-appearing.  His creatinine had risen to 6.87, up from 1.87 on 02/22/2024.  His hemoglobin is stable at 10.9.  His lactic acid was 2.0.  His CO2 was 16.  His AST was 59, ALT 13, GFR estimated at 8 mL/min.  His urinalysis was positive for hemoglobin, moderate leukocytes protein, rare bacteria 21-50 WBC per high-power field.  CT stone study suggesting consolidation in the lung bases, bilateral renal stones and bladder stones, left ureteral stone in place than previously seen left ureteral stone no  longer present.  Patient was given IV fluids and admission requested for AKI management.   Assessment and Plan:  AKI on CKD stage 3b -- hopefully from poor oral intake and this is prerenal -- hopefully his renal function will improve with IV fluid hydration -- sodium bicarbonate  infusion completed with resolution of metabolic acidosis -- follow urine output as he has only produced a small amount of urine in the ED -- hopefully he does not become anuric -- follow daily renal function labs -- reporting difficulty urinating, foley cath ordered    Generalized Weakness -- suspect this is all precipitated from the AKI  -- treating supportively for now   Bilateral nephrolithiasis -- no signs of obstruction at this time -- pt is due to have ureteral stent removed by Dr. Sherrilee on 03/12/24 -- will notify Dr. Sherrilee of admission   Question of pneumonia -- ordered CT chest without contrast for further investigation and work up   Oral Candidiasis -- fluconazole  ordered -- continue nystatin  swish   Essential hypertension  -- stable, follow -- resume home meds as able    MRSE bacteremia -- pt had a PICC line and was on IV daptomycin  until 12/17 when he removed PICC line and refused further IV antibiotics -- the home heath RN  had contacted ID physician and the recommendation was to start zyvox  600 mg BID to complete course of treatment thru 03/20/24   Anemia of chronic disease -- Hg stable from recent testing around 10 -- Hg may trend down from hemodilution with IV fluids ordered   Metabolic Acidosis--resolved  --  secondary to worsening renal failure  -- sodium bicarbonate  IV infusion completed    Bacteremia due to pseudomonas-completed treatment  -- pt reports he completed the course of antibiotics for this    Type 2 DM, controlled with renal complications -- SSI coverage with frequent CBG monitoring ordered CBG (last 3)  Recent Labs    03/08/24 0257 03/08/24 0730  03/08/24 1133  GLUCAP 104* 100* 141*    DVT prophylaxis: enoxaparin  Code Status: Full  Family Communication: wife at bedside updated 12/17-18 Disposition:   Consultants:  Nephrology  Procedures:   Antimicrobials:  Zyvox  >>  Subjective: Pt reports having a difficult time urinating, wanting a foley cath, overall reports poor urine output.   Objective: Vitals:   03/07/24 1949 03/08/24 0002 03/08/24 0403 03/08/24 1300  BP: (!) 142/72 (!) 141/72 (!) 147/77 (!) 140/72  Pulse: 81 97 98 99  Resp: 20 18 19 20   Temp: 97.7 F (36.5 C) 99.1 F (37.3 C) 98.6 F (37 C) (!) 97.5 F (36.4 C)  TempSrc: Oral Oral Oral Axillary  SpO2: 94% 96% 96% 96%  Weight:   97.4 kg   Height:        Intake/Output Summary (Last 24 hours) at 03/08/2024 1350 Last data filed at 03/08/2024 1302 Gross per 24 hour  Intake 1259.1 ml  Output --  Net 1259.1 ml   Filed Weights   03/07/24 1202 03/08/24 0403  Weight: 103.8 kg 97.4 kg   Examination:  General exam: Appears calm and comfortable Oral: yeast lesions seen in mouth   Respiratory system: Clear to auscultation. Respiratory effort normal. Cardiovascular system: normal S1 & S2 heard. No JVD, murmurs, rubs, gallops or clicks. No pedal edema. Gastrointestinal system: Abdomen is nondistended, soft and nontender. No organomegaly or masses felt. Normal bowel sounds heard. Central nervous system: Alert and oriented. No focal neurological deficits. Extremities: Symmetric 5 x 5 power. Skin: No rashes, lesions or ulcers. Psychiatry: Judgement and insight appear normal. Mood & affect appropriate.   Data Reviewed: I have personally reviewed following labs and imaging studies  CBC: Recent Labs  Lab 03/07/24 1216 03/08/24 0444  WBC 9.1 7.3  NEUTROABS 6.4  --   HGB 10.9* 10.0*  HCT 34.0* 31.4*  MCV 89.5 89.2  PLT 497* 448*    Basic Metabolic Panel: Recent Labs  Lab 03/07/24 1216 03/08/24 0444  NA 137 137  K 4.2 4.7  CL 100 97*  CO2 16* 25   GLUCOSE 97 99  BUN 84* 82*  CREATININE 6.87* 6.24*  CALCIUM  10.2 9.8  MG  --  2.1  PHOS  --  5.1*    CBG: Recent Labs  Lab 03/07/24 1623 03/07/24 1956 03/08/24 0257 03/08/24 0730 03/08/24 1133  GLUCAP 126* 88 104* 100* 141*    Recent Results (from the past 240 hours)  Blood culture (routine x 2)     Status: None (Preliminary result)   Collection Time: 03/07/24 12:16 PM   Specimen: BLOOD  Result Value Ref Range Status   Specimen Description BLOOD LEFT ANTECUBITAL  Final   Special Requests   Final    BOTTLES DRAWN AEROBIC AND ANAEROBIC Blood Culture adequate volume   Culture   Final    NO GROWTH < 24 HOURS Performed at [facility], [address], [place], [place] [address]    Report Status PENDING  Incomplete  Blood culture (routine x 2)     Status: None (Preliminary result)   Collection Time: 03/07/24 12:30 PM   Specimen: BLOOD  RIGHT ARM  Result Value Ref Range Status   Specimen Description BLOOD RIGHT ARM  Final   Special Requests   Final    BOTTLES DRAWN AEROBIC ONLY Blood Culture adequate volume   Culture   Final    NO GROWTH < 24 HOURS Performed at [facility], [address], [place], [place] [address]    Report Status PENDING  Incomplete  Urine Culture     Status: None   Collection Time: 03/07/24 12:46 PM   Specimen: Urine, Random  Result Value Ref Range Status   Specimen Description   Final    URINE, RANDOM Performed at [facility], [address] [place], [place] [address]    Special Requests   Final    NONE Reflexed from [id number] Performed at [facility], [address], [place], [place] [address]    Culture   Final    NO GROWTH Performed at [facility], [address] [place], [place] [address]    Report Status 03/08/2024 FINAL  Final     Radiology Studies: CT Renal Stone Study Result Date: 03/07/2024 CLINICAL DATA:  Abdominal/flank pain.  Weakness anorexia. EXAM: CT ABDOMEN AND PELVIS WITHOUT CONTRAST TECHNIQUE: Multidetector CT  imaging of the abdomen and pelvis was performed following the standard protocol without IV contrast. RADIATION DOSE REDUCTION: This exam was performed according to the departmental dose-optimization program which includes automated exposure control, adjustment of the mA and/or kV according to patient size and/or use of iterative reconstruction technique. COMPARISON:  02/18/2024. FINDINGS: Lower chest: Severe patchy bilateral airspace consolidation in the lung bases, incompletely visualized. Heart is mildly enlarged. No pericardial or pleural effusion. Distal esophagus is grossly unremarkable. Hepatobiliary: Liver and gallbladder are unremarkable. No biliary ductal dilatation. Pancreas: Negative. Spleen: Negative. Adrenals/Urinary Tract: Adrenal glands are unremarkable. Bilateral renal stones. Low-attenuation lesions in the kidneys. No specific follow-up necessary. Right ureter is decompressed. Double-J left ureteral stent in place with the proximal loop in the left renal pelvis and distal loop in the bladder. Minimal left ureteral prominence, similar to 02/18/2024. Previously seen mid left ureteral stone is no longer visualized. Tiny dependent stones in the bladder. Locules of non dependent air are presumably iatrogenic in etiology. Stomach/Bowel: Stomach, small bowel, appendix and colon are unremarkable. Vascular/Lymphatic: Atherosclerotic calcification of the aorta. No pathologically enlarged lymph nodes. Reproductive: Brachytherapy seeds in the prostate. Other: No free fluid.  Mesenteries and peritoneum are unremarkable. Musculoskeletal: Degenerative changes in the spine. Osteopenia. Minimal grade 1 anterolisthesis of L4 on L5 and L5 on S1. Old T12 and L1 compression fractures. IMPRESSION: 1. Multifocal consolidation in the lung bases, indicative of pneumonia. Organizing pneumonia is not excluded. Please correlate clinically. Findings are only partially imaged. Consider CT chest without contrast in further  evaluation, as clinically indicated. 2. Bilateral renal stones and tiny bladder stones. Double-J left ureteral stent in place with minimal prominence of the left ureter, unchanged. Previously seen mid left ureteral stone is no longer visualized. 3.  Aortic atherosclerosis (ICD10-I70.0). Electronically Signed   By: Newell Eke M.D.   On: 03/07/2024 14:37   DG Chest Port 1 View Result Date: 03/07/2024 CLINICAL DATA:  Worsening weakness over the past several days. Possible sepsis. EXAM: PORTABLE CHEST 1 VIEW COMPARISON:  02/18/2024 FINDINGS: Lungs are hypoinflated with patchy bilateral airspace opacification likely multifocal pneumonia as this is slightly worse. Possible small amount left pleural fluid unchanged. Cardiomediastinal silhouette and remainder of the exam is unchanged. IMPRESSION: Slight worsening patchy bilateral airspace process likely multifocal  pneumonia. Possible small amount left pleural fluid unchanged. Electronically Signed   By: Toribio Agreste M.D.   On: 03/07/2024 13:19    Scheduled Meds:  Chlorhexidine  Gluconate Cloth  6 each Topical Daily   enoxaparin  (LOVENOX ) injection  30 mg Subcutaneous Q24H   insulin  aspart  0-9 Units Subcutaneous TID WC   linezolid   600 mg Oral Q12H   nystatin   5 mL Oral QID   Continuous Infusions:  lactated ringers  70 mL/hr at 03/08/24 1159     LOS: 1 day   Time spent: 55 mins  Sherrine Salberg Vicci, MD How to contact the [facility] Attending or Consulting provider 7A - 7P or covering provider during after hours 7P -7A, for this patient?  Check the care team in [facility] and look for a) attending/consulting TRH provider listed and b) the TRH team listed Log into [email or web address] to find provider on call.  Locate the TRH provider you are looking for under [facility] and page to a number that you can be directly reached. If you still have difficulty reaching the provider, please page the [facility] (Director on Call) for the Hospitalists listed on amion for  assistance.  03/08/2024, 1:50 PM

## 2024-03-08 NOTE — Plan of Care (Signed)

## 2024-03-08 NOTE — Significant Event (Signed)
° °      CROSS COVER NOTE  NAME: Marcus Simpson MRN: [id number] DOB : [birth year] ATTENDING PHYSICIAN: Vicci Afton CROME, MD    Date of Service   03/08/2024   HPI/Events of Note   TRH Cross Cover at [facility] HPI labs clinical course etc reviewed  Interventions   Assessment/Plan: Oral nystatin  suspension started for thrush        Erminio CROME [facility] NP [facility] Cross Cover 7pm-7am - check amion for availability Pager [phone]

## 2024-03-08 NOTE — TOC Initial Note (Signed)
 Transition of Care [facility]) - Initial/Assessment Note    Patient Details  Name: Marcus Simpson MRN: [id number] Date of Birth: 06-Mar-1946  [facility] CM/SW Contact:    Lucie Lunger, LCSWA Phone Number: 03/08/2024, 1:44 PM  Clinical Narrative:                 Pt is high risk for readmission. CSW spoke with pt and family at bedside to complete assessment. Pt is from home with his wife. Pt is able to complete ADLs independently and able to drive when needed. Pt has a rollator and shower chair in the home to use when needed. Pt has HH currently with [facility], CSW reached out to [place] to confirm what services. TOC to follow.   Expected Discharge Plan: Home w Home Health Services Barriers to Discharge: Continued Medical Work up   Patient Goals and CMS Choice Patient states their goals for this hospitalization and ongoing recovery are:: return home CMS Medicare.gov Compare Post Acute Care list provided to:: Patient Choice offered to / list presented to : Patient      Expected Discharge Plan and Services In-house Referral: Clinical Social Work Discharge Planning Services: CM Consult Post Acute Care Choice: Home Health Living arrangements for the past 2 months: Single Family Home                             HH Agency: [facility] Date [facility] Agency Contacted: 03/08/24   Representative spoke with at [facility] Agency: [place]  Prior Living Arrangements/Services Living arrangements for the past 2 months: Single Family Home Lives with:: Spouse Patient language and need for interpreter reviewed:: Yes Do you feel safe going back to the place where you live?: Yes      Need for Family Participation in Patient Care: Yes (Comment) Care giver support system in place?: Yes (comment) Current home services: DME Criminal Activity/Legal Involvement Pertinent to Current Situation/Hospitalization: No - Comment as needed  Activities of Daily Living      Permission  Sought/Granted                  Emotional Assessment Appearance:: Appears stated age Attitude/Demeanor/Rapport: Engaged Affect (typically observed): Accepting Orientation: : Oriented to Self, Oriented to Place, Oriented to  Time, Oriented to Situation Alcohol / Substance Use: Not Applicable Psych Involvement: No (comment)  Admission diagnosis:  AKI (acute kidney injury) [N17.9] HCAP (healthcare-associated pneumonia) [J18.9] Patient Active Problem List   Diagnosis Date Noted   AKI (acute kidney injury) 03/07/2024   Generalized weakness 03/07/2024   Microcytic anemia 03/07/2024   Pneumonia 03/07/2024   Metabolic acidosis 03/07/2024   Bilateral nephrolithiasis 03/07/2024   Bacteremia due to Pseudomonas 02/21/2024   Septic shock (HCC) 02/18/2024   Acute gout 02/11/2024   Effusion of right knee joint 02/10/2024   Bacteremia due to methicillin resistant Staphylococcus epidermidis 02/08/2024   Acute pyelonephritis 02/08/2024   Left ureteral calculus 02/08/2024   Anemia of chronic disease 02/08/2024   Well controlled type 2 diabetes mellitus (HCC) 02/08/2024   Bilateral lower extremity edema 02/08/2024   Severe sepsis (HCC) 02/08/2024   Acute kidney injury due to urinary tract obstruction 02/05/2024   Sepsis secondary to UTI (HCC) 12/29/2022   Acute encephalopathy 12/29/2022   Hydronephrosis with obstructing calculus 12/29/2022   Ureteral stone with hydronephrosis 12/28/2022   OA (osteoarthritis) of knee 05/02/2017   Diarrhea 03/10/2015   Prostate cancer (HCC) 03/10/2015  Hyperlipidemia 11/13/2008   Essential hypertension 11/13/2008   SYNCOPE AND COLLAPSE 11/13/2008   PCP:  [name], MD Pharmacy:   [facility] - [place], [place] - [address] [place] [address] Phone: [phone] Fax: [phone]     Social Drivers of Health (SDOH) Social History: SDOH Screenings   Food Insecurity: No Food Insecurity (03/07/2024)  Housing: Low  Risk (03/07/2024)  Transportation Needs: No Transportation Needs (03/07/2024)  Utilities: Not At Risk (03/07/2024)  Alcohol Screen: Low Risk (10/11/2023)  Depression (PHQ2-9): Low Risk (01/19/2024)  Social Connections: Moderately Integrated (03/07/2024)  Recent Concern: Social Connections - Moderately Isolated (02/05/2024)  Tobacco Use: Medium Risk (03/07/2024)   SDOH Interventions:     Readmission Risk Interventions    03/08/2024    1:38 PM 02/23/2024    3:25 PM  Readmission Risk Prevention Plan  Transportation Screening Complete Complete  PCP or Specialist Appt within 3-5 Days  Complete  HRI or Home Care Consult Complete Complete  Social Work Consult for Recovery Care Planning/Counseling Complete Complete  Palliative Care Screening Not Applicable Not Applicable  Medication Review Oceanographer)  Complete

## 2024-03-09 LAB — BASIC METABOLIC PANEL WITH GFR
Anion gap: 14 (ref 5–15)
BUN: 78 mg/dL — ABNORMAL HIGH (ref 8–23)
CO2: 25 mmol/L (ref 22–32)
Calcium: 9.8 mg/dL (ref 8.9–10.3)
Chloride: 98 mmol/L (ref 98–111)
Creatinine, Ser: 5.67 mg/dL — ABNORMAL HIGH (ref 0.61–1.24)
GFR, Estimated: 9 mL/min — ABNORMAL LOW
Glucose, Bld: 102 mg/dL — ABNORMAL HIGH (ref 70–99)
Potassium: 3.9 mmol/L (ref 3.5–5.1)
Sodium: 137 mmol/L (ref 135–145)

## 2024-03-09 LAB — GLUCOSE, CAPILLARY
Glucose-Capillary: 106 mg/dL — ABNORMAL HIGH (ref 70–99)
Glucose-Capillary: 102 mg/dL — ABNORMAL HIGH (ref 70–99)
Glucose-Capillary: 110 mg/dL — ABNORMAL HIGH (ref 70–99)
Glucose-Capillary: 108 mg/dL — ABNORMAL HIGH (ref 70–99)
Glucose-Capillary: 143 mg/dL — ABNORMAL HIGH (ref 70–99)

## 2024-03-09 LAB — CBC
HCT: 30.2 % — ABNORMAL LOW (ref 39.0–52.0)
Hemoglobin: 9.5 g/dL — ABNORMAL LOW (ref 13.0–17.0)
MCH: 28.2 pg (ref 26.0–34.0)
MCHC: 31.5 g/dL (ref 30.0–36.0)
MCV: 89.6 fL (ref 80.0–100.0)
Platelets: 424 K/uL — ABNORMAL HIGH (ref 150–400)
RBC: 3.37 MIL/uL — ABNORMAL LOW (ref 4.22–5.81)
RDW: 15.3 % (ref 11.5–15.5)
WBC: 5.5 K/uL (ref 4.0–10.5)
nRBC: 0 % (ref 0.0–0.2)

## 2024-03-09 LAB — PHOSPHORUS: Phosphorus: 4.8 mg/dL — ABNORMAL HIGH (ref 2.5–4.6)

## 2024-03-09 LAB — HEPATIC FUNCTION PANEL
ALT: 12 U/L (ref 0–44)
AST: 40 U/L (ref 15–41)
Albumin: 2.8 g/dL — ABNORMAL LOW (ref 3.5–5.0)
Alkaline Phosphatase: 126 U/L (ref 38–126)
Bilirubin, Direct: 0.8 mg/dL — ABNORMAL HIGH (ref 0.0–0.2)
Indirect Bilirubin: 0.3 mg/dL (ref 0.3–0.9)
Total Bilirubin: 1.1 mg/dL (ref 0.0–1.2)
Total Protein: 7 g/dL (ref 6.5–8.1)

## 2024-03-09 MED ORDER — SACCHAROMYCES BOULARDII 250 MG PO CAPS
250.0000 mg | ORAL_CAPSULE | Freq: Two times a day (BID) | ORAL | Status: DC
  Administered 2024-03-09 – 2024-03-15 (×12): 250 mg via ORAL
  Filled 2024-03-09 (×12): qty 1

## 2024-03-09 MED ORDER — SODIUM CHLORIDE 0.9 % IV SOLN: INTRAVENOUS | Status: DC

## 2024-03-09 MED ORDER — AMOXICILLIN-POT CLAVULANATE 500-125 MG PO TABS
1.0000 | ORAL_TABLET | Freq: Two times a day (BID) | ORAL | Status: AC
  Administered 2024-03-09 – 2024-03-13 (×10): 1 via ORAL
  Filled 2024-03-09 (×9): qty 1

## 2024-03-09 NOTE — Evaluation (Signed)
 Clinical/Bedside Swallow Evaluation Patient Details  Name: Marcus Simpson MRN: [id number] Date of Birth: [birth year]  Today's Date: 03/09/2024 Time: SLP Start Time (ACUTE ONLY): 1330 SLP Stop Time (ACUTE ONLY): 1349 SLP Time Calculation (min) (ACUTE ONLY): 19 min  Past Medical History:  Past Medical History:  Diagnosis Date   Arthritis    Bradycardia    asymptomatic    Diabetes mellitus without complication (HCC)    History of kidney stones    Hypertension    Prostate cancer [facility])    Past Surgical History:  Past Surgical History:  Procedure Laterality Date   COLONOSCOPY     CYSTOSCOPY WITH STENT PLACEMENT Right 12/28/2022   Procedure: CYSTOSCOPY WITH STENT PLACEMENT;  Surgeon: Carolee Sherwood [name], MD;  Location: [facility] OR;  Service: Urology;  Laterality: Right;   CYSTOSCOPY WITH STENT PLACEMENT Left 02/05/2024   Procedure: CYSTOSCOPY, WITH STENT INSERTION. URETHRAL DILATION;  Surgeon: Shane Steffan BROCKS, MD;  Location: [facility] ORS;  Service: Urology;  Laterality: Left;   CYSTOSCOPY/RETROGRADE/URETEROSCOPY/STONE EXTRACTION WITH BASKET Left 03/01/2024   Procedure: CYSTOSCOPY, WITH CALCULUS REMOVAL USING BASKET;  Surgeon: Sherrilee Belvie CROME, MD;  Location: [facility];  Service: Urology;  Laterality: Left;   CYSTOSCOPY/URETEROSCOPY/HOLMIUM LASER/STENT PLACEMENT Left 03/01/2024   Procedure: CYSTOSCOPY/URETEROSCOPY/HOLMIUM LASER/STENT PLACEMENT;  Surgeon: Sherrilee Belvie CROME, MD;  Location: [facility];  Service: Urology;  Laterality: Left;   EXTRACORPOREAL SHOCK WAVE LITHOTRIPSY Right 01/25/2023   Procedure: EXTRACORPOREAL SHOCK WAVE LITHOTRIPSY (ESWL);  Surgeon: [name], MD;  Location: [facility];  Service: Urology;  Laterality: Right;   FLEXIBLE SIGMOIDOSCOPY N/A 04/02/2015   Procedure: FLEXIBLE SIGMOIDOSCOPY;  Surgeon: Claudis RAYMOND Rivet, MD;  Location: [facility];  Service: Endoscopy;  Laterality: N/A;  1:45 - moved to 1/11 @ 8:30 - Ann to notify   HOLMIUM LASER APPLICATION Left 03/01/2024    Procedure: HOLMIUM LASER APPLICATION;  Surgeon: Sherrilee Belvie CROME, MD;  Location: [facility];  Service: Urology;  Laterality: Left;   KIDNEY STONE SURGERY     RADIOACTIVE SEED IMPLANT N/A 12/29/2023   Procedure: INSERTION, RADIATION SOURCE, PROSTATE;  Surgeon: Sherrilee Belvie CROME, MD;  Location: [facility];  Service: Urology;  Laterality: N/A;   SPACE OAR INSTILLATION N/A 12/29/2023   Procedure: INJECTION, HYDROGEL SPACER;  Surgeon: Sherrilee Belvie CROME, MD;  Location: [facility];  Service: Urology;  Laterality: N/A;   TOTAL KNEE ARTHROPLASTY Left 05/02/2017   Procedure: LEFT TOTAL KNEE ARTHROPLASTY;  Surgeon: Melodi Lerner, MD;  Location: [facility];  Service: Orthopedics;  Laterality: Left;   HPI:  78 year old male with complex past medical history including type 2 diabetes mellitus, hypertension, anemia of chronic disease, stage IIIb CKD, history of prostate cancer, gout, history of left acute pyelonephritis, left ureteral stone status post left ureteral stent placement by Dr. Sherrilee, MRSA bacteremia, Pseudomonas bacteremia, recently discharged with a PICC line and IV daptomycin  for presumed IE since patient refused TEE.  TTE was negative for vegetation.  Unfortunately patient reports that for the past several days he has been feeling unwell with progressive weakness.  He also reports having fever at home.  He pulled his right PICC line out and refused further IV antibiotics.  Home health nursing called infectious disease doctor who recommended that he take Zyvox  600 mg twice daily through end date of 03/20/2024 which was his original end date.  Patient and wife strongly felt that the treatments he was receiving was causing his progressive weakness and debility.  He has had poor oral intake over the past several  days, poor appetite and reports that he is supposed to have his left ureteral stent removed by Dr. Sherrilee on Monday, 03/12/2024.  On arrival he was noted to be clinically dehydrated and ill-appearing.  His  creatinine had risen to 6.87, up from 1.87 on 02/22/2024.  His hemoglobin is stable at 10.9.  His lactic acid was 2.0.  His CO2 was 16.  His AST was 59, ALT 13, GFR estimated at 8 mL/min.  His urinalysis was positive for hemoglobin, moderate leukocytes protein, rare bacteria 21-50 WBC per high-power field.  CT stone study suggesting consolidation in the lung bases, bilateral renal stones and bladder stones, left ureteral stone in place than previously seen left ureteral stone no longer present.  Patient was given IV fluids and admission requested for AKI management.    Assessment / Plan / Recommendation  Clinical Impression  Clinical swallowing evaluation completed while Pt was sitting upright in bed. Pt denies dysphagia but reports sores in oral cavity and very sore mouth. Pt consumed thin liquids and purees and consumed both without overt s/sx of oropharyngeal dysphagia. Pt pleasantly declined solid textures d/t sore mouth. SLP reviewed universal aspiration precautions. Recommend full liquid diet per Pt preference and MD recommendation; defer diet advancement to nursing/attending. There are no further ST needs indicated at this time, our service will sign off. Thank you SLP Visit Diagnosis: Dysphagia, unspecified (R13.10)    Aspiration Risk    MILD   Diet Recommendation    FULL LIQUID       Other Recommendations Oral Care Recommendations: Oral care BID     Swallow Evaluation Recommendations Recommendations: PO diet PO Diet Recommendation: Full liquid diet Liquid Administration via: Cup;Straw Medication Administration: Whole meds with liquid Supervision: Patient able to self-feed Swallowing strategies  : Slow rate;Small bites/sips Postural changes: Position pt fully upright for meals Oral care recommendations: Oral care BID (2x/day)     Swallow Study   General HPI: 78 year old male with complex past medical history including type 2 diabetes mellitus, hypertension, anemia of chronic  disease, stage IIIb CKD, history of prostate cancer, gout, history of left acute pyelonephritis, left ureteral stone status post left ureteral stent placement by Dr. Sherrilee, MRSA bacteremia, Pseudomonas bacteremia, recently discharged with a PICC line and IV daptomycin  for presumed IE since patient refused TEE.  TTE was negative for vegetation.  Unfortunately patient reports that for the past several days he has been feeling unwell with progressive weakness.  He also reports having fever at home.  He pulled his right PICC line out and refused further IV antibiotics.  Home health nursing called infectious disease doctor who recommended that he take Zyvox  600 mg twice daily through end date of 03/20/2024 which was his original end date.  Patient and wife strongly felt that the treatments he was receiving was causing his progressive weakness and debility.  He has had poor oral intake over the past several days, poor appetite and reports that he is supposed to have his left ureteral stent removed by Dr. Sherrilee on Monday, 03/12/2024.  On arrival he was noted to be clinically dehydrated and ill-appearing.  His creatinine had risen to 6.87, up from 1.87 on 02/22/2024.  His hemoglobin is stable at 10.9.  His lactic acid was 2.0.  His CO2 was 16.  His AST was 59, ALT 13, GFR estimated at 8 mL/min.  His urinalysis was positive for hemoglobin, moderate leukocytes protein, rare bacteria 21-50 WBC per high-power field.  CT stone study suggesting consolidation in  the lung bases, bilateral renal stones and bladder stones, left ureteral stone in place than previously seen left ureteral stone no longer present.  Patient was given IV fluids and admission requested for AKI management. Type of Study: Bedside Swallow Evaluation Previous Swallow Assessment: none in chart Diet Prior to this Study: Regular;Thin liquids (Level 0) Temperature Spikes Noted: No Respiratory Status: Room air History of Recent Intubation:  Yes Behavior/Cognition: Alert;Cooperative;Pleasant mood Oral Cavity Assessment: Within Functional Limits Oral Care Completed by SLP: Recent completion by staff Oral Cavity - Dentition: Edentulous;Dentures, top;Dentures, bottom Vision: Functional for self-feeding Self-Feeding Abilities: Able to feed self Patient Positioning: Upright in bed Baseline Vocal Quality: Normal Volitional Cough: Strong Volitional Swallow: Able to elicit    Oral/Motor/Sensory Function Overall Oral Motor/Sensory Function: Within functional limits   Ice Chips Ice chips: Within functional limits   Thin Liquid Thin Liquid: Within functional limits    Nectar Thick Nectar Thick Liquid: Not tested   Honey Thick Honey Thick Liquid: Not tested   Puree Puree: Within functional limits   Solid     Solid: Not tested     Marcus Simpson, CCC-SLP Speech Language Pathologist  Marcus Simpson 03/09/2024,2:08 PM

## 2024-03-09 NOTE — Care Management Important Message (Signed)
 Important Message  Patient Details  Name: Marcus Simpson MRN: [id number] Date of Birth: 06-Apr-1945   Important Message Given:  Yes - Medicare IM     Braleigh Massoud L Calin Fantroy 03/09/2024, 3:05 PM

## 2024-03-09 NOTE — Anesthesia Postprocedure Evaluation (Signed)
"   Anesthesia Post Note  Patient: Marcus Simpson  Procedure(s) Performed: CYSTOSCOPY/URETEROSCOPY/HOLMIUM LASER/STENT PLACEMENT (Left: Ureter) HOLMIUM LASER APPLICATION (Left: Ureter) CYSTOSCOPY, WITH CALCULUS REMOVAL USING BASKET (Left: Ureter)  Patient location during evaluation: Phase II Anesthesia Type: General Level of consciousness: awake Pain management: pain level controlled Vital Signs Assessment: post-procedure vital signs reviewed and stable Respiratory status: spontaneous breathing and respiratory function stable Cardiovascular status: blood pressure returned to baseline and stable Postop Assessment: no headache and no apparent nausea or vomiting Anesthetic complications: no Comments: Late entry   No notable events documented.   Last Vitals:  Vitals:   03/01/24 1130 03/01/24 1142  BP: 127/68 126/67  Pulse: 81 83  Resp: 12 12  Temp: 36.6 C 36.6 C  SpO2: 96% 93%    Last Pain:  Vitals:   03/02/24 1440  TempSrc:   PainSc: 0-No pain                 [name]      "

## 2024-03-09 NOTE — Progress Notes (Signed)
 Patient ID: Marcus Simpson, male   DOB: 14-Aug-1945, 78 y.o.   MRN: [id number] S: Feeling a little better this morning.  Eating more.  O:BP (!) 147/71 (BP Location: Right Arm)   Pulse 100   Temp 98.3 F (36.8 C) (Oral)   Resp 20   Ht 5' 11 (1.803 m)   Wt 97.4 kg   SpO2 92%   BMI 29.95 kg/m   Intake/Output Summary (Last 24 hours) at 03/09/2024 0938 Last data filed at 03/09/2024 0526 Gross per 24 hour  Intake 1531.85 ml  Output 650 ml  Net 881.85 ml   Intake/Output: I/O last 3 completed shifts: In: 1531.9 [P.O.:480; I.V.:1051.9] Out: 650 [Urine:650]  Intake/Output this shift:  No intake/output data recorded. Weight change: -6.4 kg Gen: NAD CVS: tachy at 100 Resp: CTA Abd: +BS< soft, NT/nD  Ext:no edema  Recent Labs  Lab 03/07/24 1216 03/08/24 0444 03/09/24 0420  NA 137 137 137  K 4.2 4.7 3.9  CL 100 97* 98  CO2 16* 25 25  GLUCOSE 97 99 102*  BUN 84* 82* 78*  CREATININE 6.87* 6.24* 5.67*  ALBUMIN 3.2* 3.0* 2.8*  CALCIUM  10.2 9.8 9.8  PHOS  --  5.1* 4.8*  AST 59* 49* 40  ALT 13 12 12    Liver Function Tests: Recent Labs  Lab 03/07/24 1216 03/08/24 0444 03/09/24 0420  AST 59* 49* 40  ALT 13 12 12   ALKPHOS 135* 119 126  BILITOT 0.9 1.2 1.1  PROT 8.3* 7.4 7.0  ALBUMIN 3.2* 3.0* 2.8*   No results for input(s): LIPASE, AMYLASE in the last 168 hours. No results for input(s): AMMONIA in the last 168 hours. CBC: Recent Labs  Lab 03/07/24 1216 03/08/24 0444 03/09/24 0420  WBC 9.1 7.3 5.5  NEUTROABS 6.4  --   --   HGB 10.9* 10.0* 9.5*  HCT 34.0* 31.4* 30.2*  MCV 89.5 89.2 89.6  PLT 497* 448* 424*   Cardiac Enzymes: No results for input(s): CKTOTAL, CKMB, CKMBINDEX, TROPONINI in the last 168 hours. CBG: Recent Labs  Lab 03/08/24 1133 03/08/24 1732 03/08/24 2101 03/09/24 0305 03/09/24 0830  GLUCAP 141* 121* 117* 106* 102*    Iron Studies: No results for input(s): IRON, TIBC, TRANSFERRIN, FERRITIN in the last 72  hours. Studies/Results: CT CHEST WO CONTRAST Result Date: 03/08/2024 EXAM: CT CHEST WITHOUT CONTRAST 03/08/2024 03:22:11 PM TECHNIQUE: CT of the chest was performed without the administration of intravenous contrast. Multiplanar reformatted images are provided for review. Automated exposure control, iterative reconstruction, and/or weight based adjustment of the mA/kV was utilized to reduce the radiation dose to as low as reasonably achievable. COMPARISON: CT and x-ray from 03/07/2024 and CT chest 10/30/2023. CLINICAL HISTORY: question of pneumonia (multifocal) FINDINGS: MEDIASTINUM: Heart and pericardium are unremarkable. The central airways are clear. LYMPH NODES: No mediastinal, hilar or axillary lymphadenopathy. LUNGS AND PLEURA: Peribronchovascular and peripheral consolidative opacities diffusely throughout both lungs. No pleural effusion. No pneumothorax. SOFT TISSUES/BONES: No acute abnormality of the bones or soft tissues. UPPER ABDOMEN: Limited images of the upper abdomen demonstrate bilateral nonobstructing nephrolithiasis. IMPRESSION: 1. Peribronchovascular and peripheral consolidative opacities diffusely throughout both lungs, most consistent with multifocal pneumonia; differential includes but is not limited to: organizing pneumonia, aspiration pneumonitis, and drug/toxin-related pneumonitis. Prominent Electronically signed by: Norman Gatlin MD 03/08/2024 08:04 PM EST RP Workstation: [id number]   CT Renal Stone Study Result Date: 03/07/2024 CLINICAL DATA:  Abdominal/flank pain.  Weakness anorexia. EXAM: CT ABDOMEN AND PELVIS WITHOUT CONTRAST TECHNIQUE: Multidetector CT imaging of  the abdomen and pelvis was performed following the standard protocol without IV contrast. RADIATION DOSE REDUCTION: This exam was performed according to the departmental dose-optimization program which includes automated exposure control, adjustment of the mA and/or kV according to patient size and/or use of iterative  reconstruction technique. COMPARISON:  02/18/2024. FINDINGS: Lower chest: Severe patchy bilateral airspace consolidation in the lung bases, incompletely visualized. Heart is mildly enlarged. No pericardial or pleural effusion. Distal esophagus is grossly unremarkable. Hepatobiliary: Liver and gallbladder are unremarkable. No biliary ductal dilatation. Pancreas: Negative. Spleen: Negative. Adrenals/Urinary Tract: Adrenal glands are unremarkable. Bilateral renal stones. Low-attenuation lesions in the kidneys. No specific follow-up necessary. Right ureter is decompressed. Double-J left ureteral stent in place with the proximal loop in the left renal pelvis and distal loop in the bladder. Minimal left ureteral prominence, similar to 02/18/2024. Previously seen mid left ureteral stone is no longer visualized. Tiny dependent stones in the bladder. Locules of non dependent air are presumably iatrogenic in etiology. Stomach/Bowel: Stomach, small bowel, appendix and colon are unremarkable. Vascular/Lymphatic: Atherosclerotic calcification of the aorta. No pathologically enlarged lymph nodes. Reproductive: Brachytherapy seeds in the prostate. Other: No free fluid.  Mesenteries and peritoneum are unremarkable. Musculoskeletal: Degenerative changes in the spine. Osteopenia. Minimal grade 1 anterolisthesis of L4 on L5 and L5 on S1. Old T12 and L1 compression fractures. IMPRESSION: 1. Multifocal consolidation in the lung bases, indicative of pneumonia. Organizing pneumonia is not excluded. Please correlate clinically. Findings are only partially imaged. Consider CT chest without contrast in further evaluation, as clinically indicated. 2. Bilateral renal stones and tiny bladder stones. Double-J left ureteral stent in place with minimal prominence of the left ureter, unchanged. Previously seen mid left ureteral stone is no longer visualized. 3.  Aortic atherosclerosis (ICD10-I70.0). Electronically Signed   By: Newell Eke M.D.    On: 03/07/2024 14:37   DG Chest Port 1 View Result Date: 03/07/2024 CLINICAL DATA:  Worsening weakness over the past several days. Possible sepsis. EXAM: PORTABLE CHEST 1 VIEW COMPARISON:  02/18/2024 FINDINGS: Lungs are hypoinflated with patchy bilateral airspace opacification likely multifocal pneumonia as this is slightly worse. Possible small amount left pleural fluid unchanged. Cardiomediastinal silhouette and remainder of the exam is unchanged. IMPRESSION: Slight worsening patchy bilateral airspace process likely multifocal pneumonia. Possible small amount left pleural fluid unchanged. Electronically Signed   By: Toribio Agreste M.D.   On: 03/07/2024 13:19    Chlorhexidine  Gluconate Cloth  6 each Topical Daily   enoxaparin  (LOVENOX ) injection  30 mg Subcutaneous Q24H   insulin  aspart  0-9 Units Subcutaneous TID WC   linezolid   600 mg Oral Q12H   nystatin   5 mL Oral QID    BMET    Component Value Date/Time   NA 137 03/09/2024 0420   K 3.9 03/09/2024 0420   CL 98 03/09/2024 0420   CO2 25 03/09/2024 0420   GLUCOSE 102 (H) 03/09/2024 0420   BUN 78 (H) 03/09/2024 0420   BUN 20 06/29/2022 1354   CREATININE 5.67 (H) 03/09/2024 0420   CALCIUM  9.8 03/09/2024 0420   GFRNONAA 9 (L) 03/09/2024 0420   GFRAA >60 05/04/2017 0525   CBC    Component Value Date/Time   WBC 5.5 03/09/2024 0420   RBC 3.37 (L) 03/09/2024 0420   HGB 9.5 (L) 03/09/2024 0420   HCT 30.2 (L) 03/09/2024 0420   PLT 424 (H) 03/09/2024 0420   MCV 89.6 03/09/2024 0420   MCH 28.2 03/09/2024 0420   MCHC 31.5 03/09/2024 0420  RDW 15.3 03/09/2024 0420   LYMPHSABS 0.9 03/07/2024 1216   MONOABS 1.0 03/07/2024 1216   EOSABS 0.6 (H) 03/07/2024 1216   BASOSABS 0.0 03/07/2024 1216    Assessment/Plan:  AKI/CKD stage IIIb - in setting of poor po intake for more than a week.  Likely pre-renal insult but he has also been on antibiotics for several weeks so AIN is also on the differential diagnosis.  CT stone protocol with  proper placement of left JJ ureteral stent, no hydronephrosis.  Started on isotonic bicarb with improvement of BUN/Cr.  Agree with continuing IVF's and now on lactated ringers  with continued improvement of Scr.  Continue to follow UOP and Scr closely.  No indications for dialysis at this time. Avoid nephrotoxic medications including NSAIDs and iodinated intravenous contrast exposure unless the latter is absolutely indicated.   Preferred narcotic agents for pain control are hydromorphone, fentanyl , and methadone. Morphine  should not be used.  Avoid Baclofen and avoid oral sodium phosphate  and magnesium  citrate based laxatives / bowel preps.  Continue strict Input and Output monitoring.  Will monitor the patient closely with you and intervene or adjust therapy as indicated by changes in clinical status/labs  AGMA - due to #1.  Improved with isotonic bicarb.  Ok to change to IV lactated ringers  or NS and follow Multifocal pneumonia - per primary svc and ID Generalized weakness - likely due to #1 and poor po intake for 7-10 days.  Will need PT/OT eval Bilateral nephrolithiasis - s/p left JJ ureteral stent on 03/01/24 and to have it removed on 03/12/24 HTN - stable Anemia of chronic disease MRSE bacteremia - PICC line out and had been on IV daptomycin .  Per ID, zyvox  600 mg bid through 03/20/24 Dm type 2 - per primary  Fairy RONAL Sellar, MD [facility]  The patient will not be physically seen over the weekend, however the chart will be reviewed remotely and on call coverage available if needed.

## 2024-03-10 DIAGNOSIS — N179 Acute kidney failure, unspecified: Secondary | ICD-10-CM | POA: Diagnosis not present

## 2024-03-10 DIAGNOSIS — R531 Weakness: Secondary | ICD-10-CM | POA: Diagnosis not present

## 2024-03-10 DIAGNOSIS — B957 Other staphylococcus as the cause of diseases classified elsewhere: Secondary | ICD-10-CM | POA: Diagnosis not present

## 2024-03-10 DIAGNOSIS — E872 Acidosis, unspecified: Secondary | ICD-10-CM | POA: Diagnosis not present

## 2024-03-10 DIAGNOSIS — E119 Type 2 diabetes mellitus without complications: Secondary | ICD-10-CM | POA: Diagnosis not present

## 2024-03-10 DIAGNOSIS — Z1629 Resistance to other single specified antibiotic: Secondary | ICD-10-CM | POA: Diagnosis not present

## 2024-03-10 DIAGNOSIS — E785 Hyperlipidemia, unspecified: Secondary | ICD-10-CM | POA: Diagnosis not present

## 2024-03-10 DIAGNOSIS — R7881 Bacteremia: Secondary | ICD-10-CM | POA: Diagnosis not present

## 2024-03-10 DIAGNOSIS — N200 Calculus of kidney: Secondary | ICD-10-CM | POA: Diagnosis not present

## 2024-03-10 LAB — CBC
HCT: 29.2 % — ABNORMAL LOW (ref 39.0–52.0)
Hemoglobin: 9.1 g/dL — ABNORMAL LOW (ref 13.0–17.0)
MCH: 28.7 pg (ref 26.0–34.0)
MCHC: 31.2 g/dL (ref 30.0–36.0)
MCV: 92.1 fL (ref 80.0–100.0)
Platelets: 418 K/uL — ABNORMAL HIGH (ref 150–400)
RBC: 3.17 MIL/uL — ABNORMAL LOW (ref 4.22–5.81)
RDW: 15.4 % (ref 11.5–15.5)
WBC: 5.8 K/uL (ref 4.0–10.5)
nRBC: 0 % (ref 0.0–0.2)

## 2024-03-10 LAB — GLUCOSE, CAPILLARY
Glucose-Capillary: 117 mg/dL — ABNORMAL HIGH (ref 70–99)
Glucose-Capillary: 111 mg/dL — ABNORMAL HIGH (ref 70–99)
Glucose-Capillary: 105 mg/dL — ABNORMAL HIGH (ref 70–99)
Glucose-Capillary: 122 mg/dL — ABNORMAL HIGH (ref 70–99)
Glucose-Capillary: 118 mg/dL — ABNORMAL HIGH (ref 70–99)

## 2024-03-10 LAB — BASIC METABOLIC PANEL WITH GFR
Anion gap: 10 (ref 5–15)
BUN: 71 mg/dL — ABNORMAL HIGH (ref 8–23)
CO2: 26 mmol/L (ref 22–32)
Calcium: 9.8 mg/dL (ref 8.9–10.3)
Chloride: 102 mmol/L (ref 98–111)
Creatinine, Ser: 5.13 mg/dL — ABNORMAL HIGH (ref 0.61–1.24)
GFR, Estimated: 11 mL/min — ABNORMAL LOW
Glucose, Bld: 121 mg/dL — ABNORMAL HIGH (ref 70–99)
Potassium: 4.2 mmol/L (ref 3.5–5.1)
Sodium: 138 mmol/L (ref 135–145)

## 2024-03-10 LAB — PHOSPHORUS: Phosphorus: 4.4 mg/dL (ref 2.5–4.6)

## 2024-03-10 MED ORDER — SODIUM CHLORIDE 0.9 % IV SOLN: INTRAVENOUS | Status: AC

## 2024-03-10 MED ORDER — TAMSULOSIN HCL 0.4 MG PO CAPS
0.4000 mg | ORAL_CAPSULE | Freq: Every day | ORAL | Status: DC
  Administered 2024-03-10 – 2024-03-17 (×8): 0.4 mg via ORAL
  Filled 2024-03-10 (×8): qty 1

## 2024-03-10 MED ORDER — DIPHENHYDRAMINE HCL 25 MG PO CAPS
25.0000 mg | ORAL_CAPSULE | Freq: Three times a day (TID) | ORAL | Status: DC | PRN
  Administered 2024-03-10 – 2024-03-15 (×4): 25 mg via ORAL
  Filled 2024-03-10 (×4): qty 1

## 2024-03-10 MED ORDER — FLUCONAZOLE 100 MG PO TABS
100.0000 mg | ORAL_TABLET | Freq: Once | ORAL | Status: AC
  Administered 2024-03-10: 100 mg via ORAL
  Filled 2024-03-10: qty 1

## 2024-03-10 NOTE — Progress Notes (Signed)
 " PROGRESS NOTE   Marcus Simpson  FMW:[id number] DOB: 1945/09/26 DOA: 03/07/2024 PCP: [name], MD   Chief Complaint  Patient presents with   Weakness   Level of care: Med-Surg  Brief Admission History:  78 year old male with complex past medical history including type 2 diabetes mellitus, hypertension, anemia of chronic disease, stage IIIb CKD, history of prostate cancer, gout, history of left acute pyelonephritis, left ureteral stone status post left ureteral stent placement by Dr. Sherrilee, MRSA bacteremia, Pseudomonas bacteremia, recently discharged with a PICC line and IV daptomycin  for presumed IE since patient refused TEE.  TTE was negative for vegetation.  Unfortunately patient reports that for the past several days he has been feeling unwell with progressive weakness.  He also reports having fever at home.  He pulled his right PICC line out and refused further IV antibiotics.  Home health nursing called infectious disease doctor who recommended that he take Zyvox  600 mg twice daily through end date of 03/20/2024 which was his original end date.  Patient and wife strongly felt that the treatments he was receiving was causing his progressive weakness and debility.  He has had poor oral intake over the past several days, poor appetite and reports that he is supposed to have his left ureteral stent removed by Dr. Sherrilee on Monday, 03/12/2024.  On arrival he was noted to be clinically dehydrated and ill-appearing.  His creatinine had risen to 6.87, up from 1.87 on 02/22/2024.  His hemoglobin is stable at 10.9.  His lactic acid was 2.0.  His CO2 was 16.  His AST was 59, ALT 13, GFR estimated at 8 mL/min.  His urinalysis was positive for hemoglobin, moderate leukocytes protein, rare bacteria 21-50 WBC per high-power field.  CT stone study suggesting consolidation in the lung bases, bilateral renal stones and bladder stones, left ureteral stone in place than previously seen left ureteral stone no  longer present.  Patient was given IV fluids and admission requested for AKI management.   Assessment and Plan:  AKI on CKD stage 3b -- hopefully from poor oral intake and this is prerenal -- hopefully his renal function will improve with IV fluid hydration -- sodium bicarbonate  infusion completed with resolution of metabolic acidosis -- remains on IV NS -- follow urine output  -- follow daily renal function labs--creatinine plateaued at 5.13 today -- reporting difficulty urinating, foley cath ordered with improved urine flow    Intake/Output Summary (Last 24 hours) at 03/10/2024 1410 Last data filed at 03/10/2024 0414 Gross per 24 hour  Intake 506.22 ml  Output 900 ml  Net -393.78 ml   Generalized Weakness -- suspect this is all precipitated from the AKI  -- treating supportively for now   Bilateral nephrolithiasis -- no signs of obstruction at this time -- pt is due to have ureteral stent removed by Dr. Sherrilee on 03/12/24 -- will notify Dr. Sherrilee of admission on Mon  Aspiration pneumonia -- ordered CT chest without contrast for further investigation and work up -- CT confirms multifocal pneumonia  -- complete course of augmentin    Oral Candidiasis -- fluconazole  ordered -- continue nystatin  swish   Essential hypertension  -- stable, follow -- resume home meds as able    MRSE bacteremia -- pt had a PICC line and was on IV daptomycin  until 12/17 when he removed PICC line and refused further IV antibiotics -- the home heath RN  had contacted ID physician and the recommendation was to start zyvox  600  mg BID to complete course of treatment thru 03/20/24   Anemia of chronic disease -- Hg stable from recent testing around 10 -- Hg may trend down from hemodilution with IV fluids ordered   Metabolic Acidosis--resolved  -- secondary to worsening renal failure  -- sodium bicarbonate  IV infusion completed    Bacteremia due to pseudomonas-completed treatment  -- pt  reports he completed the course of antibiotics for this    Type 2 DM, controlled with renal complications -- SSI coverage with frequent CBG monitoring ordered CBG (last 3)  Recent Labs    03/10/24 0436 03/10/24 0712 03/10/24 1120  GLUCAP 117* 111* 105*    DVT prophylaxis: enoxaparin  Code Status: Full  Family Communication: wife at bedside updated 12/17-18 Disposition:   Consultants:  Nephrology  Procedures:   Antimicrobials:  Zyvox  >>  Augmentin    Subjective: Still having sore mouth and throat, tolerating liquid diet better    Objective: Vitals:   03/09/24 0526 03/09/24 1256 03/09/24 1940 03/10/24 0517  BP: (!) 147/71 134/85 (!) 167/73 129/76  Pulse: 100 100 92 88  Resp: 20 19 18 20   Temp: 98.3 F (36.8 C) 98.2 F (36.8 C) 97.7 F (36.5 C) 97.7 F (36.5 C)  TempSrc: Oral  Oral Oral  SpO2: 92% 91% 95% 97%  Weight: 97.4 kg   98.1 kg  Height:        Intake/Output Summary (Last 24 hours) at 03/10/2024 1406 Last data filed at 03/10/2024 0414 Gross per 24 hour  Intake 506.22 ml  Output 900 ml  Net -393.78 ml   Filed Weights   03/08/24 0403 03/09/24 0526 03/10/24 0517  Weight: 97.4 kg 97.4 kg 98.1 kg   Examination:  General exam: Appears calm and comfortable Oral: yeast lesions seen in mouth   Respiratory system: Clear to auscultation. Respiratory effort normal. Cardiovascular system: normal S1 & S2 heard. No JVD, murmurs, rubs, gallops or clicks. No pedal edema. Gastrointestinal system: Abdomen is nondistended, soft and nontender. No organomegaly or masses felt. Normal bowel sounds heard. Central nervous system: Alert and oriented. No focal neurological deficits. Extremities: Symmetric 5 x 5 power. Skin: No rashes, lesions or ulcers. Psychiatry: Judgement and insight appear normal. Mood & affect appropriate.   Data Reviewed: I have personally reviewed following labs and imaging studies  CBC: Recent Labs  Lab 03/07/24 1216 03/08/24 0444  03/09/24 0420 03/10/24 0541  WBC 9.1 7.3 5.5 5.8  NEUTROABS 6.4  --   --   --   HGB 10.9* 10.0* 9.5* 9.1*  HCT 34.0* 31.4* 30.2* 29.2*  MCV 89.5 89.2 89.6 92.1  PLT 497* 448* 424* 418*    Basic Metabolic Panel: Recent Labs  Lab 03/07/24 1216 03/08/24 0444 03/09/24 0420 03/10/24 0541  NA 137 137 137 138  K 4.2 4.7 3.9 4.2  CL 100 97* 98 102  CO2 16* 25 25 26   GLUCOSE 97 99 102* 121*  BUN 84* 82* 78* 71*  CREATININE 6.87* 6.24* 5.67* 5.13*  CALCIUM  10.2 9.8 9.8 9.8  MG  --  2.1  --   --   PHOS  --  5.1* 4.8* 4.4    CBG: Recent Labs  Lab 03/09/24 1623 03/09/24 2010 03/10/24 0436 03/10/24 0712 03/10/24 1120  GLUCAP 108* 143* 117* 111* 105*    Recent Results (from the past 240 hours)  Blood culture (routine x 2)     Status: None (Preliminary result)   Collection Time: 03/07/24 12:16 PM   Specimen: BLOOD  Result Value  Ref Range Status   Specimen Description BLOOD LEFT ANTECUBITAL  Final   Special Requests   Final    BOTTLES DRAWN AEROBIC AND ANAEROBIC Blood Culture adequate volume   Culture   Final    NO GROWTH 3 DAYS Performed at [facility], [address], [place], [place] [address]    Report Status PENDING  Incomplete  Blood culture (routine x 2)     Status: None (Preliminary result)   Collection Time: 03/07/24 12:30 PM   Specimen: BLOOD RIGHT ARM  Result Value Ref Range Status   Specimen Description BLOOD RIGHT ARM  Final   Special Requests   Final    BOTTLES DRAWN AEROBIC ONLY Blood Culture adequate volume   Culture   Final    NO GROWTH 3 DAYS Performed at [facility], [address], [place], [place] [address]    Report Status PENDING  Incomplete  Urine Culture     Status: None   Collection Time: 03/07/24 12:46 PM   Specimen: Urine, Random  Result Value Ref Range Status   Specimen Description   Final    URINE, RANDOM Performed at [facility], [address] [place], [place] [address]    Special Requests   Final    NONE Reflexed from  [id number] Performed at [facility], [address] [place], [place] [address]    Culture   Final    NO GROWTH Performed at [facility], [address], [place], [place] [address]    Report Status 03/08/2024 FINAL  Final     Radiology Studies: CT CHEST WO CONTRAST Result Date: 03/08/2024 EXAM: CT CHEST WITHOUT CONTRAST 03/08/2024 03:22:11 PM TECHNIQUE: CT of the chest was performed without the administration of intravenous contrast. Multiplanar reformatted images are provided for review. Automated exposure control, iterative reconstruction, and/or weight based adjustment of the mA/kV was utilized to reduce the radiation dose to as low as reasonably achievable. COMPARISON: CT and x-ray from 03/07/2024 and CT chest 10/30/2023. CLINICAL HISTORY: question of pneumonia (multifocal) FINDINGS: MEDIASTINUM: Heart and pericardium are unremarkable. The central airways are clear. LYMPH NODES: No mediastinal, hilar or axillary lymphadenopathy. LUNGS AND PLEURA: Peribronchovascular and peripheral consolidative opacities diffusely throughout both lungs. No pleural effusion. No pneumothorax. SOFT TISSUES/BONES: No acute abnormality of the bones or soft tissues. UPPER ABDOMEN: Limited images of the upper abdomen demonstrate bilateral nonobstructing nephrolithiasis. IMPRESSION: 1. Peribronchovascular and peripheral consolidative opacities diffusely throughout both lungs, most consistent with multifocal pneumonia; differential includes but is not limited to: organizing pneumonia, aspiration pneumonitis, and drug/toxin-related pneumonitis. Prominent Electronically signed by: Norman Gatlin MD 03/08/2024 08:04 PM EST RP Workstation: [id number]    Scheduled Meds:  amoxicillin -clavulanate  1 tablet Oral BID   Chlorhexidine  Gluconate Cloth  6 each Topical Daily   enoxaparin  (LOVENOX ) injection  30 mg Subcutaneous Q24H   insulin  aspart  0-9 Units Subcutaneous TID WC   linezolid   600 mg Oral Q12H   nystatin   5 mL  Oral QID   saccharomyces boulardii  250 mg Oral BID   tamsulosin   0.4 mg Oral QPC breakfast   Continuous Infusions:  sodium chloride  40 mL/hr at 03/10/24 0837     LOS: 3 days   Time spent: 55 mins  Aysia Lowder Vicci, MD How to contact the [facility] Attending or Consulting provider 7A - 7P or covering provider during after hours 7P -7A, for this patient?  Check the care team in [facility] and look for a) attending/consulting TRH provider listed and b) the TRH  team listed Log into [email or web address] to find provider on call.  Locate the TRH provider you are looking for under [facility] and page to a number that you can be directly reached. If you still have difficulty reaching the provider, please page the [facility] (Director on Call) for the Hospitalists listed on amion for assistance.  03/10/2024, 2:06 PM    "

## 2024-03-10 NOTE — Plan of Care (Signed)

## 2024-03-11 DIAGNOSIS — R531 Weakness: Secondary | ICD-10-CM | POA: Diagnosis not present

## 2024-03-11 DIAGNOSIS — N179 Acute kidney failure, unspecified: Secondary | ICD-10-CM | POA: Diagnosis not present

## 2024-03-11 DIAGNOSIS — N200 Calculus of kidney: Secondary | ICD-10-CM | POA: Diagnosis not present

## 2024-03-11 DIAGNOSIS — E785 Hyperlipidemia, unspecified: Secondary | ICD-10-CM | POA: Diagnosis not present

## 2024-03-11 LAB — CBC
HCT: 29.4 % — ABNORMAL LOW (ref 39.0–52.0)
Hemoglobin: 9.2 g/dL — ABNORMAL LOW (ref 13.0–17.0)
MCH: 28.7 pg (ref 26.0–34.0)
MCHC: 31.3 g/dL (ref 30.0–36.0)
MCV: 91.6 fL (ref 80.0–100.0)
Platelets: 439 K/uL — ABNORMAL HIGH (ref 150–400)
RBC: 3.21 MIL/uL — ABNORMAL LOW (ref 4.22–5.81)
RDW: 15.4 % (ref 11.5–15.5)
WBC: 7.6 K/uL (ref 4.0–10.5)
nRBC: 0 % (ref 0.0–0.2)

## 2024-03-11 LAB — BASIC METABOLIC PANEL WITH GFR
Anion gap: 12 (ref 5–15)
BUN: 59 mg/dL — ABNORMAL HIGH (ref 8–23)
CO2: 24 mmol/L (ref 22–32)
Calcium: 10 mg/dL (ref 8.9–10.3)
Chloride: 101 mmol/L (ref 98–111)
Creatinine, Ser: 4.55 mg/dL — ABNORMAL HIGH (ref 0.61–1.24)
GFR, Estimated: 12 mL/min — ABNORMAL LOW
Glucose, Bld: 126 mg/dL — ABNORMAL HIGH (ref 70–99)
Potassium: 3.9 mmol/L (ref 3.5–5.1)
Sodium: 137 mmol/L (ref 135–145)

## 2024-03-11 LAB — GLUCOSE, CAPILLARY
Glucose-Capillary: 118 mg/dL — ABNORMAL HIGH (ref 70–99)
Glucose-Capillary: 117 mg/dL — ABNORMAL HIGH (ref 70–99)
Glucose-Capillary: 130 mg/dL — ABNORMAL HIGH (ref 70–99)
Glucose-Capillary: 152 mg/dL — ABNORMAL HIGH (ref 70–99)
Glucose-Capillary: 149 mg/dL — ABNORMAL HIGH (ref 70–99)

## 2024-03-11 MED ORDER — SODIUM CHLORIDE 0.9 % IV SOLN: INTRAVENOUS | Status: DC

## 2024-03-11 NOTE — Plan of Care (Signed)

## 2024-03-11 NOTE — Progress Notes (Signed)
 " PROGRESS NOTE   Marcus Simpson  FMW:[id number] DOB: August 31, 1945 DOA: 03/07/2024 PCP: [name], MD   Chief Complaint  Patient presents with   Weakness   Level of care: Med-Surg  Brief Admission History:  78 year old male with complex past medical history including type 2 diabetes mellitus, hypertension, anemia of chronic disease, stage IIIb CKD, history of prostate cancer, gout, history of left acute pyelonephritis, left ureteral stone status post left ureteral stent placement by Dr. Sherrilee, MRSA bacteremia, Pseudomonas bacteremia, recently discharged with a PICC line and IV daptomycin  for presumed IE since patient refused TEE.  TTE was negative for vegetation.  Unfortunately patient reports that for the past several days he has been feeling unwell with progressive weakness.  He also reports having fever at home.  He pulled his right PICC line out and refused further IV antibiotics.  Home health nursing called infectious disease doctor who recommended that he take Zyvox  600 mg twice daily through end date of 03/20/2024 which was his original end date.  Patient and wife strongly felt that the treatments he was receiving was causing his progressive weakness and debility.  He has had poor oral intake over the past several days, poor appetite and reports that he is supposed to have his left ureteral stent removed by Dr. Sherrilee on Monday, 03/12/2024.  On arrival he was noted to be clinically dehydrated and ill-appearing.  His creatinine had risen to 6.87, up from 1.87 on 02/22/2024.  His hemoglobin is stable at 10.9.  His lactic acid was 2.0.  His CO2 was 16.  His AST was 59, ALT 13, GFR estimated at 8 mL/min.  His urinalysis was positive for hemoglobin, moderate leukocytes protein, rare bacteria 21-50 WBC per high-power field.  CT stone study suggesting consolidation in the lung bases, bilateral renal stones and bladder stones, left ureteral stone in place than previously seen left ureteral stone no  longer present.  Patient was given IV fluids and admission requested for AKI management.   Assessment and Plan:  AKI on CKD stage 3b -- hopefully from poor oral intake and this is prerenal -- hopefully his renal function will improve with IV fluid hydration -- sodium bicarbonate  infusion completed with resolution of metabolic acidosis -- remains on IV NS -- follow urine output  -- follow daily renal function labs--creatinine improved to 4.55  -- reported difficulty urinating, foley cath ordered with improved urine flow    Intake/Output Summary (Last 24 hours) at 03/11/2024 1739 Last data filed at 03/11/2024 1250 Gross per 24 hour  Intake 1099.45 ml  Output 1700 ml  Net -600.55 ml   Generalized Weakness -- suspect this is all precipitated from the AKI  -- treating supportively for now   Bilateral nephrolithiasis -- no signs of obstruction at this time -- pt is due to have ureteral stent removed by Dr. Sherrilee on 03/12/24 -- will notify Dr. Sherrilee of admission on Mon  Aspiration pneumonia -- ordered CT chest without contrast for further investigation and work up -- CT confirms multifocal pneumonia  -- complete course of augmentin    Oral Candidiasis -- fluconazole  ordered -- continue nystatin  swish   Essential hypertension  -- stable, follow -- resume home meds as able    MRSE bacteremia -- pt had a PICC line and was on IV daptomycin  until 12/17 when he removed PICC line and refused further IV antibiotics -- the home heath RN  had contacted ID physician and the recommendation was to start zyvox  600  mg BID to complete course of treatment thru 03/20/24   Anemia of chronic disease -- Hg stable from recent testing around 10 -- Hg may trend down from hemodilution with IV fluids ordered   Metabolic Acidosis--resolved  -- secondary to worsening renal failure  -- sodium bicarbonate  IV infusion completed    Bacteremia due to pseudomonas-completed treatment  -- pt reports  he completed the course of antibiotics for this    Type 2 DM, controlled with renal complications -- SSI coverage with frequent CBG monitoring ordered CBG (last 3)  Recent Labs    03/11/24 0725 03/11/24 1108 03/11/24 1614  GLUCAP 117* 130* 152*    DVT prophylaxis: enoxaparin  Code Status: Full  Family Communication: wife at bedside updated 12/17-18 Disposition:   Consultants:  Nephrology  Procedures:   Antimicrobials:  Zyvox  >>  Augmentin    Subjective: Pt reported less painful sore mouth and wants to try to advance diet to soft foods    Objective: Vitals:   03/10/24 1932 03/11/24 0337 03/11/24 0442 03/11/24 1247  BP: (!) 153/68 (!) 151/75  (!) 157/67  Pulse: 99 (!) 101  97  Resp: 17 18  18   Temp: 99 F (37.2 C) 98.3 F (36.8 C)  98.6 F (37 C)  TempSrc: Oral Oral  Oral  SpO2: 98% 92%  92%  Weight:   98.3 kg   Height:        Intake/Output Summary (Last 24 hours) at 03/11/2024 1739 Last data filed at 03/11/2024 1250 Gross per 24 hour  Intake 1099.45 ml  Output 1700 ml  Net -600.55 ml   Filed Weights   03/09/24 0526 03/10/24 0517 03/11/24 0442  Weight: 97.4 kg 98.1 kg 98.3 kg   Examination:  General exam: Appears calm and comfortable Oral: yeast lesions seen in mouth nearly resolved Respiratory system: Clear to auscultation. Respiratory effort normal. Cardiovascular system: normal S1 & S2 heard. No JVD, murmurs, rubs, gallops or clicks. No pedal edema. Gastrointestinal system: Abdomen is nondistended, soft and nontender. No organomegaly or masses felt. Normal bowel sounds heard. Central nervous system: Alert and oriented. No focal neurological deficits. Extremities: Symmetric 5 x 5 power. Skin: No rashes, lesions or ulcers. Psychiatry: Judgement and insight appear normal. Mood & affect appropriate.   Data Reviewed: I have personally reviewed following labs and imaging studies  CBC: Recent Labs  Lab 03/07/24 1216 03/08/24 0444 03/09/24 0420  03/10/24 0541 03/11/24 0424  WBC 9.1 7.3 5.5 5.8 7.6  NEUTROABS 6.4  --   --   --   --   HGB 10.9* 10.0* 9.5* 9.1* 9.2*  HCT 34.0* 31.4* 30.2* 29.2* 29.4*  MCV 89.5 89.2 89.6 92.1 91.6  PLT 497* 448* 424* 418* 439*    Basic Metabolic Panel: Recent Labs  Lab 03/07/24 1216 03/08/24 0444 03/09/24 0420 03/10/24 0541 03/11/24 0424  NA 137 137 137 138 137  K 4.2 4.7 3.9 4.2 3.9  CL 100 97* 98 102 101  CO2 16* 25 25 26 24   GLUCOSE 97 99 102* 121* 126*  BUN 84* 82* 78* 71* 59*  CREATININE 6.87* 6.24* 5.67* 5.13* 4.55*  CALCIUM  10.2 9.8 9.8 9.8 10.0  MG  --  2.1  --   --   --   PHOS  --  5.1* 4.8* 4.4  --     CBG: Recent Labs  Lab 03/10/24 2125 03/11/24 0337 03/11/24 0725 03/11/24 1108 03/11/24 1614  GLUCAP 118* 118* 117* 130* 152*    Recent Results (from the past  240 hours)  Blood culture (routine x 2)     Status: None (Preliminary result)   Collection Time: 03/07/24 12:16 PM   Specimen: BLOOD  Result Value Ref Range Status   Specimen Description BLOOD LEFT ANTECUBITAL  Final   Special Requests   Final    BOTTLES DRAWN AEROBIC AND ANAEROBIC Blood Culture adequate volume   Culture   Final    NO GROWTH 4 DAYS Performed at [facility], [address] [place], [place] [address]    Report Status PENDING  Incomplete  Blood culture (routine x 2)     Status: None (Preliminary result)   Collection Time: 03/07/24 12:30 PM   Specimen: BLOOD RIGHT ARM  Result Value Ref Range Status   Specimen Description BLOOD RIGHT ARM  Final   Special Requests   Final    BOTTLES DRAWN AEROBIC ONLY Blood Culture adequate volume   Culture   Final    NO GROWTH 4 DAYS Performed at [facility], [address] [place], [place] [address]    Report Status PENDING  Incomplete  Urine Culture     Status: None   Collection Time: 03/07/24 12:46 PM   Specimen: Urine, Random  Result Value Ref Range Status   Specimen Description   Final    URINE, RANDOM Performed at [facility], [address] [place], [place] [address]    Special Requests   Final    NONE Reflexed from [id number] Performed at [facility], [address] [place], [place] [address]    Culture   Final    NO GROWTH Performed at [facility], [address] [place], [place] [address]    Report Status 03/08/2024 FINAL  Final     Radiology Studies: No results found.   Scheduled Meds:  amoxicillin -clavulanate  1 tablet Oral BID   Chlorhexidine  Gluconate Cloth  6 each Topical Daily   enoxaparin  (LOVENOX ) injection  30 mg Subcutaneous Q24H   insulin  aspart  0-9 Units Subcutaneous TID WC   linezolid   600 mg Oral Q12H   nystatin   5 mL Oral QID   saccharomyces boulardii  250 mg Oral BID   tamsulosin   0.4 mg Oral QPC breakfast   Continuous Infusions:   LOS: 4 days   Time spent: 55 mins  Citlalic Norlander Vicci, MD How to contact the [facility] Attending or Consulting provider 7A - 7P or covering provider during after hours 7P -7A, for this patient?  Check the care team in [facility] and look for a) attending/consulting TRH provider listed and b) the TRH team listed Log into [email or web address] to find provider on call.  Locate the TRH provider you are looking for under [facility] and page to a number that you can be directly reached. If you still have difficulty reaching the provider, please page the [facility] (Director on Call) for the Hospitalists listed on amion for assistance.  03/11/2024, 5:39 PM    "

## 2024-03-11 NOTE — Progress Notes (Signed)
 Brief note  Labs reviewed yesterday and today. Renal function continues to improve. Will be seen tomorrow.  Ephriam Stank, MD [facility]

## 2024-03-11 NOTE — Plan of Care (Signed)
" °  Problem: Clinical Measurements: Goal: Will remain free from infection Outcome: Not Progressing   Problem: Clinical Measurements: Goal: Cardiovascular complication will be avoided Outcome: Not Progressing   Problem: Clinical Measurements: Goal: Respiratory complications will improve Outcome: Not Progressing   Problem: Nutrition: Goal: Adequate nutrition will be maintained Outcome: Not Progressing   Problem: Elimination: Goal: Will not experience complications related to urinary retention Outcome: Not Progressing   Problem: Safety: Goal: Ability to remain free from injury will improve Outcome: Not Progressing   "

## 2024-03-12 ENCOUNTER — Ambulatory Visit: Admitting: Urology

## 2024-03-12 DIAGNOSIS — N200 Calculus of kidney: Secondary | ICD-10-CM | POA: Diagnosis not present

## 2024-03-12 DIAGNOSIS — E119 Type 2 diabetes mellitus without complications: Secondary | ICD-10-CM | POA: Diagnosis not present

## 2024-03-12 DIAGNOSIS — Z1629 Resistance to other single specified antibiotic: Secondary | ICD-10-CM | POA: Diagnosis not present

## 2024-03-12 DIAGNOSIS — E872 Acidosis, unspecified: Secondary | ICD-10-CM | POA: Diagnosis not present

## 2024-03-12 DIAGNOSIS — N179 Acute kidney failure, unspecified: Secondary | ICD-10-CM | POA: Diagnosis not present

## 2024-03-12 DIAGNOSIS — E785 Hyperlipidemia, unspecified: Secondary | ICD-10-CM | POA: Diagnosis not present

## 2024-03-12 DIAGNOSIS — R7881 Bacteremia: Secondary | ICD-10-CM | POA: Diagnosis not present

## 2024-03-12 DIAGNOSIS — R531 Weakness: Secondary | ICD-10-CM | POA: Diagnosis not present

## 2024-03-12 DIAGNOSIS — B957 Other staphylococcus as the cause of diseases classified elsewhere: Secondary | ICD-10-CM | POA: Diagnosis not present

## 2024-03-12 LAB — CBC
HCT: 30.1 % — ABNORMAL LOW (ref 39.0–52.0)
Hemoglobin: 9.4 g/dL — ABNORMAL LOW (ref 13.0–17.0)
MCH: 28.6 pg (ref 26.0–34.0)
MCHC: 31.2 g/dL (ref 30.0–36.0)
MCV: 91.5 fL (ref 80.0–100.0)
Platelets: 500 K/uL — ABNORMAL HIGH (ref 150–400)
RBC: 3.29 MIL/uL — ABNORMAL LOW (ref 4.22–5.81)
RDW: 15.5 % (ref 11.5–15.5)
WBC: 8.4 K/uL (ref 4.0–10.5)
nRBC: 0.2 % (ref 0.0–0.2)

## 2024-03-12 LAB — GLUCOSE, CAPILLARY
Glucose-Capillary: 132 mg/dL — ABNORMAL HIGH (ref 70–99)
Glucose-Capillary: 124 mg/dL — ABNORMAL HIGH (ref 70–99)
Glucose-Capillary: 138 mg/dL — ABNORMAL HIGH (ref 70–99)
Glucose-Capillary: 106 mg/dL — ABNORMAL HIGH (ref 70–99)
Glucose-Capillary: 144 mg/dL — ABNORMAL HIGH (ref 70–99)

## 2024-03-12 LAB — CULTURE, BLOOD (ROUTINE X 2)
Culture: NO GROWTH
Culture: NO GROWTH
Special Requests: ADEQUATE
Special Requests: ADEQUATE

## 2024-03-12 LAB — BASIC METABOLIC PANEL WITH GFR
Anion gap: 12 (ref 5–15)
BUN: 57 mg/dL — ABNORMAL HIGH (ref 8–23)
CO2: 26 mmol/L (ref 22–32)
Calcium: 10.8 mg/dL — ABNORMAL HIGH (ref 8.9–10.3)
Chloride: 99 mmol/L (ref 98–111)
Creatinine, Ser: 4.22 mg/dL — ABNORMAL HIGH (ref 0.61–1.24)
GFR, Estimated: 14 mL/min — ABNORMAL LOW
Glucose, Bld: 138 mg/dL — ABNORMAL HIGH (ref 70–99)
Potassium: 4.1 mmol/L (ref 3.5–5.1)
Sodium: 136 mmol/L (ref 135–145)

## 2024-03-12 LAB — MAGNESIUM: Magnesium: 1.9 mg/dL (ref 1.7–2.4)

## 2024-03-12 LAB — PHOSPHORUS: Phosphorus: 4 mg/dL (ref 2.5–4.6)

## 2024-03-12 NOTE — Progress Notes (Signed)
 Mobility Specialist Progress Note:    03/12/24 1255  Mobility  Activity Pivoted/transferred to/from BSC  Level of Assistance Moderate assist, patient does 50-74% (+2)  Assistive Device None  Distance Ambulated (ft) 2 ft  Range of Motion/Exercises Active;All extremities  Activity Response Tolerated well  Mobility Referral Yes  Mobility visit 1 Mobility  Mobility Specialist Start Time (ACUTE ONLY) 1255  Mobility Specialist Stop Time (ACUTE ONLY) 1311  Mobility Specialist Time Calculation (min) (ACUTE ONLY) 16 min   Pt received in bathroom. NT requesting assistance with stand and pivot to chair from toilet. Required ModA +2 to stand and pivot with no AD. Tolerated well, wife and NT in room. Alarm on, all needs met.  Niyanna Asch Mobility Specialist Please contact via Special Educational Needs Teacher or  Rehab office at [phone]

## 2024-03-12 NOTE — Progress Notes (Signed)
 Patient ID: Marcus Simpson, male   DOB: May 09, 1945, 78 y.o.   MRN: [id number] S: No new complaints. O:BP (!) 157/77 (BP Location: Right Arm)   Pulse 97   Temp 98.4 F (36.9 C) (Oral)   Resp 19   Ht 5' 11 (1.803 m)   Wt 81.6 kg   SpO2 92%   BMI 25.09 kg/m   Intake/Output Summary (Last 24 hours) at 03/12/2024 9166 Last data filed at 03/12/2024 0500 Gross per 24 hour  Intake 960 ml  Output 1550 ml  Net -590 ml   Intake/Output: I/O last 3 completed shifts: In: 1579.5 [P.O.:1060; I.V.:519.5] Out: 2500 [Urine:2500]  Intake/Output this shift:  No intake/output data recorded. Weight change: -16.7 kg Gen: NAD CVS: RRR Resp: CTA Abd: +BS, soft, NT/ND Ext: trace to 1 + pretibial edema  Recent Labs  Lab 03/07/24 1216 03/08/24 0444 03/09/24 0420 03/10/24 0541 03/11/24 0424 03/12/24 0449  NA 137 137 137 138 137 136  K 4.2 4.7 3.9 4.2 3.9 4.1  CL 100 97* 98 102 101 99  CO2 16* 25 25 26 24 26   GLUCOSE 97 99 102* 121* 126* 138*  BUN 84* 82* 78* 71* 59* 57*  CREATININE 6.87* 6.24* 5.67* 5.13* 4.55* 4.22*  ALBUMIN 3.2* 3.0* 2.8*  --   --   --   CALCIUM  10.2 9.8 9.8 9.8 10.0 10.8*  PHOS  --  5.1* 4.8* 4.4  --  4.0  AST 59* 49* 40  --   --   --   ALT 13 12 12   --   --   --    Liver Function Tests: Recent Labs  Lab 03/07/24 1216 03/08/24 0444 03/09/24 0420  AST 59* 49* 40  ALT 13 12 12   ALKPHOS 135* 119 126  BILITOT 0.9 1.2 1.1  PROT 8.3* 7.4 7.0  ALBUMIN 3.2* 3.0* 2.8*   No results for input(s): LIPASE, AMYLASE in the last 168 hours. No results for input(s): AMMONIA in the last 168 hours. CBC: Recent Labs  Lab 03/07/24 1216 03/08/24 0444 03/09/24 0420 03/10/24 0541 03/11/24 0424 03/12/24 0449  WBC 9.1 7.3 5.5 5.8 7.6 8.4  NEUTROABS 6.4  --   --   --   --   --   HGB 10.9* 10.0* 9.5* 9.1* 9.2* 9.4*  HCT 34.0* 31.4* 30.2* 29.2* 29.4* 30.1*  MCV 89.5 89.2 89.6 92.1 91.6 91.5  PLT 497* 448* 424* 418* 439* 500*   Cardiac Enzymes: No results for  input(s): CKTOTAL, CKMB, CKMBINDEX, TROPONINI in the last 168 hours. CBG: Recent Labs  Lab 03/11/24 1108 03/11/24 1614 03/11/24 2017 03/12/24 0337 03/12/24 0727  GLUCAP 130* 152* 149* 132* 124*    Iron Studies: No results for input(s): IRON, TIBC, TRANSFERRIN, FERRITIN in the last 72 hours. Studies/Results: No results found.  amoxicillin -clavulanate  1 tablet Oral BID   Chlorhexidine  Gluconate Cloth  6 each Topical Daily   enoxaparin  (LOVENOX ) injection  30 mg Subcutaneous Q24H   insulin  aspart  0-9 Units Subcutaneous TID WC   linezolid   600 mg Oral Q12H   nystatin   5 mL Oral QID   saccharomyces boulardii  250 mg Oral BID   tamsulosin   0.4 mg Oral QPC breakfast    BMET    Component Value Date/Time   NA 136 03/12/2024 0449   K 4.1 03/12/2024 0449   CL 99 03/12/2024 0449   CO2 26 03/12/2024 0449   GLUCOSE 138 (H) 03/12/2024 0449   BUN 57 (H) 03/12/2024 0449  BUN 20 06/29/2022 1354   CREATININE 4.22 (H) 03/12/2024 0449   CALCIUM  10.8 (H) 03/12/2024 0449   GFRNONAA 14 (L) 03/12/2024 0449   GFRAA >60 05/04/2017 0525   CBC    Component Value Date/Time   WBC 8.4 03/12/2024 0449   RBC 3.29 (L) 03/12/2024 0449   HGB 9.4 (L) 03/12/2024 0449   HCT 30.1 (L) 03/12/2024 0449   PLT 500 (H) 03/12/2024 0449   MCV 91.5 03/12/2024 0449   MCH 28.6 03/12/2024 0449   MCHC 31.2 03/12/2024 0449   RDW 15.5 03/12/2024 0449   LYMPHSABS 0.9 03/07/2024 1216   MONOABS 1.0 03/07/2024 1216   EOSABS 0.6 (H) 03/07/2024 1216   BASOSABS 0.0 03/07/2024 1216   Assessment/Plan:  AKI/CKD stage IIIb - in setting of poor po intake for more than a week.  Likely pre-renal insult but he has also been on antibiotics for several weeks so AIN is also on the differential diagnosis.  CT stone protocol with proper placement of left JJ ureteral stent, no hydronephrosis.  Started on isotonic bicarb with improvement of BUN/Cr.  He has responded to IVF's but now has some edema.  Good UOP.  Ok to  stop IVF's and follow UOP and Scr closely.  No indications for dialysis at this time. Avoid nephrotoxic medications including NSAIDs and iodinated intravenous contrast exposure unless the latter is absolutely indicated.   Preferred narcotic agents for pain control are hydromorphone, fentanyl , and methadone. Morphine  should not be used.  Avoid Baclofen and avoid oral sodium phosphate  and magnesium  citrate based laxatives / bowel preps.  Continue strict Input and Output monitoring.  Will monitor the patient closely with you and intervene or adjust therapy as indicated by changes in clinical status/labs  AGMA - due to #1.  Improved with isotonic bicarb.  Ok to stop IVF's as above. Multifocal pneumonia - per primary svc and ID Generalized weakness - likely due to #1 and poor po intake for 7-10 days.  Will need PT/OT eval Bilateral nephrolithiasis - s/p left JJ ureteral stent on 03/01/24 and to have it removed on 03/12/24 HTN - stable Anemia of chronic disease MRSE bacteremia - PICC line out and had been on IV daptomycin .  Per ID, zyvox  600 mg bid through 03/20/24 Dm type 2 - per primary  Fairy RONAL Sellar, MD [facility]

## 2024-03-12 NOTE — Progress Notes (Signed)
 " PROGRESS NOTE   Marcus Simpson  FMW:[id number] DOB: Jul 26, 1945 DOA: 03/07/2024 PCP: [name], MD   Chief Complaint  Patient presents with   Weakness   Level of care: Med-Surg  Brief Admission History:  78 year old male with complex past medical history including type 2 diabetes mellitus, hypertension, anemia of chronic disease, stage IIIb CKD, history of prostate cancer, gout, history of left acute pyelonephritis, left ureteral stone status post left ureteral stent placement by Dr. Sherrilee, MRSA bacteremia, Pseudomonas bacteremia, recently discharged with a PICC line and IV daptomycin  for presumed IE since patient refused TEE.  TTE was negative for vegetation.  Unfortunately patient reports that for the past several days he has been feeling unwell with progressive weakness.  He also reports having fever at home.  He pulled his right PICC line out and refused further IV antibiotics.  Home health nursing called infectious disease doctor who recommended that he take Zyvox  600 mg twice daily through end date of 03/20/2024 which was his original end date.  Patient and wife strongly felt that the treatments he was receiving was causing his progressive weakness and debility.  He has had poor oral intake over the past several days, poor appetite and reports that he is supposed to have his left ureteral stent removed by Dr. Sherrilee on Monday, 03/12/2024.  On arrival he was noted to be clinically dehydrated and ill-appearing.  His creatinine had risen to 6.87, up from 1.87 on 02/22/2024.  His hemoglobin is stable at 10.9.  His lactic acid was 2.0.  His CO2 was 16.  His AST was 59, ALT 13, GFR estimated at 8 mL/min.  His urinalysis was positive for hemoglobin, moderate leukocytes protein, rare bacteria 21-50 WBC per high-power field.  CT stone study suggesting consolidation in the lung bases, bilateral renal stones and bladder stones, left ureteral stone in place than previously seen left ureteral stone no  longer present.  Patient was given IV fluids and admission requested for AKI management.   Assessment and Plan:  AKI on CKD stage 3b -- IMPROVING  -- from poor oral intake from to painful yeast stomatitis  -- improved with IV fluid hydration -- per nephrology ok to DC IVF today -- DC foley cath today and follow urine output and monitor bladder scans -- follow daily renal function labs--creatinine improved to 4.22    Intake/Output Summary (Last 24 hours) at 03/12/2024 1135 Last data filed at 03/12/2024 0900 Gross per 24 hour  Intake 1020 ml  Output 1550 ml  Net -530 ml   Generalized Weakness -- PT eval requested   -- treating supportively for now   Bilateral nephrolithiasis -- no signs of obstruction at this time -- pt was due to have ureteral stent removed by Dr. Sherrilee on 03/12/24 -- notified Dr. Sherrilee and he said that his office will schedule to have stent removed in the office   Aspiration pneumonia - IMPROVING -- ordered CT chest without contrast for further investigation and work up -- CT confirms multifocal pneumonia  -- complete course of augmentin    Oral Candidiasis--IMPROVING -- fluconazole  ordered and 2 doses given -- continue nystatin  swish  -- he reports much improved symptoms and eating / drinking better   Essential hypertension  -- stable, follow -- resume home meds     MRSE bacteremia -- pt had a PICC line and was on IV daptomycin  until 12/17 when he removed PICC line and refused further IV antibiotics -- the home heath RN  had contacted ID physician and the recommendation was to start zyvox  600 mg BID to complete course of treatment thru 03/20/24   Anemia of chronic disease -- Hg stable from recent testing around 10 -- Hg may trend down from hemodilution with IV fluids ordered   Metabolic Acidosis--resolved  -- secondary to worsening renal failure  -- sodium bicarbonate  IV infusion completed    Bacteremia due to pseudomonas-completed treatment   -- pt reports he completed the course of antibiotics for this    Type 2 DM, controlled with renal complications -- SSI coverage with frequent CBG monitoring ordered CBG (last 3)  Recent Labs    03/12/24 0337 03/12/24 0727 03/12/24 1117  GLUCAP 132* 124* 138*    DVT prophylaxis: enoxaparin  Code Status: Full  Family Communication: wife at bedside updated 12/17-18, son, daughter 12/21, 12/22 Disposition: [facility] with [facility]   Consultants:  Nephrology   Procedures:   Antimicrobials:  Zyvox  >>  Augmentin    Subjective: Pt reporting his mouth pain is much improved and he is eating and drinking much better.   Objective: Vitals:   03/11/24 1247 03/11/24 2015 03/12/24 0514 03/12/24 1106  BP: (!) 157/67 (!) 147/74 (!) 157/77   Pulse: 97 (!) 104 97   Resp: 18 19 19    Temp: 98.6 F (37 C) 98.3 F (36.8 C) 98.4 F (36.9 C)   TempSrc: Oral Oral Oral   SpO2: 92% 94% 92% 94%  Weight:   81.6 kg   Height:        Intake/Output Summary (Last 24 hours) at 03/12/2024 1135 Last data filed at 03/12/2024 0900 Gross per 24 hour  Intake 1020 ml  Output 1550 ml  Net -530 ml   Filed Weights   03/10/24 0517 03/11/24 0442 03/12/24 0514  Weight: 98.1 kg 98.3 kg 81.6 kg   Examination:  General exam: Appears calm and comfortable Oral: resolved oral yeast lesions.  Respiratory system: Clear to auscultation. Respiratory effort normal. Cardiovascular system: normal S1 & S2 heard. No JVD, murmurs, rubs, gallops or clicks. 1+ pedal edema. Gastrointestinal system: Abdomen is nondistended, soft and nontender. No organomegaly or masses felt. Normal bowel sounds heard. Central nervous system: Alert and oriented. No focal neurological deficits. Extremities: 1+ edema BLEs.  Symmetric 5 x 5 power. Skin: No rashes, lesions or ulcers. Psychiatry: Judgement and insight appear normal. Mood & affect appropriate.   Data Reviewed: I have personally reviewed following labs and imaging  studies  CBC: Recent Labs  Lab 03/07/24 1216 03/08/24 0444 03/09/24 0420 03/10/24 0541 03/11/24 0424 03/12/24 0449  WBC 9.1 7.3 5.5 5.8 7.6 8.4  NEUTROABS 6.4  --   --   --   --   --   HGB 10.9* 10.0* 9.5* 9.1* 9.2* 9.4*  HCT 34.0* 31.4* 30.2* 29.2* 29.4* 30.1*  MCV 89.5 89.2 89.6 92.1 91.6 91.5  PLT 497* 448* 424* 418* 439* 500*    Basic Metabolic Panel: Recent Labs  Lab 03/08/24 0444 03/09/24 0420 03/10/24 0541 03/11/24 0424 03/12/24 0449  NA 137 137 138 137 136  K 4.7 3.9 4.2 3.9 4.1  CL 97* 98 102 101 99  CO2 25 25 26 24 26   GLUCOSE 99 102* 121* 126* 138*  BUN 82* 78* 71* 59* 57*  CREATININE 6.24* 5.67* 5.13* 4.55* 4.22*  CALCIUM  9.8 9.8 9.8 10.0 10.8*  MG 2.1  --   --   --  1.9  PHOS 5.1* 4.8* 4.4  --  4.0    CBG: Recent Labs  Lab 03/11/24 1614 03/11/24 2017 03/12/24 0337 03/12/24 0727 03/12/24 1117  GLUCAP 152* 149* 132* 124* 138*    Recent Results (from the past 240 hours)  Blood culture (routine x 2)     Status: None   Collection Time: 03/07/24 12:16 PM   Specimen: BLOOD  Result Value Ref Range Status   Specimen Description BLOOD LEFT ANTECUBITAL  Final   Special Requests   Final    BOTTLES DRAWN AEROBIC AND ANAEROBIC Blood Culture adequate volume   Culture   Final    NO GROWTH 5 DAYS Performed at [facility], [address] [place], [place] [address]    Report Status 03/12/2024 FINAL  Final  Blood culture (routine x 2)     Status: None   Collection Time: 03/07/24 12:30 PM   Specimen: BLOOD RIGHT ARM  Result Value Ref Range Status   Specimen Description BLOOD RIGHT ARM  Final   Special Requests   Final    BOTTLES DRAWN AEROBIC ONLY Blood Culture adequate volume   Culture   Final    NO GROWTH 5 DAYS Performed at [facility], [address], [place], [place] [address]    Report Status 03/12/2024 FINAL  Final  Urine Culture     Status: None   Collection Time: 03/07/24 12:46 PM   Specimen: Urine, Random  Result Value Ref Range Status    Specimen Description   Final    URINE, RANDOM Performed at [facility], [address] [place], [place] [address]    Special Requests   Final    NONE Reflexed from [id number] Performed at [facility], [address], [place], [place] [address]    Culture   Final    NO GROWTH Performed at [facility], [address] [place], [place] [address]    Report Status 03/08/2024 FINAL  Final     Radiology Studies: No results found.   Scheduled Meds:  amoxicillin -clavulanate  1 tablet Oral BID   Chlorhexidine  Gluconate Cloth  6 each Topical Daily   enoxaparin  (LOVENOX ) injection  30 mg Subcutaneous Q24H   insulin  aspart  0-9 Units Subcutaneous TID WC   linezolid   600 mg Oral Q12H   nystatin   5 mL Oral QID   saccharomyces boulardii  250 mg Oral BID   tamsulosin   0.4 mg Oral QPC breakfast   Continuous Infusions:   LOS: 5 days   Time spent: 50 mins  Danel Requena Vicci, MD How to contact the [facility] Attending or Consulting provider 7A - 7P or covering provider during after hours 7P -7A, for this patient?  Check the care team in [facility] and look for a) attending/consulting TRH provider listed and b) the TRH team listed Log into [email or web address] to find provider on call.  Locate the TRH provider you are looking for under [facility] and page to a number that you can be directly reached. If you still have difficulty reaching the provider, please page the [facility] (Director on Call) for the Hospitalists listed on amion for assistance.  03/12/2024, 11:35 AM    "

## 2024-03-12 NOTE — Plan of Care (Signed)
" °  Problem: Education: Goal: Knowledge of General Education information will improve Description: Including pain rating scale, medication(s)/side effects and non-pharmacologic comfort measures Outcome: Progressing   Problem: Clinical Measurements: Goal: Ability to maintain clinical measurements within normal limits will improve Outcome: Progressing   Problem: Activity: Goal: Risk for activity intolerance will decrease Outcome: Progressing   Problem: Elimination: Goal: Will not experience complications related to urinary retention Outcome: Progressing   Problem: Pain Managment: Goal: General experience of comfort will improve and/or be controlled Outcome: Progressing   Problem: Safety: Goal: Ability to remain free from injury will improve Outcome: Progressing   Problem: Skin Integrity: Goal: Risk for impaired skin integrity will decrease Outcome: Progressing   Problem: Education: Goal: Ability to describe self-care measures that may prevent or decrease complications (Diabetes Survival Skills Education) will improve Outcome: Progressing   "

## 2024-03-13 ENCOUNTER — Inpatient Hospital Stay: Admitting: Infectious Diseases

## 2024-03-13 DIAGNOSIS — E785 Hyperlipidemia, unspecified: Secondary | ICD-10-CM | POA: Diagnosis not present

## 2024-03-13 DIAGNOSIS — N200 Calculus of kidney: Secondary | ICD-10-CM | POA: Diagnosis not present

## 2024-03-13 DIAGNOSIS — R531 Weakness: Secondary | ICD-10-CM | POA: Diagnosis not present

## 2024-03-13 DIAGNOSIS — N179 Acute kidney failure, unspecified: Secondary | ICD-10-CM | POA: Diagnosis not present

## 2024-03-13 LAB — RENAL FUNCTION PANEL
Albumin: 3 g/dL — ABNORMAL LOW (ref 3.5–5.0)
Anion gap: 13 (ref 5–15)
BUN: 57 mg/dL — ABNORMAL HIGH (ref 8–23)
CO2: 25 mmol/L (ref 22–32)
Calcium: 11 mg/dL — ABNORMAL HIGH (ref 8.9–10.3)
Chloride: 98 mmol/L (ref 98–111)
Creatinine, Ser: 4.02 mg/dL — ABNORMAL HIGH (ref 0.61–1.24)
GFR, Estimated: 14 mL/min — ABNORMAL LOW
Glucose, Bld: 123 mg/dL — ABNORMAL HIGH (ref 70–99)
Phosphorus: 4.3 mg/dL (ref 2.5–4.6)
Potassium: 4 mmol/L (ref 3.5–5.1)
Sodium: 136 mmol/L (ref 135–145)

## 2024-03-13 LAB — VITAMIN D 25 HYDROXY (VIT D DEFICIENCY, FRACTURES): Vit D, 25-Hydroxy: 82.5 ng/mL (ref 30–100)

## 2024-03-13 LAB — GLUCOSE, CAPILLARY
Glucose-Capillary: 123 mg/dL — ABNORMAL HIGH (ref 70–99)
Glucose-Capillary: 123 mg/dL — ABNORMAL HIGH (ref 70–99)
Glucose-Capillary: 135 mg/dL — ABNORMAL HIGH (ref 70–99)
Glucose-Capillary: 131 mg/dL — ABNORMAL HIGH (ref 70–99)
Glucose-Capillary: 158 mg/dL — ABNORMAL HIGH (ref 70–99)

## 2024-03-13 NOTE — Evaluation (Signed)
 Physical Therapy Evaluation Patient Details Name: Marcus Simpson MRN: [id number] DOB: Jul 11, 1945 Today's Date: 03/13/2024  History of Present Illness  Marcus Simpson is a 78 year old male with complex past medical history including type 2 diabetes mellitus, hypertension, anemia of chronic disease, stage IIIb CKD, history of prostate cancer, gout, history of left acute pyelonephritis, left ureteral stone status post left ureteral stent placement by Dr. Sherrilee, MRSA bacteremia, Pseudomonas bacteremia, recently discharged with a PICC line and IV daptomycin  for presumed IE since patient refused TEE.  TTE was negative for vegetation.  Unfortunately patient reports that for the past several days he has been feeling unwell with progressive weakness.  He also reports having fever at home.  He pulled his right PICC line out and refused further IV antibiotics.  Home health nursing called infectious disease doctor who recommended that he take Zyvox  600 mg twice daily through end date of 03/20/2024 which was his original end date.  Patient and wife strongly felt that the treatments he was receiving was causing his progressive weakness and debility.  He has had poor oral intake over the past several days, poor appetite and reports that he is supposed to have his left ureteral stent removed by Dr. Sherrilee on Monday, 03/12/2024.   Clinical Impression  Patient required repeated attempts before able to complete sit to stands due to BLE weakness and limited to a few side steps before having to sit due fatigue, weakness and SOB with Spo2 dropping from 91% to 86% with exertion. Patient tolerated staying up in chair after therapy. Patient will benefit from continued skilled physical therapy in hospital and recommended venue below to increase strength, balance, endurance for safe ADLs and gait.          If plan is discharge home, recommend the following: A lot of help with bathing/dressing/bathroom;A lot of help  with walking and/or transfers;Help with stairs or ramp for entrance;Assist for transportation;Assistance with cooking/housework   Can travel by private vehicle   No    Equipment Recommendations None recommended by PT  Recommendations for Other Services       Functional Status Assessment Patient has had a recent decline in their functional status and demonstrates the ability to make significant improvements in function in a reasonable and predictable amount of time.     Precautions / Restrictions Precautions Precautions: Fall Recall of Precautions/Restrictions: Intact Restrictions Weight Bearing Restrictions Per Provider Order: No      Mobility  Bed Mobility Overal bed mobility: Needs Assistance Bed Mobility: Supine to Sit, Sit to Supine     Supine to sit: Min assist Sit to supine: Min assist   General bed mobility comments: had difficulty moving legs due to weakness    Transfers Overall transfer level: Needs assistance Equipment used: Rolling walker (2 wheels) Transfers: Sit to/from Stand, Bed to chair/wheelchair/BSC Sit to Stand: Mod assist   Step pivot transfers: Mod assist       General transfer comment: required repeated attempts before able to complete sit to stands due to BLE weakness    Ambulation/Gait Ambulation/Gait assistance: Mod assist Gait Distance (Feet): 4 Feet Assistive device: Rolling walker (2 wheels) Gait Pattern/deviations: Decreased step length - right, Decreased step length - left, Decreased stride length, Knees buckling Gait velocity: slow     General Gait Details: limited to a few slo wlabored side steps due to fatigue, difficulty breathing with SpO2 dropping from 91% to 86% on room air  Stairs  Wheelchair Mobility     Tilt Bed    Modified Rankin (Stroke Patients Only)       Balance Overall balance assessment: Needs assistance Sitting-balance support: Feet supported, No upper extremity supported Sitting  balance-Leahy Scale: Fair Sitting balance - Comments: fair/good seated at EOB   Standing balance support: Reliant on assistive device for balance, During functional activity, Bilateral upper extremity supported Standing balance-Leahy Scale: Poor Standing balance comment: using RW                             Pertinent Vitals/Pain Pain Assessment Pain Assessment: No/denies pain    Home Living Family/patient expects to be discharged to:: Private residence Living Arrangements: Spouse/significant other Available Help at Discharge: Family;Available 24 hours/day Type of Home: House Home Access: Stairs to enter   [employer] of Steps: 1   Home Layout: One level Home Equipment: [employer]ant (2 wheels);Cane - single point      Prior Function Prior Level of Function : Independent/Modified Independent             Mobility Comments: ind ADLs Comments: mod ind     Extremity/Trunk Assessment   Upper Extremity Assessment Upper Extremity Assessment: Defer to OT evaluation    Lower Extremity Assessment Lower Extremity Assessment: Generalized weakness    Cervical / Trunk Assessment Cervical / Trunk Assessment: Kyphotic  Communication   Communication Communication: Impaired Factors Affecting Communication: Hearing impaired    Cognition Arousal: Alert Behavior During Therapy: WFL for tasks assessed/performed                             Following commands: Intact       Cueing Cueing Techniques: Verbal cues     General Comments      Exercises     Assessment/Plan    PT Assessment Patient needs continued PT services  PT Problem List Decreased strength;Decreased activity tolerance;Decreased balance;Decreased mobility;Cardiopulmonary status limiting activity       PT Treatment Interventions DME instruction;Gait training;Stair training;Functional mobility training;Therapeutic activities;Therapeutic exercise;Balance  training;Patient/family education    PT Goals (Current goals can be found in the Care Plan section)  Acute Rehab PT Goals Patient Stated Goal: return home with family to assist PT Goal Formulation: With patient/family Time For Goal Achievement: 03/27/24 Potential to Achieve Goals: Good    Frequency Min 3X/week     Co-evaluation               AM-PAC PT 6 Clicks Mobility  Outcome Measure Help needed turning from your back to your side while in a flat bed without using bedrails?: A Little Help needed moving from lying on your back to sitting on the side of a flat bed without using bedrails?: A Lot Help needed moving to and from a bed to a chair (including a wheelchair)?: A Lot Help needed standing up from a chair using your arms (e.g., wheelchair or bedside chair)?: A Lot Help needed to walk in hospital room?: A Lot Help needed climbing 3-5 steps with a railing? : Total 6 Click Score: 12    End of Session   Activity Tolerance: Patient tolerated treatment well;Patient limited by fatigue Patient left: in chair;with call bell/phone within reach;with family/visitor present Nurse Communication: Mobility status PT Visit Diagnosis: Unsteadiness on feet (R26.81);Other abnormalities of gait and mobility (R26.89);Muscle weakness (generalized) (M62.81)    Time: 8944-8874 PT Time Calculation (min) (ACUTE ONLY):  30 min   Charges:   PT Evaluation $PT Eval Moderate Complexity: 1 Mod PT Treatments $Therapeutic Activity: 23-37 mins PT General Charges $$ ACUTE PT VISIT: 1 Visit         2:39 PM, 03/13/2024 Lynwood Music, MPT Physical Therapist with [facility] 336 [phone] office [phone] mobile phone

## 2024-03-13 NOTE — Plan of Care (Signed)
" °  Problem: Acute Rehab OT Goals (only OT should resolve) Goal: Pt. Will Perform Grooming Flowsheets (Taken 03/13/2024 1516) Pt Will Perform Grooming:  standing  with supervision  with contact guard assist Goal: Pt. Will Perform Lower Body Bathing Flowsheets (Taken 03/13/2024 1516) Pt Will Perform Lower Body Bathing:  with contact guard assist  sitting/lateral leans Goal: Pt. Will Perform Lower Body Dressing Flowsheets (Taken 03/13/2024 1516) Pt Will Perform Lower Body Dressing:  with contact guard assist  sitting/lateral leans Goal: Pt. Will Transfer To Toilet Flowsheets (Taken 03/13/2024 1516) Pt Will Transfer to Toilet:  with supervision  stand pivot transfer Goal: Pt. Will Perform Toileting-Clothing Manipulation Flowsheets (Taken 03/13/2024 1516) Pt Will Perform Toileting - Clothing Manipulation and hygiene:  with contact guard assist  sitting/lateral leans Goal: Pt/Caregiver Will Perform Home Exercise Program Flowsheets (Taken 03/13/2024 1516) Pt/caregiver will Perform Home Exercise Program:  Increased strength  Both right and left upper extremity  Independently  Lorynn Moeser OT, MOT  "

## 2024-03-13 NOTE — Plan of Care (Signed)

## 2024-03-13 NOTE — Progress Notes (Signed)
 Mobility Specialist Progress Note:    03/13/24 1408  Mobility  Activity Pivoted/transferred to/from BSC  Level of Assistance Moderate assist, patient does 50-74% (+2)  Assistive Device None  Distance Ambulated (ft) 2 ft  Range of Motion/Exercises Active;All extremities  Activity Response Tolerated well  Mobility Referral Yes  Mobility visit 1 Mobility  Mobility Specialist Start Time (ACUTE ONLY) 1408  Mobility Specialist Stop Time (ACUTE ONLY) 1428  Mobility Specialist Time Calculation (min) (ACUTE ONLY) 20 min   Pt received in chair, RN requesting assistance with stand and pivot. Required ModA+2 to stand and pivot with no AD. Tolerated well, overall weakness. Returned to chair, family and RN in room. All needs met.  Moesha Sarchet Mobility Specialist Please contact via Special Educational Needs Teacher or  Rehab office at [phone]

## 2024-03-13 NOTE — Progress Notes (Signed)
 Patient ID: Marcus Simpson, male   DOB: 1945/09/19, 78 y.o.   MRN: [id number]  Assessment/Plan:  AKI/CKD stage IIIb - in setting of poor po intake for more than a week.  Likely pre-renal insult but he has also been on antibiotics for several weeks so AIN is also on the differential diagnosis.  CT stone protocol with proper placement of left JJ ureteral stent, no hydronephrosis.  Started on isotonic bicarb with improvement of BUN/Cr.  She responded to IVF's but with edema + good UOP -> stopped IVF's and follow UOP and Scr closely.  Lowly improving and she actually is drinking more than she is eating.  She does not like the hospital food.  No indications for dialysis at this time. Avoid nephrotoxic medications including NSAIDs and iodinated intravenous contrast exposure unless the latter is absolutely indicated.   Preferred narcotic agents for pain control are hydromorphone, fentanyl , and methadone. Morphine  should not be used.  Avoid Baclofen and avoid oral sodium phosphate  and magnesium  citrate based laxatives / bowel preps.  Continue strict Input and Output monitoring.  Will monitor the patient closely with you and intervene or adjust therapy as indicated by changes in clinical status/labs  AGMA - due to #1.  Improved with isotonic bicarb.Stopped the IVF's on 12/22.  Multifocal pneumonia - per primary svc and ID Generalized weakness - likely due to #1 and poor po intake for 7-10 days.  Will need PT/OT eval Bilateral nephrolithiasis - s/p left JJ ureteral stent on 03/01/24 and to have it removed on 03/12/24 HTN - stable Anemia of chronic disease MRSE bacteremia - PICC line out and had been on IV daptomycin .  Per ID, zyvox  600 mg bid through 03/20/24 Dm type 2 - per primary  S: No new complaints; still has headaches but it is better than yesterday.  She is tolerating p.o but does not like the hospital food which is why she is not eating much of the solids.  She still has not had a bowel movement in  about 5 days.  Shortness of breath, fever, chills, nausea. SABRA O:BP 134/74 (BP Location: Right Arm)   Pulse 98   Temp 98.3 F (36.8 C)   Resp 18   Ht 5' 11 (1.803 m)   Wt 98.1 kg   SpO2 93%   BMI 30.16 kg/m   Intake/Output Summary (Last 24 hours) at 03/13/2024 0918 Last data filed at 03/12/2024 2123 Gross per 24 hour  Intake 720 ml  Output 500 ml  Net 220 ml   Intake/Output: I/O last 3 completed shifts: In: 1260 [P.O.:1260] Out: 1300 [Urine:1300]  Intake/Output this shift:  No intake/output data recorded. Weight change: 16.5 kg Gen: NAD CVS: RRR Resp: CTA Abd: +BS, soft, NT/ND Ext: trace to 1 + pretibial edema  Recent Labs  Lab 03/07/24 1216 03/08/24 0444 03/09/24 0420 03/10/24 0541 03/11/24 0424 03/12/24 0449 03/13/24 0444  NA 137 137 137 138 137 136 136  K 4.2 4.7 3.9 4.2 3.9 4.1 4.0  CL 100 97* 98 102 101 99 98  CO2 16* 25 25 26 24 26 25   GLUCOSE 97 99 102* 121* 126* 138* 123*  BUN 84* 82* 78* 71* 59* 57* 57*  CREATININE 6.87* 6.24* 5.67* 5.13* 4.55* 4.22* 4.02*  ALBUMIN 3.2* 3.0* 2.8*  --   --   --  3.0*  CALCIUM  10.2 9.8 9.8 9.8 10.0 10.8* 11.0*  PHOS  --  5.1* 4.8* 4.4  --  4.0 4.3  AST 59* 49* 40  --   --   --   --  ALT 13 12 12   --   --   --   --    Liver Function Tests: Recent Labs  Lab 03/07/24 1216 03/08/24 0444 03/09/24 0420 03/13/24 0444  AST 59* 49* 40  --   ALT 13 12 12   --   ALKPHOS 135* 119 126  --   BILITOT 0.9 1.2 1.1  --   PROT 8.3* 7.4 7.0  --   ALBUMIN 3.2* 3.0* 2.8* 3.0*   No results for input(s): LIPASE, AMYLASE in the last 168 hours. No results for input(s): AMMONIA in the last 168 hours. CBC: Recent Labs  Lab 03/07/24 1216 03/08/24 0444 03/09/24 0420 03/10/24 0541 03/11/24 0424 03/12/24 0449  WBC 9.1 7.3 5.5 5.8 7.6 8.4  NEUTROABS 6.4  --   --   --   --   --   HGB 10.9* 10.0* 9.5* 9.1* 9.2* 9.4*  HCT 34.0* 31.4* 30.2* 29.2* 29.4* 30.1*  MCV 89.5 89.2 89.6 92.1 91.6 91.5  PLT 497* 448* 424* 418* 439*  500*   Cardiac Enzymes: No results for input(s): CKTOTAL, CKMB, CKMBINDEX, TROPONINI in the last 168 hours. CBG: Recent Labs  Lab 03/12/24 1117 03/12/24 1628 03/12/24 2015 03/13/24 0256 03/13/24 0743  GLUCAP 138* 106* 144* 123* 123*    Iron Studies: No results for input(s): IRON, TIBC, TRANSFERRIN, FERRITIN in the last 72 hours. Studies/Results: No results found.  amoxicillin -clavulanate  1 tablet Oral BID   Chlorhexidine  Gluconate Cloth  6 each Topical Daily   enoxaparin  (LOVENOX ) injection  30 mg Subcutaneous Q24H   insulin  aspart  0-9 Units Subcutaneous TID WC   linezolid   600 mg Oral Q12H   nystatin   5 mL Oral QID   saccharomyces boulardii  250 mg Oral BID   tamsulosin   0.4 mg Oral QPC breakfast    BMET    Component Value Date/Time   NA 136 03/13/2024 0444   K 4.0 03/13/2024 0444   CL 98 03/13/2024 0444   CO2 25 03/13/2024 0444   GLUCOSE 123 (H) 03/13/2024 0444   BUN 57 (H) 03/13/2024 0444   BUN 20 06/29/2022 1354   CREATININE 4.02 (H) 03/13/2024 0444   CALCIUM  11.0 (H) 03/13/2024 0444   GFRNONAA 14 (L) 03/13/2024 0444   GFRAA >60 05/04/2017 0525   CBC    Component Value Date/Time   WBC 8.4 03/12/2024 0449   RBC 3.29 (L) 03/12/2024 0449   HGB 9.4 (L) 03/12/2024 0449   HCT 30.1 (L) 03/12/2024 0449   PLT 500 (H) 03/12/2024 0449   MCV 91.5 03/12/2024 0449   MCH 28.6 03/12/2024 0449   MCHC 31.2 03/12/2024 0449   RDW 15.5 03/12/2024 0449   LYMPHSABS 0.9 03/07/2024 1216   MONOABS 1.0 03/07/2024 1216   EOSABS 0.6 (H) 03/07/2024 1216   BASOSABS 0.0 03/07/2024 1216

## 2024-03-13 NOTE — NC FL2 (Signed)
 " [place]  MEDICAID [id number] LEVEL OF CARE FORM     IDENTIFICATION  Patient Name: Marcus Simpson Birthdate: [birth year] Sex: male Admission Date (Current Location): 03/07/2024  [facility] and [place] Number:  [employer] and Address:  [facility],  [address] [place] [address]      Provider Number: [id number]  Attending Physician Name and Address:  Vicci Afton CROME, MD  Relative Name and Phone Number:  Lj, Miyamoto, Emergency Contact  [phone]    Current Level of Care: Hospital Recommended Level of Care: Skilled Nursing Facility Prior Approval Number:    Date Approved/Denied:   PASRR Number: [id number]  Discharge Plan: SNF    Current Diagnoses: Patient Active Problem List   Diagnosis Date Noted   AKI (acute kidney injury) 03/07/2024   Generalized weakness 03/07/2024   Microcytic anemia 03/07/2024   Pneumonia 03/07/2024   Metabolic acidosis 03/07/2024   Bilateral nephrolithiasis 03/07/2024   Bacteremia due to Pseudomonas 02/21/2024   Septic shock (HCC) 02/18/2024   Acute gout 02/11/2024   Effusion of right knee joint 02/10/2024   Bacteremia due to methicillin resistant Staphylococcus epidermidis 02/08/2024   Acute pyelonephritis 02/08/2024   Left ureteral calculus 02/08/2024   Anemia of chronic disease 02/08/2024   Well controlled type 2 diabetes mellitus (HCC) 02/08/2024   Bilateral lower extremity edema 02/08/2024   Severe sepsis (HCC) 02/08/2024   Acute kidney injury due to urinary tract obstruction 02/05/2024   Sepsis secondary to UTI (HCC) 12/29/2022   Acute encephalopathy 12/29/2022   Hydronephrosis with obstructing calculus 12/29/2022   Ureteral stone with hydronephrosis 12/28/2022   OA (osteoarthritis) of knee 05/02/2017   Diarrhea 03/10/2015   Prostate cancer (HCC) 03/10/2015   Hyperlipidemia 11/13/2008   Essential hypertension 11/13/2008   SYNCOPE AND COLLAPSE 11/13/2008    Orientation RESPIRATION BLADDER  Height & Weight     Self, Situation, Time, Place  O2 (1.5 L) Incontinent Weight: 216 lb 4.3 oz (98.1 kg) Height:  5' 11 (180.3 cm)  BEHAVIORAL SYMPTOMS/MOOD NEUROLOGICAL BOWEL NUTRITION STATUS      Continent Diet (See DC summary)  AMBULATORY STATUS COMMUNICATION OF NEEDS Skin   Limited Assist Verbally Surgical wounds                       Personal Care Assistance Level of Assistance  Bathing, Feeding, Dressing Bathing Assistance: Limited assistance Feeding assistance: Independent Dressing Assistance: Limited assistance     Functional Limitations Info  Sight, Hearing, Speech Sight Info: Impaired (eyeglasses) Hearing Info: Impaired Speech Info: Adequate    SPECIAL CARE FACTORS FREQUENCY  PT (By licensed PT), OT (By licensed OT)     PT Frequency: 5x/wk OT Frequency: 5x/wk            Contractures Contractures Info: Not present    Additional Factors Info  Code Status, Allergies Code Status Info: FULL Allergies Info: Dilaudid (Hydromorphone Hcl)           Current Medications (03/13/2024):  This is the current hospital active medication list Current Facility-Administered Medications  Medication Dose Route Frequency Provider Last Rate Last Admin   acetaminophen  (TYLENOL ) tablet 650 mg  650 mg Oral Q6H PRN Vicci, Clanford L, MD   650 mg at 03/09/24 2022   Or   acetaminophen  (TYLENOL ) suppository 650 mg  650 mg Rectal Q6H PRN Johnson, Clanford L, MD       amoxicillin -clavulanate (AUGMENTIN ) 500-125 MG per tablet 1 tablet  1 tablet Oral BID Vicci,  Clanford L, MD   1 tablet at 03/13/24 0850   bisacodyl  (DULCOLAX) EC tablet 5 mg  5 mg Oral Daily PRN Vicci, Clanford L, MD       Chlorhexidine  Gluconate Cloth 2 % PADS 6 each  6 each Topical Daily Vicci, Clanford L, MD   6 each at 03/13/24 0859   diphenhydrAMINE  (BENADRYL ) capsule 25 mg  25 mg Oral Q8H PRN Johnson, Clanford L, MD   25 mg at 03/13/24 0136   enoxaparin  (LOVENOX ) injection 30 mg  30 mg Subcutaneous  Q24H Johnson, Clanford L, MD   30 mg at 03/12/24 2123   fentaNYL  (SUBLIMAZE ) injection 12.5 mcg  12.5 mcg Intravenous Q2H PRN Johnson, Clanford L, MD       insulin  aspart (novoLOG ) injection 0-9 Units  0-9 Units Subcutaneous TID WC Johnson, Clanford L, MD   1 Units at 03/12/24 1134   linezolid  (ZYVOX ) tablet 600 mg  600 mg Oral Q12H Johnson, Clanford L, MD   600 mg at 03/13/24 0850   nystatin  (MYCOSTATIN ) 100000 UNIT/ML suspension 500,000 Units  5 mL Oral QID Jesus America, NP   500,000 Units at 03/13/24 9149   ondansetron  (ZOFRAN ) tablet 4 mg  4 mg Oral Q6H PRN Johnson, Clanford L, MD       Or   ondansetron  (ZOFRAN ) injection 4 mg  4 mg Intravenous Q6H PRN Johnson, Clanford L, MD       oxyCODONE  (Oxy IR/ROXICODONE ) immediate release tablet 5 mg  5 mg Oral Q6H PRN Johnson, Clanford L, MD   5 mg at 03/12/24 1930   saccharomyces boulardii (FLORASTOR) capsule 250 mg  250 mg Oral BID Johnson, Clanford L, MD   250 mg at 03/13/24 9149   tamsulosin  (FLOMAX ) capsule 0.4 mg  0.4 mg Oral QPC breakfast Johnson, Clanford L, MD   0.4 mg at 03/13/24 9149   traZODone  (DESYREL ) tablet 25 mg  25 mg Oral QHS PRN Johnson, Clanford L, MD   25 mg at 03/12/24 2232     Discharge Medications: Please see discharge summary for a list of discharge medications.  Relevant Imaging Results:  Relevant Lab Results:   Additional Information SSN: [id number]  Hoy DELENA Bigness, LCSW     "

## 2024-03-13 NOTE — Progress Notes (Addendum)
 " PROGRESS NOTE   SENA CLOUATRE  FMW:[id number] DOB: 30-Jul-1945 DOA: 03/07/2024 PCP: [name], MD   Chief Complaint  Patient presents with   Weakness   Level of care: Med-Surg  Brief Admission History:  78 year old male with complex past medical history including type 2 diabetes mellitus, hypertension, anemia of chronic disease, stage IIIb CKD, history of prostate cancer, gout, history of left acute pyelonephritis, left ureteral stone status post left ureteral stent placement by Dr. Sherrilee, MRSA bacteremia, Pseudomonas bacteremia, recently discharged with a PICC line and IV daptomycin  for presumed IE since patient refused TEE.  TTE was negative for vegetation.  Unfortunately patient reports that for the past several days he has been feeling unwell with progressive weakness.  He also reports having fever at home.  He pulled his right PICC line out and refused further IV antibiotics.  Home health nursing called infectious disease doctor who recommended that he take Zyvox  600 mg twice daily through end date of 03/20/2024 which was his original end date.  Patient and wife strongly felt that the treatments he was receiving was causing his progressive weakness and debility.  He has had poor oral intake over the past several days, poor appetite and reports that he is supposed to have his left ureteral stent removed by Dr. Sherrilee on Monday, 03/12/2024.  On arrival he was noted to be clinically dehydrated and ill-appearing.  His creatinine had risen to 6.87, up from 1.87 on 02/22/2024.  His hemoglobin is stable at 10.9.  His lactic acid was 2.0.  His CO2 was 16.  His AST was 59, ALT 13, GFR estimated at 8 mL/min.  His urinalysis was positive for hemoglobin, moderate leukocytes protein, rare bacteria 21-50 WBC per high-power field.  CT stone study suggesting consolidation in the lung bases, bilateral renal stones and bladder stones, left ureteral stone in place than previously seen left ureteral stone no  longer present.  Patient was given IV fluids and admission requested for AKI management.   Assessment and Plan:  AKI on CKD stage 3b -- IMPROVING  -- from poor oral intake from to painful yeast stomatitis  -- improved with IV fluid hydration -- per nephrology ok to DC IVF 12/22 -- DC foley cath today and follow urine output and monitor bladder scans -- follow daily renal function labs--creatinine improved to 4.02    Intake/Output Summary (Last 24 hours) at 03/13/2024 1540 Last data filed at 03/12/2024 2123 Gross per 24 hour  Intake 480 ml  Output --  Net 480 ml   Generalized Weakness -- PT eval requested   -- treating supportively for now   Bilateral nephrolithiasis -- no signs of obstruction at this time -- pt was due to have ureteral stent removed by Dr. Sherrilee on 03/12/24 -- notified Dr. Sherrilee and he said that his office will schedule to have stent removed in the office   Aspiration pneumonia - IMPROVING -- ordered CT chest without contrast for further investigation and work up -- CT confirms multifocal pneumonia  -- completing course of augmentin  on 03/13/24  Hypercalcemia -- mild elevation, asymptomatic -- recheck in AM   Generalized weakness Physical debility  Adult Failure to thrive -- PT/OT eval requested and SNF rehab recommended    Oral Candidiasis--TREATED and IMPROVED  -- fluconazole  ordered and 2 separate doses given -- treated with nystatin  swish  -- he reports much improved symptoms and eating / drinking better   Essential hypertension  -- stable, follow -- resume home  meds     MRSE bacteremia -- pt had a PICC line and was on IV daptomycin  until 12/17 when he removed PICC line and refused further IV antibiotics -- the home heath RN had contacted ID physician and the recommendation was to start zyvox  600 mg BID to complete course of treatment thru 03/20/24   Anemia of chronic disease -- Hg stable from recent testing around 10 -- Hg may trend  down from hemodilution s/p IV fluids   Metabolic Acidosis--resolved  -- secondary to worsening renal failure  -- sodium bicarbonate  IV infusion completed    Bacteremia due to pseudomonas-completed treatment  -- pt reports he completed the course of antibiotics for this    Type 2 DM, controlled with renal complications -- SSI coverage with frequent CBG monitoring ordered CBG (last 3)  Recent Labs    03/13/24 0256 03/13/24 0743 03/13/24 1136  GLUCAP 123* 123* 135*    DVT prophylaxis: enoxaparin  Code Status: Full  Family Communication: wife at bedside updated 12/17-18, son, daughter 12/21, 12/22 Disposition: anticipate SNF rehab    Consultants:  Nephrology   Procedures:   Antimicrobials:  Zyvox  >> (should end 03/20/24) Augmentin  12/19>>12/23  Subjective: Pt reports being fatigued but willing to work with PT and not having difficulty urinating post foley removal.   Objective: Vitals:   03/12/24 1106 03/12/24 1900 03/13/24 0254 03/13/24 0459  BP:  137/72 134/74   Pulse:  (!) 103 98   Resp:  20 18   Temp:  97.6 F (36.4 C) 98.3 F (36.8 C)   TempSrc:  Oral    SpO2: 94% 91% 93%   Weight:    98.1 kg  Height:        Intake/Output Summary (Last 24 hours) at 03/13/2024 1540 Last data filed at 03/12/2024 2123 Gross per 24 hour  Intake 480 ml  Output --  Net 480 ml   Filed Weights   03/11/24 0442 03/12/24 0514 03/13/24 0459  Weight: 98.3 kg 81.6 kg 98.1 kg   Examination:  General exam: Appears calm and comfortable Oral: resolved oral yeast lesions.  Respiratory system: Clear to auscultation. Respiratory effort normal. Cardiovascular system: normal S1 & S2 heard. No JVD, murmurs, rubs, gallops or clicks. 1+ pedal edema. Gastrointestinal system: Abdomen is nondistended, soft and nontender. No organomegaly or masses felt. Normal bowel sounds heard. Central nervous system: Alert and oriented. No focal neurological deficits. Extremities: 2+ edema BLEs.  Symmetric  5 x 5 power. Skin: No rashes, lesions or ulcers. Psychiatry: Judgement and insight appear normal. Mood & affect appropriate.   Data Reviewed: I have personally reviewed following labs and imaging studies  CBC: Recent Labs  Lab 03/07/24 1216 03/08/24 0444 03/09/24 0420 03/10/24 0541 03/11/24 0424 03/12/24 0449  WBC 9.1 7.3 5.5 5.8 7.6 8.4  NEUTROABS 6.4  --   --   --   --   --   HGB 10.9* 10.0* 9.5* 9.1* 9.2* 9.4*  HCT 34.0* 31.4* 30.2* 29.2* 29.4* 30.1*  MCV 89.5 89.2 89.6 92.1 91.6 91.5  PLT 497* 448* 424* 418* 439* 500*    Basic Metabolic Panel: Recent Labs  Lab 03/08/24 0444 03/09/24 0420 03/10/24 0541 03/11/24 0424 03/12/24 0449 03/13/24 0444  NA 137 137 138 137 136 136  K 4.7 3.9 4.2 3.9 4.1 4.0  CL 97* 98 102 101 99 98  CO2 25 25 26 24 26 25   GLUCOSE 99 102* 121* 126* 138* 123*  BUN 82* 78* 71* 59* 57* 57*  CREATININE  6.24* 5.67* 5.13* 4.55* 4.22* 4.02*  CALCIUM  9.8 9.8 9.8 10.0 10.8* 11.0*  MG 2.1  --   --   --  1.9  --   PHOS 5.1* 4.8* 4.4  --  4.0 4.3    CBG: Recent Labs  Lab 03/12/24 1628 03/12/24 2015 03/13/24 0256 03/13/24 0743 03/13/24 1136  GLUCAP 106* 144* 123* 123* 135*    Recent Results (from the past 240 hours)  Blood culture (routine x 2)     Status: None   Collection Time: 03/07/24 12:16 PM   Specimen: BLOOD  Result Value Ref Range Status   Specimen Description BLOOD LEFT ANTECUBITAL  Final   Special Requests   Final    BOTTLES DRAWN AEROBIC AND ANAEROBIC Blood Culture adequate volume   Culture   Final    NO GROWTH 5 DAYS Performed at [facility], [address] [place], [place] [address]    Report Status 03/12/2024 FINAL  Final  Blood culture (routine x 2)     Status: None   Collection Time: 03/07/24 12:30 PM   Specimen: BLOOD RIGHT ARM  Result Value Ref Range Status   Specimen Description BLOOD RIGHT ARM  Final   Special Requests   Final    BOTTLES DRAWN AEROBIC ONLY Blood Culture adequate volume   Culture   Final     NO GROWTH 5 DAYS Performed at [facility], [address] [place], [place] [address]    Report Status 03/12/2024 FINAL  Final  Urine Culture     Status: None   Collection Time: 03/07/24 12:46 PM   Specimen: Urine, Random  Result Value Ref Range Status   Specimen Description   Final    URINE, RANDOM Performed at [facility], [address] [place], [place] [address]    Special Requests   Final    NONE Reflexed from [id number] Performed at [facility], [address], [place], [place] [address]    Culture   Final    NO GROWTH Performed at [facility], [address], [place], [place] [address]    Report Status 03/08/2024 FINAL  Final   Radiology Studies: No results found.  Scheduled Meds:  amoxicillin -clavulanate  1 tablet Oral BID   Chlorhexidine  Gluconate Cloth  6 each Topical Daily   enoxaparin  (LOVENOX ) injection  30 mg Subcutaneous Q24H   insulin  aspart  0-9 Units Subcutaneous TID WC   linezolid   600 mg Oral Q12H   nystatin   5 mL Oral QID   saccharomyces boulardii  250 mg Oral BID   tamsulosin   0.4 mg Oral QPC breakfast   Continuous Infusions:   LOS: 6 days   Time spent: 55 mins  Saprina Chuong Vicci, MD How to contact the [facility] Attending or Consulting provider 7A - 7P or covering provider during after hours 7P -7A, for this patient?  Check the care team in [facility] and look for a) attending/consulting TRH provider listed and b) the TRH team listed Log into [email or web address] to find provider on call.  Locate the TRH provider you are looking for under [facility] and page to a number that you can be directly reached. If you still have difficulty reaching the provider, please page the [facility] (Director on Call) for the Hospitalists listed on amion for assistance.  03/13/2024, 3:40 PM    "

## 2024-03-13 NOTE — Plan of Care (Signed)
" °  Problem: Acute Rehab PT Goals(only PT should resolve) Goal: Pt Will Go Supine/Side To Sit Outcome: Progressing Flowsheets (Taken 03/13/2024 1441) Pt will go Supine/Side to Sit:  with contact guard assist  with minimal assist Goal: Patient Will Transfer Sit To/From Stand Outcome: Progressing Flowsheets (Taken 03/13/2024 1441) Patient will transfer sit to/from stand:  with contact guard assist  with minimal assist Goal: Pt Will Transfer Bed To Chair/Chair To Bed Outcome: Progressing Flowsheets (Taken 03/13/2024 1441) Pt will Transfer Bed to Chair/Chair to Bed:  with contact guard assist  with min assist Goal: Pt Will Ambulate Outcome: Progressing Flowsheets (Taken 03/13/2024 1441) Pt will Ambulate:  25 feet  with minimal assist  with moderate assist  with rolling walker   2:42 PM, 03/13/2024 Lynwood Music, MPT Physical Therapist with [facility] [phone] office [phone] mobile phone  "

## 2024-03-13 NOTE — Evaluation (Signed)
 Occupational Therapy Evaluation Patient Details Name: Marcus Simpson MRN: [id number] DOB: 1945-09-03 Today's Date: 03/13/2024   History of Present Illness   Marcus Simpson is a 78 year old male with complex past medical history including type 2 diabetes mellitus, hypertension, anemia of chronic disease, stage IIIb CKD, history of prostate cancer, gout, history of left acute pyelonephritis, left ureteral stone status post left ureteral stent placement by Dr. Sherrilee, MRSA bacteremia, Pseudomonas bacteremia, recently discharged with a PICC line and IV daptomycin  for presumed IE since patient refused TEE.  TTE was negative for vegetation.  Unfortunately patient reports that for the past several days he has been feeling unwell with progressive weakness.  He also reports having fever at home.  He pulled his right PICC line out and refused further IV antibiotics.  Home health nursing called infectious disease doctor who recommended that he take Zyvox  600 mg twice daily through end date of 03/20/2024 which was his original end date.  Patient and wife strongly felt that the treatments he was receiving was causing his progressive weakness and debility.  He has had poor oral intake over the past several days, poor appetite and reports that he is supposed to have his left ureteral stent removed by Dr. Sherrilee on Monday, 03/12/2024.  On arrival he was noted to be clinically dehydrated and ill-appearing.  His creatinine had risen to 6.87, up from 1.87 on 02/22/2024.  His hemoglobin is stable at 10.9.  His lactic acid was 2.0.  His CO2 was 16.  His AST was 59, ALT 13, GFR estimated at 8 mL/min.  His urinalysis was positive for hemoglobin, moderate leukocytes protein, rare bacteria 21-50 WBC per high-power field.  CT stone study suggesting consolidation in the lung bases, bilateral renal stones and bladder stones, left ureteral stone in place than previously seen left ureteral stone no longer present.  Patient was  given IV fluids and admission requested for AKI management. (per MD)     Clinical Impressions Pt agreeable to OT evaluation. Pt reports being independent at baseline. Today the pt was found already sitting up in the chair. Pt completed to trial of sit to stand from the recliner with mod A using the RW both attempts. Each time the pt was only able to stand a couple seconds before feeling weak and needing to sit. B UE fairly weak with 3+/5 grip strength. Pt able to doff one sock but unable to don that sock while seated in the recliner. Observations indicate pt is a level of mod to max for most lower body ADL's. Pt left in the chair with call bell within reach and family present. Pt will benefit from continued OT in the hospital to increase strength, balance, and endurance for safe ADL's.        If plan is discharge home, recommend the following:   A lot of help with walking and/or transfers;A lot of help with bathing/dressing/bathroom;Assistance with cooking/housework;Assist for transportation;Help with stairs or ramp for entrance     Functional Status Assessment   Patient has had a recent decline in their functional status and demonstrates the ability to make significant improvements in function in a reasonable and predictable amount of time.     Equipment Recommendations   None recommended by OT             Precautions/Restrictions   Precautions Precautions: Fall Recall of Precautions/Restrictions: Intact Restrictions Weight Bearing Restrictions Per Provider Order: No     Mobility Bed Mobility  General bed mobility comments: Pt seated in the chair to start the session.    Transfers Overall transfer level: Needs assistance Equipment used: Rolling walker (2 wheels) Transfers: Sit to/from Stand Sit to Stand: Mod assist           General transfer comment: Mod A with RW x2 trials of sit to stand. Pt only able to remain standing for a couple  seconds before needing to sit.      Balance Overall balance assessment: Needs assistance Sitting-balance support: Feet supported, No upper extremity supported Sitting balance-Leahy Scale: Good Sitting balance - Comments: seated in the recliner   Standing balance support: Reliant on assistive device for balance, During functional activity, Bilateral upper extremity supported Standing balance-Leahy Scale: Poor Standing balance comment: using RW                           ADL either performed or assessed with clinical judgement   ADL Overall ADL's : Needs assistance/impaired Eating/Feeding: Independent;Sitting   Grooming: Set up;Sitting   Upper Body Bathing: Set up;Sitting   Lower Body Bathing: Moderate assistance;Sitting/lateral leans   Upper Body Dressing : Set up;Sitting   Lower Body Dressing: Moderate assistance;Maximal assistance;Sitting/lateral leans Lower Body Dressing Details (indicate cue type and reason): Pt able to doff one sock but unable to don while seated in the recliner. Toilet Transfer: Moderate assistance;Stand-pivot;Rolling walker (2 wheels) Toilet Transfer Details (indicate cue type and reason): Partially simulated via sit to stand from recliner x2. Toileting- Clothing Manipulation and Hygiene: Moderate assistance;Sitting/lateral lean;Maximal assistance               Vision Baseline Vision/History: 1 Wears glasses;4 Cataracts Ability to See in Adequate Light: 1 Impaired Patient Visual Report: No change from baseline Vision Assessment?:  (baseline deficits)     Perception Perception: Not tested       Praxis Praxis: Not tested       Pertinent Vitals/Pain Pain Assessment Pain Assessment: No/denies pain     Extremity/Trunk Assessment Upper Extremity Assessment Upper Extremity Assessment: Generalized weakness (4-/5 shoulder flexion MMT bilaterally; 3+/5 grip strength bilaterally.)   Lower Extremity Assessment Lower Extremity  Assessment: Defer to PT evaluation   Cervical / Trunk Assessment Cervical / Trunk Assessment: Kyphotic   Communication Communication Communication: No apparent difficulties Factors Affecting Communication: Hearing impaired   Cognition Arousal: Alert Behavior During Therapy: WFL for tasks assessed/performed Cognition: No apparent impairments                               Following commands: Intact       Cueing  General Comments   Cueing Techniques: Verbal cues                 Home Living Family/patient expects to be discharged to:: Private residence Living Arrangements: Spouse/significant other Available Help at Discharge: Family;Available 24 hours/day Type of Home: House Home Access: Stairs to enter [employer] of Steps: 1   Home Layout: One level     Bathroom Shower/Tub: Producer, Television/film/video: Handicapped height Bathroom Accessibility: Yes   Home Equipment: Agricultural Consultant (2 wheels);Cane - single point;Educational Psychologist (4 wheels)          Prior Functioning/Environment Prior Level of Function : Independent/Modified Independent             Mobility Comments: Tourist information centre manager without AD. ADLs Comments: Independent; driving  OT Problem List: Decreased strength;Decreased activity tolerance;Impaired balance (sitting and/or standing)   OT Treatment/Interventions: Self-care/ADL training;Therapeutic exercise;Therapeutic activities;Balance training;Patient/family education;DME and/or AE instruction;Neuromuscular education;Energy conservation      OT Goals(Current goals can be found in the care plan section)   Acute Rehab OT Goals Patient Stated Goal: Improve function. OT Goal Formulation: With patient/family Time For Goal Achievement: 03/27/24 Potential to Achieve Goals: Fair   OT Frequency:  Min 2X/week                                   End of Session Equipment Utilized During Treatment:  Rolling walker (2 wheels);Gait belt  Activity Tolerance: Patient tolerated treatment well Patient left: in chair;with call bell/phone within reach;with family/visitor present  OT Visit Diagnosis: Unsteadiness on feet (R26.81);Other abnormalities of gait and mobility (R26.89);Muscle weakness (generalized) (M62.81)                Time: 8692-8681 OT Time Calculation (min): 11 min Charges:  OT General Charges $OT Visit: 1 Visit OT Evaluation $OT Eval Low Complexity: 1 Low  Marchella Hibbard OT, MOT  Jayson Person 03/13/2024, 3:13 PM

## 2024-03-13 NOTE — Plan of Care (Signed)
" °  Problem: Education: Goal: Knowledge of General Education information will improve Description: Including pain rating scale, medication(s)/side effects and non-pharmacologic comfort measures Outcome: Progressing   Problem: Clinical Measurements: Goal: Ability to maintain clinical measurements within normal limits will improve Outcome: Progressing Goal: Respiratory complications will improve Outcome: Progressing Goal: Cardiovascular complication will be avoided Outcome: Progressing   Problem: Activity: Goal: Risk for activity intolerance will decrease Outcome: Progressing   Problem: Pain Managment: Goal: General experience of comfort will improve and/or be controlled Outcome: Progressing   Problem: Safety: Goal: Ability to remain free from injury will improve Outcome: Progressing   Problem: Education: Goal: Ability to describe self-care measures that may prevent or decrease complications (Diabetes Survival Skills Education) will improve Outcome: Progressing   "

## 2024-03-13 NOTE — TOC Progression Note (Signed)
 Transition of Care [facility]) - Progression Note    Patient Details  Name: Marcus Simpson MRN: [id number] Date of Birth: [birth year]  [facility] CM/SW Contact  Hoy DELENA Bigness, LCSW Phone Number: 03/13/2024, 11:39 AM  Clinical Narrative:    Pt/spouse agreeable to plan for STR placement at SNF. Pt preference for placement [facility] and 2nd choice is [facility]. Referrals have been faxed out and currently awaiting bed offers.    Expected Discharge Plan: Home w Home Health Services Barriers to Discharge: Continued Medical Work up               Expected Discharge Plan and Services In-house Referral: Clinical Social Work Discharge Planning Services: CM Consult Post Acute Care Choice: Home Health Living arrangements for the past 2 months: Single Family Home                             HH Agency: [facility] Date [facility] Agency Contacted: 03/08/24   Representative spoke with at [facility] Agency: Holli   Social Drivers of Health (SDOH) Interventions SDOH Screenings   Food Insecurity: No Food Insecurity (03/07/2024)  Housing: Low Risk (03/07/2024)  Transportation Needs: No Transportation Needs (03/07/2024)  Utilities: Not At Risk (03/07/2024)  Alcohol Screen: Low Risk (10/11/2023)  Depression (PHQ2-9): Low Risk (01/19/2024)  Social Connections: Moderately Integrated (03/07/2024)  Recent Concern: Social Connections - Moderately Isolated (02/05/2024)  Tobacco Use: Medium Risk (03/07/2024)    Readmission Risk Interventions    03/08/2024    1:38 PM 02/23/2024    3:25 PM  Readmission Risk Prevention Plan  Transportation Screening Complete Complete  PCP or Specialist Appt within 3-5 Days  Complete  HRI or Home Care Consult Complete Complete  Social Work Consult for Recovery Care Planning/Counseling Complete Complete  Palliative Care Screening Not Applicable Not Applicable  Medication Review Oceanographer)  Complete

## 2024-03-13 NOTE — Progress Notes (Signed)
 Mobility Specialist Progress Note:    03/13/24 1005  Mobility  Activity Pivoted/transferred from bed to chair  Level of Assistance Moderate assist, patient does 50-74% (+2)  Assistive Device None  Distance Ambulated (ft) 2 ft  Range of Motion/Exercises Active;All extremities  Activity Response Tolerated well  Mobility Referral Yes  Mobility visit 1 Mobility  Mobility Specialist Start Time (ACUTE ONLY) 1005  Mobility Specialist Stop Time (ACUTE ONLY) 1025  Mobility Specialist Time Calculation (min) (ACUTE ONLY) 20 min   Pt received sitting EOB, agreeable to mobility. Required ModA+2 to stand and pivot with no AD. Tolerated well, BLE weakness. Alarm on, call bell in reach. All needs met.  Kathrin Folden Mobility Specialist Please contact via Special Educational Needs Teacher or  Rehab office at [phone]

## 2024-03-14 DIAGNOSIS — N179 Acute kidney failure, unspecified: Secondary | ICD-10-CM | POA: Diagnosis not present

## 2024-03-14 DIAGNOSIS — N1832 Chronic kidney disease, stage 3b: Secondary | ICD-10-CM

## 2024-03-14 DIAGNOSIS — J690 Pneumonitis due to inhalation of food and vomit: Secondary | ICD-10-CM | POA: Diagnosis not present

## 2024-03-14 DIAGNOSIS — R7881 Bacteremia: Secondary | ICD-10-CM | POA: Diagnosis not present

## 2024-03-14 DIAGNOSIS — E872 Acidosis, unspecified: Secondary | ICD-10-CM | POA: Diagnosis not present

## 2024-03-14 DIAGNOSIS — B957 Other staphylococcus as the cause of diseases classified elsewhere: Secondary | ICD-10-CM | POA: Diagnosis not present

## 2024-03-14 DIAGNOSIS — N184 Chronic kidney disease, stage 4 (severe): Secondary | ICD-10-CM | POA: Diagnosis not present

## 2024-03-14 DIAGNOSIS — J181 Lobar pneumonia, unspecified organism: Secondary | ICD-10-CM | POA: Diagnosis not present

## 2024-03-14 DIAGNOSIS — Z1629 Resistance to other single specified antibiotic: Secondary | ICD-10-CM | POA: Diagnosis not present

## 2024-03-14 LAB — GLUCOSE, CAPILLARY
Glucose-Capillary: 123 mg/dL — ABNORMAL HIGH (ref 70–99)
Glucose-Capillary: 142 mg/dL — ABNORMAL HIGH (ref 70–99)
Glucose-Capillary: 142 mg/dL — ABNORMAL HIGH (ref 70–99)
Glucose-Capillary: 117 mg/dL — ABNORMAL HIGH (ref 70–99)
Glucose-Capillary: 117 mg/dL — ABNORMAL HIGH (ref 70–99)

## 2024-03-14 LAB — CREATININE, SERUM
Creatinine, Ser: 3.69 mg/dL — ABNORMAL HIGH (ref 0.61–1.24)
GFR, Estimated: 16 mL/min — ABNORMAL LOW

## 2024-03-14 LAB — CBC
HCT: 29.3 % — ABNORMAL LOW (ref 39.0–52.0)
Hemoglobin: 9.4 g/dL — ABNORMAL LOW (ref 13.0–17.0)
MCH: 28.8 pg (ref 26.0–34.0)
MCHC: 32.1 g/dL (ref 30.0–36.0)
MCV: 89.9 fL (ref 80.0–100.0)
Platelets: 493 K/uL — ABNORMAL HIGH (ref 150–400)
RBC: 3.26 MIL/uL — ABNORMAL LOW (ref 4.22–5.81)
RDW: 15.6 % — ABNORMAL HIGH (ref 11.5–15.5)
WBC: 11 K/uL — ABNORMAL HIGH (ref 4.0–10.5)
nRBC: 0 % (ref 0.0–0.2)

## 2024-03-14 LAB — RENAL FUNCTION PANEL
Albumin: 3.1 g/dL — ABNORMAL LOW (ref 3.5–5.0)
Anion gap: 14 (ref 5–15)
BUN: 58 mg/dL — ABNORMAL HIGH (ref 8–23)
CO2: 23 mmol/L (ref 22–32)
Calcium: 11 mg/dL — ABNORMAL HIGH (ref 8.9–10.3)
Chloride: 99 mmol/L (ref 98–111)
Creatinine, Ser: 3.85 mg/dL — ABNORMAL HIGH (ref 0.61–1.24)
GFR, Estimated: 15 mL/min — ABNORMAL LOW
Glucose, Bld: 136 mg/dL — ABNORMAL HIGH (ref 70–99)
Phosphorus: 4.6 mg/dL (ref 2.5–4.6)
Potassium: 3.9 mmol/L (ref 3.5–5.1)
Sodium: 136 mmol/L (ref 135–145)

## 2024-03-14 LAB — PARATHYROID HORMONE, INTACT (NO CA): PTH: 7 pg/mL — ABNORMAL LOW (ref 15–65)

## 2024-03-14 MED ORDER — FUROSEMIDE 10 MG/ML IJ SOLN
40.0000 mg | Freq: Once | INTRAMUSCULAR | Status: AC
  Administered 2024-03-14: 40 mg via INTRAVENOUS
  Filled 2024-03-14: qty 4

## 2024-03-14 NOTE — Plan of Care (Signed)
" °  Problem: Education: Goal: Knowledge of General Education information will improve Description: Including pain rating scale, medication(s)/side effects and non-pharmacologic comfort measures Outcome: Progressing   Problem: Clinical Measurements: Goal: Respiratory complications will improve Outcome: Progressing   Problem: Activity: Goal: Risk for activity intolerance will decrease Outcome: Progressing   Problem: Pain Managment: Goal: General experience of comfort will improve and/or be controlled Outcome: Progressing   Problem: Safety: Goal: Ability to remain free from injury will improve Outcome: Progressing   Problem: Skin Integrity: Goal: Risk for impaired skin integrity will decrease Outcome: Progressing   Problem: Metabolic: Goal: Ability to maintain appropriate glucose levels will improve Outcome: Progressing   "

## 2024-03-14 NOTE — Plan of Care (Signed)
   Problem: Clinical Measurements: Goal: Ability to maintain clinical measurements within normal limits will improve Outcome: Progressing   Problem: Clinical Measurements: Goal: Diagnostic test results will improve Outcome: Progressing   Problem: Activity: Goal: Risk for activity intolerance will decrease Outcome: Progressing

## 2024-03-14 NOTE — Progress Notes (Signed)
 Patient ID: Marcus Simpson, male   DOB: 1945/06/20, 78 y.o.   MRN: [id number]  Assessment/Plan:  AKI/CKD stage IIIb - in setting of poor po intake for more than a week.  Likely pre-renal insult but he has also been on antibiotics for several weeks so AIN is also on the differential diagnosis.  CT stone protocol with proper placement of left JJ ureteral stent, no hydronephrosis.  Started on isotonic bicarb with improvement of BUN/Cr.  He responded to IVF's but with edema + good UOP -> stopped IVF's and follow UOP and Scr closely.   Renal function slowly improving and fortunately no indications for dialysis at this time. Will give a dose of Lasix  today; still edematous. TEDS hose. According to the pt he is on lasix  at home daily but that is not listed as a home med. Avoid nephrotoxic medications including NSAIDs and iodinated intravenous contrast exposure unless the latter is absolutely indicated.   Preferred narcotic agents for pain control are hydromorphone, fentanyl , and methadone. Morphine  should not be used.  Avoid Baclofen and avoid oral sodium phosphate  and magnesium  citrate based laxatives / bowel preps.  Continue strict Input and Output monitoring.  Will monitor the patient closely with you and intervene or adjust therapy as indicated by changes in clinical status/labs  AGMA - due to #1.  Improved with isotonic bicarb.Stopped the IVF's on 12/22.  Multifocal pneumonia - per primary svc and ID Generalized weakness - likely due to #1 and poor po intake for 7-10 days.  Will need PT/OT eval Bilateral nephrolithiasis - s/p left JJ ureteral stent on 03/01/24 and to have it removed on 03/12/24 HTN - stable Anemia of chronic disease MRSE bacteremia - PICC line out and had been on IV daptomycin .  Per ID, zyvox  600 mg bid through 03/20/24 Dm type 2 - per primary  S: No new complaints;  denies sob, appetite not great. Also denies fever, chills, nausea, obstructive like symptoms, myalgias. SABRA O:BP (!)  141/63 (BP Location: Right Arm)   Pulse 96   Temp 97.7 F (36.5 C) (Oral)   Resp 20   Ht 5' 11 (1.803 m)   Wt 97.2 kg   SpO2 92%   BMI 29.89 kg/m   Intake/Output Summary (Last 24 hours) at 03/14/2024 1039 Last data filed at 03/14/2024 0900 Gross per 24 hour  Intake 440 ml  Output 450 ml  Net -10 ml   Intake/Output: I/O last 3 completed shifts: In: 920 [P.O.:920] Out: -   Intake/Output this shift:  Total I/O In: -  Out: 450 [Urine:450] Weight change: -0.9 kg Gen: NAD CVS: RRR Resp: CTA Abd: +BS, soft, NT/ND Ext: trace to 1 + pretibial edema  Recent Labs  Lab 03/07/24 1216 03/08/24 0444 03/09/24 0420 03/10/24 0541 03/11/24 0424 03/12/24 0449 03/13/24 0444 03/14/24 0431 03/14/24 0432  NA 137 137 137 138 137 136 136  --  136  K 4.2 4.7 3.9 4.2 3.9 4.1 4.0  --  3.9  CL 100 97* 98 102 101 99 98  --  99  CO2 16* 25 25 26 24 26 25   --  23  GLUCOSE 97 99 102* 121* 126* 138* 123*  --  136*  BUN 84* 82* 78* 71* 59* 57* 57*  --  58*  CREATININE 6.87* 6.24* 5.67* 5.13* 4.55* 4.22* 4.02* 3.69* 3.85*  ALBUMIN 3.2* 3.0* 2.8*  --   --   --  3.0*  --  3.1*  CALCIUM  10.2 9.8 9.8 9.8 10.0  10.8* 11.0*  --  11.0*  PHOS  --  5.1* 4.8* 4.4  --  4.0 4.3  --  4.6  AST 59* 49* 40  --   --   --   --   --   --   ALT 13 12 12   --   --   --   --   --   --    Liver Function Tests: Recent Labs  Lab 03/07/24 1216 03/08/24 0444 03/09/24 0420 03/13/24 0444 03/14/24 0432  AST 59* 49* 40  --   --   ALT 13 12 12   --   --   ALKPHOS 135* 119 126  --   --   BILITOT 0.9 1.2 1.1  --   --   PROT 8.3* 7.4 7.0  --   --   ALBUMIN 3.2* 3.0* 2.8* 3.0* 3.1*   No results for input(s): LIPASE, AMYLASE in the last 168 hours. No results for input(s): AMMONIA in the last 168 hours. CBC: Recent Labs  Lab 03/07/24 1216 03/08/24 0444 03/09/24 0420 03/10/24 0541 03/11/24 0424 03/12/24 0449 03/14/24 0431  WBC 9.1   < > 5.5 5.8 7.6 8.4 11.0*  NEUTROABS 6.4  --   --   --   --   --   --    HGB 10.9*   < > 9.5* 9.1* 9.2* 9.4* 9.4*  HCT 34.0*   < > 30.2* 29.2* 29.4* 30.1* 29.3*  MCV 89.5   < > 89.6 92.1 91.6 91.5 89.9  PLT 497*   < > 424* 418* 439* 500* 493*   < > = values in this interval not displayed.   Cardiac Enzymes: No results for input(s): CKTOTAL, CKMB, CKMBINDEX, TROPONINI in the last 168 hours. CBG: Recent Labs  Lab 03/13/24 1136 03/13/24 1621 03/13/24 1944 03/14/24 0311 03/14/24 0723  GLUCAP 135* 131* 158* 123* 142*    Iron Studies: No results for input(s): IRON, TIBC, TRANSFERRIN, FERRITIN in the last 72 hours. Studies/Results: No results found.  Chlorhexidine  Gluconate Cloth  6 each Topical Daily   enoxaparin  (LOVENOX ) injection  30 mg Subcutaneous Q24H   insulin  aspart  0-9 Units Subcutaneous TID WC   linezolid   600 mg Oral Q12H   nystatin   5 mL Oral QID   saccharomyces boulardii  250 mg Oral BID   tamsulosin   0.4 mg Oral QPC breakfast    BMET    Component Value Date/Time   NA 136 03/14/2024 0432   K 3.9 03/14/2024 0432   CL 99 03/14/2024 0432   CO2 23 03/14/2024 0432   GLUCOSE 136 (H) 03/14/2024 0432   BUN 58 (H) 03/14/2024 0432   BUN 20 06/29/2022 1354   CREATININE 3.85 (H) 03/14/2024 0432   CALCIUM  11.0 (H) 03/14/2024 0432   GFRNONAA 15 (L) 03/14/2024 0432   GFRAA >60 05/04/2017 0525   CBC    Component Value Date/Time   WBC 11.0 (H) 03/14/2024 0431   RBC 3.26 (L) 03/14/2024 0431   HGB 9.4 (L) 03/14/2024 0431   HCT 29.3 (L) 03/14/2024 0431   PLT 493 (H) 03/14/2024 0431   MCV 89.9 03/14/2024 0431   MCH 28.8 03/14/2024 0431   MCHC 32.1 03/14/2024 0431   RDW 15.6 (H) 03/14/2024 0431   LYMPHSABS 0.9 03/07/2024 1216   MONOABS 1.0 03/07/2024 1216   EOSABS 0.6 (H) 03/07/2024 1216   BASOSABS 0.0 03/07/2024 1216

## 2024-03-14 NOTE — Progress Notes (Signed)
 "          PROGRESS NOTE  Marcus Simpson FMW:[id number] DOB: 03-Feb-1946 DOA: 03/07/2024 PCP: [name], MD  Brief History77  78 year old male with complex past medical history including type 2 diabetes mellitus, hypertension, anemia of chronic disease, stage IIIb CKD, history of prostate cancer, gout, history of left acute pyelonephritis, left ureteral stone status post left ureteral stent placement by Dr. Sherrilee, MRSA bacteremia, Pseudomonas bacteremia, recently discharged with a PICC line and IV daptomycin  for presumed IE since patient refused TEE.  TTE was negative for vegetation.  Unfortunately patient reports that for the past several days he has been feeling unwell with progressive weakness.  He also reports having fever at home.  He pulled his right PICC line out and refused further IV antibiotics.  Home health nursing called infectious disease doctor who recommended that he take Zyvox  600 mg twice daily through end date of 03/20/2024 which was his original end date.  Patient and wife strongly felt that the treatments he was receiving was causing his progressive weakness and debility.  He has had poor oral intake over the past several days, poor appetite and reports that he is supposed to have his left ureteral stent removed by Dr. Sherrilee on Monday, 03/12/2024.  On arrival he was noted to be clinically dehydrated and ill-appearing.  His creatinine had risen to 6.87, up from 1.87 on 02/22/2024.  His hemoglobin is stable at 10.9.  His lactic acid was 2.0.  His CO2 was 16.  His AST was 59, ALT 13, GFR estimated at 8 mL/min.  His urinalysis was positive for hemoglobin, moderate leukocytes protein, rare bacteria 21-50 WBC per high-power field.  CT stone study suggesting consolidation in the lung bases, bilateral renal stones and bladder stones, left ureteral stone in place than previously seen left ureteral stone no longer present.  Patient was given IV fluids and admission requested for AKI  management.   Assessment/Plan: AKI on CKD stage 3b - baseline creatinine 1.8-2.2 -- from poor oral intake from to painful yeast stomatitis  -- improved with IV fluid hydration -- per nephrology ok to DC IVF 12/22 -- DC foley cath today and follow urine output and monitor bladder scans - 12/24--lasix  IV 40 mg x 1 -- follow daily renal function labs--creatinine improved to 3.85   Generalized Weakness -- PT eval requested >>SNF  -- B12 - folate - TSH  Bilateral nephrolithiasis -- no signs of obstruction at this time -- pt was due to have ureteral stent removed by Dr. Sherrilee on 03/12/24 -- notified Dr. Sherrilee and he said that his office will schedule to have stent removed in the office    Aspiration pneumonia - IMPROVING -- ordered CT chest without contrast for further investigation and work up -- 12/18 CT chest confirms multifocal pneumonia  -- completing course of augmentin  on 03/13/24   Hypercalcemia -- mild elevation, asymptomatic -- PTH = 7   Oral Candidiasis--TREATED and IMPROVED  -- fluconazole  ordered and 2 separate doses given -- treated with nystatin  swish  -- he reports much improved symptoms and eating / drinking better    Essential hypertension  -- stable, follow -- resume home meds     MRSE bacteremia -- pt had a PICC line and was on IV daptomycin  until 12/17 when he removed PICC line and refused further IV antibiotics -- the home heath RN had contacted ID physician and the recommendation was to start zyvox  600 mg BID to complete course of  treatment thru 03/20/24   Anemia of chronic disease -- Hg stable from recent testing around 10 -- Hg may trend down from hemodilution s/p IV fluids   Metabolic Acidosis--resolved  -- secondary to worsening renal failure  -- sodium bicarbonate  IV infusion completed    Bacteremia due to pseudomonas-completed treatment  -- pt reports he completed the course of antibiotics for this    Type 2 DM, controlled with  renal complications -- SSI coverage with frequent CBG monitoring ordered - 12/23/23 A1C--6.4  DVT prophylaxis: enoxaparin  Code Status: Full  Family Communication: son and daughters 12/24 Disposition: anticipate SNF rehab    Consultants:  Nephrology    Procedures:    Antimicrobials:  Zyvox  >> (should end 03/20/24) Augmentin  12/19>>12/23          Subjective: Patient denies fevers, chills, headache, chest pain, dyspnea, nausea, vomiting, diarrhea, abdominal pain, dysuria, hematuria, hematochezia, and melena.   Objective: Vitals:   03/14/24 0311 03/14/24 0521 03/14/24 0827 03/14/24 1235  BP: (!) 146/69  (!) 141/63 127/65  Pulse: 95  96 (!) 102  Resp: 18  20 18   Temp: 98.6 F (37 C)  97.7 F (36.5 C) 98 F (36.7 C)  TempSrc:   Oral Oral  SpO2: 91%  92% 94%  Weight:  97.2 kg    Height:        Intake/Output Summary (Last 24 hours) at 03/14/2024 1739 Last data filed at 03/14/2024 0900 Gross per 24 hour  Intake 440 ml  Output 450 ml  Net -10 ml   Weight change: -0.9 kg Exam:  General:  Pt is alert, follows commands appropriately, not in acute distress HEENT: No icterus, No thrush, No neck mass, [place]/AT Cardiovascular: RRR, S1/S2, no rubs, no gallops Respiratory: bibasilar crackles.  No wheeze Abdomen: Soft/+BS, non tender, non distended, no guarding Extremities: 1 + LE edema, No lymphangitis, No petechiae, No rashes, no synovitis   Data Reviewed: I have personally reviewed following labs and imaging studies Basic Metabolic Panel: Recent Labs  Lab 03/08/24 0444 03/09/24 0420 03/10/24 0541 03/11/24 0424 03/12/24 0449 03/13/24 0444 03/14/24 0431 03/14/24 0432  NA 137 137 138 137 136 136  --  136  K 4.7 3.9 4.2 3.9 4.1 4.0  --  3.9  CL 97* 98 102 101 99 98  --  99  CO2 25 25 26 24 26 25   --  23  GLUCOSE 99 102* 121* 126* 138* 123*  --  136*  BUN 82* 78* 71* 59* 57* 57*  --  58*  CREATININE 6.24* 5.67* 5.13* 4.55* 4.22* 4.02* 3.69* 3.85*  CALCIUM  9.8  9.8 9.8 10.0 10.8* 11.0*  --  11.0*  MG 2.1  --   --   --  1.9  --   --   --   PHOS 5.1* 4.8* 4.4  --  4.0 4.3  --  4.6   Liver Function Tests: Recent Labs  Lab 03/08/24 0444 03/09/24 0420 03/13/24 0444 03/14/24 0432  AST 49* 40  --   --   ALT 12 12  --   --   ALKPHOS 119 126  --   --   BILITOT 1.2 1.1  --   --   PROT 7.4 7.0  --   --   ALBUMIN 3.0* 2.8* 3.0* 3.1*   No results for input(s): LIPASE, AMYLASE in the last 168 hours. No results for input(s): AMMONIA in the last 168 hours. Coagulation Profile: No results for input(s): INR, PROTIME in the last  168 hours. CBC: Recent Labs  Lab 03/09/24 0420 03/10/24 0541 03/11/24 0424 03/12/24 0449 03/14/24 0431  WBC 5.5 5.8 7.6 8.4 11.0*  HGB 9.5* 9.1* 9.2* 9.4* 9.4*  HCT 30.2* 29.2* 29.4* 30.1* 29.3*  MCV 89.6 92.1 91.6 91.5 89.9  PLT 424* 418* 439* 500* 493*   Cardiac Enzymes: No results for input(s): CKTOTAL, CKMB, CKMBINDEX, TROPONINI in the last 168 hours. BNP: Invalid input(s): POCBNP CBG: Recent Labs  Lab 03/13/24 1944 03/14/24 0311 03/14/24 0723 03/14/24 1107 03/14/24 1630  GLUCAP 158* 123* 142* 142* 117*   HbA1C: No results for input(s): HGBA1C in the last 72 hours. Urine analysis:    Component Value Date/Time   COLORURINE YELLOW 03/08/2024 0933   APPEARANCEUR CLEAR 03/08/2024 0933   APPEARANCEUR Clear 05/17/2023 1312   LABSPEC 1.011 03/08/2024 0933   PHURINE 6.0 03/08/2024 0933   GLUCOSEU NEGATIVE 03/08/2024 0933   HGBUR LARGE (A) 03/08/2024 0933   BILIRUBINUR NEGATIVE 03/08/2024 0933   BILIRUBINUR Negative 05/17/2023 1312   KETONESUR NEGATIVE 03/08/2024 0933   PROTEINUR 30 (A) 03/08/2024 0933   NITRITE NEGATIVE 03/08/2024 0933   LEUKOCYTESUR MODERATE (A) 03/08/2024 0933   Sepsis Labs: @LABRCNTIP (procalcitonin:4,lacticidven:4) ) Recent Results (from the past 240 hours)  Blood culture (routine x 2)     Status: None   Collection Time: 03/07/24 12:16 PM   Specimen: BLOOD   Result Value Ref Range Status   Specimen Description BLOOD LEFT ANTECUBITAL  Final   Special Requests   Final    BOTTLES DRAWN AEROBIC AND ANAEROBIC Blood Culture adequate volume   Culture   Final    NO GROWTH 5 DAYS Performed at [facility], [address], [place], [place] [address]    Report Status 03/12/2024 FINAL  Final  Blood culture (routine x 2)     Status: None   Collection Time: 03/07/24 12:30 PM   Specimen: BLOOD RIGHT ARM  Result Value Ref Range Status   Specimen Description BLOOD RIGHT ARM  Final   Special Requests   Final    BOTTLES DRAWN AEROBIC ONLY Blood Culture adequate volume   Culture   Final    NO GROWTH 5 DAYS Performed at [facility], [address], [place], [place] [address]    Report Status 03/12/2024 FINAL  Final  Urine Culture     Status: None   Collection Time: 03/07/24 12:46 PM   Specimen: Urine, Random  Result Value Ref Range Status   Specimen Description   Final    URINE, RANDOM Performed at [facility], [address], [place], [place] [address]    Special Requests   Final    NONE Reflexed from [id number] Performed at [facility], [address] [place], [place] [address]    Culture   Final    NO GROWTH Performed at [facility], [address] [place], [place] [address]    Report Status 03/08/2024 FINAL  Final     Scheduled Meds:  Chlorhexidine  Gluconate Cloth  6 each Topical Daily   enoxaparin  (LOVENOX ) injection  30 mg Subcutaneous Q24H   insulin  aspart  0-9 Units Subcutaneous TID WC   linezolid   600 mg Oral Q12H   nystatin   5 mL Oral QID   saccharomyces boulardii  250 mg Oral BID   tamsulosin   0.4 mg Oral QPC breakfast   Continuous Infusions:  Procedures/Studies: CT CHEST WO CONTRAST Result Date: 03/08/2024 EXAM: CT CHEST WITHOUT CONTRAST 03/08/2024 03:22:11 PM TECHNIQUE: CT of the chest was performed without  the administration of intravenous contrast. Multiplanar reformatted images are provided for review. Automated  exposure control, iterative reconstruction, and/or weight based adjustment of the mA/kV was utilized to reduce the radiation dose to as low as reasonably achievable. COMPARISON: CT and x-ray from 03/07/2024 and CT chest 10/30/2023. CLINICAL HISTORY: question of pneumonia (multifocal) FINDINGS: MEDIASTINUM: Heart and pericardium are unremarkable. The central airways are clear. LYMPH NODES: No mediastinal, hilar or axillary lymphadenopathy. LUNGS AND PLEURA: Peribronchovascular and peripheral consolidative opacities diffusely throughout both lungs. No pleural effusion. No pneumothorax. SOFT TISSUES/BONES: No acute abnormality of the bones or soft tissues. UPPER ABDOMEN: Limited images of the upper abdomen demonstrate bilateral nonobstructing nephrolithiasis. IMPRESSION: 1. Peribronchovascular and peripheral consolidative opacities diffusely throughout both lungs, most consistent with multifocal pneumonia; differential includes but is not limited to: organizing pneumonia, aspiration pneumonitis, and drug/toxin-related pneumonitis. Prominent Electronically signed by: Norman Gatlin MD 03/08/2024 08:04 PM EST RP Workstation: [id number]   CT Renal Stone Study Result Date: 03/07/2024 CLINICAL DATA:  Abdominal/flank pain.  Weakness anorexia. EXAM: CT ABDOMEN AND PELVIS WITHOUT CONTRAST TECHNIQUE: Multidetector CT imaging of the abdomen and pelvis was performed following the standard protocol without IV contrast. RADIATION DOSE REDUCTION: This exam was performed according to the departmental dose-optimization program which includes automated exposure control, adjustment of the mA and/or kV according to patient size and/or use of iterative reconstruction technique. COMPARISON:  02/18/2024. FINDINGS: Lower chest: Severe patchy bilateral airspace consolidation in the lung bases, incompletely visualized. Heart is mildly enlarged. No pericardial or pleural effusion. Distal esophagus is grossly unremarkable. Hepatobiliary:  Liver and gallbladder are unremarkable. No biliary ductal dilatation. Pancreas: Negative. Spleen: Negative. Adrenals/Urinary Tract: Adrenal glands are unremarkable. Bilateral renal stones. Low-attenuation lesions in the kidneys. No specific follow-up necessary. Right ureter is decompressed. Double-J left ureteral stent in place with the proximal loop in the left renal pelvis and distal loop in the bladder. Minimal left ureteral prominence, similar to 02/18/2024. Previously seen mid left ureteral stone is no longer visualized. Tiny dependent stones in the bladder. Locules of non dependent air are presumably iatrogenic in etiology. Stomach/Bowel: Stomach, small bowel, appendix and colon are unremarkable. Vascular/Lymphatic: Atherosclerotic calcification of the aorta. No pathologically enlarged lymph nodes. Reproductive: Brachytherapy seeds in the prostate. Other: No free fluid.  Mesenteries and peritoneum are unremarkable. Musculoskeletal: Degenerative changes in the spine. Osteopenia. Minimal grade 1 anterolisthesis of L4 on L5 and L5 on S1. Old T12 and L1 compression fractures. IMPRESSION: 1. Multifocal consolidation in the lung bases, indicative of pneumonia. Organizing pneumonia is not excluded. Please correlate clinically. Findings are only partially imaged. Consider CT chest without contrast in further evaluation, as clinically indicated. 2. Bilateral renal stones and tiny bladder stones. Double-J left ureteral stent in place with minimal prominence of the left ureter, unchanged. Previously seen mid left ureteral stone is no longer visualized. 3.  Aortic atherosclerosis (ICD10-I70.0). Electronically Signed   By: Newell Eke M.D.   On: 03/07/2024 14:37   DG Chest Port 1 View Result Date: 03/07/2024 CLINICAL DATA:  Worsening weakness over the past several days. Possible sepsis. EXAM: PORTABLE CHEST 1 VIEW COMPARISON:  02/18/2024 FINDINGS: Lungs are hypoinflated with patchy bilateral airspace opacification  likely multifocal pneumonia as this is slightly worse. Possible small amount left pleural fluid unchanged. Cardiomediastinal silhouette and remainder of the exam is unchanged. IMPRESSION: Slight worsening patchy bilateral airspace process likely multifocal pneumonia. Possible small amount left pleural fluid unchanged. Electronically Signed   By: Toribio Agreste M.D.   On: 03/07/2024 13:19  DG C-Arm 1-60 Min-No Report Result Date: 03/01/2024 Fluoroscopy was utilized by the requesting physician.  No radiographic interpretation.   US  EKG SITE RITE Result Date: 02/23/2024 If Site Rite image not attached, placement could not be confirmed due to current cardiac rhythm.  ECHOCARDIOGRAM LIMITED Result Date: 02/19/2024    ECHOCARDIOGRAM LIMITED REPORT   Patient Name:   CARREL LEATHER Kendall Date of Exam: 02/19/2024 Medical Rec #:  [id number]         Height:       71.0 in Accession #:    [id number]        Weight:       238.8 lb Date of Birth:  October 03, 1945          BSA:          2.273 m Patient Age:    78 years          BP:           121/52 mmHg Patient Gender: M                 HR:           70 bpm. Exam Location:  Inpatient Procedure: Limited Echo, Limited Color Doppler and Cardiac Doppler (Both            Spectral and Color Flow Doppler were utilized during procedure). Indications:    Sepsis  History:        Patient has prior history of Echocardiogram examinations, most                 recent 02/07/2024. Risk Factors:Hypertension, Diabetes and                 Dyslipidemia.  Sonographer:    Tinnie Barefoot RDCS Referring Phys: [id number] JAMIE H DAGENHART IMPRESSIONS  1. Left ventricular ejection fraction, by estimation, is 60 to 65%. The left ventricle has normal function. The left ventricle has no regional wall motion abnormalities.  2. Right ventricular systolic function is normal. The right ventricular size is normal. There is normal pulmonary artery systolic pressure. The estimated right ventricular systolic pressure  is 28.0 mmHg.  3. The mitral valve is normal in structure. Trivial mitral valve regurgitation. No evidence of mitral stenosis.  4. The aortic valve is tricuspid. Aortic valve regurgitation is not visualized. Aortic valve sclerosis is present, with no evidence of aortic valve stenosis.  5. The inferior vena cava is normal in size with greater than 50% respiratory variability, suggesting right atrial pressure of 3 mmHg. FINDINGS  Left Ventricle: Left ventricular ejection fraction, by estimation, is 60 to 65%. The left ventricle has normal function. The left ventricle has no regional wall motion abnormalities. The left ventricular internal cavity size was normal in size. There is  no left ventricular hypertrophy. Right Ventricle: The right ventricular size is normal. No increase in right ventricular wall thickness. Right ventricular systolic function is normal. There is normal pulmonary artery systolic pressure. The tricuspid regurgitant velocity is 2.50 m/s, and  with an assumed right atrial pressure of 3 mmHg, the estimated right ventricular systolic pressure is 28.0 mmHg. Left Atrium: Left atrial size was normal in size. Right Atrium: Right atrial size was normal in size. Pericardium: There is no evidence of pericardial effusion. Mitral Valve: The mitral valve is normal in structure. Trivial mitral valve regurgitation. No evidence of mitral valve stenosis. Tricuspid Valve: The tricuspid valve is normal in structure. Tricuspid valve regurgitation is trivial. No evidence of tricuspid stenosis. Aortic  Valve: The aortic valve is tricuspid. Aortic valve regurgitation is not visualized. Aortic valve sclerosis is present, with no evidence of aortic valve stenosis. Pulmonic Valve: The pulmonic valve was normal in structure. Pulmonic valve regurgitation is trivial. No evidence of pulmonic stenosis. Aorta: The aortic root is normal in size and structure. Venous: The inferior vena cava is normal in size with greater than 50%  respiratory variability, suggesting right atrial pressure of 3 mmHg. IAS/Shunts: No atrial level shunt detected by color flow Doppler. Additional Comments: Spectral Doppler performed. Color Doppler performed.  LEFT VENTRICLE PLAX 2D LVIDd:         3.70 cm LVIDs:         2.40 cm LV PW:         1.00 cm LV IVS:        1.10 cm  IVC IVC diam: 1.90 cm LEFT ATRIUM         Index LA diam:    3.60 cm 1.58 cm/m  AORTIC VALVE LVOT Vmax:   97.60 cm/s LVOT Vmean:  65.300 cm/s LVOT VTI:    0.233 m MITRAL VALVE               TRICUSPID VALVE MV Area (PHT): 2.34 cm    TR Peak grad:   25.0 mmHg MV Decel Time: 324 msec    TR Vmax:        250.00 cm/s MV E velocity: 72.40 cm/s MV A velocity: 69.70 cm/s  SHUNTS MV E/A ratio:  1.04        Systemic VTI: 0.23 m Wilbert Bihari MD Electronically signed by Wilbert Bihari MD Signature Date/Time: 02/19/2024/11:15:39 AM    Final    DG Abd 1 View Result Date: 02/18/2024 EXAM: 1 VIEW XRAY OF THE ABDOMEN 02/18/2024 10:58:00 PM COMPARISON: 02/18/2024 CLINICAL HISTORY: Abdominal distention FINDINGS: BOWEL: Nonobstructive bowel gas pattern. SOFT TISSUES: Left ureteral stent in place. Prostate brachytherapy seeds noted. 7 mm left mid ureteral calculus adjacent to the stent. 9 mm calculus projects over the right kidney. BONES: No acute osseous abnormality. IMPRESSION: 1. 7 mm left mid ureteral calculus adjacent to the stent. 2. 9 mm calculus projects over the right kidney. Electronically signed by: Norman Gatlin MD 02/18/2024 11:05 PM EST RP Workstation: [id number]   CT ABDOMEN PELVIS WO CONTRAST Result Date: 02/18/2024 CLINICAL DATA:  Left upper quadrant pain. EXAM: CT ABDOMEN AND PELVIS WITHOUT CONTRAST TECHNIQUE: Multidetector CT imaging of the abdomen and pelvis was performed following the standard protocol without IV contrast. RADIATION DOSE REDUCTION: This exam was performed according to the departmental dose-optimization program which includes automated exposure control, adjustment of the mA  and/or kV according to patient size and/or use of iterative reconstruction technique. COMPARISON:  CT abdomen and pelvis 02/05/2024. FINDINGS: Lower chest: No acute abnormality. Hepatobiliary: No focal liver abnormality is seen. No gallstones, gallbladder wall thickening, or biliary dilatation. Pancreas: Unremarkable. No pancreatic ductal dilatation or surrounding inflammatory changes. Spleen: Normal in size without focal abnormality. Adrenals/Urinary Tract: There is a new left ureteral stent in place in proper position. There is no left-sided hydronephrosis. Stable position of calculus in the left mid ureter measuring 5 mm. Bilateral renal calculi are otherwise stable. Bilateral renal cysts have not significantly changed. Adrenal glands are within normal limits. There is diffuse bladder wall thickening versus normal under distension. Stomach/Bowel: Stomach is within normal limits. No evidence of bowel wall thickening, distention, or inflammatory changes. The appendix is not visualized. Vascular/Lymphatic: Aortic atherosclerosis. No enlarged abdominal or pelvic lymph  nodes. There are few nonenlarged left retroperitoneal lymph nodes, unchanged. Reproductive: Prostate radiotherapy seeds are present. Other: There are small fat containing inguinal hernias. There is no ascites. Musculoskeletal: The bones are osteopenic. Chronic compression deformities of T12 and L1 appear unchanged. IMPRESSION: 1. New left ureteral stent in place. No hydronephrosis. 2. Stable position of 5 mm calculus in the left mid ureter. 3. Stable bilateral renal calculi. 4. Diffuse bladder wall thickening versus normal under distension. Correlate clinically for cystitis. 5. Aortic atherosclerosis. Aortic Atherosclerosis (ICD10-I70.0). Electronically Signed   By: Greig Pique M.D.   On: 02/18/2024 19:54   DG Chest Port 1 View Result Date: 02/18/2024 CLINICAL DATA:  Sepsis EXAM: PORTABLE CHEST 1 VIEW COMPARISON:  Chest x-ray 02/05/2024 FINDINGS:  Right upper extremity PICC terminates in the SVC. The heart size and mediastinal contours are within normal limits. Both lungs are clear. The visualized skeletal structures are unremarkable. IMPRESSION: No active disease. Electronically Signed   By: Greig Pique M.D.   On: 02/18/2024 18:42    Alm Schneider, DO  [facility]  If 7PM-7AM, please contact night-coverage [email or web address] Password [facility] 03/14/2024, 5:39 PM   LOS: 7 days   "

## 2024-03-15 ENCOUNTER — Inpatient Hospital Stay (HOSPITAL_COMMUNITY)

## 2024-03-15 DIAGNOSIS — N179 Acute kidney failure, unspecified: Secondary | ICD-10-CM | POA: Diagnosis not present

## 2024-03-15 DIAGNOSIS — B957 Other staphylococcus as the cause of diseases classified elsewhere: Secondary | ICD-10-CM | POA: Diagnosis not present

## 2024-03-15 DIAGNOSIS — N184 Chronic kidney disease, stage 4 (severe): Secondary | ICD-10-CM

## 2024-03-15 DIAGNOSIS — J690 Pneumonitis due to inhalation of food and vomit: Secondary | ICD-10-CM | POA: Diagnosis not present

## 2024-03-15 DIAGNOSIS — R7881 Bacteremia: Secondary | ICD-10-CM | POA: Diagnosis not present

## 2024-03-15 LAB — RENAL FUNCTION PANEL
Albumin: 3.2 g/dL — ABNORMAL LOW (ref 3.5–5.0)
Anion gap: 15 (ref 5–15)
BUN: 56 mg/dL — ABNORMAL HIGH (ref 8–23)
CO2: 25 mmol/L (ref 22–32)
Calcium: 10.8 mg/dL — ABNORMAL HIGH (ref 8.9–10.3)
Chloride: 99 mmol/L (ref 98–111)
Creatinine, Ser: 3.8 mg/dL — ABNORMAL HIGH (ref 0.61–1.24)
GFR, Estimated: 15 mL/min — ABNORMAL LOW
Glucose, Bld: 131 mg/dL — ABNORMAL HIGH (ref 70–99)
Phosphorus: 4.8 mg/dL — ABNORMAL HIGH (ref 2.5–4.6)
Potassium: 4.1 mmol/L (ref 3.5–5.1)
Sodium: 138 mmol/L (ref 135–145)

## 2024-03-15 LAB — GLUCOSE, CAPILLARY
Glucose-Capillary: 143 mg/dL — ABNORMAL HIGH (ref 70–99)
Glucose-Capillary: 126 mg/dL — ABNORMAL HIGH (ref 70–99)
Glucose-Capillary: 111 mg/dL — ABNORMAL HIGH (ref 70–99)
Glucose-Capillary: 141 mg/dL — ABNORMAL HIGH (ref 70–99)
Glucose-Capillary: 124 mg/dL — ABNORMAL HIGH (ref 70–99)

## 2024-03-15 LAB — VITAMIN B12: Vitamin B-12: >4000 pg/mL — ABNORMAL HIGH (ref 180–914)

## 2024-03-15 LAB — CBC
HCT: 29.7 % — ABNORMAL LOW (ref 39.0–52.0)
Hemoglobin: 9.4 g/dL — ABNORMAL LOW (ref 13.0–17.0)
MCH: 28.5 pg (ref 26.0–34.0)
MCHC: 31.6 g/dL (ref 30.0–36.0)
MCV: 90 fL (ref 80.0–100.0)
Platelets: 453 K/uL — ABNORMAL HIGH (ref 150–400)
RBC: 3.3 MIL/uL — ABNORMAL LOW (ref 4.22–5.81)
RDW: 15.7 % — ABNORMAL HIGH (ref 11.5–15.5)
WBC: 11.4 K/uL — ABNORMAL HIGH (ref 4.0–10.5)
nRBC: 0 % (ref 0.0–0.2)

## 2024-03-15 LAB — T4, FREE: Free T4: 1.14 ng/dL (ref 0.80–2.00)

## 2024-03-15 LAB — TSH: TSH: 2.6 u[IU]/mL (ref 0.350–4.500)

## 2024-03-15 LAB — FOLATE: Folate: 5.5 ng/mL — ABNORMAL LOW

## 2024-03-15 MED ORDER — ACETAMINOPHEN 500 MG PO TABS
1000.0000 mg | ORAL_TABLET | Freq: Three times a day (TID) | ORAL | Status: DC
  Administered 2024-03-15 – 2024-03-17 (×5): 1000 mg via ORAL
  Filled 2024-03-15 (×6): qty 2

## 2024-03-15 MED ORDER — FOLIC ACID 1 MG PO TABS
1.0000 mg | ORAL_TABLET | Freq: Every day | ORAL | Status: DC
  Administered 2024-03-15 – 2024-03-17 (×3): 1 mg via ORAL
  Filled 2024-03-15 (×3): qty 1

## 2024-03-15 NOTE — Progress Notes (Signed)
 Patient ID: Marcus Simpson, male   DOB: 30-Dec-1945, 78 y.o.   MRN: [id number]  Assessment/Plan:  AKI/CKD stage IIIb - in setting of poor po intake for more than a week.  Likely pre-renal insult but he has also been on antibiotics for several weeks so AIN is also on the differential diagnosis.  CT stone protocol with proper placement of left JJ ureteral stent, no hydronephrosis.  Started on isotonic bicarb with improvement of BUN/Cr.  He responded to IVF's but with edema + good UOP -> stopped IVF's and follow UOP and Scr closely.   Renal function slowly improving and fortunately no indications for dialysis at this time. Gave a dose of Lasix  on 12/24 with great UOP; still edematous. TEDS hose. According to the pt he is on lasix  at home daily but that is not listed as a home med. +365mL during this hospitalization. Will hold on further Lasix  today; edema is actually markedly improved. Avoid nephrotoxic medications including NSAIDs and iodinated intravenous contrast exposure unless the latter is absolutely indicated.   Preferred narcotic agents for pain control are hydromorphone, fentanyl , and methadone. Morphine  should not be used.  Avoid Baclofen and avoid oral sodium phosphate  and magnesium  citrate based laxatives / bowel preps.  Continue strict Input and Output monitoring.  Will monitor the patient closely with you and intervene or adjust therapy as indicated by changes in clinical status/labs  AGMA - due to #1.  Improved with isotonic bicarb.Stopped the IVF's on 12/22.  Multifocal pneumonia - per primary svc and ID Generalized weakness - likely due to #1 and poor po intake for 7-10 days.  Will need PT/OT eval Bilateral nephrolithiasis - s/p left JJ ureteral stent on 03/01/24 and to have it removed on 03/12/24 HTN - stable Anemia of chronic disease MRSE bacteremia - PICC line out and had been on IV daptomycin .  Per ID, zyvox  600 mg bid through 03/20/24 Dm type 2 - per primary  S: No new  complaints;  denies sob, appetite still not great. Also denies fever, chills, nausea, obstructive like symptoms, myalgias.  SABRA O:BP 131/81 (BP Location: Left Arm)   Pulse 99   Temp 98.3 F (36.8 C)   Resp 18   Ht 5' 11 (1.803 m)   Wt 87.1 kg   SpO2 93%   BMI 26.78 kg/m   Intake/Output Summary (Last 24 hours) at 03/15/2024 0942 Last data filed at 03/15/2024 0322 Gross per 24 hour  Intake 340 ml  Output 1500 ml  Net -1160 ml   Intake/Output: I/O last 3 completed shifts: In: 780 [P.O.:780] Out: 1950 [Urine:1950]  Intake/Output this shift:  No intake/output data recorded. Weight change: -10.1 kg Gen: NAD CVS: RRR Resp: CTA Abd: +BS, soft, NT/ND Ext: trace pretibial edema  Recent Labs  Lab 03/09/24 0420 03/10/24 0541 03/11/24 0424 03/12/24 0449 03/13/24 0444 03/14/24 0431 03/14/24 0432 03/15/24 0438  NA 137 138 137 136 136  --  136 138  K 3.9 4.2 3.9 4.1 4.0  --  3.9 4.1  CL 98 102 101 99 98  --  99 99  CO2 25 26 24 26 25   --  23 25  GLUCOSE 102* 121* 126* 138* 123*  --  136* 131*  BUN 78* 71* 59* 57* 57*  --  58* 56*  CREATININE 5.67* 5.13* 4.55* 4.22* 4.02* 3.69* 3.85* 3.80*  ALBUMIN 2.8*  --   --   --  3.0*  --  3.1* 3.2*  CALCIUM  9.8 9.8 10.0 10.8*  11.0*  --  11.0* 10.8*  PHOS 4.8* 4.4  --  4.0 4.3  --  4.6 4.8*  AST 40  --   --   --   --   --   --   --   ALT 12  --   --   --   --   --   --   --    Liver Function Tests: Recent Labs  Lab 03/09/24 0420 03/13/24 0444 03/14/24 0432 03/15/24 0438  AST 40  --   --   --   ALT 12  --   --   --   ALKPHOS 126  --   --   --   BILITOT 1.1  --   --   --   PROT 7.0  --   --   --   ALBUMIN 2.8* 3.0* 3.1* 3.2*   No results for input(s): LIPASE, AMYLASE in the last 168 hours. No results for input(s): AMMONIA in the last 168 hours. CBC: Recent Labs  Lab 03/10/24 0541 03/11/24 0424 03/12/24 0449 03/14/24 0431 03/15/24 0438  WBC 5.8 7.6 8.4 11.0* 11.4*  HGB 9.1* 9.2* 9.4* 9.4* 9.4*  HCT 29.2* 29.4*  30.1* 29.3* 29.7*  MCV 92.1 91.6 91.5 89.9 90.0  PLT 418* 439* 500* 493* 453*   Cardiac Enzymes: No results for input(s): CKTOTAL, CKMB, CKMBINDEX, TROPONINI in the last 168 hours. CBG: Recent Labs  Lab 03/14/24 1107 03/14/24 1630 03/14/24 2003 03/15/24 0307 03/15/24 0712  GLUCAP 142* 117* 117* 143* 126*    Iron Studies: No results for input(s): IRON, TIBC, TRANSFERRIN, FERRITIN in the last 72 hours. Studies/Results: No results found.  Chlorhexidine  Gluconate Cloth  6 each Topical Daily   enoxaparin  (LOVENOX ) injection  30 mg Subcutaneous Q24H   folic acid   1 mg Oral Daily   insulin  aspart  0-9 Units Subcutaneous TID WC   linezolid   600 mg Oral Q12H   nystatin   5 mL Oral QID   saccharomyces boulardii  250 mg Oral BID   tamsulosin   0.4 mg Oral QPC breakfast    BMET    Component Value Date/Time   NA 138 03/15/2024 0438   K 4.1 03/15/2024 0438   CL 99 03/15/2024 0438   CO2 25 03/15/2024 0438   GLUCOSE 131 (H) 03/15/2024 0438   BUN 56 (H) 03/15/2024 0438   BUN 20 06/29/2022 1354   CREATININE 3.80 (H) 03/15/2024 0438   CALCIUM  10.8 (H) 03/15/2024 0438   GFRNONAA 15 (L) 03/15/2024 0438   GFRAA >60 05/04/2017 0525   CBC    Component Value Date/Time   WBC 11.4 (H) 03/15/2024 0438   RBC 3.30 (L) 03/15/2024 0438   HGB 9.4 (L) 03/15/2024 0438   HCT 29.7 (L) 03/15/2024 0438   PLT 453 (H) 03/15/2024 0438   MCV 90.0 03/15/2024 0438   MCH 28.5 03/15/2024 0438   MCHC 31.6 03/15/2024 0438   RDW 15.7 (H) 03/15/2024 0438   LYMPHSABS 0.9 03/07/2024 1216   MONOABS 1.0 03/07/2024 1216   EOSABS 0.6 (H) 03/07/2024 1216   BASOSABS 0.0 03/07/2024 1216

## 2024-03-15 NOTE — Plan of Care (Signed)
" °  Problem: Education: Goal: Knowledge of General Education information will improve Description: Including pain rating scale, medication(s)/side effects and non-pharmacologic comfort measures Outcome: Progressing   Problem: Clinical Measurements: Goal: Respiratory complications will improve Outcome: Progressing   Problem: Pain Managment: Goal: General experience of comfort will improve and/or be controlled Outcome: Progressing   Problem: Safety: Goal: Ability to remain free from injury will improve Outcome: Progressing   Problem: Skin Integrity: Goal: Risk for impaired skin integrity will decrease Outcome: Progressing   Problem: Metabolic: Goal: Ability to maintain appropriate glucose levels will improve Outcome: Progressing   "

## 2024-03-15 NOTE — Plan of Care (Signed)

## 2024-03-15 NOTE — Progress Notes (Signed)
 "          PROGRESS NOTE  Marcus Simpson FMW:[id number] DOB: August 30, 1945 DOA: 03/07/2024 PCP: Marcus Leta NOVAK, MD  Brief History71  78 year old male with complex past medical history including type 2 diabetes mellitus, hypertension, anemia of chronic disease, stage IIIb CKD, history of prostate cancer, gout, history of left acute pyelonephritis, left ureteral stone status post left ureteral stent placement by Marcus Simpson, MRSA bacteremia, Pseudomonas bacteremia, recently discharged with a PICC line and IV daptomycin  for presumed IE since patient refused TEE.  TTE was negative for vegetation.  Unfortunately patient reports that for the past several days he has been feeling unwell with progressive weakness.  He also reports having fever at home.  He pulled his right PICC line out and refused further IV antibiotics.  Home health nursing called infectious disease doctor who recommended that he take Zyvox  600 mg twice daily through end date of 03/20/2024 which was his original end date.  Patient and wife strongly felt that the treatments he was receiving was causing his progressive weakness and debility.  He has had poor oral intake over the past several days, poor appetite and reports that he is supposed to have his left ureteral stent removed by Marcus Simpson on Monday, 03/12/2024.  On arrival he was noted to be clinically dehydrated and ill-appearing.  His creatinine had risen to 6.87, up from 1.87 on 02/22/2024.  His hemoglobin is stable at 10.9.  His lactic acid was 2.0.  His CO2 was 16.  His AST was 59, ALT 13, GFR estimated at 8 mL/min.  His urinalysis was positive for hemoglobin, moderate leukocytes protein, rare bacteria 21-50 WBC per high-power field.  CT stone study suggesting consolidation in the lung bases, bilateral renal stones and bladder stones, left ureteral stone in place than previously seen left ureteral stone no longer present.  Patient was given IV fluids and admission requested for AKI  management.   Assessment/Plan: AKI on CKD stage 3b - baseline creatinine 1.8-2.2 -- from poor oral intake from to painful yeast stomatitis  -- improved with IV fluid hydration -- per nephrology ok to DC IVF 12/22 -- DC foley cath today and follow urine output and monitor bladder scans - 12/24--lasix  IV 40 mg x 1 -- follow daily renal function labs--creatinine improved to 3.85>>3.80   Generalized Weakness/Acute metabolic Encephalopathy -- PT eval requested >>SNF  -- B12-->4000 - folate 5.5>> replete - TSH--2.660 - d/c fentanyl , oxycodone , trazodone , benadryl  - use acetaminophen  only for pain - repeat UA/culture   Bilateral nephrolithiasis -- 12/17 CT abd/pelvis--Bilateral renal stones and tiny bladder stones. Double-J leftureteral stent in place with minimal prominence of the left ureter, unchanged. Previously seen mid left ureteral stone is no longervisualized. -- pt was due to have ureteral stent removed by Marcus Simpson on 03/12/24 -- notified Marcus Simpson and he said that his office will schedule to have stent removed in the office    Aspiration pneumonia - IMPROVING -- ordered CT chest without contrast for further investigation and work up -- 12/18 CT chest confirms multifocal pneumonia  -- completing course of augmentin  on 03/13/24   Hypercalcemia -- mild elevation, asymptomatic -- PTH = 7     Oral Candidiasis--TREATED and IMPROVED  -- fluconazole  ordered and 2 separate doses given -- treated with nystatin  swish  -- he reports much improved symptoms and eating / drinking better    Essential hypertension  -- stable, follow -- resume home meds     MRSE bacteremia --  pt had a PICC line and was on IV daptomycin  until 12/17 when he removed PICC line and refused further IV antibiotics -- the home heath RN had contacted ID physician and the recommendation was to start zyvox  600 mg BID to complete course of treatment thru 03/20/24   Anemia of chronic disease -- Hg stable  from recent testing around 10 -- Hg may trend down from hemodilution s/p IV fluids   Metabolic Acidosis--resolved  -- secondary to worsening renal failure  -- sodium bicarbonate  IV infusion completed    Bacteremia due to pseudomonas-completed treatment  -- pt reports he completed the course of antibiotics for this    Type 2 DM, controlled with renal complications -- SSI coverage with frequent CBG monitoring ordered - 12/23/23 A1C--6.4   DVT prophylaxis: enoxaparin  Code Status: Full  Family Communication: son and daughters 12/24 Disposition: anticipate SNF rehab    Consultants:  Nephrology    Procedures:    Antimicrobials:  Zyvox  >> (should end 03/20/24) Augmentin  12/19>>12/23        Subjective: Patient denies fevers, chills, headache, chest pain, dyspnea, nausea, vomiting, diarrhea, abdominal pain, dysuria,   Objective: Vitals:   03/14/24 2002 03/15/24 0306 03/15/24 0323 03/15/24 1239  BP: (!) 155/65 131/81  129/78  Pulse: 95 99  80  Resp: 18 18  18   Temp: 98.3 F (36.8 C) 98.3 F (36.8 C)  98.5 F (36.9 C)  TempSrc:    Oral  SpO2: 92% 93%  94%  Weight:   87.1 kg   Height:        Intake/Output Summary (Last 24 hours) at 03/15/2024 1402 Last data filed at 03/15/2024 0322 Gross per 24 hour  Intake 340 ml  Output 1500 ml  Net -1160 ml   Weight change: -10.1 kg Exam:  General:  Pt is alert, follows commands appropriately, not in acute distress HEENT: No icterus, No thrush, No neck mass, [facility] Marcus Simpson Cardiovascular: RRR, S1/S2, no rubs, no gallops Respiratory: fine bibasilar rales. No wheeze Abdomen: Soft/+BS, non tender, non distended, no guarding Extremities: trace LE edema, No lymphangitis, No petechiae, No rashes, no synovitis   Data Reviewed: I have personally reviewed following labs and imaging studies Basic Metabolic Panel: Recent Labs  Lab 03/10/24 0541 03/11/24 0424 03/12/24 0449 03/13/24 0444 03/14/24 0431 03/14/24 0432 03/15/24 0438  NA  138 137 136 136  --  136 138  K 4.2 3.9 4.1 4.0  --  3.9 4.1  CL 102 101 99 98  --  99 99  CO2 26 24 26 25   --  23 25  GLUCOSE 121* 126* 138* 123*  --  136* 131*  BUN 71* 59* 57* 57*  --  58* 56*  CREATININE 5.13* 4.55* 4.22* 4.02* 3.69* 3.85* 3.80*  CALCIUM  9.8 10.0 10.8* 11.0*  --  11.0* 10.8*  MG  --   --  1.9  --   --   --   --   PHOS 4.4  --  4.0 4.3  --  4.6 4.8*   Liver Function Tests: Recent Labs  Lab 03/09/24 0420 03/13/24 0444 03/14/24 0432 03/15/24 0438  AST 40  --   --   --   ALT 12  --   --   --   ALKPHOS 126  --   --   --   BILITOT 1.1  --   --   --   PROT 7.0  --   --   --   ALBUMIN 2.8* 3.0* 3.1*  3.2*   No results for input(s): LIPASE, AMYLASE in the last 168 hours. No results for input(s): AMMONIA in the last 168 hours. Coagulation Profile: No results for input(s): INR, PROTIME in the last 168 hours. CBC: Recent Labs  Lab 03/10/24 0541 03/11/24 0424 03/12/24 0449 03/14/24 0431 03/15/24 0438  WBC 5.8 7.6 8.4 11.0* 11.4*  HGB 9.1* 9.2* 9.4* 9.4* 9.4*  HCT 29.2* 29.4* 30.1* 29.3* 29.7*  MCV 92.1 91.6 91.5 89.9 90.0  PLT 418* 439* 500* 493* 453*   Cardiac Enzymes: No results for input(s): CKTOTAL, CKMB, CKMBINDEX, TROPONINI in the last 168 hours. BNP: Invalid input(s): POCBNP CBG: Recent Labs  Lab 03/14/24 1630 03/14/24 2003 03/15/24 0307 03/15/24 0712 03/15/24 1118  GLUCAP 117* 117* 143* 126* 111*   HbA1C: No results for input(s): HGBA1C in the last 72 hours. Urine analysis:    Component Value Date/Time   COLORURINE YELLOW 03/08/2024 0933   APPEARANCEUR CLEAR 03/08/2024 0933   APPEARANCEUR Clear 05/17/2023 1312   LABSPEC 1.011 03/08/2024 0933   PHURINE 6.0 03/08/2024 0933   GLUCOSEU NEGATIVE 03/08/2024 0933   HGBUR LARGE (A) 03/08/2024 0933   BILIRUBINUR NEGATIVE 03/08/2024 0933   BILIRUBINUR Negative 05/17/2023 1312   KETONESUR NEGATIVE 03/08/2024 0933   PROTEINUR 30 (A) 03/08/2024 0933   NITRITE NEGATIVE  03/08/2024 0933   LEUKOCYTESUR MODERATE (A) 03/08/2024 0933   Sepsis Labs: @LABRCNTIP (procalcitonin:4,lacticidven:4) ) Recent Results (from the past 240 hours)  Blood culture (routine x 2)     Status: None   Collection Time: 03/07/24 12:16 PM   Specimen: BLOOD  Result Value Ref Range Status   Specimen Description BLOOD LEFT ANTECUBITAL  Final   Special Requests   Final    BOTTLES DRAWN AEROBIC AND ANAEROBIC Blood Culture adequate volume   Culture   Final    NO GROWTH 5 DAYS Performed at [facility], [address], [place], [place] [address]    Report Status 03/12/2024 FINAL  Final  Blood culture (routine x 2)     Status: None   Collection Time: 03/07/24 12:30 PM   Specimen: BLOOD RIGHT ARM  Result Value Ref Range Status   Specimen Description BLOOD RIGHT ARM  Final   Special Requests   Final    BOTTLES DRAWN AEROBIC ONLY Blood Culture adequate volume   Culture   Final    NO GROWTH 5 DAYS Performed at [facility], [address] [place], [place] [address]    Report Status 03/12/2024 FINAL  Final  Urine Culture     Status: None   Collection Time: 03/07/24 12:46 PM   Specimen: Urine, Random  Result Value Ref Range Status   Specimen Description   Final    URINE, RANDOM Performed at [facility], [address], [place], [place] [address]    Special Requests   Final    NONE Reflexed from [id number] Performed at [facility], [address] [place], [place] [address]    Culture   Final    NO GROWTH Performed at [facility], [address], [place], [place] [address]    Report Status 03/08/2024 FINAL  Final     Scheduled Meds:  acetaminophen   1,000 mg Oral Q8H   Chlorhexidine  Gluconate Cloth  6 each Topical Daily   enoxaparin  (LOVENOX ) injection  30 mg Subcutaneous Q24H   folic acid   1 mg Oral Daily   insulin  aspart  0-9 Units Subcutaneous TID WC   linezolid   600 mg Oral Q12H   nystatin   5 mL Oral QID   tamsulosin   0.4 mg Oral QPC breakfast   Continuous  Infusions:  Procedures/Studies: CT CHEST WO CONTRAST Result Date: 03/08/2024 EXAM: CT CHEST WITHOUT CONTRAST 03/08/2024 03:22:11 PM TECHNIQUE: CT of the chest was performed without the administration of intravenous contrast. Multiplanar reformatted images are provided for review. Automated exposure control, iterative reconstruction, and/or weight based adjustment of the mA/kV was utilized to reduce the radiation dose to as low as reasonably achievable. COMPARISON: CT and x-ray from 03/07/2024 and CT chest 10/30/2023. CLINICAL HISTORY: question of pneumonia (multifocal) FINDINGS: MEDIASTINUM: Heart and pericardium are unremarkable. The central airways are clear. LYMPH NODES: No mediastinal, hilar or axillary lymphadenopathy. LUNGS AND PLEURA: Peribronchovascular and peripheral consolidative opacities diffusely throughout both lungs. No pleural effusion. No pneumothorax. SOFT TISSUES/BONES: No acute abnormality of the bones or soft tissues. UPPER ABDOMEN: Limited images of the upper abdomen demonstrate bilateral nonobstructing nephrolithiasis. IMPRESSION: 1. Peribronchovascular and peripheral consolidative opacities diffusely throughout both lungs, most consistent with multifocal pneumonia; differential includes but is not limited to: organizing pneumonia, aspiration pneumonitis, and drug/toxin-related pneumonitis. Prominent Electronically signed by: Norman Gatlin MD 03/08/2024 08:04 PM EST RP Workstation: [id number]   CT Renal Stone Study Result Date: 03/07/2024 CLINICAL DATA:  Abdominal/flank pain.  Weakness anorexia. EXAM: CT ABDOMEN AND PELVIS WITHOUT CONTRAST TECHNIQUE: Multidetector CT imaging of the abdomen and pelvis was performed following the standard protocol without IV contrast. RADIATION DOSE REDUCTION: This exam was performed according to the departmental dose-optimization program which includes automated exposure control, adjustment of the mA and/or kV according to patient size and/or use of  iterative reconstruction technique. COMPARISON:  02/18/2024. FINDINGS: Lower chest: Severe patchy bilateral airspace consolidation in the lung bases, incompletely visualized. Heart is mildly enlarged. No pericardial or pleural effusion. Distal esophagus is grossly unremarkable. Hepatobiliary: Liver and gallbladder are unremarkable. No biliary ductal dilatation. Pancreas: Negative. Spleen: Negative. Adrenals/Urinary Tract: Adrenal glands are unremarkable. Bilateral renal stones. Low-attenuation lesions in the kidneys. No specific follow-up necessary. Right ureter is decompressed. Double-J left ureteral stent in place with the proximal loop in the left renal pelvis and distal loop in the bladder. Minimal left ureteral prominence, similar to 02/18/2024. Previously seen mid left ureteral stone is no longer visualized. Tiny dependent stones in the bladder. Locules of non dependent air are presumably iatrogenic in etiology. Stomach/Bowel: Stomach, small bowel, appendix and colon are unremarkable. Vascular/Lymphatic: Atherosclerotic calcification of the aorta. No pathologically enlarged lymph nodes. Reproductive: Brachytherapy seeds in the prostate. Other: No free fluid.  Mesenteries and peritoneum are unremarkable. Musculoskeletal: Degenerative changes in the spine. Osteopenia. Minimal grade 1 anterolisthesis of L4 on L5 and L5 on S1. Old T12 and L1 compression fractures. IMPRESSION: 1. Multifocal consolidation in the lung bases, indicative of pneumonia. Organizing pneumonia is not excluded. Please correlate clinically. Findings are only partially imaged. Consider CT chest without contrast in further evaluation, as clinically indicated. 2. Bilateral renal stones and tiny bladder stones. Double-J left ureteral stent in place with minimal prominence of the left ureter, unchanged. Previously seen mid left ureteral stone is no longer visualized. 3.  Aortic atherosclerosis (ICD10-I70.0). Electronically Signed   By: Newell Eke M.D.   On: 03/07/2024 14:37   DG Chest Port 1 View Result Date: 03/07/2024 CLINICAL DATA:  Worsening weakness over the past several days. Possible sepsis. EXAM: PORTABLE CHEST 1 VIEW COMPARISON:  02/18/2024 FINDINGS: Lungs are hypoinflated with patchy bilateral airspace opacification likely multifocal pneumonia as this is slightly worse. Possible small amount left pleural fluid unchanged.  Cardiomediastinal silhouette and remainder of the exam is unchanged. IMPRESSION: Slight worsening patchy bilateral airspace process likely multifocal pneumonia. Possible small amount left pleural fluid unchanged. Electronically Signed   By: Toribio Agreste M.D.   On: 03/07/2024 13:19   DG C-Arm 1-60 Min-No Report Result Date: 03/01/2024 Fluoroscopy was utilized by the requesting physician.  No radiographic interpretation.   US  EKG SITE RITE Result Date: 02/23/2024 If Site Rite image not attached, placement could not be confirmed due to current cardiac rhythm.  ECHOCARDIOGRAM LIMITED Result Date: 02/19/2024    ECHOCARDIOGRAM LIMITED REPORT   Patient Name:   JERROL HELMERS Randol Date of Exam: 02/19/2024 Medical Rec #:  [id number]         Height:       71.0 in Accession #:    [id number]        Weight:       238.8 lb Date of Birth:  January 14, 1946          BSA:          2.273 m Patient Age:    78 years          BP:           121/52 mmHg Patient Gender: M                 HR:           70 bpm. Exam Location:  Inpatient Procedure: Limited Echo, Limited Color Doppler and Cardiac Doppler (Both            Spectral and Color Flow Doppler were utilized during procedure). Indications:    Sepsis  History:        Patient has prior history of Echocardiogram examinations, most                 recent 02/07/2024. Risk Factors:Hypertension, Diabetes and                 Dyslipidemia.  Sonographer:    Tinnie Barefoot RDCS Referring Phys: [id number] JAMIE H DAGENHART IMPRESSIONS  1. Left ventricular ejection fraction, by estimation, is 60 to  65%. The left ventricle has normal function. The left ventricle has no regional wall motion abnormalities.  2. Right ventricular systolic function is normal. The right ventricular size is normal. There is normal pulmonary artery systolic pressure. The estimated right ventricular systolic pressure is 28.0 mmHg.  3. The mitral valve is normal in structure. Trivial mitral valve regurgitation. No evidence of mitral stenosis.  4. The aortic valve is tricuspid. Aortic valve regurgitation is not visualized. Aortic valve sclerosis is present, with no evidence of aortic valve stenosis.  5. The inferior vena cava is normal in size with greater than 50% respiratory variability, suggesting right atrial pressure of 3 mmHg. FINDINGS  Left Ventricle: Left ventricular ejection fraction, by estimation, is 60 to 65%. The left ventricle has normal function. The left ventricle has no regional wall motion abnormalities. The left ventricular internal cavity size was normal in size. There is  no left ventricular hypertrophy. Right Ventricle: The right ventricular size is normal. No increase in right ventricular wall thickness. Right ventricular systolic function is normal. There is normal pulmonary artery systolic pressure. The tricuspid regurgitant velocity is 2.50 m/s, and  with an assumed right atrial pressure of 3 mmHg, the estimated right ventricular systolic pressure is 28.0 mmHg. Left Atrium: Left atrial size was normal in size. Right Atrium: Right atrial size was normal in size. Pericardium: There is no evidence  of pericardial effusion. Mitral Valve: The mitral valve is normal in structure. Trivial mitral valve regurgitation. No evidence of mitral valve stenosis. Tricuspid Valve: The tricuspid valve is normal in structure. Tricuspid valve regurgitation is trivial. No evidence of tricuspid stenosis. Aortic Valve: The aortic valve is tricuspid. Aortic valve regurgitation is not visualized. Aortic valve sclerosis is present, with no  evidence of aortic valve stenosis. Pulmonic Valve: The pulmonic valve was normal in structure. Pulmonic valve regurgitation is trivial. No evidence of pulmonic stenosis. Aorta: The aortic root is normal in size and structure. Venous: The inferior vena cava is normal in size with greater than 50% respiratory variability, suggesting right atrial pressure of 3 mmHg. IAS/Shunts: No atrial level shunt detected by color flow Doppler. Additional Comments: Spectral Doppler performed. Color Doppler performed.  LEFT VENTRICLE PLAX 2D LVIDd:         3.70 cm LVIDs:         2.40 cm LV PW:         1.00 cm LV IVS:        1.10 cm  IVC IVC diam: 1.90 cm LEFT ATRIUM         Index LA diam:    3.60 cm 1.58 cm/m  AORTIC VALVE LVOT Vmax:   97.60 cm/s LVOT Vmean:  65.300 cm/s LVOT VTI:    0.233 m MITRAL VALVE               TRICUSPID VALVE MV Area (PHT): 2.34 cm    TR Peak grad:   25.0 mmHg MV Decel Time: 324 msec    TR Vmax:        250.00 cm/s MV E velocity: 72.40 cm/s MV A velocity: 69.70 cm/s  SHUNTS MV E/A ratio:  1.04        Systemic VTI: 0.23 m Wilbert Bihari MD Electronically signed by Wilbert Bihari MD Signature Date/Time: 02/19/2024/11:15:39 AM    Final    DG Abd 1 View Result Date: 02/18/2024 EXAM: 1 VIEW XRAY OF THE ABDOMEN 02/18/2024 10:58:00 PM COMPARISON: 02/18/2024 CLINICAL HISTORY: Abdominal distention FINDINGS: BOWEL: Nonobstructive bowel gas pattern. SOFT TISSUES: Left ureteral stent in place. Prostate brachytherapy seeds noted. 7 mm left mid ureteral calculus adjacent to the stent. 9 mm calculus projects over the right kidney. BONES: No acute osseous abnormality. IMPRESSION: 1. 7 mm left mid ureteral calculus adjacent to the stent. 2. 9 mm calculus projects over the right kidney. Electronically signed by: Norman Gatlin MD 02/18/2024 11:05 PM EST RP Workstation: [id number]   CT ABDOMEN PELVIS WO CONTRAST Result Date: 02/18/2024 CLINICAL DATA:  Left upper quadrant pain. EXAM: CT ABDOMEN AND PELVIS WITHOUT CONTRAST  TECHNIQUE: Multidetector CT imaging of the abdomen and pelvis was performed following the standard protocol without IV contrast. RADIATION DOSE REDUCTION: This exam was performed according to the departmental dose-optimization program which includes automated exposure control, adjustment of the mA and/or kV according to patient size and/or use of iterative reconstruction technique. COMPARISON:  CT abdomen and pelvis 02/05/2024. FINDINGS: Lower chest: No acute abnormality. Hepatobiliary: No focal liver abnormality is seen. No gallstones, gallbladder wall thickening, or biliary dilatation. Pancreas: Unremarkable. No pancreatic ductal dilatation or surrounding inflammatory changes. Spleen: Normal in size without focal abnormality. Adrenals/Urinary Tract: There is a new left ureteral stent in place in proper position. There is no left-sided hydronephrosis. Stable position of calculus in the left mid ureter measuring 5 mm. Bilateral renal calculi are otherwise stable. Bilateral renal cysts have not significantly changed. Adrenal glands are within  normal limits. There is diffuse bladder wall thickening versus normal under distension. Stomach/Bowel: Stomach is within normal limits. No evidence of bowel wall thickening, distention, or inflammatory changes. The appendix is not visualized. Vascular/Lymphatic: Aortic atherosclerosis. No enlarged abdominal or pelvic lymph nodes. There are few nonenlarged left retroperitoneal lymph nodes, unchanged. Reproductive: Prostate radiotherapy seeds are present. Other: There are small fat containing inguinal hernias. There is no ascites. Musculoskeletal: The bones are osteopenic. Chronic compression deformities of T12 and L1 appear unchanged. IMPRESSION: 1. New left ureteral stent in place. No hydronephrosis. 2. Stable position of 5 mm calculus in the left mid ureter. 3. Stable bilateral renal calculi. 4. Diffuse bladder wall thickening versus normal under distension. Correlate  clinically for cystitis. 5. Aortic atherosclerosis. Aortic Atherosclerosis (ICD10-I70.0). Electronically Signed   By: Greig Pique M.D.   On: 02/18/2024 19:54   DG Chest Port 1 View Result Date: 02/18/2024 CLINICAL DATA:  Sepsis EXAM: PORTABLE CHEST 1 VIEW COMPARISON:  Chest x-ray 02/05/2024 FINDINGS: Right upper extremity PICC terminates in the SVC. The heart size and mediastinal contours are within normal limits. Both lungs are clear. The visualized skeletal structures are unremarkable. IMPRESSION: No active disease. Electronically Signed   By: Greig Pique M.D.   On: 02/18/2024 18:42    Alm Schneider, DO  [facility]  If 7PM-7AM, please contact night-coverage [email or web address] Password [email or web address] 03/15/2024, 2:02 PM   LOS: 8 days   "

## 2024-03-16 ENCOUNTER — Inpatient Hospital Stay (HOSPITAL_COMMUNITY)

## 2024-03-16 DIAGNOSIS — R7881 Bacteremia: Secondary | ICD-10-CM | POA: Diagnosis not present

## 2024-03-16 DIAGNOSIS — N1832 Chronic kidney disease, stage 3b: Secondary | ICD-10-CM

## 2024-03-16 DIAGNOSIS — N179 Acute kidney failure, unspecified: Secondary | ICD-10-CM | POA: Diagnosis not present

## 2024-03-16 DIAGNOSIS — J181 Lobar pneumonia, unspecified organism: Secondary | ICD-10-CM

## 2024-03-16 DIAGNOSIS — J690 Pneumonitis due to inhalation of food and vomit: Secondary | ICD-10-CM | POA: Diagnosis not present

## 2024-03-16 LAB — COMPREHENSIVE METABOLIC PANEL WITH GFR
ALT: 15 U/L (ref 0–44)
AST: 31 U/L (ref 15–41)
Albumin: 3.5 g/dL (ref 3.5–5.0)
Alkaline Phosphatase: 112 U/L (ref 38–126)
Anion gap: 15 (ref 5–15)
BUN: 61 mg/dL — ABNORMAL HIGH (ref 8–23)
CO2: 26 mmol/L (ref 22–32)
Calcium: 11.1 mg/dL — ABNORMAL HIGH (ref 8.9–10.3)
Chloride: 99 mmol/L (ref 98–111)
Creatinine, Ser: 4.08 mg/dL — ABNORMAL HIGH (ref 0.61–1.24)
GFR, Estimated: 14 mL/min — ABNORMAL LOW
Glucose, Bld: 122 mg/dL — ABNORMAL HIGH (ref 70–99)
Potassium: 4.1 mmol/L (ref 3.5–5.1)
Sodium: 139 mmol/L (ref 135–145)
Total Bilirubin: 1 mg/dL (ref 0.0–1.2)
Total Protein: 8 g/dL (ref 6.5–8.1)

## 2024-03-16 LAB — CBC
HCT: 30.8 % — ABNORMAL LOW (ref 39.0–52.0)
Hemoglobin: 9.6 g/dL — ABNORMAL LOW (ref 13.0–17.0)
MCH: 29 pg (ref 26.0–34.0)
MCHC: 31.2 g/dL (ref 30.0–36.0)
MCV: 93.1 fL (ref 80.0–100.0)
Platelets: 402 K/uL — ABNORMAL HIGH (ref 150–400)
RBC: 3.31 MIL/uL — ABNORMAL LOW (ref 4.22–5.81)
RDW: 15.9 % — ABNORMAL HIGH (ref 11.5–15.5)
WBC: 12.9 K/uL — ABNORMAL HIGH (ref 4.0–10.5)
nRBC: 0 % (ref 0.0–0.2)

## 2024-03-16 LAB — AMMONIA: Ammonia: 46 umol/L — ABNORMAL HIGH (ref 9–35)

## 2024-03-16 LAB — GLUCOSE, CAPILLARY
Glucose-Capillary: 122 mg/dL — ABNORMAL HIGH (ref 70–99)
Glucose-Capillary: 117 mg/dL — ABNORMAL HIGH (ref 70–99)
Glucose-Capillary: 105 mg/dL — ABNORMAL HIGH (ref 70–99)
Glucose-Capillary: 144 mg/dL — ABNORMAL HIGH (ref 70–99)
Glucose-Capillary: 121 mg/dL — ABNORMAL HIGH (ref 70–99)

## 2024-03-16 LAB — PHOSPHORUS: Phosphorus: 6.2 mg/dL — ABNORMAL HIGH (ref 2.5–4.6)

## 2024-03-16 LAB — MAGNESIUM: Magnesium: 2.3 mg/dL (ref 1.7–2.4)

## 2024-03-16 LAB — TSH: TSH: 2.41 u[IU]/mL (ref 0.350–4.500)

## 2024-03-16 LAB — PROCALCITONIN: Procalcitonin: 0.39 ng/mL

## 2024-03-16 NOTE — Progress Notes (Signed)
 Mobility Specialist Progress Note:    03/16/24 1020  Mobility  Activity Pivoted/transferred from bed to chair  Level of Assistance Minimal assist, patient does 75% or more  Assistive Device Front wheel walker  Distance Ambulated (ft) 4 ft  Range of Motion/Exercises Active;All extremities  Activity Response Tolerated well  Mobility Referral Yes  Mobility visit 1 Mobility  Mobility Specialist Start Time (ACUTE ONLY) 1020  Mobility Specialist Stop Time (ACUTE ONLY) 1040  Mobility Specialist Time Calculation (min) (ACUTE ONLY) 20 min   Pt received In bed, agreeable to mobility. Required MinA to stand and transfer with RW. Tolerated well, mobility has significantly improved from earlier this week. Family in room, all needs met.  Samael Blades Mobility Specialist Please contact via Special Educational Needs Teacher or  Rehab office at [phone]

## 2024-03-16 NOTE — Plan of Care (Signed)

## 2024-03-16 NOTE — Plan of Care (Signed)

## 2024-03-16 NOTE — Progress Notes (Addendum)
 "          PROGRESS NOTE  Marcus Simpson FMW:[id number] DOB: January 12, 1946 DOA: 03/07/2024 PCP: [name], MD  Brief History89  78 year old male with complex past medical history including type 2 diabetes mellitus, hypertension, anemia of chronic disease, stage IIIb CKD, history of prostate cancer, gout, history of left acute pyelonephritis, left ureteral stone status post left ureteral stent placement by Dr. Sherrilee, MRSA bacteremia, Pseudomonas bacteremia, recently discharged with a PICC line and IV daptomycin  for presumed IE since patient refused TEE.  TTE was negative for vegetation.  Unfortunately patient reports that for the past several days he has been feeling unwell with progressive weakness.  He also reports having fever at home.  He pulled his right PICC line out and refused further IV antibiotics.  Home health nursing called infectious disease doctor who recommended that he take Zyvox  600 mg twice daily through end date of 03/20/2024 which was his original end date.  Patient and wife strongly felt that the treatments he was receiving was causing his progressive weakness and debility.  He has had poor oral intake over the past several days, poor appetite and reports that he is supposed to have his left ureteral stent removed by Dr. Sherrilee on Monday, 03/12/2024.  On arrival he was noted to be clinically dehydrated and ill-appearing.  His creatinine had risen to 6.87, up from 1.87 on 02/22/2024.  His hemoglobin is stable at 10.9.  His lactic acid was 2.0.  His CO2 was 16.  His AST was 59, ALT 13, GFR estimated at 8 mL/min.  His urinalysis was positive for hemoglobin, moderate leukocytes protein, rare bacteria 21-50 WBC per high-power field.  CT stone study suggesting consolidation in the lung bases, bilateral renal stones and bladder stones, left ureteral stone in place than previously seen left ureteral stone no longer present.  Patient was given IV fluids and admission requested for AKI  management.   Assessment/Plan: AKI on CKD stage 3b - baseline creatinine 1.8-2.2 - serum creatinine peaked 6.24 -- from poor oral intake from to painful yeast stomatitis  -- improved with IV fluid hydration -- per nephrology ok to DC IVF 12/22 -- DC foley cath 12/22 and follow urine output and monitor bladder scans - 12/24--lasix  IV 40 mg x 1 -- follow daily renal function labs--creatinine improved to 3.85>>3.80>>4.08   Generalized Weakness/Acute metabolic Encephalopathy -- PT eval requested >>SNF  -- B12-->4000 - folate 5.5>> replete - TSH--2.660 - d/c fentanyl , oxycodone , trazodone , benadryl  - use acetaminophen  only for pain - improved with d/c hypnotic agents   Bilateral nephrolithiasis -- 12/17 CT abd/pelvis--Bilateral renal stones and tiny bladder stones. Double-J leftureteral stent in place with minimal prominence of the left ureter, unchanged. Previously seen mid left ureteral stone is no longervisualized. -- pt was due to have ureteral stent removed by Dr. Sherrilee on 03/12/24 -- notified Dr. Sherrilee and he said that his office will schedule to have stent removed in the office    Aspiration pneumonia - IMPROVING -- ordered CT chest without contrast for further investigation and work up -- 12/18 CT chest confirms multifocal pneumonia  -- completing course of augmentin  on 03/13/24 - 12/25--repeat CT chest--improvement in multifocal PNA   Hypercalcemia -- mild elevation, asymptomatic -- PTH = 7  Leukemoid reaction -12/25 CT chest--improving PNA - repeat blood culture -UA if possible - repeat blood culture   Oral Candidiasis--TREATED and IMPROVED  -- fluconazole  ordered and 2 separate doses given -- treated with nystatin  swish  --  he reports much improved symptoms and eating / drinking better    Essential hypertension  -- stable, follow -- resume home meds     MRSE bacteremia -- pt had a PICC line and was on IV daptomycin  until 12/17 when he removed PICC line  and refused further IV antibiotics -- the home heath RN had contacted ID physician and the recommendation was to start zyvox  600 mg BID to complete course of treatment thru 03/20/24 - 12/17 blood cultures neg   Anemia of chronic disease -- Hg stable from recent testing around 10 -- Hg may trend down from hemodilution s/p IV fluids   Metabolic Acidosis--resolved  -- secondary to worsening renal failure  -- sodium bicarbonate  IV infusion completed    Bacteremia due to pseudomonas-completed treatment  -- pt reports he completed the course of antibiotics (cipro ) for this    Type 2 DM, controlled with renal complications -- SSI coverage with frequent CBG monitoring ordered - 12/23/23 A1C--6.4   DVT prophylaxis: enoxaparin  Code Status: Full  Family Communication: son and daughters 12/25 Disposition: anticipate SNF rehab    Consultants:  Nephrology    Procedures:    Antimicrobials:  Zyvox  >> (should end 03/20/24) Augmentin  12/19>>12/23     Subjective: Patient denies fevers, chills, headache, chest pain, dyspnea, nausea, vomiting, diarrhea, abdominal pain, dysuria, hematuria, hematochezia, and melena.   Objective: Vitals:   03/15/24 1239 03/15/24 1945 03/16/24 0333 03/16/24 0500  BP: 129/78 118/69 131/61   Pulse: 80 94 95   Resp: 18 19 19    Temp: 98.5 F (36.9 C) 98.9 F (37.2 C) (!) 97.4 F (36.3 C)   TempSrc: Oral Axillary Oral   SpO2: 94% 96% 95%   Weight:    94.1 kg  Height:        Intake/Output Summary (Last 24 hours) at 03/16/2024 9178 Last data filed at 03/16/2024 9357 Gross per 24 hour  Intake 480 ml  Output 400 ml  Net 80 ml   Weight change: 7 kg Exam:  General:  Pt is alert, follows commands appropriately, not in acute distress HEENT: No icterus, No thrush, No neck mass, [place]/AT Cardiovascular: RRR, S1/S2, no rubs, no gallops Respiratory: left>R basilar rales.  No wheeze Abdomen: Soft/+BS, non tender, non distended, no guarding Extremities: trace  LE edema, No lymphangitis, No petechiae, No rashes, no synovitis   Data Reviewed: I have personally reviewed following labs and imaging studies Basic Metabolic Panel: Recent Labs  Lab 03/12/24 0449 03/13/24 0444 03/14/24 0431 03/14/24 0432 03/15/24 0438 03/16/24 0506  NA 136 136  --  136 138 139  K 4.1 4.0  --  3.9 4.1 4.1  CL 99 98  --  99 99 99  CO2 26 25  --  23 25 26   GLUCOSE 138* 123*  --  136* 131* 122*  BUN 57* 57*  --  58* 56* 61*  CREATININE 4.22* 4.02* 3.69* 3.85* 3.80* 4.08*  CALCIUM  10.8* 11.0*  --  11.0* 10.8* 11.1*  MG 1.9  --   --   --   --  2.3  PHOS 4.0 4.3  --  4.6 4.8* 6.2*   Liver Function Tests: Recent Labs  Lab 03/13/24 0444 03/14/24 0432 03/15/24 0438 03/16/24 0506  AST  --   --   --  31  ALT  --   --   --  15  ALKPHOS  --   --   --  112  BILITOT  --   --   --  1.0  PROT  --   --   --  8.0  ALBUMIN 3.0* 3.1* 3.2* 3.5   No results for input(s): LIPASE, AMYLASE in the last 168 hours. Recent Labs  Lab 03/16/24 0506  AMMONIA 46*   Coagulation Profile: No results for input(s): INR, PROTIME in the last 168 hours. CBC: Recent Labs  Lab 03/11/24 0424 03/12/24 0449 03/14/24 0431 03/15/24 0438 03/16/24 0703  WBC 7.6 8.4 11.0* 11.4* 12.9*  HGB 9.2* 9.4* 9.4* 9.4* 9.6*  HCT 29.4* 30.1* 29.3* 29.7* 30.8*  MCV 91.6 91.5 89.9 90.0 93.1  PLT 439* 500* 493* 453* 402*   Cardiac Enzymes: No results for input(s): CKTOTAL, CKMB, CKMBINDEX, TROPONINI in the last 168 hours. BNP: Invalid input(s): POCBNP CBG: Recent Labs  Lab 03/15/24 1118 03/15/24 1627 03/15/24 2136 03/16/24 0332 03/16/24 0709  GLUCAP 111* 141* 124* 122* 117*   HbA1C: No results for input(s): HGBA1C in the last 72 hours. Urine analysis:    Component Value Date/Time   COLORURINE YELLOW 03/08/2024 0933   APPEARANCEUR CLEAR 03/08/2024 0933   APPEARANCEUR Clear 05/17/2023 1312   LABSPEC 1.011 03/08/2024 0933   PHURINE 6.0 03/08/2024 0933   GLUCOSEU  NEGATIVE 03/08/2024 0933   HGBUR LARGE (A) 03/08/2024 0933   BILIRUBINUR NEGATIVE 03/08/2024 0933   BILIRUBINUR Negative 05/17/2023 1312   KETONESUR NEGATIVE 03/08/2024 0933   PROTEINUR 30 (A) 03/08/2024 0933   NITRITE NEGATIVE 03/08/2024 0933   LEUKOCYTESUR MODERATE (A) 03/08/2024 0933   Sepsis Labs: @LABRCNTIP (procalcitonin:4,lacticidven:4) ) Recent Results (from the past 240 hours)  Blood culture (routine x 2)     Status: None   Collection Time: 03/07/24 12:16 PM   Specimen: BLOOD  Result Value Ref Range Status   Specimen Description BLOOD LEFT ANTECUBITAL  Final   Special Requests   Final    BOTTLES DRAWN AEROBIC AND ANAEROBIC Blood Culture adequate volume   Culture   Final    NO GROWTH 5 DAYS Performed at [facility], [address], [place], [place] [address]    Report Status 03/12/2024 FINAL  Final  Blood culture (routine x 2)     Status: None   Collection Time: 03/07/24 12:30 PM   Specimen: BLOOD RIGHT ARM  Result Value Ref Range Status   Specimen Description BLOOD RIGHT ARM  Final   Special Requests   Final    BOTTLES DRAWN AEROBIC ONLY Blood Culture adequate volume   Culture   Final    NO GROWTH 5 DAYS Performed at [facility], [address] [place], [place] [address]    Report Status 03/12/2024 FINAL  Final  Urine Culture     Status: None   Collection Time: 03/07/24 12:46 PM   Specimen: Urine, Random  Result Value Ref Range Status   Specimen Description   Final    URINE, RANDOM Performed at [facility], [address], [place], [place] [address]    Special Requests   Final    NONE Reflexed from [id number] Performed at [facility], [address], [place], [place] [address]    Culture   Final    NO GROWTH Performed at [facility], [address], [place], [place] [address]    Report Status 03/08/2024 FINAL  Final     Scheduled Meds:  acetaminophen   1,000 mg Oral Q8H   Chlorhexidine  Gluconate Cloth  6 each Topical Daily   enoxaparin  (LOVENOX )  injection  30 mg Subcutaneous Q24H   folic acid   1 mg Oral Daily  insulin  aspart  0-9 Units Subcutaneous TID WC   linezolid   600 mg Oral Q12H   nystatin   5 mL Oral QID   tamsulosin   0.4 mg Oral QPC breakfast   Continuous Infusions:  Procedures/Studies: CT CHEST WO CONTRAST Result Date: 03/15/2024 EXAM: CT CHEST WITHOUT CONTRAST 03/15/2024 05:55:00 PM TECHNIQUE: CT of the chest was performed without the administration of intravenous contrast. Multiplanar reformatted images are provided for review. Automated exposure control, iterative reconstruction, and/or weight based adjustment of the mA/kV was utilized to reduce the radiation dose to as low as reasonably achievable. COMPARISON: CT 03/08/2024. CLINICAL HISTORY: Dyspnea, chronic, unclear etiology; Respiratory illness, nondiagnostic xray; pneumonia, dyspnea, increasing wbc. FINDINGS: MEDIASTINUM: Heart and pericardium are unremarkable. The central airways are clear. Coronary artery and aortic atherosclerotic calcification. Dilated ascending aorta measuring 41 mm. LYMPH NODES: No mediastinal, hilar or axillary lymphadenopathy. LUNGS AND PLEURA: Slight improvement in peribronchovascular and peripheral consolidative opacities diffusely throughout both lungs when compared with 03/08/2024. No pleural effusion or pneumothorax. SOFT TISSUES/BONES: No acute abnormality of the bones or soft tissues. UPPER ABDOMEN: Limited images of the upper abdomen demonstrates no acute abnormality. IMPRESSION: 1. Slight improvement in multifocal pneumonia. 2. Dilated ascending aorta measuring 4.1 cm.Recommend annual imaging followup by CTA or MRA. This recommendation follows 2010 ACCF/AHA/AATS/ACR/ASA/SCA/SCAI/SIR/STS/SVM Guidelines for the Diagnosis and Management of Patients with Thoracic Aortic Disease. Circulation. 2010; 121: Z733-z630. Aortic aneurysm NOS (ICD10-I71.9) Electronically signed by: Norman Gatlin MD 03/15/2024 07:45 PM EST RP Workstation: [id number]   CT  CHEST WO CONTRAST Result Date: 03/08/2024 EXAM: CT CHEST WITHOUT CONTRAST 03/08/2024 03:22:11 PM TECHNIQUE: CT of the chest was performed without the administration of intravenous contrast. Multiplanar reformatted images are provided for review. Automated exposure control, iterative reconstruction, and/or weight based adjustment of the mA/kV was utilized to reduce the radiation dose to as low as reasonably achievable. COMPARISON: CT and x-ray from 03/07/2024 and CT chest 10/30/2023. CLINICAL HISTORY: question of pneumonia (multifocal) FINDINGS: MEDIASTINUM: Heart and pericardium are unremarkable. The central airways are clear. LYMPH NODES: No mediastinal, hilar or axillary lymphadenopathy. LUNGS AND PLEURA: Peribronchovascular and peripheral consolidative opacities diffusely throughout both lungs. No pleural effusion. No pneumothorax. SOFT TISSUES/BONES: No acute abnormality of the bones or soft tissues. UPPER ABDOMEN: Limited images of the upper abdomen demonstrate bilateral nonobstructing nephrolithiasis. IMPRESSION: 1. Peribronchovascular and peripheral consolidative opacities diffusely throughout both lungs, most consistent with multifocal pneumonia; differential includes but is not limited to: organizing pneumonia, aspiration pneumonitis, and drug/toxin-related pneumonitis. Prominent Electronically signed by: Norman Gatlin MD 03/08/2024 08:04 PM EST RP Workstation: [id number]   CT Renal Stone Study Result Date: 03/07/2024 CLINICAL DATA:  Abdominal/flank pain.  Weakness anorexia. EXAM: CT ABDOMEN AND PELVIS WITHOUT CONTRAST TECHNIQUE: Multidetector CT imaging of the abdomen and pelvis was performed following the standard protocol without IV contrast. RADIATION DOSE REDUCTION: This exam was performed according to the departmental dose-optimization program which includes automated exposure control, adjustment of the mA and/or kV according to patient size and/or use of iterative reconstruction technique.  COMPARISON:  02/18/2024. FINDINGS: Lower chest: Severe patchy bilateral airspace consolidation in the lung bases, incompletely visualized. Heart is mildly enlarged. No pericardial or pleural effusion. Distal esophagus is grossly unremarkable. Hepatobiliary: Liver and gallbladder are unremarkable. No biliary ductal dilatation. Pancreas: Negative. Spleen: Negative. Adrenals/Urinary Tract: Adrenal glands are unremarkable. Bilateral renal stones. Low-attenuation lesions in the kidneys. No specific follow-up necessary. Right ureter is decompressed. Double-J left ureteral stent in place with the proximal loop in the left renal pelvis and distal loop in  the bladder. Minimal left ureteral prominence, similar to 02/18/2024. Previously seen mid left ureteral stone is no longer visualized. Tiny dependent stones in the bladder. Locules of non dependent air are presumably iatrogenic in etiology. Stomach/Bowel: Stomach, small bowel, appendix and colon are unremarkable. Vascular/Lymphatic: Atherosclerotic calcification of the aorta. No pathologically enlarged lymph nodes. Reproductive: Brachytherapy seeds in the prostate. Other: No free fluid.  Mesenteries and peritoneum are unremarkable. Musculoskeletal: Degenerative changes in the spine. Osteopenia. Minimal grade 1 anterolisthesis of L4 on L5 and L5 on S1. Old T12 and L1 compression fractures. IMPRESSION: 1. Multifocal consolidation in the lung bases, indicative of pneumonia. Organizing pneumonia is not excluded. Please correlate clinically. Findings are only partially imaged. Consider CT chest without contrast in further evaluation, as clinically indicated. 2. Bilateral renal stones and tiny bladder stones. Double-J left ureteral stent in place with minimal prominence of the left ureter, unchanged. Previously seen mid left ureteral stone is no longer visualized. 3.  Aortic atherosclerosis (ICD10-I70.0). Electronically Signed   By: Newell Eke M.D.   On: 03/07/2024 14:37    DG Chest Port 1 View Result Date: 03/07/2024 CLINICAL DATA:  Worsening weakness over the past several days. Possible sepsis. EXAM: PORTABLE CHEST 1 VIEW COMPARISON:  02/18/2024 FINDINGS: Lungs are hypoinflated with patchy bilateral airspace opacification likely multifocal pneumonia as this is slightly worse. Possible small amount left pleural fluid unchanged. Cardiomediastinal silhouette and remainder of the exam is unchanged. IMPRESSION: Slight worsening patchy bilateral airspace process likely multifocal pneumonia. Possible small amount left pleural fluid unchanged. Electronically Signed   By: Toribio Agreste M.D.   On: 03/07/2024 13:19   DG C-Arm 1-60 Min-No Report Result Date: 03/01/2024 Fluoroscopy was utilized by the requesting physician.  No radiographic interpretation.   US  EKG SITE RITE Result Date: 02/23/2024 If Site Rite image not attached, placement could not be confirmed due to current cardiac rhythm.  ECHOCARDIOGRAM LIMITED Result Date: 02/19/2024    ECHOCARDIOGRAM LIMITED REPORT   Patient Name:   AMARE BAIL Netherland Date of Exam: 02/19/2024 Medical Rec #:  [id number]         Height:       71.0 in Accession #:    [id number]        Weight:       238.8 lb Date of Birth:  11-Jul-1945          BSA:          2.273 m Patient Age:    78 years          BP:           121/52 mmHg Patient Gender: M                 HR:           70 bpm. Exam Location:  Inpatient Procedure: Limited Echo, Limited Color Doppler and Cardiac Doppler (Both            Spectral and Color Flow Doppler were utilized during procedure). Indications:    Sepsis  History:        Patient has prior history of Echocardiogram examinations, most                 recent 02/07/2024. Risk Factors:Hypertension, Diabetes and                 Dyslipidemia.  Sonographer:    Tinnie Barefoot RDCS Referring Phys: [id number] JAMIE H DAGENHART IMPRESSIONS  1. Left ventricular ejection fraction, by estimation, is 60 to  65%. The left ventricle has normal  function. The left ventricle has no regional wall motion abnormalities.  2. Right ventricular systolic function is normal. The right ventricular size is normal. There is normal pulmonary artery systolic pressure. The estimated right ventricular systolic pressure is 28.0 mmHg.  3. The mitral valve is normal in structure. Trivial mitral valve regurgitation. No evidence of mitral stenosis.  4. The aortic valve is tricuspid. Aortic valve regurgitation is not visualized. Aortic valve sclerosis is present, with no evidence of aortic valve stenosis.  5. The inferior vena cava is normal in size with greater than 50% respiratory variability, suggesting right atrial pressure of 3 mmHg. FINDINGS  Left Ventricle: Left ventricular ejection fraction, by estimation, is 60 to 65%. The left ventricle has normal function. The left ventricle has no regional wall motion abnormalities. The left ventricular internal cavity size was normal in size. There is  no left ventricular hypertrophy. Right Ventricle: The right ventricular size is normal. No increase in right ventricular wall thickness. Right ventricular systolic function is normal. There is normal pulmonary artery systolic pressure. The tricuspid regurgitant velocity is 2.50 m/s, and  with an assumed right atrial pressure of 3 mmHg, the estimated right ventricular systolic pressure is 28.0 mmHg. Left Atrium: Left atrial size was normal in size. Right Atrium: Right atrial size was normal in size. Pericardium: There is no evidence of pericardial effusion. Mitral Valve: The mitral valve is normal in structure. Trivial mitral valve regurgitation. No evidence of mitral valve stenosis. Tricuspid Valve: The tricuspid valve is normal in structure. Tricuspid valve regurgitation is trivial. No evidence of tricuspid stenosis. Aortic Valve: The aortic valve is tricuspid. Aortic valve regurgitation is not visualized. Aortic valve sclerosis is present, with no evidence of aortic valve stenosis.  Pulmonic Valve: The pulmonic valve was normal in structure. Pulmonic valve regurgitation is trivial. No evidence of pulmonic stenosis. Aorta: The aortic root is normal in size and structure. Venous: The inferior vena cava is normal in size with greater than 50% respiratory variability, suggesting right atrial pressure of 3 mmHg. IAS/Shunts: No atrial level shunt detected by color flow Doppler. Additional Comments: Spectral Doppler performed. Color Doppler performed.  LEFT VENTRICLE PLAX 2D LVIDd:         3.70 cm LVIDs:         2.40 cm LV PW:         1.00 cm LV IVS:        1.10 cm  IVC IVC diam: 1.90 cm LEFT ATRIUM         Index LA diam:    3.60 cm 1.58 cm/m  AORTIC VALVE LVOT Vmax:   97.60 cm/s LVOT Vmean:  65.300 cm/s LVOT VTI:    0.233 m MITRAL VALVE               TRICUSPID VALVE MV Area (PHT): 2.34 cm    TR Peak grad:   25.0 mmHg MV Decel Time: 324 msec    TR Vmax:        250.00 cm/s MV E velocity: 72.40 cm/s MV A velocity: 69.70 cm/s  SHUNTS MV E/A ratio:  1.04        Systemic VTI: 0.23 m Wilbert Bihari MD Electronically signed by Wilbert Bihari MD Signature Date/Time: 02/19/2024/11:15:39 AM    Final    DG Abd 1 View Result Date: 02/18/2024 EXAM: 1 VIEW XRAY OF THE ABDOMEN 02/18/2024 10:58:00 PM COMPARISON: 02/18/2024 CLINICAL HISTORY: Abdominal distention FINDINGS: BOWEL: Nonobstructive bowel gas pattern. SOFT  TISSUES: Left ureteral stent in place. Prostate brachytherapy seeds noted. 7 mm left mid ureteral calculus adjacent to the stent. 9 mm calculus projects over the right kidney. BONES: No acute osseous abnormality. IMPRESSION: 1. 7 mm left mid ureteral calculus adjacent to the stent. 2. 9 mm calculus projects over the right kidney. Electronically signed by: Norman Gatlin MD 02/18/2024 11:05 PM EST RP Workstation: [id number]   CT ABDOMEN PELVIS WO CONTRAST Result Date: 02/18/2024 CLINICAL DATA:  Left upper quadrant pain. EXAM: CT ABDOMEN AND PELVIS WITHOUT CONTRAST TECHNIQUE: Multidetector CT imaging  of the abdomen and pelvis was performed following the standard protocol without IV contrast. RADIATION DOSE REDUCTION: This exam was performed according to the departmental dose-optimization program which includes automated exposure control, adjustment of the mA and/or kV according to patient size and/or use of iterative reconstruction technique. COMPARISON:  CT abdomen and pelvis 02/05/2024. FINDINGS: Lower chest: No acute abnormality. Hepatobiliary: No focal liver abnormality is seen. No gallstones, gallbladder wall thickening, or biliary dilatation. Pancreas: Unremarkable. No pancreatic ductal dilatation or surrounding inflammatory changes. Spleen: Normal in size without focal abnormality. Adrenals/Urinary Tract: There is a new left ureteral stent in place in proper position. There is no left-sided hydronephrosis. Stable position of calculus in the left mid ureter measuring 5 mm. Bilateral renal calculi are otherwise stable. Bilateral renal cysts have not significantly changed. Adrenal glands are within normal limits. There is diffuse bladder wall thickening versus normal under distension. Stomach/Bowel: Stomach is within normal limits. No evidence of bowel wall thickening, distention, or inflammatory changes. The appendix is not visualized. Vascular/Lymphatic: Aortic atherosclerosis. No enlarged abdominal or pelvic lymph nodes. There are few nonenlarged left retroperitoneal lymph nodes, unchanged. Reproductive: Prostate radiotherapy seeds are present. Other: There are small fat containing inguinal hernias. There is no ascites. Musculoskeletal: The bones are osteopenic. Chronic compression deformities of T12 and L1 appear unchanged. IMPRESSION: 1. New left ureteral stent in place. No hydronephrosis. 2. Stable position of 5 mm calculus in the left mid ureter. 3. Stable bilateral renal calculi. 4. Diffuse bladder wall thickening versus normal under distension. Correlate clinically for cystitis. 5. Aortic  atherosclerosis. Aortic Atherosclerosis (ICD10-I70.0). Electronically Signed   By: Greig Pique M.D.   On: 02/18/2024 19:54   DG Chest Port 1 View Result Date: 02/18/2024 CLINICAL DATA:  Sepsis EXAM: PORTABLE CHEST 1 VIEW COMPARISON:  Chest x-ray 02/05/2024 FINDINGS: Right upper extremity PICC terminates in the SVC. The heart size and mediastinal contours are within normal limits. Both lungs are clear. The visualized skeletal structures are unremarkable. IMPRESSION: No active disease. Electronically Signed   By: Greig Pique M.D.   On: 02/18/2024 18:42    Alm Schneider, DO  [facility]  If 7PM-7AM, please contact night-coverage [email or web address] Password [email or web address] 03/16/2024, 8:21 AM   LOS: 9 days   "

## 2024-03-16 NOTE — Progress Notes (Signed)
 Patient ID: Marcus Simpson, male   DOB: Nov 24, 1945, 78 y.o.   MRN: [id number]  Assessment/Plan:  AKI/CKD stage IIIb -hard to define definitive baseline.  Creatinine is fluctuated widely since October 2024.  Intermittent obstruction and dehydration are contributors.  I think he is likely had some degree of progression.  It is also unclear how much kidney function he will regain.  I discussed that his kidney function is very poor and his outcomes are unclear.  He does not have a nephrologist in the outpatient setting.  His kidney function is fairly stable today I think he can discharge but needs to follow-up with us  ideally on 1/5.  My office is working on getting this set up.  My office will also arrange labs next week  AGMA -resolved Multifocal pneumonia - per primary svc and ID.  Seems improved Generalized weakness -multifactorial.  Continue home health Bilateral nephrolithiasis -required stenting.  Management per urology Anemia of chronic disease: Likely multifactorial.  Continue to monitor MRSE bacteremia - PICC line out and had been on IV daptomycin .  Per ID, zyvox  600 mg bid through 03/20/24 Dm type 2 - per primary  S: Patient states he feels well.  Denies any nausea, vomiting, diarrhea.  He was like edema is much improved . O:BP 131/61 (BP Location: Right Arm)   Pulse 95   Temp (!) 97.4 F (36.3 C) (Oral)   Resp 19   Ht 5' 11 (1.803 m)   Wt 94.1 kg   SpO2 95%   BMI 28.93 kg/m   Intake/Output Summary (Last 24 hours) at 03/16/2024 1157 Last data filed at 03/16/2024 0900 Gross per 24 hour  Intake 240 ml  Output 700 ml  Net -460 ml   Intake/Output: I/O last 3 completed shifts: In: 820 [P.O.:820] Out: 1550 [Urine:1550]  Intake/Output this shift:  Total I/O In: -  Out: 300 [Urine:300] Weight change: 7 kg Gen: NAD, sitting in chair CVS: Normal rate, no rub Resp: Bilateral chest rise with no increased work of breathing Abd: +BS, soft, NT/ND Ext: 1+ pretibial edema on the  bilateral lower extremities warm and well-perfused  Recent Labs  Lab 03/10/24 0541 03/11/24 0424 03/12/24 0449 03/13/24 0444 03/14/24 0431 03/14/24 0432 03/15/24 0438 03/16/24 0506  NA 138 137 136 136  --  136 138 139  K 4.2 3.9 4.1 4.0  --  3.9 4.1 4.1  CL 102 101 99 98  --  99 99 99  CO2 26 24 26 25   --  23 25 26   GLUCOSE 121* 126* 138* 123*  --  136* 131* 122*  BUN 71* 59* 57* 57*  --  58* 56* 61*  CREATININE 5.13* 4.55* 4.22* 4.02* 3.69* 3.85* 3.80* 4.08*  ALBUMIN  --   --   --  3.0*  --  3.1* 3.2* 3.5  CALCIUM  9.8 10.0 10.8* 11.0*  --  11.0* 10.8* 11.1*  PHOS 4.4  --  4.0 4.3  --  4.6 4.8* 6.2*  AST  --   --   --   --   --   --   --  31  ALT  --   --   --   --   --   --   --  15   Liver Function Tests: Recent Labs  Lab 03/14/24 0432 03/15/24 0438 03/16/24 0506  AST  --   --  31  ALT  --   --  15  ALKPHOS  --   --  112  BILITOT  --   --  1.0  PROT  --   --  8.0  ALBUMIN 3.1* 3.2* 3.5   No results for input(s): LIPASE, AMYLASE in the last 168 hours. Recent Labs  Lab 03/16/24 0506  AMMONIA 46*   CBC: Recent Labs  Lab 03/11/24 0424 03/12/24 0449 03/14/24 0431 03/15/24 0438 03/16/24 0703  WBC 7.6 8.4 11.0* 11.4* 12.9*  HGB 9.2* 9.4* 9.4* 9.4* 9.6*  HCT 29.4* 30.1* 29.3* 29.7* 30.8*  MCV 91.6 91.5 89.9 90.0 93.1  PLT 439* 500* 493* 453* 402*   Cardiac Enzymes: No results for input(s): CKTOTAL, CKMB, CKMBINDEX, TROPONINI in the last 168 hours. CBG: Recent Labs  Lab 03/15/24 1627 03/15/24 2136 03/16/24 0332 03/16/24 0709 03/16/24 1122  GLUCAP 141* 124* 122* 117* 105*    Iron Studies: No results for input(s): IRON, TIBC, TRANSFERRIN, FERRITIN in the last 72 hours. Studies/Results: CT RENAL STONE STUDY Result Date: 03/16/2024 EXAM: CT UROGRAM 03/16/2024 09:14:34 AM TECHNIQUE: CT of the abdomen and pelvis was performed without the administration of intravenous contrast as per CT urogram protocol. Multiplanar reformatted images  as well as MIP urogram images are provided for review. Automated exposure control, iterative reconstruction, and/or weight based adjustment of the mA/kV was utilized to reduce the radiation dose to as low as reasonably achievable. COMPARISON: CT chest 03/08/2024, CT abdomen 03/07/2024. CLINICAL HISTORY: Increasing WBC, hx of left ureteral stent. FINDINGS: LOWER CHEST: Again demonstrated density bibasilar pulmonary infiltrates slightly improved from comparison exam. LIVER: The liver is unremarkable. GALLBLADDER AND BILE DUCTS: Gallbladder is unremarkable. No biliary ductal dilatation. SPLEEN: No acute abnormality. PANCREAS: No acute abnormality. ADRENAL GLANDS: No acute abnormality. KIDNEYS, URETERS AND BLADDER: Left double J ureteral stent in proper position. No left hydronephrosis or hydroureter. There are bilateral nonobstructing renal calculi. No ureteral lithiasis. There are several small calculi within the dome of the bladder measuring 2 to 3 mm each. Bilateral simple fluid density renal lesions are unchanged. Per consensus, no follow-up is needed for simple Bosniak type 1 and 2 renal cysts, unless the patient has a malignancy history or risk factors. GI AND BOWEL: Stomach demonstrates no acute abnormality. There is no bowel obstruction. PERITONEUM AND RETROPERITONEUM: No ascites. No free air. VASCULATURE: Aorta is normal in caliber. LYMPH NODES: No lymphadenopathy. REPRODUCTIVE ORGANS: Multiple brachytherapy seeds positioned throughout the prostate. BONES AND SOFT TISSUES: No acute osseous abnormality. No focal soft tissue abnormality. IMPRESSION: 1. Left double J ureteral stent in proper position, with no left hydronephrosis or hydroureter. 2. Bibasilar pulmonary infiltrates, slightly improved from the comparison exam. 3. Bilateral nonobstructing renal calculi, with no ureteral lithiasis. 4. Several small bladder calculi measuring 2 to 3 mm each. Electronically signed by: Norleen Boxer MD 03/16/2024 09:45 AM  EST RP Workstation: [id number]   CT CHEST WO CONTRAST Result Date: 03/15/2024 EXAM: CT CHEST WITHOUT CONTRAST 03/15/2024 05:55:00 PM TECHNIQUE: CT of the chest was performed without the administration of intravenous contrast. Multiplanar reformatted images are provided for review. Automated exposure control, iterative reconstruction, and/or weight based adjustment of the mA/kV was utilized to reduce the radiation dose to as low as reasonably achievable. COMPARISON: CT 03/08/2024. CLINICAL HISTORY: Dyspnea, chronic, unclear etiology; Respiratory illness, nondiagnostic xray; pneumonia, dyspnea, increasing wbc. FINDINGS: MEDIASTINUM: Heart and pericardium are unremarkable. The central airways are clear. Coronary artery and aortic atherosclerotic calcification. Dilated ascending aorta measuring 41 mm. LYMPH NODES: No mediastinal, hilar or axillary lymphadenopathy. LUNGS AND PLEURA: Slight improvement in peribronchovascular and peripheral consolidative opacities diffusely throughout  both lungs when compared with 03/08/2024. No pleural effusion or pneumothorax. SOFT TISSUES/BONES: No acute abnormality of the bones or soft tissues. UPPER ABDOMEN: Limited images of the upper abdomen demonstrates no acute abnormality. IMPRESSION: 1. Slight improvement in multifocal pneumonia. 2. Dilated ascending aorta measuring 4.1 cm.Recommend annual imaging followup by CTA or MRA. This recommendation follows 2010 ACCF/AHA/AATS/ACR/ASA/SCA/SCAI/SIR/STS/SVM Guidelines for the Diagnosis and Management of Patients with Thoracic Aortic Disease. Circulation. 2010; 121: Z733-z630. Aortic aneurysm NOS (ICD10-I71.9) Electronically signed by: Norman Gatlin MD 03/15/2024 07:45 PM EST RP Workstation: [id number]    acetaminophen   1,000 mg Oral Q8H   Chlorhexidine  Gluconate Cloth  6 each Topical Daily   enoxaparin  (LOVENOX ) injection  30 mg Subcutaneous Q24H   folic acid   1 mg Oral Daily   insulin  aspart  0-9 Units Subcutaneous TID WC    linezolid   600 mg Oral Q12H   nystatin   5 mL Oral QID   tamsulosin   0.4 mg Oral QPC breakfast    BMET    Component Value Date/Time   NA 139 03/16/2024 0506   K 4.1 03/16/2024 0506   CL 99 03/16/2024 0506   CO2 26 03/16/2024 0506   GLUCOSE 122 (H) 03/16/2024 0506   BUN 61 (H) 03/16/2024 0506   BUN 20 06/29/2022 1354   CREATININE 4.08 (H) 03/16/2024 0506   CALCIUM  11.1 (H) 03/16/2024 0506   GFRNONAA 14 (L) 03/16/2024 0506   GFRAA >60 05/04/2017 0525   CBC    Component Value Date/Time   WBC 12.9 (H) 03/16/2024 0703   RBC 3.31 (L) 03/16/2024 0703   HGB 9.6 (L) 03/16/2024 0703   HCT 30.8 (L) 03/16/2024 0703   PLT 402 (H) 03/16/2024 0703   MCV 93.1 03/16/2024 0703   MCH 29.0 03/16/2024 0703   MCHC 31.2 03/16/2024 0703   RDW 15.9 (H) 03/16/2024 0703   LYMPHSABS 0.9 03/07/2024 1216   MONOABS 1.0 03/07/2024 1216   EOSABS 0.6 (H) 03/07/2024 1216   BASOSABS 0.0 03/07/2024 1216

## 2024-03-17 DIAGNOSIS — R7881 Bacteremia: Secondary | ICD-10-CM | POA: Diagnosis not present

## 2024-03-17 DIAGNOSIS — E872 Acidosis, unspecified: Secondary | ICD-10-CM | POA: Diagnosis not present

## 2024-03-17 DIAGNOSIS — N179 Acute kidney failure, unspecified: Secondary | ICD-10-CM | POA: Diagnosis not present

## 2024-03-17 DIAGNOSIS — J181 Lobar pneumonia, unspecified organism: Secondary | ICD-10-CM | POA: Diagnosis not present

## 2024-03-17 LAB — RENAL FUNCTION PANEL
Albumin: 3.4 g/dL — ABNORMAL LOW (ref 3.5–5.0)
Anion gap: 13 (ref 5–15)
BUN: 60 mg/dL — ABNORMAL HIGH (ref 8–23)
CO2: 26 mmol/L (ref 22–32)
Calcium: 10.2 mg/dL (ref 8.9–10.3)
Chloride: 95 mmol/L — ABNORMAL LOW (ref 98–111)
Creatinine, Ser: 4.02 mg/dL — ABNORMAL HIGH (ref 0.61–1.24)
GFR, Estimated: 14 mL/min — ABNORMAL LOW
Glucose, Bld: 119 mg/dL — ABNORMAL HIGH (ref 70–99)
Phosphorus: 5.3 mg/dL — ABNORMAL HIGH (ref 2.5–4.6)
Potassium: 4.3 mmol/L (ref 3.5–5.1)
Sodium: 133 mmol/L — ABNORMAL LOW (ref 135–145)

## 2024-03-17 LAB — GLUCOSE, CAPILLARY
Glucose-Capillary: 108 mg/dL — ABNORMAL HIGH (ref 70–99)
Glucose-Capillary: 123 mg/dL — ABNORMAL HIGH (ref 70–99)
Glucose-Capillary: 120 mg/dL — ABNORMAL HIGH (ref 70–99)

## 2024-03-17 MED ORDER — METOPROLOL SUCCINATE ER 25 MG PO TB24: 25.0000 mg | ORAL_TABLET | Freq: Every day | ORAL | Status: DC

## 2024-03-17 MED ORDER — FOLIC ACID 1 MG PO TABS: 1.0000 mg | ORAL_TABLET | Freq: Every day | ORAL | Status: DC

## 2024-03-17 MED ORDER — METOPROLOL SUCCINATE ER 25 MG PO TB24: 25.0000 mg | ORAL_TABLET | Freq: Every day | ORAL | 2 refills | Status: AC

## 2024-03-17 NOTE — TOC Progression Note (Addendum)
 Transition of Care [facility]) - Progression Note    Patient Details  Name: Marcus Simpson MRN: [id number] Date of Birth: 1946/02/26  Transition of Care [facility]) CM/SW Contact  Ronnald MARLA Sil, RN Phone Number: 03/17/2024, 12:51 PM  Clinical Narrative:    CM notified by both the Patient's primary RN Philippe & Dr Tat of the patient & wife's reconsideration of SNF placement for rehab and preference to return home with [facility].  CM met with spouse & patient at bedside to confirm choice, then contacted Agency liaison - Dorothe to notify of D/C today and she confirmed availability to accept pt.  CM sent referral, confirmed HH order entered, and confirmed transportation to be provided by family.  Spouse confirmed availability of all necessary DME at home.  Primary RN - Philippe updated on all aforementioned.  No additional needs identified at this time, however CM will continue to follow along and assist as appropriate   Expected Discharge Plan: Home w Home Health Services Barriers to Discharge: No Barriers Identified   Expected Discharge Plan and Services In-house Referral: Clinical Social Work Discharge Planning Services: CM Consult Post Acute Care Choice: Home Health Living arrangements for the past 2 months: Single Family Home Expected Discharge Date: 03/17/24               DME Arranged: N/A DME Agency: NA HH Arranged: PT HH Agency: [facility] Date HH Agency Contacted: 03/17/24 Time HH Agency Contacted: 1250 Representative spoke with at [facility] Agency: Dorothe   Social Drivers of Health (SDOH) Interventions SDOH Screenings   Food Insecurity: No Food Insecurity (03/07/2024)  Housing: Low Risk (03/07/2024)  Transportation Needs: No Transportation Needs (03/07/2024)  Utilities: Not At Risk (03/07/2024)  Alcohol Screen: Low Risk (10/11/2023)  Depression (PHQ2-9): Low Risk (01/19/2024)  Social Connections: Moderately Integrated (03/07/2024)  Recent Concern: Social Connections -  Moderately Isolated (02/05/2024)  Tobacco Use: Medium Risk (03/07/2024)    Readmission Risk Interventions    03/08/2024    1:38 PM 02/23/2024    3:25 PM  Readmission Risk Prevention Plan  Transportation Screening Complete Complete  PCP or Specialist Appt within 3-5 Days  Complete  HRI or Home Care Consult Complete Complete  Social Work Consult for Recovery Care Planning/Counseling Complete Complete  Palliative Care Screening Not Applicable Not Applicable  Medication Review Ocean[employer])  Complete

## 2024-03-17 NOTE — Plan of Care (Signed)
" °  Problem: Education: Goal: Knowledge of General Education information will improve Description: Including pain rating scale, medication(s)/side effects and non-pharmacologic comfort measures Outcome: Progressing   Problem: Health Behavior/Discharge Planning: Goal: Ability to manage health-related needs will improve Outcome: Progressing   Problem: Clinical Measurements: Goal: Ability to maintain clinical measurements within normal limits will improve Outcome: Progressing Goal: Will remain free from infection Outcome: Progressing Goal: Diagnostic test results will improve Outcome: Progressing Goal: Respiratory complications will improve Outcome: Progressing Goal: Cardiovascular complication will be avoided Outcome: Progressing   Problem: Activity: Goal: Risk for activity intolerance will decrease Outcome: Progressing   Problem: Nutrition: Goal: Adequate nutrition will be maintained Outcome: Progressing   Problem: Coping: Goal: Level of anxiety will decrease Outcome: Progressing   Problem: Safety: Goal: Ability to remain free from injury will improve Outcome: Progressing   Problem: Skin Integrity: Goal: Risk for impaired skin integrity will decrease Outcome: Progressing   Problem: Education: Goal: Ability to describe self-care measures that may prevent or decrease complications (Diabetes Survival Skills Education) will improve Outcome: Progressing   Problem: Coping: Goal: Ability to adjust to condition or change in health will improve Outcome: Progressing   Problem: Fluid Volume: Goal: Ability to maintain a balanced intake and output will improve Outcome: Progressing   Problem: Health Behavior/Discharge Planning: Goal: Ability to identify and utilize available resources and services will improve Outcome: Progressing Goal: Ability to manage health-related needs will improve Outcome: Progressing   Problem: Metabolic: Goal: Ability to maintain appropriate  glucose levels will improve Outcome: Progressing   Problem: Nutritional: Goal: Maintenance of adequate nutrition will improve Outcome: Progressing Goal: Progress toward achieving an optimal weight will improve Outcome: Progressing   Problem: Skin Integrity: Goal: Risk for impaired skin integrity will decrease Outcome: Progressing   Problem: Tissue Perfusion: Goal: Adequacy of tissue perfusion will improve Outcome: Progressing   "

## 2024-03-17 NOTE — Discharge Summary (Addendum)
 " Physician Discharge Summary   Patient: Marcus Simpson MRN: [id number] DOB: [birth year]  Admit date:     03/07/2024  Discharge date: 03/17/2024  Discharge Physician: Alm Sanav Remer   PCP: [name], MD   Recommendations at discharge:   Please follow up with primary care provider within 1-2 weeks  Please repeat BMP and CBC in one week     Hospital Course: 78 year old male with complex past medical history including type 2 diabetes mellitus, hypertension, anemia of chronic disease, stage IIIb CKD, history of prostate cancer, gout, history of left acute pyelonephritis, left ureteral stone status post left ureteral stent placement by Dr. Sherrilee, MRSA bacteremia, Pseudomonas bacteremia, recently discharged with a PICC line and IV daptomycin  for presumed IE since patient refused TEE.  TTE was negative for vegetation.  Unfortunately patient reports that for the past several days he has been feeling unwell with progressive weakness.  He also reports having fever at home.  He pulled his right PICC line out and refused further IV antibiotics.  Home health nursing called infectious disease doctor who recommended that he take Zyvox  600 mg twice daily through end date of 03/20/2024 which was his original end date.  Patient and wife strongly felt that the treatments he was receiving was causing his progressive weakness and debility.  He has had poor oral intake over the past several days, poor appetite and reports that he is supposed to have his left ureteral stent removed by Dr. Sherrilee on Monday, 03/12/2024.  On arrival he was noted to be clinically dehydrated and ill-appearing.  His creatinine had risen to 6.87, up from 1.87 on 02/22/2024.  His hemoglobin is stable at 10.9.  His lactic acid was 2.0.  His CO2 was 16.  His AST was 59, ALT 13, GFR estimated at 8 mL/min.  His urinalysis was positive for hemoglobin, moderate leukocytes protein, rare bacteria 21-50 WBC per high-power field.  CT stone study  suggesting consolidation in the lung bases, bilateral renal stones and bladder stones, left ureteral stone in place than previously seen left ureteral stone no longer present.  Patient was given IV fluids and admission requested for AKI management.  Assessment and Plan: AKI on CKD stage 3b - baseline creatinine 1.8-2.2 - serum creatinine peaked 6.24 -- from poor oral intake from to painful yeast stomatitis  -- improved with IV fluid hydration -- per nephrology ok to DC IVF 12/22 -- DC foley cath 12/22 and follow urine output and monitor bladder scans - 12/24--lasix  IV 40 mg x 1 -- follow daily renal function labs--creatinine improved to 3.85>>3.80>>4.08>>4.02 - Patient has new baseline creatinine 3.8-4 point -Case discussed with nephrology, Dr. Maury for dc - Patient will follow-up with [facility] -SPEP, immunofixation, lambda and kappa light chains pending at the time of discharge   Generalized Weakness/Acute metabolic Encephalopathy -- PT eval requested >>SNF  -- B12-->4000 - folate 5.5>> replete - TSH--2.660 - d/c fentanyl , oxycodone , trazodone , benadryl  - use acetaminophen  only for pain - improved back to baseline with d/c hypnotic agents -03/17/2024-pt and spouse do not want to go to SNF.  They prefer to go home>>HHPT set up   Bilateral nephrolithiasis -- 12/17 CT abd/pelvis--Bilateral renal stones and tiny bladder stones. Double-J leftureteral stent in place with minimal prominence of the left ureter, unchanged. Previously seen mid left ureteral stone is no longervisualized. -- pt was due to have ureteral stent removed by Dr. Sherrilee on 03/12/24 -- notified Dr. Sherrilee and he said that his office will  schedule to have stent removed in the office  -03/16/24 repeat CT renal--Left double J ureteral stent in proper position, with no left hydronephrosis or hydroureter.   Aspiration pneumonia - IMPROVING -- ordered CT chest without contrast for further investigation and  work up -- 12/18 CT chest confirms multifocal pneumonia  -- completing course of augmentin  on 03/13/24 - 12/25--repeat CT chest--improvement in multifocal PNA   Hypercalcemia -- mild elevation, asymptomatic -- PTH = 7 --SPEP, immunofixation pending at time of d/c   Leukemoid reaction -12/25 CT chest--improving PNA - repeat blood culture -UA if possible - repeat blood culture--neg to date -03/16/24 repeat CT renal--Left double J ureteral stent in proper position, with no left hydronephrosis or hydroureter. - Remains afebrile hemodynamically stable   Oral Candidiasis--TREATED and IMPROVED  -- fluconazole  ordered and 2 separate doses given -- treated with nystatin  swish  -- he reports much improved symptoms and eating / drinking better    Essential hypertension  -- stable, follow -- resume metoprolol  succinate 25 mg daily --Will not restart amlodipine  as blood pressure is well-controlled   MRSE bacteremia -- pt had a PICC line and was on IV daptomycin  until 12/17 when he removed PICC line and refused further IV antibiotics -- the home heath RN had contacted ID physician and the recommendation was to start zyvox  600 mg BID to complete course of treatment thru 03/21/24 - 12/17 blood cultures neg   Anemia of chronic disease -- Hg stable from recent testing around 10 -- Hg may trend down from hemodilution s/p IV fluids   Metabolic Acidosis--resolved  -- secondary to worsening renal failure  -- sodium bicarbonate  IV infusion completed  -improved now   Bacteremia due to pseudomonas-completed treatment  -- pt reports he completed the course of antibiotics (cipro ) for this    Type 2 DM, controlled with renal complications -- SSI coverage with frequent CBG monitoring ordered - 12/23/23 A1C--6.4 -restart glipizide after d/c       Consultants: renal Procedures performed: none  Disposition: Home Diet recommendation:  Carb modified diet DISCHARGE MEDICATION: Allergies as of  03/17/2024       Reactions   Dilaudid [hydromorphone Hcl] Anaphylaxis, Other (See Comments)   Cardiac arrest        Medication List     STOP taking these medications    amLODipine  5 MG tablet Commonly known as: NORVASC    colchicine  0.6 MG tablet   DAPTOmycin  500 MG injection Commonly known as: CUBICIN    daptomycin  IVPB Commonly known as: CUBICIN    heparin  100 units/mL Soln   ibuprofen 200 MG tablet Commonly known as: ADVIL   indomethacin 25 MG capsule Commonly known as: INDOCIN   oxyCODONE -acetaminophen  5-325 MG tablet Commonly known as: PERCOCET/ROXICET   traMADol  50 MG tablet Commonly known as: ULTRAM        TAKE these medications    aspirin  EC 81 MG tablet Take 81 mg by mouth daily.   folic acid  1 MG tablet Commonly known as: FOLVITE  Take 1 tablet (1 mg total) by mouth daily. Start taking on: March 18, 2024   glipiZIDE 2.5 MG 24 hr tablet Commonly known as: GLUCOTROL XL Take 2.5 mg by mouth daily.   Lancets Misc 1 each by Does not apply route as directed. Dispense based on patient and insurance preference. Use up to four times daily as directed. (FOR ICD-10 E10.9, E11.9).   linezolid  600 MG tablet Commonly known as: ZYVOX  Take 1 tablet (600 mg total) by mouth 2 (two) times daily  for 14 days.   meclizine  25 MG tablet Commonly known as: ANTIVERT  Take 25 mg by mouth 2 (two) times daily.   metoprolol  succinate 25 MG 24 hr tablet Commonly known as: TOPROL -XL Take 1 tablet (25 mg total) by mouth daily. What changed: when to take this   OneTouch Ultra Test test strip Generic drug: glucose blood 1 each by Other route daily.   BLOOD GLUCOSE TEST STRIPS Strp 1 each by Does not apply route as directed. Dispense based on patient and insurance preference. Use up to four times daily as directed. (FOR ICD-10 E10.9, E11.9).   rosuvastatin  5 MG tablet Commonly known as: CRESTOR  Take 5 mg by mouth every Saturday.   tamsulosin  0.4 MG Caps  capsule Commonly known as: FLOMAX  Take 1 capsule (0.4 mg total) by mouth daily.   Vitamin B-12 2500 MCG Subl Place 2,500 mcg under the tongue daily.   VITAMIN D -3 PO Take 1 capsule by mouth daily.        Contact information for after-discharge care     Destination     [facility] .   Service: Skilled Nursing Contact information: [address] [facility] [place]  [address] [phone]                    Discharge Exam: Filed Weights   03/15/24 0323 03/16/24 0500 03/17/24 0444  Weight: 87.1 kg 94.1 kg 80.6 kg   HEENT:  [place]/AT, No thrush, no icterus CV:  RRR, no rub, no S3, no S4 Lung: There is no wheezing.  Bibasilar crackles. Abd:  soft/+BS, NT Ext: Trace lower extremity edema, no lymphangitis, no synovitis, no rash   Condition at discharge: stable  The results of significant diagnostics from this hospitalization (including imaging, microbiology, ancillary and laboratory) are listed below for reference.   Imaging Studies: CT RENAL STONE STUDY Result Date: 03/16/2024 EXAM: CT UROGRAM 03/16/2024 09:14:34 AM TECHNIQUE: CT of the abdomen and pelvis was performed without the administration of intravenous contrast as per CT urogram protocol. Multiplanar reformatted images as well as MIP urogram images are provided for review. Automated exposure control, iterative reconstruction, and/or weight based adjustment of the mA/kV was utilized to reduce the radiation dose to as low as reasonably achievable. COMPARISON: CT chest 03/08/2024, CT abdomen 03/07/2024. CLINICAL HISTORY: Increasing WBC, hx of left ureteral stent. FINDINGS: LOWER CHEST: Again demonstrated density bibasilar pulmonary infiltrates slightly improved from comparison exam. LIVER: The liver is unremarkable. GALLBLADDER AND BILE DUCTS: Gallbladder is unremarkable. No biliary ductal dilatation. SPLEEN: No acute abnormality. PANCREAS: No acute abnormality. ADRENAL GLANDS: No acute abnormality.  KIDNEYS, URETERS AND BLADDER: Left double J ureteral stent in proper position. No left hydronephrosis or hydroureter. There are bilateral nonobstructing renal calculi. No ureteral lithiasis. There are several small calculi within the dome of the bladder measuring 2 to 3 mm each. Bilateral simple fluid density renal lesions are unchanged. Per consensus, no follow-up is needed for simple Bosniak type 1 and 2 renal cysts, unless the patient has a malignancy history or risk factors. GI AND BOWEL: Stomach demonstrates no acute abnormality. There is no bowel obstruction. PERITONEUM AND RETROPERITONEUM: No ascites. No free air. VASCULATURE: Aorta is normal in caliber. LYMPH NODES: No lymphadenopathy. REPRODUCTIVE ORGANS: Multiple brachytherapy seeds positioned throughout the prostate. BONES AND SOFT TISSUES: No acute osseous abnormality. No focal soft tissue abnormality. IMPRESSION: 1. Left double J ureteral stent in proper position, with no left hydronephrosis or hydroureter. 2. Bibasilar pulmonary infiltrates, slightly improved from  the comparison exam. 3. Bilateral nonobstructing renal calculi, with no ureteral lithiasis. 4. Several small bladder calculi measuring 2 to 3 mm each. Electronically signed by: Norleen Boxer MD 03/16/2024 09:45 AM EST RP Workstation: [id number]   CT CHEST WO CONTRAST Result Date: 03/15/2024 EXAM: CT CHEST WITHOUT CONTRAST 03/15/2024 05:55:00 PM TECHNIQUE: CT of the chest was performed without the administration of intravenous contrast. Multiplanar reformatted images are provided for review. Automated exposure control, iterative reconstruction, and/or weight based adjustment of the mA/kV was utilized to reduce the radiation dose to as low as reasonably achievable. COMPARISON: CT 03/08/2024. CLINICAL HISTORY: Dyspnea, chronic, unclear etiology; Respiratory illness, nondiagnostic xray; pneumonia, dyspnea, increasing wbc. FINDINGS: MEDIASTINUM: Heart and pericardium are unremarkable. The  central airways are clear. Coronary artery and aortic atherosclerotic calcification. Dilated ascending aorta measuring 41 mm. LYMPH NODES: No mediastinal, hilar or axillary lymphadenopathy. LUNGS AND PLEURA: Slight improvement in peribronchovascular and peripheral consolidative opacities diffusely throughout both lungs when compared with 03/08/2024. No pleural effusion or pneumothorax. SOFT TISSUES/BONES: No acute abnormality of the bones or soft tissues. UPPER ABDOMEN: Limited images of the upper abdomen demonstrates no acute abnormality. IMPRESSION: 1. Slight improvement in multifocal pneumonia. 2. Dilated ascending aorta measuring 4.1 cm.Recommend annual imaging followup by CTA or MRA. This recommendation follows 2010 ACCF/AHA/AATS/ACR/ASA/SCA/SCAI/SIR/STS/SVM Guidelines for the Diagnosis and Management of Patients with Thoracic Aortic Disease. Circulation. 2010; 121: Z733-z630. Aortic aneurysm NOS (ICD10-I71.9) Electronically signed by: Norman Gatlin MD 03/15/2024 07:45 PM EST RP Workstation: [id number]   CT CHEST WO CONTRAST Result Date: 03/08/2024 EXAM: CT CHEST WITHOUT CONTRAST 03/08/2024 03:22:11 PM TECHNIQUE: CT of the chest was performed without the administration of intravenous contrast. Multiplanar reformatted images are provided for review. Automated exposure control, iterative reconstruction, and/or weight based adjustment of the mA/kV was utilized to reduce the radiation dose to as low as reasonably achievable. COMPARISON: CT and x-ray from 03/07/2024 and CT chest 10/30/2023. CLINICAL HISTORY: question of pneumonia (multifocal) FINDINGS: MEDIASTINUM: Heart and pericardium are unremarkable. The central airways are clear. LYMPH NODES: No mediastinal, hilar or axillary lymphadenopathy. LUNGS AND PLEURA: Peribronchovascular and peripheral consolidative opacities diffusely throughout both lungs. No pleural effusion. No pneumothorax. SOFT TISSUES/BONES: No acute abnormality of the bones or soft  tissues. UPPER ABDOMEN: Limited images of the upper abdomen demonstrate bilateral nonobstructing nephrolithiasis. IMPRESSION: 1. Peribronchovascular and peripheral consolidative opacities diffusely throughout both lungs, most consistent with multifocal pneumonia; differential includes but is not limited to: organizing pneumonia, aspiration pneumonitis, and drug/toxin-related pneumonitis. Prominent Electronically signed by: Norman Gatlin MD 03/08/2024 08:04 PM EST RP Workstation: [id number]   CT Renal Stone Study Result Date: 03/07/2024 CLINICAL DATA:  Abdominal/flank pain.  Weakness anorexia. EXAM: CT ABDOMEN AND PELVIS WITHOUT CONTRAST TECHNIQUE: Multidetector CT imaging of the abdomen and pelvis was performed following the standard protocol without IV contrast. RADIATION DOSE REDUCTION: This exam was performed according to the departmental dose-optimization program which includes automated exposure control, adjustment of the mA and/or kV according to patient size and/or use of iterative reconstruction technique. COMPARISON:  02/18/2024. FINDINGS: Lower chest: Severe patchy bilateral airspace consolidation in the lung bases, incompletely visualized. Heart is mildly enlarged. No pericardial or pleural effusion. Distal esophagus is grossly unremarkable. Hepatobiliary: Liver and gallbladder are unremarkable. No biliary ductal dilatation. Pancreas: Negative. Spleen: Negative. Adrenals/Urinary Tract: Adrenal glands are unremarkable. Bilateral renal stones. Low-attenuation lesions in the kidneys. No specific follow-up necessary. Right ureter is decompressed. Double-J left ureteral stent in place with the proximal loop in the left renal pelvis and distal loop in the  bladder. Minimal left ureteral prominence, similar to 02/18/2024. Previously seen mid left ureteral stone is no longer visualized. Tiny dependent stones in the bladder. Locules of non dependent air are presumably iatrogenic in etiology. Stomach/Bowel:  Stomach, small bowel, appendix and colon are unremarkable. Vascular/Lymphatic: Atherosclerotic calcification of the aorta. No pathologically enlarged lymph nodes. Reproductive: Brachytherapy seeds in the prostate. Other: No free fluid.  Mesenteries and peritoneum are unremarkable. Musculoskeletal: Degenerative changes in the spine. Osteopenia. Minimal grade 1 anterolisthesis of L4 on L5 and L5 on S1. Old T12 and L1 compression fractures. IMPRESSION: 1. Multifocal consolidation in the lung bases, indicative of pneumonia. Organizing pneumonia is not excluded. Please correlate clinically. Findings are only partially imaged. Consider CT chest without contrast in further evaluation, as clinically indicated. 2. Bilateral renal stones and tiny bladder stones. Double-J left ureteral stent in place with minimal prominence of the left ureter, unchanged. Previously seen mid left ureteral stone is no longer visualized. 3.  Aortic atherosclerosis (ICD10-I70.0). Electronically Signed   By: Newell Eke M.D.   On: 03/07/2024 14:37   DG Chest Port 1 View Result Date: 03/07/2024 CLINICAL DATA:  Worsening weakness over the past several days. Possible sepsis. EXAM: PORTABLE CHEST 1 VIEW COMPARISON:  02/18/2024 FINDINGS: Lungs are hypoinflated with patchy bilateral airspace opacification likely multifocal pneumonia as this is slightly worse. Possible small amount left pleural fluid unchanged. Cardiomediastinal silhouette and remainder of the exam is unchanged. IMPRESSION: Slight worsening patchy bilateral airspace process likely multifocal pneumonia. Possible small amount left pleural fluid unchanged. Electronically Signed   By: Toribio Agreste M.D.   On: 03/07/2024 13:19   DG C-Arm 1-60 Min-No Report Result Date: 03/01/2024 Fluoroscopy was utilized by the requesting physician.  No radiographic interpretation.   US  EKG SITE RITE Result Date: 02/23/2024 If Site Rite image not attached, placement could not be confirmed due to  current cardiac rhythm.  ECHOCARDIOGRAM LIMITED Result Date: 02/19/2024    ECHOCARDIOGRAM LIMITED REPORT   Patient Name:   CAROLD EISNER Cerezo Date of Exam: 02/19/2024 Medical Rec #:  [id number]         Height:       71.0 in Accession #:    [id number]        Weight:       238.8 lb Date of Birth:  12-17-1945          BSA:          2.273 m Patient Age:    78 years          BP:           121/52 mmHg Patient Gender: M                 HR:           70 bpm. Exam Location:  Inpatient Procedure: Limited Echo, Limited Color Doppler and Cardiac Doppler (Both            Spectral and Color Flow Doppler were utilized during procedure). Indications:    Sepsis  History:        Patient has prior history of Echocardiogram examinations, most                 recent 02/07/2024. Risk Factors:Hypertension, Diabetes and                 Dyslipidemia.  Sonographer:    Tinnie Barefoot RDCS Referring Phys: [id number] JAMIE H DAGENHART IMPRESSIONS  1. Left ventricular ejection fraction, by estimation, is 60 to  65%. The left ventricle has normal function. The left ventricle has no regional wall motion abnormalities.  2. Right ventricular systolic function is normal. The right ventricular size is normal. There is normal pulmonary artery systolic pressure. The estimated right ventricular systolic pressure is 28.0 mmHg.  3. The mitral valve is normal in structure. Trivial mitral valve regurgitation. No evidence of mitral stenosis.  4. The aortic valve is tricuspid. Aortic valve regurgitation is not visualized. Aortic valve sclerosis is present, with no evidence of aortic valve stenosis.  5. The inferior vena cava is normal in size with greater than 50% respiratory variability, suggesting right atrial pressure of 3 mmHg. FINDINGS  Left Ventricle: Left ventricular ejection fraction, by estimation, is 60 to 65%. The left ventricle has normal function. The left ventricle has no regional wall motion abnormalities. The left ventricular internal cavity  size was normal in size. There is  no left ventricular hypertrophy. Right Ventricle: The right ventricular size is normal. No increase in right ventricular wall thickness. Right ventricular systolic function is normal. There is normal pulmonary artery systolic pressure. The tricuspid regurgitant velocity is 2.50 m/s, and  with an assumed right atrial pressure of 3 mmHg, the estimated right ventricular systolic pressure is 28.0 mmHg. Left Atrium: Left atrial size was normal in size. Right Atrium: Right atrial size was normal in size. Pericardium: There is no evidence of pericardial effusion. Mitral Valve: The mitral valve is normal in structure. Trivial mitral valve regurgitation. No evidence of mitral valve stenosis. Tricuspid Valve: The tricuspid valve is normal in structure. Tricuspid valve regurgitation is trivial. No evidence of tricuspid stenosis. Aortic Valve: The aortic valve is tricuspid. Aortic valve regurgitation is not visualized. Aortic valve sclerosis is present, with no evidence of aortic valve stenosis. Pulmonic Valve: The pulmonic valve was normal in structure. Pulmonic valve regurgitation is trivial. No evidence of pulmonic stenosis. Aorta: The aortic root is normal in size and structure. Venous: The inferior vena cava is normal in size with greater than 50% respiratory variability, suggesting right atrial pressure of 3 mmHg. IAS/Shunts: No atrial level shunt detected by color flow Doppler. Additional Comments: Spectral Doppler performed. Color Doppler performed.  LEFT VENTRICLE PLAX 2D LVIDd:         3.70 cm LVIDs:         2.40 cm LV PW:         1.00 cm LV IVS:        1.10 cm  IVC IVC diam: 1.90 cm LEFT ATRIUM         Index LA diam:    3.60 cm 1.58 cm/m  AORTIC VALVE LVOT Vmax:   97.60 cm/s LVOT Vmean:  65.300 cm/s LVOT VTI:    0.233 m MITRAL VALVE               TRICUSPID VALVE MV Area (PHT): 2.34 cm    TR Peak grad:   25.0 mmHg MV Decel Time: 324 msec    TR Vmax:        250.00 cm/s MV E  velocity: 72.40 cm/s MV A velocity: 69.70 cm/s  SHUNTS MV E/A ratio:  1.04        Systemic VTI: 0.23 m Wilbert Bihari MD Electronically signed by Wilbert Bihari MD Signature Date/Time: 02/19/2024/11:15:39 AM    Final    DG Abd 1 View Result Date: 02/18/2024 EXAM: 1 VIEW XRAY OF THE ABDOMEN 02/18/2024 10:58:00 PM COMPARISON: 02/18/2024 CLINICAL HISTORY: Abdominal distention FINDINGS: BOWEL: Nonobstructive bowel gas pattern. SOFT  TISSUES: Left ureteral stent in place. Prostate brachytherapy seeds noted. 7 mm left mid ureteral calculus adjacent to the stent. 9 mm calculus projects over the right kidney. BONES: No acute osseous abnormality. IMPRESSION: 1. 7 mm left mid ureteral calculus adjacent to the stent. 2. 9 mm calculus projects over the right kidney. Electronically signed by: Norman Gatlin MD 02/18/2024 11:05 PM EST RP Workstation: [id number]   CT ABDOMEN PELVIS WO CONTRAST Result Date: 02/18/2024 CLINICAL DATA:  Left upper quadrant pain. EXAM: CT ABDOMEN AND PELVIS WITHOUT CONTRAST TECHNIQUE: Multidetector CT imaging of the abdomen and pelvis was performed following the standard protocol without IV contrast. RADIATION DOSE REDUCTION: This exam was performed according to the departmental dose-optimization program which includes automated exposure control, adjustment of the mA and/or kV according to patient size and/or use of iterative reconstruction technique. COMPARISON:  CT abdomen and pelvis 02/05/2024. FINDINGS: Lower chest: No acute abnormality. Hepatobiliary: No focal liver abnormality is seen. No gallstones, gallbladder wall thickening, or biliary dilatation. Pancreas: Unremarkable. No pancreatic ductal dilatation or surrounding inflammatory changes. Spleen: Normal in size without focal abnormality. Adrenals/Urinary Tract: There is a new left ureteral stent in place in proper position. There is no left-sided hydronephrosis. Stable position of calculus in the left mid ureter measuring 5 mm. Bilateral  renal calculi are otherwise stable. Bilateral renal cysts have not significantly changed. Adrenal glands are within normal limits. There is diffuse bladder wall thickening versus normal under distension. Stomach/Bowel: Stomach is within normal limits. No evidence of bowel wall thickening, distention, or inflammatory changes. The appendix is not visualized. Vascular/Lymphatic: Aortic atherosclerosis. No enlarged abdominal or pelvic lymph nodes. There are few nonenlarged left retroperitoneal lymph nodes, unchanged. Reproductive: Prostate radiotherapy seeds are present. Other: There are small fat containing inguinal hernias. There is no ascites. Musculoskeletal: The bones are osteopenic. Chronic compression deformities of T12 and L1 appear unchanged. IMPRESSION: 1. New left ureteral stent in place. No hydronephrosis. 2. Stable position of 5 mm calculus in the left mid ureter. 3. Stable bilateral renal calculi. 4. Diffuse bladder wall thickening versus normal under distension. Correlate clinically for cystitis. 5. Aortic atherosclerosis. Aortic Atherosclerosis (ICD10-I70.0). Electronically Signed   By: Greig Pique M.D.   On: 02/18/2024 19:54   DG Chest Port 1 View Result Date: 02/18/2024 CLINICAL DATA:  Sepsis EXAM: PORTABLE CHEST 1 VIEW COMPARISON:  Chest x-ray 02/05/2024 FINDINGS: Right upper extremity PICC terminates in the SVC. The heart size and mediastinal contours are within normal limits. Both lungs are clear. The visualized skeletal structures are unremarkable. IMPRESSION: No active disease. Electronically Signed   By: Greig Pique M.D.   On: 02/18/2024 18:42    Microbiology: Results for orders placed or performed during the hospital encounter of 03/07/24  Blood culture (routine x 2)     Status: None   Collection Time: 03/07/24 12:16 PM   Specimen: BLOOD  Result Value Ref Range Status   Specimen Description BLOOD LEFT ANTECUBITAL  Final   Special Requests   Final    BOTTLES DRAWN AEROBIC AND  ANAEROBIC Blood Culture adequate volume   Culture   Final    NO GROWTH 5 DAYS Performed at [facility], [address], [place], [place] [address]    Report Status 03/12/2024 FINAL  Final  Blood culture (routine x 2)     Status: None   Collection Time: 03/07/24 12:30 PM   Specimen: BLOOD RIGHT ARM  Result Value Ref Range Status   Specimen Description BLOOD RIGHT ARM  Final  Special Requests   Final    BOTTLES DRAWN AEROBIC ONLY Blood Culture adequate volume   Culture   Final    NO GROWTH 5 DAYS Performed at [facility], [address], [place], [place] [address]    Report Status 03/12/2024 FINAL  Final  Urine Culture     Status: None   Collection Time: 03/07/24 12:46 PM   Specimen: Urine, Random  Result Value Ref Range Status   Specimen Description   Final    URINE, RANDOM Performed at [facility], [address], [place], [place] [address]    Special Requests   Final    NONE Reflexed from [id number] Performed at [facility], [address] [place], [place] [address]    Culture   Final    NO GROWTH Performed at [facility], [address] [place], [place] [address]    Report Status 03/08/2024 FINAL  Final    Labs: CBC: Recent Labs  Lab 03/11/24 0424 03/12/24 0449 03/14/24 0431 03/15/24 0438 03/16/24 0703  WBC 7.6 8.4 11.0* 11.4* 12.9*  HGB 9.2* 9.4* 9.4* 9.4* 9.6*  HCT 29.4* 30.1* 29.3* 29.7* 30.8*  MCV 91.6 91.5 89.9 90.0 93.1  PLT 439* 500* 493* 453* 402*   Basic Metabolic Panel: Recent Labs  Lab 03/12/24 0449 03/13/24 0444 03/14/24 0431 03/14/24 0432 03/15/24 0438 03/16/24 0506 03/17/24 0500  NA 136 136  --  136 138 139 133*  K 4.1 4.0  --  3.9 4.1 4.1 4.3  CL 99 98  --  99 99 99 95*  CO2 26 25  --  23 25 26 26   GLUCOSE 138* 123*  --  136* 131* 122* 119*  BUN 57* 57*  --  58* 56* 61* 60*  CREATININE 4.22* 4.02* 3.69* 3.85* 3.80* 4.08* 4.02*  CALCIUM  10.8* 11.0*  --  11.0* 10.8* 11.1* 10.2  MG 1.9  --   --   --   --  2.3  --   PHOS 4.0 4.3   --  4.6 4.8* 6.2* 5.3*   Liver Function Tests: Recent Labs  Lab 03/13/24 0444 03/14/24 0432 03/15/24 0438 03/16/24 0506 03/17/24 0500  AST  --   --   --  31  --   ALT  --   --   --  15  --   ALKPHOS  --   --   --  112  --   BILITOT  --   --   --  1.0  --   PROT  --   --   --  8.0  --   ALBUMIN 3.0* 3.1* 3.2* 3.5 3.4*   CBG: Recent Labs  Lab 03/16/24 1623 03/16/24 2015 03/17/24 0259 03/17/24 0735 03/17/24 1136  GLUCAP 144* 121* 108* 123* 120*    Discharge time spent: greater than 30 minutes.  Signed: Alm Schneider, MD [facility] 03/17/2024 "

## 2024-03-18 LAB — CALCITRIOL (1,25 DI-OH VIT D): Vit D, 1,25-Dihydroxy: 56.1 pg/mL (ref 24.8–81.5)

## 2024-03-19 LAB — PROTEIN ELECTROPHORESIS, SERUM
A/G Ratio: 0.7 (ref 0.7–1.7)
Albumin ELP: 3 g/dL (ref 2.9–4.4)
Alpha-1-Globulin: 0.4 g/dL (ref 0.0–0.4)
Alpha-2-Globulin: 1.3 g/dL — ABNORMAL HIGH (ref 0.4–1.0)
Beta Globulin: 1.5 g/dL — ABNORMAL HIGH (ref 0.7–1.3)
Gamma Globulin: 1.2 g/dL (ref 0.4–1.8)
Globulin, Total: 4.4 g/dL — ABNORMAL HIGH (ref 2.2–3.9)
Total Protein ELP: 7.4 g/dL (ref 6.0–8.5)

## 2024-03-19 LAB — KAPPA/LAMBDA LIGHT CHAINS
Kappa free light chain: 130.7 mg/L — ABNORMAL HIGH (ref 3.3–19.4)
Kappa, lambda light chain ratio: 0.78 (ref 0.26–1.65)
Lambda free light chains: 167.3 mg/L — ABNORMAL HIGH (ref 5.7–26.3)

## 2024-03-20 LAB — IMMUNOFIXATION ELECTROPHORESIS
IgA: 950 mg/dL — ABNORMAL HIGH (ref 61–437)
IgG (Immunoglobin G), Serum: 1468 mg/dL (ref 603–1613)
IgM (Immunoglobulin M), Srm: 77 mg/dL (ref 15–143)
Total Protein ELP: 7.2 g/dL (ref 6.0–8.5)

## 2024-03-21 LAB — CULTURE, BLOOD (ROUTINE X 2)
Culture: NO GROWTH
Culture: NO GROWTH
Special Requests: ADEQUATE

## 2024-03-25 LAB — PTH-RELATED PEPTIDE: PTH-related peptide: <2 pmol/L

## 2024-03-26 ENCOUNTER — Other Ambulatory Visit (HOSPITAL_COMMUNITY): Admission: RE | Admit: 2024-03-26 | Source: Ambulatory Visit

## 2024-04-02 ENCOUNTER — Ambulatory Visit: Admitting: Urology

## 2024-04-03 ENCOUNTER — Telehealth: Payer: Self-pay

## 2024-04-03 NOTE — Telephone Encounter (Signed)
 Patient called to reschedule missed appointment due to being sick, patient was rescheduled for stent removal on 04/06/2024 @ 8:30a patient confirmed appointment

## 2024-04-06 ENCOUNTER — Ambulatory Visit: Admitting: Urology

## 2024-04-06 DIAGNOSIS — C61 Malignant neoplasm of prostate: Secondary | ICD-10-CM

## 2024-04-06 DIAGNOSIS — R339 Retention of urine, unspecified: Secondary | ICD-10-CM

## 2024-04-06 DIAGNOSIS — N3000 Acute cystitis without hematuria: Secondary | ICD-10-CM | POA: Diagnosis not present

## 2024-04-06 LAB — URINALYSIS, ROUTINE W REFLEX MICROSCOPIC
Bilirubin, UA: NEGATIVE
Glucose, UA: NEGATIVE
Nitrite, UA: NEGATIVE
Specific Gravity, UA: 1.025 (ref 1.005–1.030)
Urobilinogen, Ur: 0.2 mg/dL (ref 0.2–1.0)
pH, UA: 6 (ref 5.0–7.5)

## 2024-04-06 LAB — MICROSCOPIC EXAMINATION
RBC, Urine: >30 /HPF — AB (ref 0–2)
WBC, UA: >30 /HPF — AB (ref 0–5)

## 2024-04-06 MED ORDER — CLOTRIMAZOLE-BETAMETHASONE 1-0.05 % EX CREA: 1.0000 | TOPICAL_CREAM | Freq: Two times a day (BID) | CUTANEOUS | 3 refills | Status: AC

## 2024-04-06 MED ORDER — DOXYCYCLINE HYCLATE 100 MG PO CAPS: 100.0000 mg | ORAL_CAPSULE | Freq: Two times a day (BID) | ORAL | 0 refills | Status: AC

## 2024-04-06 NOTE — Progress Notes (Unsigned)
 "  04/06/2024 9:24 AM   Marcus Simpson February 04, 1946 [id number]  Referring provider: [name], MD [address] [place],  [place] [address]  No chief complaint on file.   HPI:    PMH: Past Medical History:  Diagnosis Date   Arthritis    Bradycardia    asymptomatic    Diabetes mellitus without complication (HCC)    History of kidney stones    Hypertension    Prostate cancer [facility])     Surgical History: Past Surgical History:  Procedure Laterality Date   COLONOSCOPY     CYSTOSCOPY WITH STENT PLACEMENT Right 12/28/2022   Procedure: CYSTOSCOPY WITH STENT PLACEMENT;  Surgeon: Carolee Sherwood BIRCH DOUGLAS, MD;  Location: [facility] OR;  Service: Urology;  Laterality: Right;   CYSTOSCOPY WITH STENT PLACEMENT Left 02/05/2024   Procedure: CYSTOSCOPY, WITH STENT INSERTION. URETHRAL DILATION;  Surgeon: Shane Steffan BROCKS, MD;  Location: [facility] ORS;  Service: Urology;  Laterality: Left;   CYSTOSCOPY/RETROGRADE/URETEROSCOPY/STONE EXTRACTION WITH BASKET Left 03/01/2024   Procedure: CYSTOSCOPY, WITH CALCULUS REMOVAL USING BASKET;  Surgeon: Sherrilee Belvie CROME, MD;  Location: [facility];  Service: Urology;  Laterality: Left;   CYSTOSCOPY/URETEROSCOPY/HOLMIUM LASER/STENT PLACEMENT Left 03/01/2024   Procedure: CYSTOSCOPY/URETEROSCOPY/HOLMIUM LASER/STENT PLACEMENT;  Surgeon: Sherrilee Belvie CROME, MD;  Location: [facility];  Service: Urology;  Laterality: Left;   EXTRACORPOREAL SHOCK WAVE LITHOTRIPSY Right 01/25/2023   Procedure: EXTRACORPOREAL SHOCK WAVE LITHOTRIPSY (ESWL);  Surgeon: [name], MD;  Location: [facility];  Service: Urology;  Laterality: Right;   FLEXIBLE SIGMOIDOSCOPY N/A 04/02/2015   Procedure: FLEXIBLE SIGMOIDOSCOPY;  Surgeon: Claudis RAYMOND Rivet, MD;  Location: [facility];  Service: Endoscopy;  Laterality: N/A;  1:45 - moved to 1/11 @ 8:30 - Ann to notify   HOLMIUM LASER APPLICATION Left 03/01/2024   Procedure: HOLMIUM LASER APPLICATION;  Surgeon: Sherrilee Belvie CROME, MD;  Location: [facility];  Service:  Urology;  Laterality: Left;   KIDNEY STONE SURGERY     RADIOACTIVE SEED IMPLANT N/A 12/29/2023   Procedure: INSERTION, RADIATION SOURCE, PROSTATE;  Surgeon: Sherrilee Belvie CROME, MD;  Location: [facility];  Service: Urology;  Laterality: N/A;   SPACE OAR INSTILLATION N/A 12/29/2023   Procedure: INJECTION, HYDROGEL SPACER;  Surgeon: Sherrilee Belvie CROME, MD;  Location: [facility];  Service: Urology;  Laterality: N/A;   TOTAL KNEE ARTHROPLASTY Left 05/02/2017   Procedure: LEFT TOTAL KNEE ARTHROPLASTY;  Surgeon: Melodi Lerner, MD;  Location: [facility];  Service: Orthopedics;  Laterality: Left;    Home Medications:  Allergies as of 04/06/2024       Reactions   Dilaudid [hydromorphone Hcl] Anaphylaxis, Other (See Comments)   Cardiac arrest        Medication List        Accurate as of April 06, 2024  9:24 AM. If you have any questions, ask your nurse or doctor.          aspirin  EC 81 MG tablet Take 81 mg by mouth daily.   folic acid  1 MG tablet Commonly known as: FOLVITE  Take 1 tablet (1 mg total) by mouth daily.   glipiZIDE 2.5 MG 24 hr tablet Commonly known as: GLUCOTROL XL Take 2.5 mg by mouth daily.   Lancets Misc 1 each by Does not apply route as directed. Dispense based on patient and insurance preference. Use up to four times daily as directed. (FOR ICD-10 E10.9, E11.9).   meclizine  25 MG tablet Commonly known as: ANTIVERT  Take 25 mg by mouth 2 (two) times daily.   metoprolol   succinate 25 MG 24 hr tablet Commonly known as: TOPROL -XL Take 1 tablet (25 mg total) by mouth daily.   OneTouch Ultra Test test strip Generic drug: glucose blood 1 each by Other route daily.   BLOOD GLUCOSE TEST STRIPS Strp 1 each by Does not apply route as directed. Dispense based on patient and insurance preference. Use up to four times daily as directed. (FOR ICD-10 E10.9, E11.9).   rosuvastatin  5 MG tablet Commonly known as: CRESTOR  Take 5 mg by mouth every Saturday.   tamsulosin  0.4 MG  Caps capsule Commonly known as: FLOMAX  Take 1 capsule (0.4 mg total) by mouth daily.   Vitamin B-12 2500 MCG Subl Place 2,500 mcg under the tongue daily.   VITAMIN D -3 PO Take 1 capsule by mouth daily.        Allergies: Allergies[1]  Family History: No family history on file.  Social History:  reports that he has quit smoking. His smoking use included cigarettes. He has never used smokeless tobacco. He reports that he does not drink alcohol and does not use drugs.  ROS: All other review of systems were reviewed and are negative except what is noted above in HPI  Physical Exam: There were no vitals taken for this visit.  Constitutional:  Alert and oriented, No acute distress. HEENT: [place] AT, moist mucus membranes.  Trachea midline, no masses. Cardiovascular: No clubbing, cyanosis, or edema. Respiratory: Normal respiratory effort, no increased work of breathing. GI: Abdomen is soft, nontender, nondistended, no abdominal masses GU: No CVA tenderness.  Lymph: No cervical or inguinal lymphadenopathy. Skin: No rashes, bruises or suspicious lesions. Neurologic: Grossly intact, no focal deficits, moving all 4 extremities. Psychiatric: Normal mood and affect.  Laboratory Data: Lab Results  Component Value Date   WBC 12.9 (H) 03/16/2024   HGB 9.6 (L) 03/16/2024   HCT 30.8 (L) 03/16/2024   MCV 93.1 03/16/2024   PLT 402 (H) 03/16/2024    Lab Results  Component Value Date   CREATININE 4.02 (H) 03/17/2024    No results found for: PSA  No results found for: TESTOSTERONE  Lab Results  Component Value Date   HGBA1C 6.4 (H) 12/23/2023    Urinalysis    Component Value Date/Time   COLORURINE YELLOW 03/08/2024 0933   APPEARANCEUR CLEAR 03/08/2024 0933   APPEARANCEUR Clear 05/17/2023 1312   LABSPEC 1.011 03/08/2024 0933   PHURINE 6.0 03/08/2024 0933   GLUCOSEU NEGATIVE 03/08/2024 0933   HGBUR LARGE (A) 03/08/2024 0933   BILIRUBINUR NEGATIVE 03/08/2024 0933    BILIRUBINUR Negative 05/17/2023 1312   KETONESUR NEGATIVE 03/08/2024 0933   PROTEINUR 30 (A) 03/08/2024 0933   NITRITE NEGATIVE 03/08/2024 0933   LEUKOCYTESUR MODERATE (A) 03/08/2024 0933    Lab Results  Component Value Date   LABMICR See below: 05/17/2023   WBCUA 6-10 (A) 05/17/2023   LABEPIT 0-10 05/17/2023   MUCUS Present 11/04/2020   BACTERIA RARE (A) 03/08/2024    Pertinent Imaging: *** Results for orders placed during the hospital encounter of 02/18/24  DG Abd 1 View  Narrative EXAM: 1 VIEW XRAY OF THE ABDOMEN 02/18/2024 10:58:00 PM  COMPARISON: 02/18/2024  CLINICAL HISTORY: Abdominal distention  FINDINGS:  BOWEL: Nonobstructive bowel gas pattern.  SOFT TISSUES: Left ureteral stent in place. Prostate brachytherapy seeds noted. 7 mm left mid ureteral calculus adjacent to the stent. 9 mm calculus projects over the right kidney.  BONES: No acute osseous abnormality.  IMPRESSION: 1. 7 mm left mid ureteral calculus adjacent to the stent. 2.  9 mm calculus projects over the right kidney.  Electronically signed by: Norman Gatlin MD 02/18/2024 11:05 PM EST RP Workstation: [id number]  No results found for this or any previous visit.  No results found for this or any previous visit.  No results found for this or any previous visit.  No results found for this or any previous visit.  No results found for this or any previous visit.  No results found for this or any previous visit.  Results for orders placed during the hospital encounter of 03/07/24  CT RENAL STONE STUDY  Narrative EXAM: CT UROGRAM 03/16/2024 09:14:34 AM  TECHNIQUE: CT of the abdomen and pelvis was performed without the administration of intravenous contrast as per CT urogram protocol. Multiplanar reformatted images as well as MIP urogram images are provided for review. Automated exposure control, iterative reconstruction, and/or weight based adjustment of the mA/kV was utilized  to reduce the radiation dose to as low as reasonably achievable.  COMPARISON: CT chest 03/08/2024, CT abdomen 03/07/2024.  CLINICAL HISTORY: Increasing WBC, hx of left ureteral stent.  FINDINGS:  LOWER CHEST: Again demonstrated density bibasilar pulmonary infiltrates slightly improved from comparison exam.  LIVER: The liver is unremarkable.  GALLBLADDER AND BILE DUCTS: Gallbladder is unremarkable. No biliary ductal dilatation.  SPLEEN: No acute abnormality.  PANCREAS: No acute abnormality.  ADRENAL GLANDS: No acute abnormality.  KIDNEYS, URETERS AND BLADDER: Left double J ureteral stent in proper position. No left hydronephrosis or hydroureter. There are bilateral nonobstructing renal calculi. No ureteral lithiasis. There are several small calculi within the dome of the bladder measuring 2 to 3 mm each. Bilateral simple fluid density renal lesions are unchanged. Per consensus, no follow-up is needed for simple Bosniak type 1 and 2 renal cysts, unless the patient has a malignancy history or risk factors.  GI AND BOWEL: Stomach demonstrates no acute abnormality. There is no bowel obstruction.  PERITONEUM AND RETROPERITONEUM: No ascites. No free air.  VASCULATURE: Aorta is normal in caliber.  LYMPH NODES: No lymphadenopathy.  REPRODUCTIVE ORGANS: Multiple brachytherapy seeds positioned throughout the prostate.  BONES AND SOFT TISSUES: No acute osseous abnormality. No focal soft tissue abnormality.  IMPRESSION: 1. Left double J ureteral stent in proper position, with no left hydronephrosis or hydroureter. 2. Bibasilar pulmonary infiltrates, slightly improved from the comparison exam. 3. Bilateral nonobstructing renal calculi, with no ureteral lithiasis. 4. Several small bladder calculi measuring 2 to 3 mm each.  Electronically signed by: Norleen Boxer MD 03/16/2024 09:45 AM EST RP Workstation: [id number]   Assessment & Plan:    1.  Acute cystitis without  hematuria Urine for culture -doxycycline  100mg  BID for 7 days - Urine Culture   No follow-ups on file.  [name], MD  [facility] [place]      [1]  Allergies Allergen Reactions   Dilaudid [Hydromorphone Hcl] Anaphylaxis and Other (See Comments)    Cardiac arrest   "

## 2024-04-09 ENCOUNTER — Other Ambulatory Visit: Payer: Self-pay

## 2024-04-09 ENCOUNTER — Inpatient Hospital Stay (HOSPITAL_COMMUNITY)
Admission: EM | Admit: 2024-04-09 | Discharge: 2024-04-12 | DRG: 683 | Disposition: A | Discharge status: Home / Self Care | Attending: Hospitalist | Admitting: Hospitalist

## 2024-04-09 ENCOUNTER — Encounter (HOSPITAL_COMMUNITY): Payer: Self-pay | Admitting: Emergency Medicine

## 2024-04-09 ENCOUNTER — Emergency Department (HOSPITAL_COMMUNITY)

## 2024-04-09 ENCOUNTER — Telehealth: Payer: Self-pay

## 2024-04-09 DIAGNOSIS — N200 Calculus of kidney: Secondary | ICD-10-CM

## 2024-04-09 DIAGNOSIS — N179 Acute kidney failure, unspecified: Secondary | ICD-10-CM | POA: Diagnosis present

## 2024-04-09 DIAGNOSIS — E860 Dehydration: Principal | ICD-10-CM

## 2024-04-09 DIAGNOSIS — R112 Nausea with vomiting, unspecified: Secondary | ICD-10-CM

## 2024-04-09 DIAGNOSIS — R531 Weakness: Secondary | ICD-10-CM

## 2024-04-09 DIAGNOSIS — E785 Hyperlipidemia, unspecified: Secondary | ICD-10-CM | POA: Diagnosis present

## 2024-04-09 DIAGNOSIS — C61 Malignant neoplasm of prostate: Secondary | ICD-10-CM | POA: Diagnosis present

## 2024-04-09 DIAGNOSIS — R5381 Other malaise: Secondary | ICD-10-CM

## 2024-04-09 DIAGNOSIS — I10 Essential (primary) hypertension: Secondary | ICD-10-CM | POA: Diagnosis present

## 2024-04-09 DIAGNOSIS — E872 Acidosis, unspecified: Secondary | ICD-10-CM | POA: Diagnosis present

## 2024-04-09 LAB — COMPREHENSIVE METABOLIC PANEL WITH GFR
ALT: 8 U/L (ref 0–44)
AST: 20 U/L (ref 15–41)
Albumin: 3.9 g/dL (ref 3.5–5.0)
Alkaline Phosphatase: 107 U/L (ref 38–126)
Anion gap: 23 — ABNORMAL HIGH (ref 5–15)
BUN: 89 mg/dL — ABNORMAL HIGH (ref 8–23)
CO2: 14 mmol/L — ABNORMAL LOW (ref 22–32)
Calcium: 9.8 mg/dL (ref 8.9–10.3)
Chloride: 102 mmol/L (ref 98–111)
Creatinine, Ser: 5.07 mg/dL — ABNORMAL HIGH (ref 0.61–1.24)
GFR, Estimated: 11 mL/min — ABNORMAL LOW
Glucose, Bld: 120 mg/dL — ABNORMAL HIGH (ref 70–99)
Potassium: 4.5 mmol/L (ref 3.5–5.1)
Sodium: 139 mmol/L (ref 135–145)
Total Bilirubin: 0.7 mg/dL (ref 0.0–1.2)
Total Protein: 8.3 g/dL — ABNORMAL HIGH (ref 6.5–8.1)

## 2024-04-09 LAB — URINALYSIS, ROUTINE W REFLEX MICROSCOPIC
Bilirubin Urine: NEGATIVE
Glucose, UA: NEGATIVE mg/dL
Ketones, ur: NEGATIVE mg/dL
Nitrite: NEGATIVE
Protein, ur: 100 mg/dL — AB
RBC / HPF: >50 RBC/hpf (ref 0–5)
Specific Gravity, Urine: 1.012 (ref 1.005–1.030)
WBC, UA: >50 WBC/hpf (ref 0–5)
pH: 5 (ref 5.0–8.0)

## 2024-04-09 LAB — CBC WITH DIFFERENTIAL/PLATELET
Abs Immature Granulocytes: 0.08 K/uL — ABNORMAL HIGH (ref 0.00–0.07)
Basophils Absolute: 0.1 K/uL (ref 0.0–0.1)
Basophils Relative: 1 %
Eosinophils Absolute: 0.1 K/uL (ref 0.0–0.5)
Eosinophils Relative: 1 %
HCT: 43.2 % (ref 39.0–52.0)
Hemoglobin: 13.3 g/dL (ref 13.0–17.0)
Immature Granulocytes: 1 %
Lymphocytes Relative: 18 %
Lymphs Abs: 1.8 K/uL (ref 0.7–4.0)
MCH: 28.4 pg (ref 26.0–34.0)
MCHC: 30.8 g/dL (ref 30.0–36.0)
MCV: 92.1 fL (ref 80.0–100.0)
Monocytes Absolute: 1.2 K/uL — ABNORMAL HIGH (ref 0.1–1.0)
Monocytes Relative: 11 %
Neutro Abs: 7.2 K/uL (ref 1.7–7.7)
Neutrophils Relative %: 68 %
Platelets: 264 K/uL (ref 150–400)
RBC: 4.69 MIL/uL (ref 4.22–5.81)
RDW: 18.9 % — ABNORMAL HIGH (ref 11.5–15.5)
WBC: 10.4 K/uL (ref 4.0–10.5)
nRBC: 0 % (ref 0.0–0.2)

## 2024-04-09 LAB — LIPASE, BLOOD: Lipase: 104 U/L — ABNORMAL HIGH (ref 11–51)

## 2024-04-09 LAB — MAGNESIUM: Magnesium: 2.1 mg/dL (ref 1.7–2.4)

## 2024-04-09 LAB — LACTIC ACID, PLASMA
Lactic Acid, Venous: 2.2 mmol/L (ref 0.5–1.9)
Lactic Acid, Venous: 1.9 mmol/L (ref 0.5–1.9)

## 2024-04-09 MED ORDER — SODIUM CHLORIDE 0.9 % IV BOLUS
1000.0000 mL | Freq: Once | INTRAVENOUS | Status: AC
  Administered 2024-04-09: 1000 mL via INTRAVENOUS

## 2024-04-09 MED ORDER — HEPARIN SODIUM (PORCINE) 5000 UNIT/ML IJ SOLN
5000.0000 [IU] | Freq: Three times a day (TID) | INTRAMUSCULAR | Status: DC
  Administered 2024-04-10 – 2024-04-11 (×2): 5000 [IU] via SUBCUTANEOUS
  Filled 2024-04-09 (×2): qty 1

## 2024-04-09 MED ORDER — SODIUM CHLORIDE 0.9 % IV SOLN
1.0000 g | Freq: Once | INTRAVENOUS | Status: AC
  Administered 2024-04-09: 1 g via INTRAVENOUS
  Filled 2024-04-09: qty 10

## 2024-04-09 MED ORDER — ONDANSETRON HCL 4 MG/2ML IJ SOLN
4.0000 mg | Freq: Once | INTRAMUSCULAR | Status: AC
  Administered 2024-04-09: 4 mg via INTRAVENOUS
  Filled 2024-04-09: qty 2

## 2024-04-09 NOTE — Telephone Encounter (Signed)
 Leo from [facility] called about pt is vomiting while taking abt. Tried returning [place] call with no answer, LVM.

## 2024-04-09 NOTE — ED Triage Notes (Signed)
 Pt started vomiting about one hour ago.

## 2024-04-09 NOTE — ED Provider Notes (Signed)
 " [place] EMERGENCY DEPARTMENT AT [facility] Provider Note   CSN: [id number] Arrival date & time: 04/09/24  8191     Patient presents with: Emesis   Marcus Simpson is a 79 y.o. male.   Pt is a 79 yo male with pmhx significant for DM2, CKD, chronic anemia, hx of pyelo with left ureteral stone s/p stent.  He did have bacteremia and a PICC line was placed, but he's finished abx now.  Upon Epic review, it looks like pt pulled out his PICC line and refused further abx although ID recommended Zyvox  until 12/30.  Pt's stent is still in place.  Pt was supposed to go to SNF at d/c, but he refused.  He said he's not been doing well since d/c.  Today, he called EMS due to n/v.  No fevers.  He feels weak.         Prior to Admission medications  Medication Sig Start Date End Date Taking? Authorizing Provider  aspirin  EC 81 MG tablet Take 81 mg by mouth daily.    [provider]  Cholecalciferol  (VITAMIN D -3 PO) Take 1 capsule by mouth daily.    [provider]  clotrimazole -betamethasone  (LOTRISONE ) cream Apply 1 Application topically 2 (two) times daily. 04/06/24   McKenzie, Belvie CROME, MD  Cyanocobalamin  (VITAMIN B-12) 2500 MCG SUBL Place 2,500 mcg under the tongue daily.    [provider]  doxycycline  (VIBRAMYCIN ) 100 MG capsule Take 1 capsule (100 mg total) by mouth every 12 (twelve) hours. 04/06/24   McKenzie, Belvie CROME, MD  folic acid  (FOLVITE ) 1 MG tablet Take 1 tablet (1 mg total) by mouth daily. 03/18/24   Evonnie Lenis, MD  glipiZIDE (GLUCOTROL XL) 2.5 MG 24 hr tablet Take 2.5 mg by mouth daily. 11/26/22   [provider]  Glucose Blood (BLOOD GLUCOSE TEST STRIPS) STRP 1 each by Does not apply route as directed. Dispense based on patient and insurance preference. Use up to four times daily as directed. (FOR ICD-10 E10.9, E11.9). 02/13/24   Sebastian Toribio GAILS, MD  Lancets MISC 1 each by Does not apply route as directed. Dispense based on patient  and insurance preference. Use up to four times daily as directed. (FOR ICD-10 E10.9, E11.9). 02/13/24   Sebastian Toribio GAILS, MD  meclizine  (ANTIVERT ) 25 MG tablet Take 25 mg by mouth 2 (two) times daily.    [provider]  metoprolol  succinate (TOPROL -XL) 25 MG 24 hr tablet Take 1 tablet (25 mg total) by mouth daily. 03/17/24   Evonnie Lenis, MD  [facility] ULTRA TEST test strip 1 each by Other route daily. 02/13/24   Sebastian Toribio GAILS, MD  rosuvastatin  (CRESTOR ) 5 MG tablet Take 5 mg by mouth every Saturday.    [provider]  tamsulosin  (FLOMAX ) 0.4 MG CAPS capsule Take 1 capsule (0.4 mg total) by mouth daily. Patient not taking: Reported on 03/08/2024 02/03/24   Sherrilee Belvie CROME, MD    Allergies: Dilaudid [hydromorphone hcl]    Review of Systems  Gastrointestinal:  Positive for nausea and vomiting.  Neurological:  Positive for weakness.  All other systems reviewed and are negative.   Updated Vital Signs BP 135/80   Pulse 83   Temp 97.6 F (36.4 C) (Oral)   Resp (!) 26   Ht 5' 11 (1.803 m)   Wt 80.6 kg   SpO2 98%   BMI 24.78 kg/m   Physical Exam Vitals and nursing note reviewed.  Constitutional:  Appearance: Normal appearance. He is ill-appearing.  HENT:     Head: Normocephalic and atraumatic.     Right Ear: External ear normal.     Left Ear: External ear normal.     Nose: Nose normal.     Mouth/Throat:     Mouth: Mucous membranes are dry.  Eyes:     Extraocular Movements: Extraocular movements intact.     Conjunctiva/sclera: Conjunctivae normal.     Pupils: Pupils are equal, round, and reactive to light.  Cardiovascular:     Rate and Rhythm: Regular rhythm. Tachycardia present.     Pulses: Normal pulses.     Heart sounds: Normal heart sounds.  Pulmonary:     Effort: Pulmonary effort is normal.     Breath sounds: Normal breath sounds.  Abdominal:     General: Abdomen is flat. Bowel sounds are normal.     Palpations: Abdomen is soft.   Musculoskeletal:     Cervical back: Normal range of motion and neck supple.  Skin:    General: Skin is warm and dry.     Capillary Refill: Capillary refill takes less than 2 seconds.  Neurological:     General: No focal deficit present.     Mental Status: He is alert and oriented to person, place, and time.  Psychiatric:        Mood and Affect: Mood normal.        Behavior: Behavior normal.        Thought Content: Thought content normal.        Judgment: Judgment normal.     (all labs ordered are listed, but only abnormal results are displayed) Labs Reviewed  CBC WITH DIFFERENTIAL/PLATELET - Abnormal; Notable for the following components:      Result Value   RDW 18.9 (*)    Monocytes Absolute 1.2 (*)    Abs Immature Granulocytes 0.08 (*)    All other components within normal limits  LACTIC ACID, PLASMA - Abnormal; Notable for the following components:   Lactic Acid, Venous 2.2 (*)    All other components within normal limits  COMPREHENSIVE METABOLIC PANEL WITH GFR - Abnormal; Notable for the following components:   CO2 14 (*)    Glucose, Bld 120 (*)    BUN 89 (*)    Creatinine, Ser 5.07 (*)    Total Protein 8.3 (*)    GFR, Estimated 11 (*)    Anion gap 23 (*)    All other components within normal limits  LIPASE, BLOOD - Abnormal; Notable for the following components:   Lipase 104 (*)    All other components within normal limits  LACTIC ACID, PLASMA  MAGNESIUM   URINALYSIS, ROUTINE W REFLEX MICROSCOPIC    EKG: EKG Interpretation Date/Time:  Monday April 09 2024 21:36:38 EST Ventricular Rate:  87 PR Interval:  138 QRS Duration:  103 QT Interval:  340 QTC Calculation: 409 R Axis:   18  Text Interpretation: Sinus rhythm Abnormal R-wave progression, early transition Nonspecific T abnormalities, lateral leads Since last tracing rate slower Confirmed by Dean Clarity [phone]) on 04/09/2024 9:43:53 PM  Radiology: CT ABDOMEN PELVIS WO CONTRAST Result Date:  04/09/2024 EXAM: CT ABDOMEN AND PELVIS WITHOUT CONTRAST 04/09/2024 09:58:16 PM TECHNIQUE: CT of the abdomen and pelvis was performed without the administration of intravenous contrast. Multiplanar reformatted images are provided for review. Automated exposure control, iterative reconstruction, and/or weight-based adjustment of the mA/kV was utilized to reduce the radiation dose to as low as reasonably achievable. COMPARISON:  03/16/2024. CLINICAL HISTORY: Abdominal pain, acute, nonlocalized. FINDINGS: LOWER CHEST: Mild patchy bilateral lower lung opacities, improved from the prior, favoring postinfectious/inflammatory scarring. LIVER: The liver is unremarkable. GALLBLADDER AND BILE DUCTS: Gallbladder is unremarkable. No biliary ductal dilatation. SPLEEN: No acute abnormality. PANCREAS: No acute abnormality. ADRENAL GLANDS: No acute abnormality. KIDNEYS, URETERS AND BLADDER: Multiple nonobstructing bilateral renal calculi measuring up to 11 mm in the right upper pole and 7 mm in the left upper pole. Bilateral renal cysts, measuring up to 3.5 cm in the left lower kidney, benign (Bosniak 1). No follow-up is recommended. Left double pigtail ureteral stent in satisfactory position. 2 right ureteral calculi along the course of the stent measuring up to 5 mm at the level of the left lower sacral ureter (image 74). 2 nonobstructing distal right ureteral calculi just above the UVJ, measuring up to 4.5 mm (image 83). Mild left hydronephrosis. Mildly thick walled, trabeculated bladder. Tiny layering right bladder calculi. Brachytherapy seeds in the prostate. No perinephric or periureteral stranding. GI AND BOWEL: Stomach demonstrates no acute abnormality. There is no bowel obstruction. PERITONEUM AND RETROPERITONEUM: No ascites. No free air. VASCULATURE: Aorta is normal in caliber. Atherosclerotic calcifications of the abdominal aorta and branch vessels. LYMPH NODES: No lymphadenopathy. REPRODUCTIVE ORGANS: Brachytherapy seeds  in the prostate. BONES AND SOFT TISSUES: Degenerative changes of the lower thoracolumbar spine. Mild superior endplate compression fracture deformity at L1, chronic. No focal soft tissue abnormality. IMPRESSION: 1. Left double pigtail ureteral stent in appropriate position. 2. Two ureteral calculi along the course of the left ureteral stent, measuring up to 5 mm in the distal left ureter, with mild left hydronephrosis. 3. Two nonobstructing distal right ureteral calculi just above the UVJ, measuring up to 4.5 mm. No right hydronephrosis. 4. Multiple nonobstructing bilateral renal calculi, measuring up to 11 mm in the right upper pole and 7 mm in the left upper pole. 5. Mildly thick-walled, trabeculated bladder with tiny layering bladder calculi. Electronically signed by: Pinkie Pebbles MD 04/09/2024 10:05 PM EST RP Workstation: [id number]     Procedures   Medications Ordered in the ED  sodium chloride  0.9 % bolus 1,000 mL (0 mLs Intravenous Stopped 04/09/24 1951)  ondansetron  (ZOFRAN ) injection 4 mg (4 mg Intravenous Given 04/09/24 1844)  sodium chloride  0.9 % bolus 1,000 mL (1,000 mLs Intravenous New Bag/Given 04/09/24 2135)                                    Medical Decision Making Amount and/or Complexity of Data Reviewed Labs: ordered. Radiology: ordered.  Risk Prescription drug management.   This patient presents to the ED for concern of n/v, this involves an extensive number of treatment options, and is a complaint that carries with it a high risk of complications and morbidity.  The differential diagnosis includes dehydration, electrolyte abn, infection   Co morbidities that complicate the patient evaluation  DM2, CKD, chronic anemia, hx of pyelo with left ureteral stone s/p stent   Additional history obtained:  Additional history obtained from epic chart review External records from outside source obtained and reviewed including EMS report   Lab Tests:  I Ordered, and  personally interpreted labs.  The pertinent results include:  cbc nl, cmp with CO2 low at 14, bun elevated at 89, cr elevated at 5.07 (bun 60 and cr 4.02 on 12/27); mg nl; lactic nl; lip sl elevated at 104   Imaging Studies ordered:  I ordered imaging studies including ct abd/pelvis  I independently visualized and interpreted imaging which showed  1. Left double pigtail ureteral stent in appropriate position.  2. Two ureteral calculi along the course of the left ureteral stent, measuring  up to 5 mm in the distal left ureter, with mild left hydronephrosis.  3. Two nonobstructing distal right ureteral calculi just above the UVJ,  measuring up to 4.5 mm. No right hydronephrosis.  4. Multiple nonobstructing bilateral renal calculi, measuring up to 11 mm in  the right upper pole and 7 mm in the left upper pole.  5. Mildly thick-walled, trabeculated bladder with tiny layering bladder calculi.   I agree with the radiologist interpretation   Cardiac Monitoring:  The patient was maintained on a cardiac monitor.  I personally viewed and interpreted the cardiac monitored which showed an underlying rhythm of: st initially, now nsr   Medicines ordered and prescription drug management:  I ordered medication including ivfs/zofran   for sx  Reevaluation of the patient after these medicines showed that the patient improved I have reviewed the patients home medicines and have made adjustments as needed   Test Considered:  ct   Critical Interventions:  ivfs  Problem List / ED Course:  Dehydration with AKI:  IVFs given.  Urine still pending at shift change.  Pt will need admission.   Reevaluation:  After the interventions noted above, I reevaluated the patient and found that they have :improved   Social Determinants of Health:  Lives at home   Dispostion:  After consideration of the diagnostic results and the patients response to treatment, I feel that the patent would benefit  from admission.       Final diagnoses:  Dehydration  Nausea and vomiting, unspecified vomiting type  Acute renal failure superimposed on stage 5 chronic kidney disease, not on chronic dialysis, unspecified acute renal failure type [facility])    ED Discharge Orders     None          Dean Clarity, MD 04/09/24 2250  "

## 2024-04-10 ENCOUNTER — Encounter (HOSPITAL_COMMUNITY): Payer: Self-pay | Admitting: Internal Medicine

## 2024-04-10 ENCOUNTER — Encounter: Payer: Self-pay | Admitting: Urology

## 2024-04-10 DIAGNOSIS — N179 Acute kidney failure, unspecified: Secondary | ICD-10-CM

## 2024-04-10 LAB — URINE CULTURE

## 2024-04-10 LAB — RENAL FUNCTION PANEL
Albumin: 3.7 g/dL (ref 3.5–5.0)
Anion gap: 21 — ABNORMAL HIGH (ref 5–15)
BUN: 81 mg/dL — ABNORMAL HIGH (ref 8–23)
CO2: 17 mmol/L — ABNORMAL LOW (ref 22–32)
Calcium: 9.8 mg/dL (ref 8.9–10.3)
Chloride: 105 mmol/L (ref 98–111)
Creatinine, Ser: 4.59 mg/dL — ABNORMAL HIGH (ref 0.61–1.24)
GFR, Estimated: 12 mL/min — ABNORMAL LOW
Glucose, Bld: 108 mg/dL — ABNORMAL HIGH (ref 70–99)
Phosphorus: 5.2 mg/dL — ABNORMAL HIGH (ref 2.5–4.6)
Potassium: 4.1 mmol/L (ref 3.5–5.1)
Sodium: 143 mmol/L (ref 135–145)

## 2024-04-10 LAB — GLUCOSE, CAPILLARY
Glucose-Capillary: 92 mg/dL (ref 70–99)
Glucose-Capillary: 97 mg/dL (ref 70–99)

## 2024-04-10 MED ORDER — MELATONIN 3 MG PO TABS: 6.0000 mg | ORAL_TABLET | Freq: Every evening | ORAL | Status: DC | PRN

## 2024-04-10 MED ORDER — POLYETHYLENE GLYCOL 3350 17 G PO PACK: 17.0000 g | PACK | Freq: Every day | ORAL | Status: DC | PRN

## 2024-04-10 MED ORDER — SODIUM CHLORIDE 0.9 % IV SOLN
2.0000 g | INTRAVENOUS | Status: DC
  Administered 2024-04-10 – 2024-04-11 (×2): 2 g via INTRAVENOUS
  Filled 2024-04-10 (×2): qty 12.5

## 2024-04-10 MED ORDER — SODIUM CHLORIDE 0.9 % IV SOLN: 2.0000 g | INTRAVENOUS | Status: DC

## 2024-04-10 MED ORDER — PANTOPRAZOLE SODIUM 20 MG PO TBEC
20.0000 mg | DELAYED_RELEASE_TABLET | Freq: Every day | ORAL | Status: DC
  Administered 2024-04-10: 20 mg via ORAL
  Filled 2024-04-10 (×3): qty 1

## 2024-04-10 MED ORDER — LACTATED RINGERS IV SOLN: INTRAVENOUS | Status: AC

## 2024-04-10 MED ORDER — PROCHLORPERAZINE EDISYLATE 10 MG/2ML IJ SOLN: 5.0000 mg | Freq: Four times a day (QID) | INTRAMUSCULAR | Status: DC | PRN

## 2024-04-10 NOTE — ED Notes (Signed)
 Report given to receiving RN.

## 2024-04-10 NOTE — Evaluation (Addendum)
 Physical Therapy Evaluation Patient Details Name: Marcus Simpson MRN: [id number] DOB: 06/18/1945 Today's Date: 04/10/2024  History of Present Illness  Marcus Simpson is a 79 y.o. male with medical history significant for prostate cancer, kidney stones, left ureteral stone status post cystoscopy with left stent placement on 03/01/2024, hypertension, type 2 diabetes, who presents with complaints of generalized weakness, nausea, and poor oral intake.  Endorses minimal urine output today.  Associated with nausea and vomiting.  No reported subjective fevers or chills.   Clinical Impression  Patient was agreeable to PT/OT co-evaluation. Patient reports at baseline, he ambulates in home with RW and is independent with ADLs. This date, patient is modified independent with bed mobility and requires min assist/CGA for transfers and limited ambulation with RW. Patient is limited due to increased fatigue and SOB. His HR reaches up to 122 bpm during ambulation trial. Pt also generally weak and unsteady throughout session. Pt tolerates sitting in chair at EOS, chair alarm set, call button in reach, and wife present.  Patient will benefit from continued skilled physical therapy acutely and in recommended venue in order to continue addressing strength, balance, and endurance deficits.        If plan is discharge home, recommend the following: A little help with walking and/or transfers;Help with stairs or ramp for entrance;Other (comment)   Can travel by private vehicle        Equipment Recommendations None recommended by PT  Recommendations for Other Services       Functional Status Assessment Patient has had a recent decline in their functional status and demonstrates the ability to make significant improvements in function in a reasonable and predictable amount of time.     Precautions / Restrictions Precautions Precautions: Fall Recall of Precautions/Restrictions: Intact Restrictions Weight  Bearing Restrictions Per Provider Order: No      Mobility  Bed Mobility Overal bed mobility: Modified Independent     General bed mobility comments: inc time secondary to labored movement, use of bed railings to assist, HOB slightly elevated    Transfers Overall transfer level: Needs assistance Equipment used: Rolling walker (2 wheels) Transfers: Sit to/from Stand, Bed to chair/wheelchair/BSC Sit to Stand: Contact guard assist, Min assist   Step pivot transfers: Contact guard assist       General transfer comment: Slight min assist with STS from bed and chair, CGA with steps from bed to chair for safety/steadying, used RW    Ambulation/Gait Ambulation/Gait assistance: Contact guard assist Gait Distance (Feet): 26 Feet Assistive device: Rolling walker (2 wheels) Gait Pattern/deviations: Step-through pattern, Decreased step length - right, Decreased step length - left, Decreased stride length, Trunk flexed Gait velocity: Decreased     General Gait Details: CGA while ambulating in room with RW, pt demo slow labored movement throughout, required a seated rest break after transfer and ambulating with forward and backward steps at bedside about 3 feet before completing remainder of gait distance, appeared fatigued at end, HR elevated  Stairs            Wheelchair Mobility     Tilt Bed    Modified Rankin (Stroke Patients Only)       Balance Overall balance assessment: Needs assistance Sitting-balance support: No upper extremity supported, Feet supported Sitting balance-Leahy Scale: Good Sitting balance - Comments: seated at EOB   Standing balance support: Bilateral upper extremity supported, During functional activity, Reliant on assistive device for balance Standing balance-Leahy Scale: Fair Standing balance comment: using RW  Pertinent Vitals/Pain Pain Assessment Pain Assessment: No/denies pain    Home Living Family/patient expects to be  discharged to:: Private residence Living Arrangements: Spouse/significant other Available Help at Discharge: Family;Available 24 hours/day Type of Home: House Home Access: Stairs to enter   [employer] of Steps: 1   Home Layout: One level Home Equipment: [employer] (2 wheels);Cane - single point;Shower seat;Rollator (4 wheels)      Prior Function Prior Level of Function : Needs assist       Physical Assist : ADLs (physical);Mobility (physical) Mobility (physical): Transfers;Gait ADLs (physical): IADLs Mobility Comments: pt reports as household ambulation with RW, no falls ADLs Comments: Independent ADL's; wife handles IADL's.     Extremity/Trunk Assessment   Upper Extremity Assessment Upper Extremity Assessment: Defer to OT evaluation    Lower Extremity Assessment Lower Extremity Assessment: Generalized weakness (all RLE MMT grossly 4-/5, LLE MMT grossly 4/5)    Cervical / Trunk Assessment Cervical / Trunk Assessment: Kyphotic  Communication   Communication Communication: No apparent difficulties    Cognition Arousal: Alert Behavior During Therapy: WFL for tasks assessed/performed       Following commands: Intact       Cueing Cueing Techniques: Verbal cues, Visual cues     General Comments      Exercises     Assessment/Plan    PT Assessment Patient needs continued PT services;All further PT needs can be met in the next venue of care  PT Problem List Decreased strength;Decreased activity tolerance;Decreased balance       PT Treatment Interventions DME instruction;Balance training;Gait training;Functional mobility training;Therapeutic activities;Therapeutic exercise;Patient/family education    PT Goals (Current goals can be found in the Care Plan section)  Acute Rehab PT Goals Patient Stated Goal: Return home and continue HHPT PT Goal Formulation: With patient Time For Goal Achievement: 04/13/24 Potential to Achieve Goals: Good     Frequency Min 3X/week     Co-evaluation PT/OT/SLP Co-Evaluation/Treatment: Yes Reason for Co-Treatment: To address functional/ADL transfers PT goals addressed during session: Mobility/safety with mobility OT goals addressed during session: ADL's and self-care       AM-PAC PT 6 Clicks Mobility  Outcome Measure Help needed turning from your back to your side while in a flat bed without using bedrails?: None Help needed moving from lying on your back to sitting on the side of a flat bed without using bedrails?: A Little Help needed moving to and from a bed to a chair (including a wheelchair)?: A Little Help needed standing up from a chair using your arms (e.g., wheelchair or bedside chair)?: A Little Help needed to walk in hospital room?: A Little Help needed climbing 3-5 steps with a railing? : A Lot 6 Click Score: 18    End of Session Equipment Utilized During Treatment: Gait belt Activity Tolerance: Patient tolerated treatment well;Patient limited by fatigue Patient left: with family/visitor present;in chair;with call bell/phone within reach;with chair alarm set Nurse Communication: Mobility status PT Visit Diagnosis: Unsteadiness on feet (R26.81);Other abnormalities of gait and mobility (R26.89);Muscle weakness (generalized) (M62.81)    Time: 9046-8989 PT Time Calculation (min) (ACUTE ONLY): 17 min   Charges:   PT Evaluation $PT Eval Low Complexity: 1 Low   PT General Charges $$ ACUTE PT VISIT: 1 Visit        12:12 PM, 04/10/24 Daleyssa Loiselle Powell-Butler, PT, DPT [place] with [facility]

## 2024-04-10 NOTE — TOC Initial Note (Signed)
 Transition of Care [facility]) - Initial/Assessment Note    Patient Details  Name: Marcus Simpson MRN: [id number] Date of Birth: 08/18/1945  Transition of Care [facility]) CM/SW Contact:    Noreen KATHEE Pinal, LCSWA Phone Number: 04/10/2024, 2:25 PM  Clinical Narrative:                  Patient is high risk for readmission and was admitted for AKI (acute kidney injury) . CSW spoke with patient at bedside and completed assessment and discuss patient recommendation for HHPT. Patient reports that he is independent at home and that he has necessary equipment. Patient is agreeable to HHPT and reports that he is active with an agency, but did not remember the name. Patient gave CSW permission to call his spouse. CSW called spouse to ask about agency and she said it was [facility] . Referral sent through HUB. Spouse also had medically questions and CSW asked MD to give her a call. ICM will continue to follow.   Expected Discharge Plan: Home w Home Health Services Barriers to Discharge: Continued Medical Work up   Patient Goals and CMS Choice Patient states their goals for this hospitalization and ongoing recovery are:: get well and DC home CMS Medicare.gov Compare Post Acute Care list provided to:: Patient Choice offered to / list presented to : Adult Children, Spouse      Expected Discharge Plan and Services     Post Acute Care Choice: Durable Medical Equipment, Home Health Living arrangements for the past 2 months: Single Family Home                           HH Arranged: PT HH Agency: [facility] Date  [facility] Agency Contacted: 04/10/24 Time HH Agency Contacted: 1424 Representative spoke with at [facility] Agency: Spouse and patient reports currrently being active - referral sent via HUB  Prior Living Arrangements/Services Living arrangements for the past 2 months: Single Family Home Lives with:: Self Patient language and need for interpreter reviewed:: Yes Do you feel safe going back to the  place where you live?: Yes      Need for Family Participation in Patient Care: Yes (Comment) Care giver support system in place?: No (comment) Current home services: DME Criminal Activity/Legal Involvement Pertinent to Current Situation/Hospitalization: No - Comment as needed  Activities of Daily Living   ADL Screening (condition at time of admission) Independently performs ADLs?: Yes (appropriate for developmental age) Is the patient deaf or have difficulty hearing?: No Does the patient have difficulty seeing, even when wearing glasses/contacts?: No Does the patient have difficulty concentrating, remembering, or making decisions?: No  Permission Sought/Granted   Permission granted to share information with : Yes, Verbal Permission Granted  Share Information with NAME: Ritvik and Vina     Permission granted to share info w Relationship: Patient and spouse     Emotional Assessment Appearance:: Appears stated age Attitude/Demeanor/Rapport: Apprehensive, Engaged Affect (typically observed): Accepting Orientation: : Oriented to Self, Oriented to Place, Oriented to  Time, Oriented to Situation Alcohol / Substance Use: Not Applicable Psych Involvement: No (comment)  Admission diagnosis:  Dehydration [E86.0] AKI (acute kidney injury) [N17.9] Acute renal failure superimposed on stage 5 chronic kidney disease, not on chronic dialysis, unspecified acute renal failure type (HCC) [N17.9, N18.5] Nausea and vomiting, unspecified vomiting type [R11.2] Patient Active Problem List   Diagnosis Date Noted   Acute renal failure superimposed on stage 3b chronic kidney disease (HCC) 03/16/2024  Lobar pneumonia 03/16/2024   Acute renal failure superimposed on stage 4 chronic kidney disease (HCC) 03/15/2024   Aspiration pneumonitis (HCC) 03/14/2024   AKI (acute kidney injury) 03/07/2024   Generalized weakness 03/07/2024   Microcytic anemia 03/07/2024   Pneumonia 03/07/2024   Metabolic acidosis  03/07/2024   Bilateral nephrolithiasis 03/07/2024   Bacteremia due to Pseudomonas 02/21/2024   Septic shock (HCC) 02/18/2024   Acute gout 02/11/2024   Effusion of right knee joint 02/10/2024   Bacteremia due to methicillin resistant Staphylococcus epidermidis 02/08/2024   Acute pyelonephritis 02/08/2024   Left ureteral calculus 02/08/2024   Anemia of chronic disease 02/08/2024   Well controlled type 2 diabetes mellitus (HCC) 02/08/2024   Bilateral lower extremity edema 02/08/2024   Severe sepsis (HCC) 02/08/2024   Acute kidney injury due to urinary tract obstruction 02/05/2024   Sepsis secondary to UTI (HCC) 12/29/2022   Acute encephalopathy 12/29/2022   Hydronephrosis with obstructing calculus 12/29/2022   Ureteral stone with hydronephrosis 12/28/2022   OA (osteoarthritis) of knee 05/02/2017   Diarrhea 03/10/2015   Prostate cancer (HCC) 03/10/2015   Hyperlipidemia 11/13/2008   Essential hypertension 11/13/2008   SYNCOPE AND COLLAPSE 11/13/2008   PCP:  [name], MD Pharmacy:   [facility] - [place], [place] - [address] [place] [address] Phone: [phone] Fax: [phone]     Social Drivers of Health (SDOH) Social History: SDOH Screenings   Food Insecurity: No Food Insecurity (04/10/2024)  Housing: Low Risk (04/10/2024)  Transportation Needs: No Transportation Needs (04/10/2024)  Utilities: Not At Risk (04/10/2024)  Alcohol Screen: Low Risk (10/11/2023)  Depression (PHQ2-9): Low Risk (01/19/2024)  Social Connections: Moderately Integrated (04/10/2024)  Recent Concern: Social Connections - Moderately Isolated (02/05/2024)  Tobacco Use: Medium Risk (04/10/2024)   SDOH Interventions:     Readmission Risk Interventions    04/10/2024    2:22 PM 03/08/2024    1:38 PM 02/23/2024    3:25 PM  Readmission Risk Prevention Plan  Transportation Screening Complete Complete Complete  PCP or Specialist Appt within 3-5 Days   Complete  HRI or Home  Care Consult  Complete Complete  Social Work Consult for Recovery Care Planning/Counseling  Complete Complete  Palliative Care Screening  Not Applicable Not Applicable  Medication Review Oceanographer) Complete  Complete  HRI or Home Care Consult Complete    SW Recovery Care/Counseling Consult Complete    Palliative Care Screening Not Applicable    Skilled Nursing Facility Complete

## 2024-04-10 NOTE — Evaluation (Signed)
 Occupational Therapy Evaluation Patient Details Name: ANDREN BETHEA MRN: [id number] DOB: 25-Jul-1945 Today's Date: 04/10/2024   History of Present Illness   Remer Couse Zanders is a 79 y.o. male with medical history significant for prostate cancer, kidney stones, left ureteral stone status post cystoscopy with left stent placement on 03/01/2024, hypertension, type 2 diabetes, who presents with complaints of generalized weakness, nausea, and poor oral intake.  Endorses minimal urine output today.  Associated with nausea and vomiting.  No reported subjective fevers or chills. (per DO)     Clinical Impressions Pt agreeable to OT and PT co-evaluation. Pt reports use of RW at baseline with wife assisting for IADL's. Today pt required CGA to min A for functional transfers with RW. Mod I level of assist needed for bed mobility today. B UE generally weak with pt at level of set up assist for seated ADL tasks and set up to min A for standing ADL tasks. Pt left in the chair with call bell within reach, chair alarm set, and family present. Pt will benefit from continued OT in the hospital to increase strength, balance, and endurance for safe ADL's.        If plan is discharge home, recommend the following:   A little help with walking and/or transfers;A little help with bathing/dressing/bathroom;Assistance with cooking/housework;Assist for transportation;Help with stairs or ramp for entrance     Functional Status Assessment   Patient has had a recent decline in their functional status and demonstrates the ability to make significant improvements in function in a reasonable and predictable amount of time.     Equipment Recommendations   None recommended by OT             Precautions/Restrictions   Precautions Precautions: Fall Recall of Precautions/Restrictions: Intact Restrictions Weight Bearing Restrictions Per Provider Order: No     Mobility Bed Mobility Overal bed  mobility: Modified Independent             General bed mobility comments: Use of bed rail with HOB slightly elevated.    Transfers Overall transfer level: Needs assistance Equipment used: Rolling walker (2 wheels) Transfers: Sit to/from Stand, Bed to chair/wheelchair/BSC Sit to Stand: Contact guard assist, Min assist     Step pivot transfers: Contact guard assist     General transfer comment: Mild labored effort for sit to stand. More CGA to take steps to chair with RW.      Balance Overall balance assessment: Needs assistance Sitting-balance support: No upper extremity supported, Feet supported Sitting balance-Leahy Scale: Good Sitting balance - Comments: seated at EOB   Standing balance support: Bilateral upper extremity supported, During functional activity, Reliant on assistive device for balance Standing balance-Leahy Scale: Fair Standing balance comment: using RW                           ADL either performed or assessed with clinical judgement   ADL Overall ADL's : Needs assistance/impaired     Grooming: Contact guard assist;Minimal assistance;Standing   Upper Body Bathing: Set up;Sitting   Lower Body Bathing: Set up;Sitting/lateral leans   Upper Body Dressing : Set up;Sitting   Lower Body Dressing: Set up;Sitting/lateral leans;Minimal assistance;Sit to/from stand   Toilet Transfer: Contact guard assist;Minimal assistance;Rolling walker (2 wheels);Ambulation Toilet Transfer Details (indicate cue type and reason): Simulated via ambulation in the room. Toileting- Clothing Manipulation and Hygiene: Minimal assistance;Set up;Sitting/lateral lean;Sit to/from stand       Functional mobility  during ADLs: Contact guard assist;Rolling walker (2 wheels) General ADL Comments: Able to ambulate a short distance in the room with RW.     Vision Baseline Vision/History: 1 Wears glasses Ability to See in Adequate Light: 0 Adequate Patient Visual Report:  No change from baseline Vision Assessment?: No apparent visual deficits     Perception Perception: Not tested       Praxis Praxis: Not tested       Pertinent Vitals/Pain Pain Assessment Pain Assessment: No/denies pain     Extremity/Trunk Assessment Upper Extremity Assessment Upper Extremity Assessment: Generalized weakness   Lower Extremity Assessment Lower Extremity Assessment: Defer to PT evaluation   Cervical / Trunk Assessment Cervical / Trunk Assessment: Kyphotic   Communication Communication Communication: No apparent difficulties   Cognition Arousal: Alert Behavior During Therapy: WFL for tasks assessed/performed Cognition: No apparent impairments                               Following commands: Intact       Cueing  General Comments   Cueing Techniques: Verbal cues                 Home Living Family/patient expects to be discharged to:: Private residence Living Arrangements: Spouse/significant other Available Help at Discharge: Family;Available 24 hours/day Type of Home: House Home Access: Stairs to enter [employer] of Steps: 1   Home Layout: One level     Bathroom Shower/Tub: Producer, Television/film/video: Handicapped height Bathroom Accessibility: Yes   Home Equipment: Agricultural Consultant (2 wheels);Cane - single point;Shower seat;Rollator (4 wheels)          Prior Functioning/Environment Prior Level of Function : Needs assist       Physical Assist : ADLs (physical);Mobility (physical) Mobility (physical): Transfers;Gait ADLs (physical): IADLs Mobility Comments: Mostly household ambulation with RW. ADLs Comments: Independent ADL's; wife does much of the IADL's.    OT Problem List: Decreased strength;Decreased activity tolerance;Impaired balance (sitting and/or standing)   OT Treatment/Interventions: Self-care/ADL training;Therapeutic exercise;Therapeutic activities;Patient/family education;Balance  training      OT Goals(Current goals can be found in the care plan section)   Acute Rehab OT Goals Patient Stated Goal: Return home. OT Goal Formulation: With patient Time For Goal Achievement: 04/24/24 Potential to Achieve Goals: Good   OT Frequency:  Min 1X/week    Co-evaluation PT/OT/SLP Co-Evaluation/Treatment: Yes Reason for Co-Treatment: To address functional/ADL transfers   OT goals addressed during session: ADL's and self-care                       End of Session Equipment Utilized During Treatment: Rolling walker (2 wheels);Gait belt  Activity Tolerance: Patient tolerated treatment well Patient left: in chair;with call bell/phone within reach;with chair alarm set;with family/visitor present  OT Visit Diagnosis: Unsteadiness on feet (R26.81);Other abnormalities of gait and mobility (R26.89);Muscle weakness (generalized) (M62.81)                Time: 9047-8988 OT Time Calculation (min): 19 min Charges:  OT General Charges $OT Visit: 1 Visit OT Evaluation $OT Eval Low Complexity: 1 Low  Krishana Lutze OT, MOT  Jayson Person 04/10/2024, 10:54 AM

## 2024-04-10 NOTE — Plan of Care (Signed)
" °  Problem: Acute Rehab OT Goals (only OT should resolve) Goal: Pt. Will Perform Grooming Flowsheets (Taken 04/10/2024 1057) Pt Will Perform Grooming:  with modified independence  standing Goal: Pt. Will Perform Lower Body Dressing Flowsheets (Taken 04/10/2024 1057) Pt Will Perform Lower Body Dressing: with modified independence Goal: Pt. Will Transfer To Toilet Flowsheets (Taken 04/10/2024 1057) Pt Will Transfer to Toilet:  with modified independence  ambulating Goal: Pt. Will Perform Toileting-Clothing Manipulation Flowsheets (Taken 04/10/2024 1057) Pt Will Perform Toileting - Clothing Manipulation and hygiene: with modified independence Goal: Pt/Caregiver Will Perform Home Exercise Program Flowsheets (Taken 04/10/2024 1057) Pt/caregiver will Perform Home Exercise Program:  Increased strength  Both right and left upper extremity  Independently  Asherah Lavoy OT, MOT  "

## 2024-04-10 NOTE — Progress Notes (Signed)
 " PROGRESS NOTE    Marcus Simpson  FMW:[id number] DOB: 1946/03/15 DOA: 04/09/2024 PCP: [name], MD   Brief Narrative:   Marcus Simpson is a 79 y.o. male with medical history significant for prostate cancer, kidney stones, left ureteral stone status post cystoscopy with left stent placement on 03/01/2024, hypertension, type 2 diabetes, who presents with complaints of generalized weakness, nausea, and poor oral intake.  Endorses minimal urine output today.  Associated with nausea and vomiting.  No reported subjective fevers or chills.   In the ER, tachypneic.  UA positive for pyuria.  Lab studies notable for BUN 89, creatinine 5.07 from 4.02 almost a month ago.  Serum bicarb 14, anion gap 23.  Lactic acid 2.2, 1.9.   The patient received 2 L NS IV fluid boluses, IV Zofran  4 mg x 1, and 1 dose of Rocephin  1 g x 1.  TRH, hospitalist service, was asked to admit for AKI on CKD 5 and complicated UTI.   ED Course: Temperature 98.  BP 123/69, pulse 77, respiratory rate 23, O2 saturation 100% on room air.     Assessment & Plan:   Principal Problem:   AKI (acute kidney injury)  AKI on CKD 5 prerenal, secondary to dehydration from poor oral intake Baseline creatinine appears to be 4.0 with GFR of 14. Presented with creatinine of 5.07--> improved to 4.59 Continue IV fluid hydration LR at 75 cc/h  Monitor urine output Avoid nephrotoxic agents, dehydration, and hypotension. CT showed no obstruction   High anion gap metabolic acidosis in the setting of acute renal insufficiency Serum bicarb 14, anion gap 23 Continue LR IV fluid hydration Repeat BMP in the morning.   Left ureteral stone status post stent placement Followed by urology outpatient Stent (03/01/2024) is still in place.  Multiple stones with no obstruction per CT. As needed pain control    Presumed complicated UTI, POA UA positive for pyuria Will switch antibiotics to cefepime  based on previous cultures.  Will follow  cultures   Hypertension Soft BP on admission, currently stable.  Generalized weakness PT OT evaluation Fall precautions.   Prediabetes Last hemoglobin A1c 6.4 on 12/23/2023 Heart healthy carb modified diet Continue to monitor CBGs twice daily   History of prostate cancer Follows with urology outpatient.       DVT prophylaxis: Subcu heparin  3 times daily.   Code Status: Full code.   Family Communication: None at bedside.   Disposition Plan: Admitted to telemetry unit.   Consults called: None.   Admission status: Inpatient status.     Status is: Inpatient The patient requires at least 2 midnights for further evaluation and treatment of present condition.    Subjective:  Patient sitting in chair not in any acute distress.  Denies nausea vomiting fever abdominal pain chills.  Vital signs are stable.  Objective: Vitals:   04/10/24 0015 04/10/24 0030 04/10/24 0129 04/10/24 0420  BP: 133/70 101/87 (!) 146/79 123/69  Pulse: 75 85 88 77  Resp: 20 (!) 23 20   Temp:   97.6 F (36.4 C) 98 F (36.7 C)  TempSrc:   Oral Oral  SpO2: 96% 96% 99% 99%  Weight:   85 kg   Height:        Intake/Output Summary (Last 24 hours) at 04/10/2024 0756 Last data filed at 04/10/2024 0617 Gross per 24 hour  Intake 446.25 ml  Output --  Net 446.25 ml   Filed Weights   04/09/24 1814 04/10/24 0129  Weight: 80.6 kg 85 kg    Examination:  General exam: Chronically ill-appearing, appears calm and comfortable  Respiratory system: Bilateral decreased breath sounds at bases Cardiovascular system: S1 & S2 heard, Rate controlled Gastrointestinal system: Abdomen is nondistended, soft and nontender. Normal bowel sounds heard. Extremities: No cyanosis, clubbing, edema  Central nervous system: Alert and oriented. No focal neurological deficits. Moving extremities  Data Reviewed: I have personally reviewed following labs and imaging studies  CBC: Recent Labs  Lab 04/09/24 1829  WBC 10.4   NEUTROABS 7.2  HGB 13.3  HCT 43.2  MCV 92.1  PLT 264   Basic Metabolic Panel: Recent Labs  Lab 04/09/24 2005  NA 139  K 4.5  CL 102  CO2 14*  GLUCOSE 120*  BUN 89*  CREATININE 5.07*  CALCIUM  9.8  MG 2.1   GFR: Estimated Creatinine Clearance: 12.4 mL/min (A) (by C-G formula based on SCr of 5.07 mg/dL (H)). Liver Function Tests: Recent Labs  Lab 04/09/24 2005  AST 20  ALT 8  ALKPHOS 107  BILITOT 0.7  PROT 8.3*  ALBUMIN 3.9   Recent Labs  Lab 04/09/24 2005  LIPASE 104*   No results for input(s): AMMONIA in the last 168 hours. Coagulation Profile: No results for input(s): INR, PROTIME in the last 168 hours. Cardiac Enzymes: No results for input(s): CKTOTAL, CKMB, CKMBINDEX, TROPONINI in the last 168 hours. BNP (last 3 results) Recent Labs    03/07/24 1216  PROBNP 2,835.0*   HbA1C: No results for input(s): HGBA1C in the last 72 hours. CBG: Recent Labs  Lab 04/10/24 0723  GLUCAP 92   Lipid Profile: No results for input(s): CHOL, HDL, LDLCALC, TRIG, CHOLHDL, LDLDIRECT in the last 72 hours. Thyroid  Function Tests: No results for input(s): TSH, T4TOTAL, FREET4, T3FREE, THYROIDAB in the last 72 hours. Anemia Panel: No results for input(s): VITAMINB12, FOLATE, FERRITIN, TIBC, IRON, RETICCTPCT in the last 72 hours. Sepsis Labs: Recent Labs  Lab 04/09/24 1839 04/09/24 2005  LATICACIDVEN 2.2* 1.9    Recent Results (from the past 240 hours)  Microscopic Examination     Status: Abnormal   Collection Time: 04/06/24  9:03 AM   Urine  Result Value Ref Range Status   WBC, UA >30 (A) 0 - 5 /hpf Final   RBC, Urine >30 (A) 0 - 2 /hpf Final   Epithelial Cells (non renal) 0-10 0 - 10 /hpf Final   Bacteria, UA Many (A) None seen/Few Final         Radiology Studies: CT ABDOMEN PELVIS WO CONTRAST Result Date: 04/09/2024 EXAM: CT ABDOMEN AND PELVIS WITHOUT CONTRAST 04/09/2024 09:58:16 PM TECHNIQUE: CT of  the abdomen and pelvis was performed without the administration of intravenous contrast. Multiplanar reformatted images are provided for review. Automated exposure control, iterative reconstruction, and/or weight-based adjustment of the mA/kV was utilized to reduce the radiation dose to as low as reasonably achievable. COMPARISON: 03/16/2024. CLINICAL HISTORY: Abdominal pain, acute, nonlocalized. FINDINGS: LOWER CHEST: Mild patchy bilateral lower lung opacities, improved from the prior, favoring postinfectious/inflammatory scarring. LIVER: The liver is unremarkable. GALLBLADDER AND BILE DUCTS: Gallbladder is unremarkable. No biliary ductal dilatation. SPLEEN: No acute abnormality. PANCREAS: No acute abnormality. ADRENAL GLANDS: No acute abnormality. KIDNEYS, URETERS AND BLADDER: Multiple nonobstructing bilateral renal calculi measuring up to 11 mm in the right upper pole and 7 mm in the left upper pole. Bilateral renal cysts, measuring up to 3.5 cm in the left lower kidney, benign (Bosniak 1). No follow-up is recommended. Left double pigtail  ureteral stent in satisfactory position. 2 right ureteral calculi along the course of the stent measuring up to 5 mm at the level of the left lower sacral ureter (image 74). 2 nonobstructing distal right ureteral calculi just above the UVJ, measuring up to 4.5 mm (image 83). Mild left hydronephrosis. Mildly thick walled, trabeculated bladder. Tiny layering right bladder calculi. Brachytherapy seeds in the prostate. No perinephric or periureteral stranding. GI AND BOWEL: Stomach demonstrates no acute abnormality. There is no bowel obstruction. PERITONEUM AND RETROPERITONEUM: No ascites. No free air. VASCULATURE: Aorta is normal in caliber. Atherosclerotic calcifications of the abdominal aorta and branch vessels. LYMPH NODES: No lymphadenopathy. REPRODUCTIVE ORGANS: Brachytherapy seeds in the prostate. BONES AND SOFT TISSUES: Degenerative changes of the lower thoracolumbar spine.  Mild superior endplate compression fracture deformity at L1, chronic. No focal soft tissue abnormality. IMPRESSION: 1. Left double pigtail ureteral stent in appropriate position. 2. Two ureteral calculi along the course of the left ureteral stent, measuring up to 5 mm in the distal left ureter, with mild left hydronephrosis. 3. Two nonobstructing distal right ureteral calculi just above the UVJ, measuring up to 4.5 mm. No right hydronephrosis. 4. Multiple nonobstructing bilateral renal calculi, measuring up to 11 mm in the right upper pole and 7 mm in the left upper pole. 5. Mildly thick-walled, trabeculated bladder with tiny layering bladder calculi. Electronically signed by: Pinkie Pebbles MD 04/09/2024 10:05 PM EST RP Workstation: [id number]        Scheduled Meds:  heparin   5,000 Units Subcutaneous Q8H   Continuous Infusions:  cefTRIAXone  (ROCEPHIN )  IV     lactated ringers  75 mL/hr at 04/10/24 9382          Derryl Duval, MD [facility] 04/10/2024, 7:56 AM   "

## 2024-04-10 NOTE — ED Notes (Signed)
 Transported to Rm 203.

## 2024-04-10 NOTE — Patient Instructions (Signed)
 Urinary Tract Infection, Male A urinary tract infection (UTI) is an infection in your urinary tract. The urinary tract is made up of the organs that make, store, and get rid of pee (urine) in your body. These organs include: The kidneys. The ureters. The bladder. The urethra. What are the causes? Most UTIs are caused by germs called bacteria. They may be in or near your genitals. These germs grow and cause swelling in your urinary tract. What increases the risk? You're more likely to get a UTI if: You have a soft tube called a catheter that drains your pee. You can't control when you pee or poop. You have trouble peeing because of: A prostate that's bigger than normal. A kidney stone. A urinary blockage. A nerve condition that affects your bladder. Not getting enough to drink. You're sexually active. You're an older adult. You're also more likely to get a UTI if you have other health problems, such as: Diabetes. A weak immune system. Your immune system is your body's defense system. Sickle cell disease. Injury of the spine. What are the signs or symptoms? Symptoms may include: Needing to pee right away. Peeing small amounts often. Trouble getting the stream started. Pain or burning when you pee. Blood in your pee. Pee that smells bad or odd. Pain in your belly or lower back. You may also: Feel confused. This may be the first symptom in older adults. Vomit. Not feel hungry. Feel tired or easily annoyed. Have a fever or chills. How is this diagnosed? A UTI is diagnosed based on your medical history and an exam. You may also have other tests. These may include: Pee tests. Blood tests. Tests for sexually transmitted infections (STIs). If you've had more than one UTI, you may need to have imaging studies done to find out why you keep getting them. How is this treated? A UTI can be treated by: Taking antibiotics or other medicines. Drinking enough fluid to keep your pee  pale yellow. In rare cases, a UTI can cause a very bad condition called sepsis. Sepsis may be treated in the hospital. Follow these instructions at home: Medicines Take your medicines only as told by your health care provider. If you were given antibiotics, take them as told by your provider. Do not stop taking them even if you start to feel better. General instructions Make sure you: Pee often and fully. Do not hold your pee for a long time. Pee after you have sex. Contact a health care provider if: Your symptoms don't get better after 1-2 days of taking antibiotics. Your symptoms go away and then come back. You have a fever or chills. You vomit or feel like you may vomit. Get help right away if: You have very bad pain in your back or lower belly. You faint. This information is not intended to replace advice given to you by your health care provider. Make sure you discuss any questions you have with your health care provider. Document Revised: 02/16/2023 Document Reviewed: 06/11/2022 Elsevier Patient Education  2025 [employer].

## 2024-04-10 NOTE — Plan of Care (Signed)
" °  Problem: Acute Rehab PT Goals(only PT should resolve) Goal: Pt Will Go Supine/Side To Sit Outcome: Progressing Flowsheets (Taken 04/10/2024 1215) Pt will go Supine/Side to Sit: Independently Goal: Patient Will Transfer Sit To/From Stand Outcome: Progressing Flowsheets (Taken 04/10/2024 1215) Patient will transfer sit to/from stand: with supervision Goal: Pt Will Transfer Bed To Chair/Chair To Bed Outcome: Progressing Flowsheets (Taken 04/10/2024 1215) Pt will Transfer Bed to Chair/Chair to Bed: with supervision Goal: Pt Will Ambulate Outcome: Progressing Flowsheets (Taken 04/10/2024 1215) Pt will Ambulate:  50 feet  with rolling walker  with supervision    12:16 PM, 04/10/24 Rosaria Settler, PT, DPT [place] with [facility]  "

## 2024-04-10 NOTE — H&P (Addendum)
 " History and Physical  Marcus Simpson FMW:[id number] DOB: 18-Aug-1945 DOA: 04/09/2024  Referring physician: Dr. Geroldine, EDP  PCP: [name], MD  Outpatient Specialists: Urology. Patient coming from: Home.  Chief Complaint: Generalized weakness, nausea, poor oral intake.  HPI: Marcus Simpson is a 79 y.o. male with medical history significant for prostate cancer, kidney stones, left ureteral stone status post cystoscopy with left stent placement on 03/01/2024, hypertension, type 2 diabetes, who presents with complaints of generalized weakness, nausea, and poor oral intake.  Endorses minimal urine output today.  Associated with nausea and vomiting.  No reported subjective fevers or chills.  In the ER, tachypneic.  UA positive for pyuria.  Lab studies notable for BUN 89, creatinine 5.07 from 4.02 almost a month ago.  Serum bicarb 14, anion gap 23.  Lactic acid 22, 1.9.  The patient received 2 L NS IV fluid boluses, IV Zofran  4 mg x 1, and 1 dose of Rocephin  1 g x 1.  TRH, hospitalist service, was asked to admit for AKI on CKD 5 and complicated UTI.  ED Course: Temperature 98.  BP 123/69, pulse 77, respiratory rate 23, O2 saturation 100% on room air.  Review of Systems: Review of systems as noted in the HPI. All other systems reviewed and are negative.   Past Medical History:  Diagnosis Date   Arthritis    Bradycardia    asymptomatic    Diabetes mellitus without complication (HCC)    History of kidney stones    Hypertension    Prostate cancer [facility])    Past Surgical History:  Procedure Laterality Date   COLONOSCOPY     CYSTOSCOPY WITH STENT PLACEMENT Right 12/28/2022   Procedure: CYSTOSCOPY WITH STENT PLACEMENT;  Surgeon: Carolee Sherwood BIRCH DOUGLAS, MD;  Location[facility];  Service: Urology;  Laterality: Right;   CYSTOSCOPY WITH STENT PLACEMENT Left 02/05/2024   Procedure: CYSTOSCOPY, WITH STENT INSERTION. URETHRAL DILATION;  Surgeon: Shane Steffan BROCKS, MD;  Location: [facility] ORS;  Service:  Urology;  Laterality: Left;   CYSTOSCOPY/RETROGRADE/URETEROSCOPY/STONE EXTRACTION WITH BASKET Left 03/01/2024   Procedure: CYSTOSCOPY, WITH CALCULUS REMOVAL USING BASKET;  Surgeon: Sherrilee Belvie CROME, MD;  Location: [facility] ORS;  Service: Urology;  Laterality: Left;   CYSTOSCOPY/URETEROSCOPY/HOLMIUM LASER/STENT PLACEMENT Left 03/01/2024   Procedure: CYSTOSCOPY/URETEROSCOPY/HOLMIUM LASER/STENT PLACEMENT;  Surgeon: Sherrilee Belvie CROME, MD;  Location: [facility] ORS;  Service: Urology;  Laterality: Left;   EXTRACORPOREAL SHOCK WAVE LITHOTRIPSY Right 01/25/2023   Procedure: EXTRACORPOREAL SHOCK WAVE LITHOTRIPSY (ESWL);  Surgeon: [name], MD;  Location: [facility] ORS;  Service: Urology;  Laterality: Right;   FLEXIBLE SIGMOIDOSCOPY N/A 04/02/2015   Procedure: FLEXIBLE SIGMOIDOSCOPY;  Surgeon: Claudis RAYMOND Rivet, MD;  Location: [facility] ENDO SUITE;  Service: Endoscopy;  Laterality: N/A;  1:45 - moved to 1/11 @ 8:30 - Ann to notify   HOLMIUM LASER APPLICATION Left 03/01/2024   Procedure: HOLMIUM LASER APPLICATION;  Surgeon: Sherrilee Belvie CROME, MD;  Location: [facility] ORS;  Service: Urology;  Laterality: Left;   KIDNEY STONE SURGERY     RADIOACTIVE SEED IMPLANT N/A 12/29/2023   Procedure: INSERTION, RADIATION SOURCE, PROSTATE;  Surgeon: Sherrilee Belvie CROME, MD;  Location: [facility] ORS;  Service: Urology;  Laterality: N/A;   SPACE OAR INSTILLATION N/A 12/29/2023   Procedure: INJECTION, HYDROGEL SPACER;  Surgeon: Sherrilee Belvie CROME, MD;  Location: [facility] ORS;  Service: Urology;  Laterality: N/A;   TOTAL KNEE ARTHROPLASTY Left 05/02/2017   Procedure: LEFT TOTAL KNEE ARTHROPLASTY;  Surgeon: Melodi Lerner, MD;  Location: [facility] ORS;  Service: Orthopedics;  Laterality: Left;    Social History:  reports that he has quit smoking. His smoking use included cigarettes. He has never used smokeless tobacco. He reports that he does not drink alcohol and does not use drugs.   Allergies[1]  Family history: None reported.  Prior to Admission  medications  Medication Sig Start Date End Date Taking? Authorizing Provider  aspirin  EC 81 MG tablet Take 81 mg by mouth daily.    [provider]  Cholecalciferol  (VITAMIN D -3 PO) Take 1 capsule by mouth daily.    [provider]  clotrimazole -betamethasone  (LOTRISONE ) cream Apply 1 Application topically 2 (two) times daily. 04/06/24   McKenzie, Belvie CROME, MD  Cyanocobalamin  (VITAMIN B-12) 2500 MCG SUBL Place 2,500 mcg under the tongue daily.    [provider]  doxycycline  (VIBRAMYCIN ) 100 MG capsule Take 1 capsule (100 mg total) by mouth every 12 (twelve) hours. 04/06/24   McKenzie, Belvie CROME, MD  folic acid  (FOLVITE ) 1 MG tablet Take 1 tablet (1 mg total) by mouth daily. 03/18/24   Evonnie Lenis, MD  glipiZIDE (GLUCOTROL XL) 2.5 MG 24 hr tablet Take 2.5 mg by mouth daily. 11/26/22   [provider]  Glucose Blood (BLOOD GLUCOSE TEST STRIPS) STRP 1 each by Does not apply route as directed. Dispense based on patient and insurance preference. Use up to four times daily as directed. (FOR ICD-10 E10.9, E11.9). 02/13/24   Sebastian Toribio GAILS, MD  Lancets MISC 1 each by Does not apply route as directed. Dispense based on patient and insurance preference. Use up to four times daily as directed. (FOR ICD-10 E10.9, E11.9). 02/13/24   Sebastian Toribio GAILS, MD  meclizine  (ANTIVERT ) 25 MG tablet Take 25 mg by mouth 2 (two) times daily.    [provider]  metoprolol  succinate (TOPROL -XL) 25 MG 24 hr tablet Take 1 tablet (25 mg total) by mouth daily. 03/17/24   Evonnie Lenis, MD  [facility] ULTRA TEST test strip 1 each by Other route daily. 02/13/24   Sebastian Toribio GAILS, MD  rosuvastatin  (CRESTOR ) 5 MG tablet Take 5 mg by mouth every Saturday.    [provider]  tamsulosin  (FLOMAX ) 0.4 MG CAPS capsule Take 1 capsule (0.4 mg total) by mouth daily. Patient not taking: Reported on 03/08/2024 02/03/24   Sherrilee Belvie CROME, MD    Physical Exam: BP 136/78   Pulse 83    Temp 97.6 F (36.4 C) (Oral)   Resp (!) 23   Ht 5' 11 (1.803 m)   Wt 80.6 kg   SpO2 96%   BMI 24.78 kg/m   General: 79 y.o. year-old male well developed well nourished in no acute distress.  Alert and oriented x3. Cardiovascular: Regular rate and rhythm with no rubs or gallops.  No thyromegaly or JVD noted.  No lower extremity edema. 2/4 pulses in all 4 extremities. Respiratory: Clear to auscultation with no wheezes or rales. Good inspiratory effort. Abdomen: Soft nontender nondistended with normal bowel sounds x4 quadrants. Muskuloskeletal: No cyanosis, clubbing or edema noted bilaterally Neuro: CN II-XII intact, strength, sensation, reflexes Skin: No ulcerative lesions noted or rashes Psychiatry: Judgement and insight appear normal. Mood is appropriate for condition and setting          Labs on Admission:  Basic Metabolic Panel: Recent Labs  Lab 04/09/24 2005  NA 139  K 4.5  CL 102  CO2 14*  GLUCOSE 120*  BUN 89*  CREATININE 5.07*  CALCIUM  9.8  MG 2.1  Liver Function Tests: Recent Labs  Lab 04/09/24 2005  AST 20  ALT 8  ALKPHOS 107  BILITOT 0.7  PROT 8.3*  ALBUMIN 3.9   Recent Labs  Lab 04/09/24 2005  LIPASE 104*   No results for input(s): AMMONIA in the last 168 hours. CBC: Recent Labs  Lab 04/09/24 1829  WBC 10.4  NEUTROABS 7.2  HGB 13.3  HCT 43.2  MCV 92.1  PLT 264   Cardiac Enzymes: No results for input(s): CKTOTAL, CKMB, CKMBINDEX, TROPONINI in the last 168 hours.  BNP (last 3 results) Recent Labs    02/18/24 2305  BNP 312.3*    ProBNP (last 3 results) Recent Labs    03/07/24 1216  PROBNP 2,835.0*    CBG: No results for input(s): GLUCAP in the last 168 hours.  Radiological Exams on Admission: CT ABDOMEN PELVIS WO CONTRAST Result Date: 04/09/2024 EXAM: CT ABDOMEN AND PELVIS WITHOUT CONTRAST 04/09/2024 09:58:16 PM TECHNIQUE: CT of the abdomen and pelvis was performed without the administration of intravenous  contrast. Multiplanar reformatted images are provided for review. Automated exposure control, iterative reconstruction, and/or weight-based adjustment of the mA/kV was utilized to reduce the radiation dose to as low as reasonably achievable. COMPARISON: 03/16/2024. CLINICAL HISTORY: Abdominal pain, acute, nonlocalized. FINDINGS: LOWER CHEST: Mild patchy bilateral lower lung opacities, improved from the prior, favoring postinfectious/inflammatory scarring. LIVER: The liver is unremarkable. GALLBLADDER AND BILE DUCTS: Gallbladder is unremarkable. No biliary ductal dilatation. SPLEEN: No acute abnormality. PANCREAS: No acute abnormality. ADRENAL GLANDS: No acute abnormality. KIDNEYS, URETERS AND BLADDER: Multiple nonobstructing bilateral renal calculi measuring up to 11 mm in the right upper pole and 7 mm in the left upper pole. Bilateral renal cysts, measuring up to 3.5 cm in the left lower kidney, benign (Bosniak 1). No follow-up is recommended. Left double pigtail ureteral stent in satisfactory position. 2 right ureteral calculi along the course of the stent measuring up to 5 mm at the level of the left lower sacral ureter (image 74). 2 nonobstructing distal right ureteral calculi just above the UVJ, measuring up to 4.5 mm (image 83). Mild left hydronephrosis. Mildly thick walled, trabeculated bladder. Tiny layering right bladder calculi. Brachytherapy seeds in the prostate. No perinephric or periureteral stranding. GI AND BOWEL: Stomach demonstrates no acute abnormality. There is no bowel obstruction. PERITONEUM AND RETROPERITONEUM: No ascites. No free air. VASCULATURE: Aorta is normal in caliber. Atherosclerotic calcifications of the abdominal aorta and branch vessels. LYMPH NODES: No lymphadenopathy. REPRODUCTIVE ORGANS: Brachytherapy seeds in the prostate. BONES AND SOFT TISSUES: Degenerative changes of the lower thoracolumbar spine. Mild superior endplate compression fracture deformity at L1, chronic. No focal  soft tissue abnormality. IMPRESSION: 1. Left double pigtail ureteral stent in appropriate position. 2. Two ureteral calculi along the course of the left ureteral stent, measuring up to 5 mm in the distal left ureter, with mild left hydronephrosis. 3. Two nonobstructing distal right ureteral calculi just above the UVJ, measuring up to 4.5 mm. No right hydronephrosis. 4. Multiple nonobstructing bilateral renal calculi, measuring up to 11 mm in the right upper pole and 7 mm in the left upper pole. 5. Mildly thick-walled, trabeculated bladder with tiny layering bladder calculi. Electronically signed by: Pinkie Pebbles MD 04/09/2024 10:05 PM EST RP Workstation: [id number]    EKG: I independently viewed the EKG done and my findings are as followed: Sinus rhythm at a rate of 87.  QTc 409.  Assessment/Plan Present on Admission:  AKI (acute kidney injury)  Principal Problem:   AKI (acute  kidney injury)  AKI on CKD 5 prerenal, secondary to dehydration from poor oral intake Baseline creatinine appears to be 4.0 with GFR of 14. Presented with creatinine of 5.07 IV fluid hydration LR at 75 cc/h x 1 day Monitor urine output Avoid nephrotoxic agents, dehydration, and hypotension. Consider nephrology consultation in the morning.  High anion gap metabolic acidosis in the setting of acute renal insufficiency Serum bicarb 14, anion gap 23 Continue LR IV fluid hydration Repeat BMP in the morning.  Left ureteral stone status post stent placement Followed by urology outpatient Stent (03/01/2024) is still in place. As needed pain control Consider urology consultation in the morning.  Presumed complicated UTI, POA UA positive for pyuria Follow urine culture for ID and sensitivities Continue Rocephin  initiated in the ER. Monitor fever curve and WBCs. Consider urology consult in the morning. Dr. Sherrilee, urology, added to treatment team.  Hypertension BPs are soft currently Resume home oral  antihypertensives when blood pressure allows Continue to closely monitor vital signs.  Generalized weakness PT OT evaluation Fall precautions.  Prediabetes Last hemoglobin A1c 6.4 on 12/23/2023 Heart healthy carb modified diet Continue to monitor CBGs twice daily  History of prostate cancer Follows with urology outpatient.   Critical care time: 55 minutes.   DVT prophylaxis: Subcu heparin  3 times daily.  Code Status: Full code.  Family Communication: None at bedside.  Disposition Plan: Admitted to telemetry unit.  Consults called: None.  Admission status: Inpatient status.   Status is: Inpatient The patient requires at least 2 midnights for further evaluation and treatment of present condition.   Terry LOISE Hurst MD [facility] Pager [phone]  If 7PM-7AM, please contact night-coverage [email or web address] Password [email or web address]  04/10/2024, 12:12 AM      [1]  Allergies Allergen Reactions   Dilaudid [Hydromorphone Hcl] Anaphylaxis and Other (See Comments)    Cardiac arrest   "

## 2024-04-11 DIAGNOSIS — N179 Acute kidney failure, unspecified: Secondary | ICD-10-CM | POA: Diagnosis not present

## 2024-04-11 LAB — BASIC METABOLIC PANEL WITH GFR
Anion gap: 20 — ABNORMAL HIGH (ref 5–15)
BUN: 73 mg/dL — ABNORMAL HIGH (ref 8–23)
CO2: 15 mmol/L — ABNORMAL LOW (ref 22–32)
Calcium: 9.7 mg/dL (ref 8.9–10.3)
Chloride: 108 mmol/L (ref 98–111)
Creatinine, Ser: 3.93 mg/dL — ABNORMAL HIGH (ref 0.61–1.24)
GFR, Estimated: 15 mL/min — ABNORMAL LOW
Glucose, Bld: 102 mg/dL — ABNORMAL HIGH (ref 70–99)
Potassium: 3.9 mmol/L (ref 3.5–5.1)
Sodium: 143 mmol/L (ref 135–145)

## 2024-04-11 LAB — GLUCOSE, CAPILLARY: Glucose-Capillary: 121 mg/dL — ABNORMAL HIGH (ref 70–99)

## 2024-04-11 LAB — URINE CULTURE: Culture: NO GROWTH

## 2024-04-11 MED ORDER — SODIUM BICARBONATE 8.4 % IV SOLN
INTRAVENOUS | Status: AC
  Filled 2024-04-11 (×2): qty 1000

## 2024-04-11 MED ORDER — PANTOPRAZOLE SODIUM 40 MG PO TBEC
40.0000 mg | DELAYED_RELEASE_TABLET | Freq: Every day | ORAL | Status: DC
  Administered 2024-04-11 – 2024-04-12 (×2): 40 mg via ORAL
  Filled 2024-04-11 (×2): qty 1

## 2024-04-11 MED ORDER — HALOPERIDOL LACTATE 5 MG/ML IJ SOLN
2.0000 mg | Freq: Once | INTRAMUSCULAR | Status: AC | PRN
  Administered 2024-04-11: 2 mg via INTRAVENOUS
  Filled 2024-04-11: qty 1

## 2024-04-11 NOTE — Progress Notes (Signed)
 Mobility Specialist Progress Note:    04/11/24 1500  Mobility  Activity Pivoted/transferred from bed to chair  Level of Assistance Minimal assist, patient does 75% or more  Assistive Device None  Distance Ambulated (ft) 3 ft  Range of Motion/Exercises Active;All extremities  Activity Response Tolerated well  Mobility Referral Yes  Mobility visit 1 Mobility  Mobility Specialist Start Time (ACUTE ONLY) 1500  Mobility Specialist Stop Time (ACUTE ONLY) 1520  Mobility Specialist Time Calculation (min) (ACUTE ONLY) 20 min   Pt received in bed, agreeable to mobility. Required MinA to stand and transfer with no AD. Tolerated well, confusion during session. Alarm on, call bell in reach. All needs met.  Iriana Artley Mobility Specialist Please contact via Special Educational Needs Teacher or  Rehab office at [phone]

## 2024-04-11 NOTE — Progress Notes (Signed)
 " PROGRESS NOTE    Marcus Simpson  FMW:[id number] DOB: 02/16/1946 DOA: 04/09/2024 PCP: [name], MD   Brief Narrative:   Marcus Simpson is a 79 y.o. male with medical history significant for prostate cancer, kidney stones, left ureteral stone status post cystoscopy with left stent placement on 03/01/2024, hypertension, type 2 diabetes, who presents with complaints of generalized weakness, nausea, and poor oral intake.  Endorses minimal urine output today.  Associated with nausea and vomiting.  No reported subjective fevers or chills.   In the ER, tachypneic.  UA positive for pyuria.  Lab studies notable for BUN 89, creatinine 5.07 from 4.02 almost a month ago.  Serum bicarb 14, anion gap 23.  Lactic acid 2.2, 1.9.   Patient admitted for UTI in the setting of AKI and CKD status 5 with chronic Foley.  Assessment & Plan:   Principal Problem:   AKI (acute kidney injury)  AKI on CKD 5 prerenal, secondary to dehydration from poor oral intake Baseline creatinine appears to be 1.7-2.3 in November however this has worsened in December 2025.  Presented with creatinine of 5.07--> progressively improved to 3.93.   Bicarb persistently low.   Patient is having good urine output Will start IV bicarbonate drip today  Monitor urine output Avoid nephrotoxic agents, dehydration, and hypotension. CT showed no obstruction   High anion gap metabolic acidosis in the setting of acute renal insufficiency Serum bicarb 14, anion gap 23 Received LR, will switch fluid to IV sodium bicarbonate   repeat BMP in the morning.   Left ureteral stone status post stent placement Followed by urology outpatient Stent (03/01/2024) is still in place.  Multiple stones with no obstruction per CT. As needed pain control    Presumed complicated UTI, POA UA positive for pyuria On cefepime , follow cultures, negative so far  Hypertension Soft BP on admission, currently stable.  Generalized weakness PT OT  evaluation Fall precautions.   Prediabetes Last hemoglobin A1c 6.4 on 12/23/2023 Heart healthy carb modified diet Continue to monitor CBGs twice daily   History of prostate cancer Follows with urology outpatient.       DVT prophylaxis: Subcu heparin  3 times daily.   Code Status: Full code.   Family Communication: None at bedside.   Disposition Plan: Admitted to telemetry unit.   Consults called: None.   Admission status: Inpatient status.  Dissipate discharge tomorrow to home     Status is: Inpatient The patient requires at least 2 midnights for further evaluation and treatment of present condition.    Subjective:  Patient sitting in chair not in any acute distress.  Feels much better.  Spouse at the bedside.  Denies nausea vomiting fever abdominal pain chills.  Vital signs are stable.  Objective: Vitals:   04/10/24 1945 04/11/24 0347 04/11/24 0507 04/11/24 1305  BP: 139/83  137/82 127/77  Pulse: 94  94 88  Resp: 18   16  Temp: 98.3 F (36.8 C)  98.3 F (36.8 C)   TempSrc:      SpO2: 97%  97% 97%  Weight:  84.8 kg    Height:        Intake/Output Summary (Last 24 hours) at 04/11/2024 1310 Last data filed at 04/11/2024 0347 Gross per 24 hour  Intake 653.81 ml  Output 400 ml  Net 253.81 ml   Filed Weights   04/09/24 1814 04/10/24 0129 04/11/24 0347  Weight: 80.6 kg 85 kg 84.8 kg    Examination:  General exam: Chronically  ill-appearing, appears calm and comfortable  Respiratory system: Bilateral decreased breath sounds at bases Cardiovascular system: S1 & S2 heard, Rate controlled Gastrointestinal system: Abdomen is nondistended, soft and nontender. Normal bowel sounds heard. Extremities: No cyanosis, clubbing, edema  Central nervous system: Alert and oriented. No focal neurological deficits. Moving extremities  Data Reviewed: I have personally reviewed following labs and imaging studies  CBC: Recent Labs  Lab 04/09/24 1829  WBC 10.4  NEUTROABS  7.2  HGB 13.3  HCT 43.2  MCV 92.1  PLT 264   Basic Metabolic Panel: Recent Labs  Lab 04/09/24 2005 04/10/24 0819 04/11/24 0430  NA 139 143 143  K 4.5 4.1 3.9  CL 102 105 108  CO2 14* 17* 15*  GLUCOSE 120* 108* 102*  BUN 89* 81* 73*  CREATININE 5.07* 4.59* 3.93*  CALCIUM  9.8 9.8 9.7  MG 2.1  --   --   PHOS  --  5.2*  --    GFR: Estimated Creatinine Clearance: 16 mL/min (A) (by C-G formula based on SCr of 3.93 mg/dL (H)). Liver Function Tests: Recent Labs  Lab 04/09/24 2005 04/10/24 0819  AST 20  --   ALT 8  --   ALKPHOS 107  --   BILITOT 0.7  --   PROT 8.3*  --   ALBUMIN 3.9 3.7   Recent Labs  Lab 04/09/24 2005  LIPASE 104*   No results for input(s): AMMONIA in the last 168 hours. Coagulation Profile: No results for input(s): INR, PROTIME in the last 168 hours. Cardiac Enzymes: No results for input(s): CKTOTAL, CKMB, CKMBINDEX, TROPONINI in the last 168 hours. BNP (last 3 results) Recent Labs    03/07/24 1216  PROBNP 2,835.0*   HbA1C: No results for input(s): HGBA1C in the last 72 hours. CBG: Recent Labs  Lab 04/10/24 0723 04/10/24 1730  GLUCAP 92 97   Lipid Profile: No results for input(s): CHOL, HDL, LDLCALC, TRIG, CHOLHDL, LDLDIRECT in the last 72 hours. Thyroid  Function Tests: No results for input(s): TSH, T4TOTAL, FREET4, T3FREE, THYROIDAB in the last 72 hours. Anemia Panel: No results for input(s): VITAMINB12, FOLATE, FERRITIN, TIBC, IRON, RETICCTPCT in the last 72 hours. Sepsis Labs: Recent Labs  Lab 04/09/24 1839 04/09/24 2005  LATICACIDVEN 2.2* 1.9    Recent Results (from the past 240 hours)  Urine Culture     Status: Abnormal   Collection Time: 04/06/24  9:03 AM   Specimen: Urine   UR  Result Value Ref Range Status   Urine Culture, Routine Final report (A)  Final   Organism ID, Bacteria Candida glabrata (A)  Final    Comment: 50,000-100,000 colony forming units per mL   Microscopic Examination     Status: Abnormal   Collection Time: 04/06/24  9:03 AM   Urine  Result Value Ref Range Status   WBC, UA >30 (A) 0 - 5 /hpf Final   RBC, Urine >30 (A) 0 - 2 /hpf Final   Epithelial Cells (non renal) 0-10 0 - 10 /hpf Final   Bacteria, UA Many (A) None seen/Few Final  Urine Culture (for pregnant, neutropenic or urologic patients or patients with an indwelling urinary catheter)     Status: None   Collection Time: 04/09/24 10:41 PM   Specimen: Urine, Clean Catch  Result Value Ref Range Status   Specimen Description   Final    URINE, CLEAN CATCH Performed at [facility], [address] [place], [place] [address]    Special Requests   Final  NONE Performed at [facility], [address], [place], [place] [address]    Culture   Final    NO GROWTH Performed at [facility], [address], [place], [place] [address]    Report Status 04/11/2024 FINAL  Final         Radiology Studies: CT ABDOMEN PELVIS WO CONTRAST Result Date: 04/09/2024 EXAM: CT ABDOMEN AND PELVIS WITHOUT CONTRAST 04/09/2024 09:58:16 PM TECHNIQUE: CT of the abdomen and pelvis was performed without the administration of intravenous contrast. Multiplanar reformatted images are provided for review. Automated exposure control, iterative reconstruction, and/or weight-based adjustment of the mA/kV was utilized to reduce the radiation dose to as low as reasonably achievable. COMPARISON: 03/16/2024. CLINICAL HISTORY: Abdominal pain, acute, nonlocalized. FINDINGS: LOWER CHEST: Mild patchy bilateral lower lung opacities, improved from the prior, favoring postinfectious/inflammatory scarring. LIVER: The liver is unremarkable. GALLBLADDER AND BILE DUCTS: Gallbladder is unremarkable. No biliary ductal dilatation. SPLEEN: No acute abnormality. PANCREAS: No acute abnormality. ADRENAL GLANDS: No acute abnormality. KIDNEYS, URETERS AND BLADDER: Multiple nonobstructing bilateral renal calculi measuring up  to 11 mm in the right upper pole and 7 mm in the left upper pole. Bilateral renal cysts, measuring up to 3.5 cm in the left lower kidney, benign (Bosniak 1). No follow-up is recommended. Left double pigtail ureteral stent in satisfactory position. 2 right ureteral calculi along the course of the stent measuring up to 5 mm at the level of the left lower sacral ureter (image 74). 2 nonobstructing distal right ureteral calculi just above the UVJ, measuring up to 4.5 mm (image 83). Mild left hydronephrosis. Mildly thick walled, trabeculated bladder. Tiny layering right bladder calculi. Brachytherapy seeds in the prostate. No perinephric or periureteral stranding. GI AND BOWEL: Stomach demonstrates no acute abnormality. There is no bowel obstruction. PERITONEUM AND RETROPERITONEUM: No ascites. No free air. VASCULATURE: Aorta is normal in caliber. Atherosclerotic calcifications of the abdominal aorta and branch vessels. LYMPH NODES: No lymphadenopathy. REPRODUCTIVE ORGANS: Brachytherapy seeds in the prostate. BONES AND SOFT TISSUES: Degenerative changes of the lower thoracolumbar spine. Mild superior endplate compression fracture deformity at L1, chronic. No focal soft tissue abnormality. IMPRESSION: 1. Left double pigtail ureteral stent in appropriate position. 2. Two ureteral calculi along the course of the left ureteral stent, measuring up to 5 mm in the distal left ureter, with mild left hydronephrosis. 3. Two nonobstructing distal right ureteral calculi just above the UVJ, measuring up to 4.5 mm. No right hydronephrosis. 4. Multiple nonobstructing bilateral renal calculi, measuring up to 11 mm in the right upper pole and 7 mm in the left upper pole. 5. Mildly thick-walled, trabeculated bladder with tiny layering bladder calculi. Electronically signed by: Pinkie Pebbles MD 04/09/2024 10:05 PM EST RP Workstation: [id number]        Scheduled Meds:  heparin   5,000 Units Subcutaneous Q8H   pantoprazole   40 mg  Oral Daily   Continuous Infusions:  ceFEPime  (MAXIPIME ) IV 2 g (04/11/24 1218)   sodium bicarbonate  150 mEq in dextrose  5 % 1,150 mL infusion 75 mL/hr at 04/11/24 1025          Derryl Duval, MD [facility] 04/11/2024, 1:10 PM   "

## 2024-04-11 NOTE — Plan of Care (Signed)
" °  Problem: Education: Goal: Knowledge of General Education information will improve Description: Including pain rating scale, medication(s)/side effects and non-pharmacologic comfort measures 04/11/2024 1643 by Alvina Deland SAILOR, RN Outcome: Progressing 04/11/2024 1643 by Alvina Deland SAILOR, RN Outcome: Progressing   Problem: Health Behavior/Discharge Planning: Goal: Ability to manage health-related needs will improve 04/11/2024 1643 by Alvina Deland SAILOR, RN Outcome: Progressing 04/11/2024 1643 by Alvina Deland SAILOR, RN Outcome: Progressing   Problem: Clinical Measurements: Goal: Ability to maintain clinical measurements within normal limits will improve 04/11/2024 1643 by Alvina Deland SAILOR, RN Outcome: Progressing 04/11/2024 1643 by Alvina Deland SAILOR, RN Outcome: Progressing Goal: Will remain free from infection 04/11/2024 1643 by Alvina Deland SAILOR, RN Outcome: Progressing 04/11/2024 1643 by Alvina Deland SAILOR, RN Outcome: Progressing Goal: Diagnostic test results will improve 04/11/2024 1643 by Alvina Deland SAILOR, RN Outcome: Progressing 04/11/2024 1643 by Alvina Deland SAILOR, RN Outcome: Progressing Goal: Respiratory complications will improve 04/11/2024 1643 by Alvina Deland SAILOR, RN Outcome: Progressing 04/11/2024 1643 by Alvina Deland SAILOR, RN Outcome: Progressing Goal: Cardiovascular complication will be avoided 04/11/2024 1643 by Alvina Deland SAILOR, RN Outcome: Progressing 04/11/2024 1643 by Alvina Deland SAILOR, RN Outcome: Progressing   Problem: Activity: Goal: Risk for activity intolerance will decrease 04/11/2024 1643 by Alvina Deland SAILOR, RN Outcome: Progressing 04/11/2024 1643 by Alvina Deland SAILOR, RN Outcome: Progressing   Problem: Nutrition: Goal: Adequate nutrition will be maintained 04/11/2024 1643 by Alvina Deland SAILOR, RN Outcome: Progressing 04/11/2024 1643 by Alvina Deland SAILOR, RN Outcome: Progressing   Problem: Coping: Goal: Level of anxiety will decrease 04/11/2024  1643 by Alvina Deland SAILOR, RN Outcome: Progressing 04/11/2024 1643 by Alvina Deland SAILOR, RN Outcome: Progressing   Problem: Elimination: Goal: Will not experience complications related to bowel motility 04/11/2024 1643 by Alvina Deland SAILOR, RN Outcome: Progressing 04/11/2024 1643 by Alvina Deland SAILOR, RN Outcome: Progressing Goal: Will not experience complications related to urinary retention 04/11/2024 1643 by Alvina Deland SAILOR, RN Outcome: Progressing 04/11/2024 1643 by Alvina Deland SAILOR, RN Outcome: Progressing   Problem: Pain Managment: Goal: General experience of comfort will improve and/or be controlled 04/11/2024 1643 by Alvina Deland SAILOR, RN Outcome: Progressing 04/11/2024 1643 by Alvina Deland SAILOR, RN Outcome: Progressing   Problem: Safety: Goal: Ability to remain free from injury will improve 04/11/2024 1643 by Alvina Deland SAILOR, RN Outcome: Progressing 04/11/2024 1643 by Alvina Deland SAILOR, RN Outcome: Progressing   Problem: Skin Integrity: Goal: Risk for impaired skin integrity will decrease 04/11/2024 1643 by Alvina Deland SAILOR, RN Outcome: Progressing 04/11/2024 1643 by Alvina Deland SAILOR, RN Outcome: Progressing   "

## 2024-04-12 ENCOUNTER — Ambulatory Visit: Payer: Self-pay

## 2024-04-12 DIAGNOSIS — N179 Acute kidney failure, unspecified: Secondary | ICD-10-CM | POA: Diagnosis not present

## 2024-04-12 LAB — RENAL FUNCTION PANEL
Albumin: 3.5 g/dL (ref 3.5–5.0)
Anion gap: 18 — ABNORMAL HIGH (ref 5–15)
BUN: 63 mg/dL — ABNORMAL HIGH (ref 8–23)
CO2: 21 mmol/L — ABNORMAL LOW (ref 22–32)
Calcium: 9.6 mg/dL (ref 8.9–10.3)
Chloride: 105 mmol/L (ref 98–111)
Creatinine, Ser: 3.23 mg/dL — ABNORMAL HIGH (ref 0.61–1.24)
GFR, Estimated: 19 mL/min — ABNORMAL LOW
Glucose, Bld: 145 mg/dL — ABNORMAL HIGH (ref 70–99)
Phosphorus: 3.4 mg/dL (ref 2.5–4.6)
Potassium: 3.5 mmol/L (ref 3.5–5.1)
Sodium: 143 mmol/L (ref 135–145)

## 2024-04-12 LAB — GLUCOSE, CAPILLARY: Glucose-Capillary: 150 mg/dL — ABNORMAL HIGH (ref 70–99)

## 2024-04-12 MED ORDER — SODIUM BICARBONATE 325 MG PO TABS: 325.0000 mg | ORAL_TABLET | Freq: Two times a day (BID) | ORAL | 0 refills | Status: AC

## 2024-04-12 MED ORDER — PANTOPRAZOLE SODIUM 40 MG PO TBEC: 40.0000 mg | DELAYED_RELEASE_TABLET | Freq: Every day | ORAL | 0 refills | Status: AC

## 2024-04-12 MED ORDER — FLUCONAZOLE 100 MG PO TABS
200.0000 mg | ORAL_TABLET | Freq: Every day | ORAL | Status: DC
  Administered 2024-04-12: 200 mg via ORAL
  Filled 2024-04-12: qty 2

## 2024-04-12 MED ORDER — FLUCONAZOLE 100 MG PO TABS: 100.0000 mg | ORAL_TABLET | Freq: Every day | ORAL | 0 refills | Status: AC

## 2024-04-12 NOTE — Telephone Encounter (Signed)
 Wife informed of patients urine culture results and diflucan  sent to pharmacy.  Wife voiced understanding.

## 2024-04-12 NOTE — Plan of Care (Signed)

## 2024-04-12 NOTE — Discharge Summary (Signed)
 Physician Discharge Summary  Marcus Simpson FMW:[id number] DOB: [birth year] DOA: 04/09/2024  PCP: [name], MD  Admit date: 04/09/2024 Discharge date: 04/12/2024  Admitted From: Home Disposition: Home with home health PT  Recommendations for Outpatient Follow-up:  Follow up with PCP in 1 week with repeat CBC/BMP Continue to hydrate yourself well Follow-up with urology regarding stent and kidney stones Follow-up with nephrology regarding CKD Follow up in ED if symptoms worsen or new appear   Discharge Condition: Stable CODE STATUS: Full Diet recommendation: Heart healthy  Brief/Interim Summary:  Marcus Simpson is a 79 y.o. male with medical history significant for prostate cancer, kidney stones, left ureteral stone status post cystoscopy with left stent placement on 03/01/2024, hypertension, type 2 diabetes, who presented to the ER with complaints of generalized weakness, nausea, and poor oral intake as well as  minimal urine output.  Associated with nausea and vomiting.  No reported subjective fevers or chills.   In the ER, tachypneic.  UA positive for pyuria.  Lab studies notable for BUN 89, creatinine 5.07 from 4.02 almost a month ago.  Serum bicarb 14, anion gap 23.  Lactic acid 2.2-> 1.9.   Patient admitted for UTI in the setting of AKI and CKDstage4   Assessment & Plan:   AKI on CKD 4 prerenal, secondary to dehydration from poor oral intake High anion gap acidosis CT scan revealed stent in good placed, multiple urolithiasis but no obstruction noted. Baseline creatinine appears to be 1.7-2.3 in November however this has worsened in December 2025.  Presented with creatinine of 5.07--> progressively improved to 3.2 on the day of discharge (1/22).   Bicarb persistently low and received IV bicarbonate drip for 24 hours prior to discharge.  Will discharge on low-dose oral bicarbonate for 2 weeks and follow-up outpatient Patient is having good urine output Discussed  maintaining good hydration.  CT showed no obstruction    Left ureteral stone status post stent placement Followed by urology outpatient Stent (03/01/2024) is still in place.  Multiple stones with no obstruction per CT. Plan for outpatient removal  Complicated urinary tract infection, POA: UA positive for pyuria on admission Patient received IV cefepime  while inpatient.  Urine culture obtained from urology office on 1/16 grew Candida glabrata, urine culture obtained in the ER 1/19 showed no growth.  Discussed with ID Mayanka Singh.  Since repeat urine culture on 1/19 showed no growth without any antifungal treatment, antifungal therapy was felt not warranted.  No antimicrobials on discharge   Hypertension Soft BP on admission, stabilized  Generalized weakness Will have home physical therapy.   Prediabetes Last hemoglobin A1c 6.4 on 12/23/2023 Heart healthy carb modified diet Continue to monitor CBGs twice daily   History of prostate cancer Follows with urology outpatient.     Discharge Diagnoses:  Principal Problem:   AKI (acute kidney injury) Active Problems:   Bilateral nephrolithiasis   Hyperlipidemia   Essential hypertension   Prostate cancer (HCC)   Generalized weakness   Metabolic acidosis   Acute renal failure superimposed on stage 4 chronic kidney disease [facility])    Discharge Instructions  Discharge Instructions     Ambulatory referral to Nephrology   Complete by: As directed    CKD   Call MD for:  extreme fatigue   Complete by: As directed    Call MD for:  persistant nausea and vomiting   Complete by: As directed    Call MD for:  severe uncontrolled pain   Complete by:  As directed    Call MD for:  temperature >100.4   Complete by: As directed    Discharge instructions   Complete by: As directed    1.  Please continue follow-up with urology. 2.  Will place referral to nephrology for management of CKD   Face-to-face encounter (required for  Medicare/Medicaid patients)   Complete by: As directed    I Rashonda Warrior certify that this patient is under my care and that I, or a nurse practitioner or physician's assistant working with me, had a face-to-face encounter that meets the physician face-to-face encounter requirements with this patient on 04/12/2024. The encounter with the patient was in whole, or in part for the following medical condition(s) which is the primary reason for home health care (List medical condition): AKI on CKD, physical  deconditioning   The encounter with the patient was in whole, or in part, for the following medical condition, which is the primary reason for home health care: Physical deconditioning   I certify that, based on my findings, the following services are medically necessary home health services: Physical therapy   Reason for Medically Necessary Home Health Services: Therapy- Therapeutic Exercises to Increase Strength and Endurance   My clinical findings support the need for the above services: Shortness of breath with activity   Further, I certify that my clinical findings support that this patient is homebound due to: Ambulates short distances less than 300 feet   Home Health   Complete by: As directed    To provide the following care/treatments: PT   Increase activity slowly   Complete by: As directed       Allergies as of 04/12/2024       Reactions   Dilaudid [hydromorphone Hcl] Anaphylaxis, Other (See Comments)   Cardiac arrest        Medication List     STOP taking these medications    cefUROXime 500 MG tablet Commonly known as: CEFTIN   doxycycline  100 MG capsule Commonly known as: VIBRAMYCIN        TAKE these medications    aspirin  EC 81 MG tablet Take 81 mg by mouth daily.   benzonatate 200 MG capsule Commonly known as: TESSALON Take 400 mg by mouth every 8 (eight) hours as needed.   clotrimazole -betamethasone  cream Commonly known as: LOTRISONE  Apply 1 Application  topically 2 (two) times daily.   diphenhydrAMINE  25 MG tablet Commonly known as: BENADRYL  Take 25 mg by mouth every 6 (six) hours as needed for allergies.   glipiZIDE 2.5 MG 24 hr tablet Commonly known as: GLUCOTROL XL Take 2.5 mg by mouth daily.   Lancets Misc 1 each by Does not apply route as directed. Dispense based on patient and insurance preference. Use up to four times daily as directed. (FOR ICD-10 E10.9, E11.9).   meclizine  25 MG tablet Commonly known as: ANTIVERT  Take 25 mg by mouth 2 (two) times daily.   metoprolol  succinate 25 MG 24 hr tablet Commonly known as: TOPROL -XL Take 1 tablet (25 mg total) by mouth daily.   mirtazapine 7.5 MG tablet Commonly known as: REMERON Take 7.5 mg by mouth at bedtime.   nystatin  100000 UNIT/ML suspension Commonly known as: MYCOSTATIN  Take 5 mLs by mouth 3 (three) times daily.   ONE A DAY MEN 50 PLUS PO Take 1 tablet by mouth daily.   OneTouch Ultra Test test strip Generic drug: glucose blood 1 each by Other route daily.   BLOOD GLUCOSE TEST STRIPS Strp 1 each by  Does not apply route as directed. Dispense based on patient and insurance preference. Use up to four times daily as directed. (FOR ICD-10 E10.9, E11.9).   pantoprazole  40 MG tablet Commonly known as: PROTONIX  Take 1 tablet (40 mg total) by mouth daily. Start taking on: April 13, 2024   rosuvastatin  5 MG tablet Commonly known as: CRESTOR  Take 5 mg by mouth every Saturday.   sodium bicarbonate  325 MG tablet Take 1 tablet (325 mg total) by mouth 2 (two) times daily.   tamsulosin  0.4 MG Caps capsule Commonly known as: FLOMAX  Take 1 capsule (0.4 mg total) by mouth daily.   Vitamin B-12 2500 MCG Subl Place 2,500 mcg under the tongue daily.   Vitamin D3 125 MCG (5000 UT) Caps Take 1 capsule by mouth daily.        Contact information for follow-up providers     Vyas, Leta B, MD. Schedule an appointment as soon as possible for a visit in 1 week(s).    Specialty: Internal Medicine Why: check CBC, BMP Contact information: [address] [place] [address] 336 [phone]              Contact information for after-discharge care     Home Medical Care     [facility]) .   Service: Home Health Services Contact information: [address] [place]  [phone]                    Allergies[1]  Consultations:    Procedures/Studies: CT ABDOMEN PELVIS WO CONTRAST Result Date: 04/09/2024 EXAM: CT ABDOMEN AND PELVIS WITHOUT CONTRAST 04/09/2024 09:58:16 PM TECHNIQUE: CT of the abdomen and pelvis was performed without the administration of intravenous contrast. Multiplanar reformatted images are provided for review. Automated exposure control, iterative reconstruction, and/or weight-based adjustment of the mA/kV was utilized to reduce the radiation dose to as low as reasonably achievable. COMPARISON: 03/16/2024. CLINICAL HISTORY: Abdominal pain, acute, nonlocalized. FINDINGS: LOWER CHEST: Mild patchy bilateral lower lung opacities, improved from the prior, favoring postinfectious/inflammatory scarring. LIVER: The liver is unremarkable. GALLBLADDER AND BILE DUCTS: Gallbladder is unremarkable. No biliary ductal dilatation. SPLEEN: No acute abnormality. PANCREAS: No acute abnormality. ADRENAL GLANDS: No acute abnormality. KIDNEYS, URETERS AND BLADDER: Multiple nonobstructing bilateral renal calculi measuring up to 11 mm in the right upper pole and 7 mm in the left upper pole. Bilateral renal cysts, measuring up to 3.5 cm in the left lower kidney, benign (Bosniak 1). No follow-up is recommended. Left double pigtail ureteral stent in satisfactory position. 2 right ureteral calculi along the course of the stent measuring up to 5 mm at the level of the left lower sacral ureter (image 74). 2 nonobstructing distal right ureteral calculi just above the UVJ, measuring up to 4.5 mm (image 83). Mild left  hydronephrosis. Mildly thick walled, trabeculated bladder. Tiny layering right bladder calculi. Brachytherapy seeds in the prostate. No perinephric or periureteral stranding. GI AND BOWEL: Stomach demonstrates no acute abnormality. There is no bowel obstruction. PERITONEUM AND RETROPERITONEUM: No ascites. No free air. VASCULATURE: Aorta is normal in caliber. Atherosclerotic calcifications of the abdominal aorta and branch vessels. LYMPH NODES: No lymphadenopathy. REPRODUCTIVE ORGANS: Brachytherapy seeds in the prostate. BONES AND SOFT TISSUES: Degenerative changes of the lower thoracolumbar spine. Mild superior endplate compression fracture deformity at L1, chronic. No focal soft tissue abnormality. IMPRESSION: 1. Left double pigtail ureteral stent in appropriate position. 2. Two ureteral calculi along the course of the left ureteral stent,  measuring up to 5 mm in the distal left ureter, with mild left hydronephrosis. 3. Two nonobstructing distal right ureteral calculi just above the UVJ, measuring up to 4.5 mm. No right hydronephrosis. 4. Multiple nonobstructing bilateral renal calculi, measuring up to 11 mm in the right upper pole and 7 mm in the left upper pole. 5. Mildly thick-walled, trabeculated bladder with tiny layering bladder calculi. Electronically signed by: Pinkie Pebbles MD 04/09/2024 10:05 PM EST RP Workstation: [id number]   CT RENAL STONE STUDY Result Date: 03/16/2024 EXAM: CT UROGRAM 03/16/2024 09:14:34 AM TECHNIQUE: CT of the abdomen and pelvis was performed without the administration of intravenous contrast as per CT urogram protocol. Multiplanar reformatted images as well as MIP urogram images are provided for review. Automated exposure control, iterative reconstruction, and/or weight based adjustment of the mA/kV was utilized to reduce the radiation dose to as low as reasonably achievable. COMPARISON: CT chest 03/08/2024, CT abdomen 03/07/2024. CLINICAL HISTORY: Increasing WBC, hx of left  ureteral stent. FINDINGS: LOWER CHEST: Again demonstrated density bibasilar pulmonary infiltrates slightly improved from comparison exam. LIVER: The liver is unremarkable. GALLBLADDER AND BILE DUCTS: Gallbladder is unremarkable. No biliary ductal dilatation. SPLEEN: No acute abnormality. PANCREAS: No acute abnormality. ADRENAL GLANDS: No acute abnormality. KIDNEYS, URETERS AND BLADDER: Left double J ureteral stent in proper position. No left hydronephrosis or hydroureter. There are bilateral nonobstructing renal calculi. No ureteral lithiasis. There are several small calculi within the dome of the bladder measuring 2 to 3 mm each. Bilateral simple fluid density renal lesions are unchanged. Per consensus, no follow-up is needed for simple Bosniak type 1 and 2 renal cysts, unless the patient has a malignancy history or risk factors. GI AND BOWEL: Stomach demonstrates no acute abnormality. There is no bowel obstruction. PERITONEUM AND RETROPERITONEUM: No ascites. No free air. VASCULATURE: Aorta is normal in caliber. LYMPH NODES: No lymphadenopathy. REPRODUCTIVE ORGANS: Multiple brachytherapy seeds positioned throughout the prostate. BONES AND SOFT TISSUES: No acute osseous abnormality. No focal soft tissue abnormality. IMPRESSION: 1. Left double J ureteral stent in proper position, with no left hydronephrosis or hydroureter. 2. Bibasilar pulmonary infiltrates, slightly improved from the comparison exam. 3. Bilateral nonobstructing renal calculi, with no ureteral lithiasis. 4. Several small bladder calculi measuring 2 to 3 mm each. Electronically signed by: Norleen Boxer MD 03/16/2024 09:45 AM EST RP Workstation: [id number]   CT CHEST WO CONTRAST Result Date: 03/15/2024 EXAM: CT CHEST WITHOUT CONTRAST 03/15/2024 05:55:00 PM TECHNIQUE: CT of the chest was performed without the administration of intravenous contrast. Multiplanar reformatted images are provided for review. Automated exposure control, iterative  reconstruction, and/or weight based adjustment of the mA/kV was utilized to reduce the radiation dose to as low as reasonably achievable. COMPARISON: CT 03/08/2024. CLINICAL HISTORY: Dyspnea, chronic, unclear etiology; Respiratory illness, nondiagnostic xray; pneumonia, dyspnea, increasing wbc. FINDINGS: MEDIASTINUM: Heart and pericardium are unremarkable. The central airways are clear. Coronary artery and aortic atherosclerotic calcification. Dilated ascending aorta measuring 41 mm. LYMPH NODES: No mediastinal, hilar or axillary lymphadenopathy. LUNGS AND PLEURA: Slight improvement in peribronchovascular and peripheral consolidative opacities diffusely throughout both lungs when compared with 03/08/2024. No pleural effusion or pneumothorax. SOFT TISSUES/BONES: No acute abnormality of the bones or soft tissues. UPPER ABDOMEN: Limited images of the upper abdomen demonstrates no acute abnormality. IMPRESSION: 1. Slight improvement in multifocal pneumonia. 2. Dilated ascending aorta measuring 4.1 cm.Recommend annual imaging followup by CTA or MRA. This recommendation follows 2010 ACCF/AHA/AATS/ACR/ASA/SCA/SCAI/SIR/STS/SVM Guidelines for the Diagnosis and Management of Patients with Thoracic Aortic Disease. Circulation. 2010;  121: [id number]. Aortic aneurysm NOS (ICD10-I71.9) Electronically signed by: Norman Gatlin MD 03/15/2024 07:45 PM EST RP Workstation: [id number]      Subjective: Feels much better.  Wants to go home.  Was a little tired after ambulating today  Discharge Exam: Vitals:   04/11/24 1956 04/12/24 0422  BP: 91/80 (!) 131/51  Pulse: 86 95  Resp:    Temp: 97.6 F (36.4 C) 98.5 F (36.9 C)  SpO2: 99% 94%   General exam: Chronically ill-appearing, appears calm and comfortable  Respiratory system: Bilateral decreased breath sounds at bases Cardiovascular system: S1 & S2 heard, Rate controlled Gastrointestinal system: Abdomen is nondistended, soft and nontender. Normal bowel sounds  heard. Extremities: No cyanosis, clubbing, edema  Central nervous system: Alert and oriented. No focal neurological deficits. Moving extremities    The results of significant diagnostics from this hospitalization (including imaging, microbiology, ancillary and laboratory) are listed below for reference.     Microbiology: Recent Results (from the past 240 hours)  Urine Culture     Status: Abnormal   Collection Time: 04/06/24  9:03 AM   Specimen: Urine   UR  Result Value Ref Range Status   Urine Culture, Routine Final report (A)  Final   Organism ID, Bacteria Candida glabrata (A)  Final    Comment: 50,000-100,000 colony forming units per mL  Microscopic Examination     Status: Abnormal   Collection Time: 04/06/24  9:03 AM   Urine  Result Value Ref Range Status   WBC, UA >30 (A) 0 - 5 /hpf Final   RBC, Urine >30 (A) 0 - 2 /hpf Final   Epithelial Cells (non renal) 0-10 0 - 10 /hpf Final   Bacteria, UA Many (A) None seen/Few Final  Urine Culture (for pregnant, neutropenic or urologic patients or patients with an indwelling urinary catheter)     Status: None   Collection Time: 04/09/24 10:41 PM   Specimen: Urine, Clean Catch  Result Value Ref Range Status   Specimen Description   Final    URINE, CLEAN CATCH Performed at [facility], [address] [place], [place] [address]    Special Requests   Final    NONE Performed at [facility], [address], [place], [place] [address]    Culture   Final    NO GROWTH Performed at [facility], [address] [place], [place] [address]    Report Status 04/11/2024 FINAL  Final     Labs: BNP (last 3 results) Recent Labs    02/18/24 2305  BNP 312.3*   Basic Metabolic Panel: Recent Labs  Lab 04/09/24 2005 04/10/24 0819 04/11/24 0430 04/12/24 0439  NA 139 143 143 143  K 4.5 4.1 3.9 3.5  CL 102 105 108 105  CO2 14* 17* 15* 21*  GLUCOSE 120* 108* 102* 145*  BUN 89* 81* 73* 63*  CREATININE 5.07* 4.59* 3.93* 3.23*   CALCIUM  9.8 9.8 9.7 9.6  MG 2.1  --   --   --   PHOS  --  5.2*  --  3.4   Liver Function Tests: Recent Labs  Lab 04/09/24 2005 04/10/24 0819 04/12/24 0439  AST 20  --   --   ALT 8  --   --   ALKPHOS 107  --   --   BILITOT 0.7  --   --   PROT 8.3*  --   --   ALBUMIN 3.9 3.7 3.5   Recent Labs  Lab 04/09/24  2005  LIPASE 104*   No results for input(s): AMMONIA in the last 168 hours. CBC: Recent Labs  Lab 04/09/24 1829  WBC 10.4  NEUTROABS 7.2  HGB 13.3  HCT 43.2  MCV 92.1  PLT 264   Cardiac Enzymes: No results for input(s): CKTOTAL, CKMB, CKMBINDEX, TROPONINI in the last 168 hours. BNP: Invalid input(s): POCBNP CBG: Recent Labs  Lab 04/10/24 0723 04/10/24 1730 04/11/24 1851 04/12/24 0708  GLUCAP 92 97 121* 150*   D-Dimer No results for input(s): DDIMER in the last 72 hours. Hgb A1c No results for input(s): HGBA1C in the last 72 hours. Lipid Profile No results for input(s): CHOL, HDL, LDLCALC, TRIG, CHOLHDL, LDLDIRECT in the last 72 hours. Thyroid  function studies No results for input(s): TSH, T4TOTAL, T3FREE, THYROIDAB in the last 72 hours.  Invalid input(s): FREET3 Anemia work up No results for input(s): VITAMINB12, FOLATE, FERRITIN, TIBC, IRON, RETICCTPCT in the last 72 hours. Urinalysis    Component Value Date/Time   COLORURINE YELLOW 04/09/2024 2241   APPEARANCEUR TURBID (A) 04/09/2024 2241   APPEARANCEUR Turbid (A) 04/06/2024 0903   LABSPEC 1.012 04/09/2024 2241   PHURINE 5.0 04/09/2024 2241   GLUCOSEU NEGATIVE 04/09/2024 2241   HGBUR LARGE (A) 04/09/2024 2241   BILIRUBINUR NEGATIVE 04/09/2024 2241   BILIRUBINUR Negative 04/06/2024 0903   KETONESUR NEGATIVE 04/09/2024 2241   PROTEINUR 100 (A) 04/09/2024 2241   NITRITE NEGATIVE 04/09/2024 2241   LEUKOCYTESUR LARGE (A) 04/09/2024 2241   Sepsis Labs Recent Labs  Lab 04/09/24 1829  WBC 10.4   Microbiology Recent Results (from the past  240 hours)  Urine Culture     Status: Abnormal   Collection Time: 04/06/24  9:03 AM   Specimen: Urine   UR  Result Value Ref Range Status   Urine Culture, Routine Final report (A)  Final   Organism ID, Bacteria Candida glabrata (A)  Final    Comment: 50,000-100,000 colony forming units per mL  Microscopic Examination     Status: Abnormal   Collection Time: 04/06/24  9:03 AM   Urine  Result Value Ref Range Status   WBC, UA >30 (A) 0 - 5 /hpf Final   RBC, Urine >30 (A) 0 - 2 /hpf Final   Epithelial Cells (non renal) 0-10 0 - 10 /hpf Final   Bacteria, UA Many (A) None seen/Few Final  Urine Culture (for pregnant, neutropenic or urologic patients or patients with an indwelling urinary catheter)     Status: None   Collection Time: 04/09/24 10:41 PM   Specimen: Urine, Clean Catch  Result Value Ref Range Status   Specimen Description   Final    URINE, CLEAN CATCH Performed at [facility], [address], [place], [place] [address]    Special Requests   Final    NONE Performed at [facility], [address] [place], [place] [address]    Culture   Final    NO GROWTH Performed at [facility], [address], [place], [place] [address]    Report Status 04/11/2024 FINAL  Final     Time coordinating discharge: 35 minutes  SIGNED:   Derryl Duval, MD  [facility] 04/12/2024, 4:52 PM       [1]  Allergies Allergen Reactions   Dilaudid [Hydromorphone Hcl] Anaphylaxis and Other (See Comments)    Cardiac arrest

## 2024-04-12 NOTE — Progress Notes (Signed)
 Physical Therapy Treatment Patient Details Name: Marcus Simpson MRN: [id number] DOB: [birth year] Today's Date: 04/12/2024   History of Present Illness Marcus Simpson is a 79 y.o. male with medical history significant for prostate cancer, kidney stones, left ureteral stone status post cystoscopy with left stent placement on 03/01/2024, hypertension, type 2 diabetes, who presents with complaints of generalized weakness, nausea, and poor oral intake.  Endorses minimal urine output today.  Associated with nausea and vomiting.  No reported subjective fevers or chills.    PT Comments  Patient presents in chair and agreeable for therapy. Patient demonstrates slightly labored movement for completing sit to stands using RW and tolerated ambulating into hallway before requesting to return to room due to fatigue. Patient tolerated sitting up in chair after therapy with his spouse present. Patient issued gait belt for spouse to use for assisting at home with understanding acknowledged patient and his spouse. Patient will benefit from continued skilled physical therapy in hospital and recommended venue below to increase strength, balance, endurance for safe ADLs and gait.      If plan is discharge home, recommend the following: A little help with walking and/or transfers;Help with stairs or ramp for entrance;Assist for transportation;Assistance with cooking/housework;A little help with bathing/dressing/bathroom   Can travel by private vehicle        Equipment Recommendations  None recommended by PT    Recommendations for Other Services       Precautions / Restrictions Precautions Precautions: Fall Recall of Precautions/Restrictions: Intact Restrictions Weight Bearing Restrictions Per Provider Order: No     Mobility  Bed Mobility               General bed mobility comments: Patient presents seated in chair (assisted by nursing staff)    Transfers Overall transfer level: Needs  assistance Equipment used: Rolling walker (2 wheels) Transfers: Sit to/from Stand Sit to Stand: Contact guard assist, Min assist   Step pivot transfers: Contact guard assist, Min assist       General transfer comment: labored movement, increased time without loss of balance using RW    Ambulation/Gait Ambulation/Gait assistance: Contact guard assist Gait Distance (Feet): 35 Feet Assistive device: Rolling walker (2 wheels) Gait Pattern/deviations: Step-through pattern, Decreased step length - right, Decreased step length - left, Decreased stride length, Trunk flexed Gait velocity: Decreased     General Gait Details: slow labored movment without loss of balance using RW, but limited mostly due to c/o fatigue   Stairs             Wheelchair Mobility     Tilt Bed    Modified Rankin (Stroke Patients Only)       Balance Overall balance assessment: Needs assistance Sitting-balance support: Feet supported, No upper extremity supported Sitting balance-Leahy Scale: Good Sitting balance - Comments: seated in chair   Standing balance support: Reliant on assistive device for balance, During functional activity, Bilateral upper extremity supported Standing balance-Leahy Scale: Fair Standing balance comment: using RW                            Communication Communication Communication: No apparent difficulties  Cognition Arousal: Alert Behavior During Therapy: WFL for tasks assessed/performed                             Following commands: Intact      Cueing Cueing Techniques: Verbal cues, Tactile cues  Exercises      General Comments        Pertinent Vitals/Pain Pain Assessment Pain Assessment: No/denies pain    Home Living                          Prior Function            PT Goals (current goals can now be found in the care plan section) Acute Rehab PT Goals Patient Stated Goal: Return home and continue HHPT PT  Goal Formulation: With patient/family Time For Goal Achievement: 04/13/24 Potential to Achieve Goals: Good Progress towards PT goals: Progressing toward goals    Frequency    Min 3X/week      PT Plan      Co-evaluation              AM-PAC PT 6 Clicks Mobility   Outcome Measure  Help needed turning from your back to your side while in a flat bed without using bedrails?: None Help needed moving from lying on your back to sitting on the side of a flat bed without using bedrails?: A Little Help needed moving to and from a bed to a chair (including a wheelchair)?: A Little Help needed standing up from a chair using your arms (e.g., wheelchair or bedside chair)?: A Little Help needed to walk in hospital room?: A Little Help needed climbing 3-5 steps with a railing? : A Lot 6 Click Score: 18    End of Session Equipment Utilized During Treatment: Gait belt Activity Tolerance: Patient tolerated treatment well;Patient limited by fatigue Patient left: in chair;with call bell/phone within reach;with family/visitor present Nurse Communication: Mobility status PT Visit Diagnosis: Unsteadiness on feet (R26.81);Other abnormalities of gait and mobility (R26.89);Muscle weakness (generalized) (M62.81)     Time: 9055-8990 PT Time Calculation (min) (ACUTE ONLY): 25 min  Charges:    $Gait Training: 8-22 mins $Therapeutic Activity: 8-22 mins PT General Charges $$ ACUTE PT VISIT: 1 Visit                     12:25 PM, 04/12/24 Lynwood Music, MPT Physical Therapist with [facility] [phone] office [phone] mobile phone

## 2024-04-12 NOTE — TOC Transition Note (Signed)
 Transition of Care [facility]) - Discharge Note   Patient Details  Name: Marcus Simpson MRN: [id number] Date of Birth: 04-06-1945  Transition of Care [facility]) CM/SW Contact:  Marcus Simpson, LCSWA Phone Number: 04/12/2024, 1:23 PM   Clinical Narrative:    CSW updated that pt is medically stable for D/C home today. Pt is active with [facility]. CSW updated Dorothe with [facility] of plans for D/C today, MD placed orders. HH will follow pt in community. TOC signing off.   Final next level of care: Home w Home Health Services Barriers to Discharge: Barriers Resolved   Patient Goals and CMS Choice Patient states their goals for this hospitalization and ongoing recovery are:: return home CMS Medicare.gov Compare Post Acute Care list provided to:: Patient Choice offered to / list presented to : Patient, Spouse, Adult Children      Discharge Placement                       Discharge Plan and Services Additional resources added to the After Visit Summary for       Post Acute Care Choice: Durable Medical Equipment, Home Health                    HH Arranged: PT [facility] Agency: [facility] Date [facility] Agency Contacted: 04/12/24 Time HH Agency Contacted: 1424 Representative spoke with at [facility] Agency: Dorothe  Social Drivers of Health (SDOH) Interventions SDOH Screenings   Food Insecurity: No Food Insecurity (04/10/2024)  Housing: Low Risk (04/10/2024)  Transportation Needs: No Transportation Needs (04/10/2024)  Utilities: Not At Risk (04/10/2024)  Alcohol Screen: Low Risk (10/11/2023)  Depression (PHQ2-9): Low Risk (01/19/2024)  Social Connections: Moderately Integrated (04/10/2024)  Recent Concern: Social Connections - Moderately Isolated (02/05/2024)  Tobacco Use: Medium Risk (04/10/2024)     Readmission Risk Interventions    04/10/2024    2:22 PM 03/08/2024    1:38 PM 02/23/2024    3:25 PM  Readmission Risk Prevention Plan  Transportation Screening Complete Complete  Complete  PCP or Specialist Appt within 3-5 Days   Complete  HRI or Home Care Consult  Complete Complete  Social Work Consult for Recovery Care Planning/Counseling  Complete Complete  Palliative Care Screening  Not Applicable Not Applicable  Medication Review [employer]) Complete  Complete  HRI or Home Care Consult Complete    SW Recovery Care/Counseling Consult Complete    Palliative Care Screening Not Applicable    Skilled Nursing Facility Complete

## 2024-04-12 NOTE — Telephone Encounter (Signed)
-----   Message from [name], MD sent at 04/12/2024  1:50 PM EST ----- Diflucan  100mg  daily for 7 days

## 2024-04-12 NOTE — Care Management Important Message (Signed)
 Important Message  Patient Details  Name: Marcus Simpson MRN: [id number] Date of Birth: 1945-12-20   Important Message Given:  N/A - LOS <3 / Initial given by admissions     Khalen Styer L Layne Dilauro 04/12/2024, 12:58 PM

## 2024-04-13 ENCOUNTER — Ambulatory Visit: Admitting: Urology

## 2024-04-13 VITALS — BP 122/75 | HR 96

## 2024-04-13 DIAGNOSIS — C61 Malignant neoplasm of prostate: Secondary | ICD-10-CM

## 2024-04-13 DIAGNOSIS — Z466 Encounter for fitting and adjustment of urinary device: Secondary | ICD-10-CM

## 2024-04-13 LAB — URINALYSIS, ROUTINE W REFLEX MICROSCOPIC
Bilirubin, UA: NEGATIVE
Glucose, UA: NEGATIVE
Ketones, UA: NEGATIVE
Nitrite, UA: NEGATIVE
Specific Gravity, UA: 1.02 (ref 1.005–1.030)
Urobilinogen, Ur: 0.2 mg/dL (ref 0.2–1.0)
pH, UA: 6 (ref 5.0–7.5)

## 2024-04-13 LAB — MICROSCOPIC EXAMINATION
RBC, Urine: >30 /HPF — AB (ref 0–2)
WBC, UA: >30 /HPF — AB (ref 0–5)

## 2024-04-13 MED ORDER — SULFAMETHOXAZOLE-TRIMETHOPRIM 800-160 MG PO TABS: 1.0000 | ORAL_TABLET | Freq: Once | ORAL | 0 refills | Status: AC

## 2024-04-17 ENCOUNTER — Encounter: Payer: Self-pay | Admitting: Urology

## 2024-04-17 NOTE — Progress Notes (Signed)
" ° °  04/13/2024  CC: followup nephrolithiasis   HPI: Marcus Simpson is a 78yo here for ureteral stent removal Blood pressure 122/75, pulse 96. NED. A&Ox3.   No respiratory distress   Abd soft, NT, ND Normal phallus with bilateral descended testicles  Cystoscopy Procedure Note  Patient identification was confirmed, informed consent was obtained, and patient was prepped using Betadine solution.  Lidocaine  jelly was administered per urethral meatus.     Pre-Procedure: - Inspection reveals a normal caliber ureteral meatus.  Procedure: The flexible cystoscope was introduced without difficulty - No urethral strictures/lesions are present. - Enlarged prostate  - Normal bladder neck - Bilateral ureteral orifices identified - Bladder mucosa  reveals no ulcers, tumors, or lesions - No bladder stones - No trabeculation  Using a grasper the left ureteral stent was removed intact   Post-Procedure: - Patient tolerated the procedure well  Assessment/ Plan: Followup 1 month with a PSA  No follow-ups on file.  [name], MD  "

## 2024-04-17 NOTE — Patient Instructions (Signed)

## 2024-04-27 ENCOUNTER — Encounter: Payer: Self-pay | Admitting: *Deleted

## 2024-04-27 ENCOUNTER — Other Ambulatory Visit

## 2024-05-04 ENCOUNTER — Ambulatory Visit: Admitting: Urology

## 2024-06-14 ENCOUNTER — Inpatient Hospital Stay: Admitting: *Deleted
